# Patient Record
Sex: Female | Born: 1935
Health system: Southern US, Community
[De-identification: ages and names within clinical notes are randomized; demographics above are authoritative.]

## PROBLEM LIST (undated history)

## (undated) DIAGNOSIS — G709 Myoneural disorder, unspecified: Secondary | ICD-10-CM

## (undated) DIAGNOSIS — IMO0002 Reserved for concepts with insufficient information to code with codable children: Secondary | ICD-10-CM

## (undated) DIAGNOSIS — I10 Essential (primary) hypertension: Secondary | ICD-10-CM

## (undated) DIAGNOSIS — M109 Gout, unspecified: Secondary | ICD-10-CM

## (undated) DIAGNOSIS — Z0279 Encounter for issue of other medical certificate: Secondary | ICD-10-CM

## (undated) DIAGNOSIS — C4491 Basal cell carcinoma of skin, unspecified: Secondary | ICD-10-CM

## (undated) DIAGNOSIS — Z8742 Personal history of other diseases of the female genital tract: Secondary | ICD-10-CM

## (undated) DIAGNOSIS — K5792 Diverticulitis of intestine, part unspecified, without perforation or abscess without bleeding: Secondary | ICD-10-CM

## (undated) DIAGNOSIS — K8020 Calculus of gallbladder without cholecystitis without obstruction: Secondary | ICD-10-CM

## (undated) DIAGNOSIS — R319 Hematuria, unspecified: Secondary | ICD-10-CM

## (undated) DIAGNOSIS — H269 Unspecified cataract: Secondary | ICD-10-CM

## (undated) DIAGNOSIS — H9190 Unspecified hearing loss, unspecified ear: Secondary | ICD-10-CM

## (undated) DIAGNOSIS — C50412 Malignant neoplasm of upper-outer quadrant of left female breast: Secondary | ICD-10-CM

## (undated) DIAGNOSIS — E215 Disorder of parathyroid gland, unspecified: Secondary | ICD-10-CM

## (undated) DIAGNOSIS — M1990 Unspecified osteoarthritis, unspecified site: Secondary | ICD-10-CM

## (undated) DIAGNOSIS — M169 Osteoarthritis of hip, unspecified: Secondary | ICD-10-CM

## (undated) DIAGNOSIS — I340 Nonrheumatic mitral (valve) insufficiency: Secondary | ICD-10-CM

## (undated) DIAGNOSIS — I351 Nonrheumatic aortic (valve) insufficiency: Secondary | ICD-10-CM

## (undated) DIAGNOSIS — E559 Vitamin D deficiency, unspecified: Secondary | ICD-10-CM

## (undated) DIAGNOSIS — N814 Uterovaginal prolapse, unspecified: Secondary | ICD-10-CM

## (undated) DIAGNOSIS — Z9289 Personal history of other medical treatment: Secondary | ICD-10-CM

## (undated) DIAGNOSIS — K5730 Diverticulosis of large intestine without perforation or abscess without bleeding: Secondary | ICD-10-CM

## (undated) DIAGNOSIS — D126 Benign neoplasm of colon, unspecified: Secondary | ICD-10-CM

## (undated) DIAGNOSIS — R011 Cardiac murmur, unspecified: Secondary | ICD-10-CM

## (undated) DIAGNOSIS — T7840XA Allergy, unspecified, initial encounter: Secondary | ICD-10-CM

## (undated) DIAGNOSIS — I5022 Chronic systolic (congestive) heart failure: Secondary | ICD-10-CM

## (undated) DIAGNOSIS — N289 Disorder of kidney and ureter, unspecified: Secondary | ICD-10-CM

## (undated) DIAGNOSIS — IMO0001 Reserved for inherently not codable concepts without codable children: Secondary | ICD-10-CM

## (undated) HISTORY — DX: Reserved for concepts with insufficient information to code with codable children: IMO0002

## (undated) HISTORY — DX: Unspecified hearing loss, unspecified ear: H91.90

## (undated) HISTORY — DX: Uterovaginal prolapse, unspecified: N81.4

## (undated) HISTORY — DX: Encounter for issue of other medical certificate: Z02.79

## (undated) HISTORY — DX: Malignant neoplasm of upper-outer quadrant of left female breast: C50.412

## (undated) HISTORY — DX: Reserved for inherently not codable concepts without codable children: IMO0001

## (undated) HISTORY — DX: Diverticulitis of intestine, part unspecified, without perforation or abscess without bleeding: K57.92

## (undated) HISTORY — DX: Benign neoplasm of colon, unspecified: D12.6

## (undated) HISTORY — DX: Personal history of other medical treatment: Z92.89

## (undated) HISTORY — DX: Hematuria, unspecified: R31.9

## (undated) HISTORY — PX: DILATION AND CURETTAGE OF UTERUS: SHX78

## (undated) HISTORY — PX: TONSILLECTOMY AND ADENOIDECTOMY: SUR1326

## (undated) HISTORY — DX: Nonrheumatic aortic (valve) insufficiency: I35.1

## (undated) HISTORY — DX: Unspecified osteoarthritis, unspecified site: M19.90

## (undated) HISTORY — DX: Essential (primary) hypertension: I10

## (undated) HISTORY — DX: Calculus of gallbladder without cholecystitis without obstruction: K80.20

## (undated) HISTORY — PX: CHOLECYSTECTOMY: SHX55

## (undated) HISTORY — DX: Hypercalcemia: E83.52

## (undated) HISTORY — DX: Vitamin D deficiency, unspecified: E55.9

## (undated) HISTORY — DX: Disorder of kidney and ureter, unspecified: N28.9

## (undated) HISTORY — DX: Personal history of other diseases of the female genital tract: Z87.42

## (undated) HISTORY — DX: Allergy, unspecified, initial encounter: T78.40XA

## (undated) HISTORY — DX: Osteoarthritis of hip, unspecified: M16.9

## (undated) HISTORY — DX: Nonrheumatic mitral (valve) insufficiency: I34.0

## (undated) HISTORY — DX: Basal cell carcinoma of skin, unspecified: C44.91

## (undated) HISTORY — PX: TOTAL VAGINAL HYSTERECTOMY: SHX2548

## (undated) HISTORY — DX: Unspecified cataract: H26.9

## (undated) HISTORY — DX: Gout, unspecified: M10.9

## (undated) HISTORY — DX: Chronic systolic (congestive) heart failure: I50.22

## (undated) HISTORY — DX: Diverticulosis of large intestine without perforation or abscess without bleeding: K57.30

---

## 1969-10-20 DIAGNOSIS — Z9289 Personal history of other medical treatment: Secondary | ICD-10-CM

## 1969-10-20 HISTORY — DX: Personal history of other medical treatment: Z92.89

## 1999-03-22 ENCOUNTER — Other Ambulatory Visit: Admission: RE | Admit: 1999-03-22 | Discharge: 1999-03-22 | Payer: Self-pay | Admitting: *Deleted

## 1999-04-09 ENCOUNTER — Other Ambulatory Visit: Admission: RE | Admit: 1999-04-09 | Discharge: 1999-04-09 | Payer: Self-pay | Admitting: *Deleted

## 1999-04-09 ENCOUNTER — Encounter (INDEPENDENT_AMBULATORY_CARE_PROVIDER_SITE_OTHER): Payer: Self-pay | Admitting: Specialist

## 1999-04-30 ENCOUNTER — Encounter (INDEPENDENT_AMBULATORY_CARE_PROVIDER_SITE_OTHER): Payer: Self-pay

## 1999-04-30 ENCOUNTER — Other Ambulatory Visit: Admission: RE | Admit: 1999-04-30 | Discharge: 1999-04-30 | Payer: Self-pay | Admitting: *Deleted

## 1999-09-09 ENCOUNTER — Other Ambulatory Visit: Admission: RE | Admit: 1999-09-09 | Discharge: 1999-09-09 | Payer: Self-pay | Admitting: *Deleted

## 2000-03-04 ENCOUNTER — Other Ambulatory Visit: Admission: RE | Admit: 2000-03-04 | Discharge: 2000-03-04 | Payer: Self-pay | Admitting: *Deleted

## 2000-05-28 ENCOUNTER — Encounter: Payer: Self-pay | Admitting: General Surgery

## 2000-05-28 ENCOUNTER — Ambulatory Visit (HOSPITAL_COMMUNITY): Admission: RE | Admit: 2000-05-28 | Discharge: 2000-05-28 | Payer: Self-pay | Admitting: General Surgery

## 2000-06-08 ENCOUNTER — Encounter (INDEPENDENT_AMBULATORY_CARE_PROVIDER_SITE_OTHER): Payer: Self-pay | Admitting: Specialist

## 2000-06-08 ENCOUNTER — Ambulatory Visit (HOSPITAL_BASED_OUTPATIENT_CLINIC_OR_DEPARTMENT_OTHER): Admission: RE | Admit: 2000-06-08 | Discharge: 2000-06-08 | Payer: Self-pay | Admitting: General Surgery

## 2000-08-10 ENCOUNTER — Other Ambulatory Visit: Admission: RE | Admit: 2000-08-10 | Discharge: 2000-08-10 | Payer: Self-pay | Admitting: *Deleted

## 2001-08-12 ENCOUNTER — Other Ambulatory Visit: Admission: RE | Admit: 2001-08-12 | Discharge: 2001-08-12 | Payer: Self-pay | Admitting: *Deleted

## 2001-12-03 ENCOUNTER — Encounter: Payer: Self-pay | Admitting: General Surgery

## 2001-12-06 ENCOUNTER — Encounter (INDEPENDENT_AMBULATORY_CARE_PROVIDER_SITE_OTHER): Payer: Self-pay | Admitting: Specialist

## 2001-12-06 ENCOUNTER — Observation Stay (HOSPITAL_COMMUNITY): Admission: RE | Admit: 2001-12-06 | Discharge: 2001-12-06 | Payer: Self-pay | Admitting: General Surgery

## 2002-11-17 ENCOUNTER — Other Ambulatory Visit: Admission: RE | Admit: 2002-11-17 | Discharge: 2002-11-17 | Payer: Self-pay | Admitting: *Deleted

## 2003-10-18 ENCOUNTER — Encounter: Admission: RE | Admit: 2003-10-18 | Discharge: 2003-10-18 | Payer: Self-pay | Admitting: Family Medicine

## 2003-11-22 ENCOUNTER — Other Ambulatory Visit: Admission: RE | Admit: 2003-11-22 | Discharge: 2003-11-22 | Payer: Self-pay | Admitting: *Deleted

## 2004-12-05 ENCOUNTER — Other Ambulatory Visit: Admission: RE | Admit: 2004-12-05 | Discharge: 2004-12-05 | Payer: Self-pay | Admitting: *Deleted

## 2005-02-14 ENCOUNTER — Encounter: Admission: RE | Admit: 2005-02-14 | Discharge: 2005-02-14 | Payer: Self-pay | Admitting: Family Medicine

## 2005-12-11 ENCOUNTER — Other Ambulatory Visit: Admission: RE | Admit: 2005-12-11 | Discharge: 2005-12-11 | Payer: Self-pay | Admitting: Obstetrics & Gynecology

## 2007-02-11 ENCOUNTER — Other Ambulatory Visit: Admission: RE | Admit: 2007-02-11 | Discharge: 2007-02-11 | Payer: Self-pay | Admitting: Obstetrics & Gynecology

## 2007-03-29 ENCOUNTER — Ambulatory Visit (HOSPITAL_COMMUNITY): Admission: RE | Admit: 2007-03-29 | Discharge: 2007-03-30 | Payer: Self-pay | Admitting: Obstetrics & Gynecology

## 2007-03-29 ENCOUNTER — Encounter: Payer: Self-pay | Admitting: Obstetrics & Gynecology

## 2007-07-19 ENCOUNTER — Ambulatory Visit: Payer: Self-pay | Admitting: Internal Medicine

## 2007-07-30 ENCOUNTER — Ambulatory Visit: Payer: Self-pay | Admitting: Internal Medicine

## 2007-07-30 ENCOUNTER — Encounter: Payer: Self-pay | Admitting: Internal Medicine

## 2008-12-06 ENCOUNTER — Other Ambulatory Visit: Admission: RE | Admit: 2008-12-06 | Discharge: 2008-12-06 | Payer: Self-pay | Admitting: Radiology

## 2010-08-20 HISTORY — PX: CYSTOCELE REPAIR: SHX163

## 2010-09-17 ENCOUNTER — Observation Stay (HOSPITAL_COMMUNITY)
Admission: RE | Admit: 2010-09-17 | Discharge: 2010-09-18 | Payer: Self-pay | Source: Home / Self Care | Admitting: Urology

## 2010-12-31 LAB — BASIC METABOLIC PANEL
CO2: 26 mEq/L (ref 19–32)
Chloride: 107 mEq/L (ref 96–112)
Chloride: 109 mEq/L (ref 96–112)
GFR calc Af Amer: 55 mL/min — ABNORMAL LOW (ref >60)
GFR calc Af Amer: 56 mL/min — ABNORMAL LOW (ref >60)
Glucose, Bld: 103 mg/dL — ABNORMAL HIGH (ref 70–99)
Potassium: 4.3 mEq/L (ref 3.5–5.1)
Sodium: 141 mEq/L (ref 135–145)

## 2010-12-31 LAB — CBC
HCT: 46.1 % — ABNORMAL HIGH (ref 36.0–46.0)
Hemoglobin: 16.3 g/dL — ABNORMAL HIGH (ref 12.0–15.0)
MCHC: 35.3 g/dL (ref 30.0–36.0)
MCV: 88.8 fL (ref 78.0–100.0)
RDW: 13.3 % (ref 11.5–15.5)

## 2010-12-31 LAB — COMPREHENSIVE METABOLIC PANEL
ALT: 21 U/L (ref 0–35)
Albumin: 4 g/dL (ref 3.5–5.2)
Alkaline Phosphatase: 59 U/L (ref 39–117)
Chloride: 103 mEq/L (ref 96–112)
Glucose, Bld: 98 mg/dL (ref 70–99)
Potassium: 5.1 mEq/L (ref 3.5–5.1)
Sodium: 139 mEq/L (ref 135–145)
Total Bilirubin: 0.7 mg/dL (ref 0.3–1.2)
Total Protein: 7.6 g/dL (ref 6.0–8.3)

## 2010-12-31 LAB — SURGICAL PCR SCREEN: MRSA, PCR: NEGATIVE

## 2010-12-31 LAB — HEMOGLOBIN AND HEMATOCRIT, BLOOD
HCT: 37.8 % (ref 36.0–46.0)
HCT: 41.8 % (ref 36.0–46.0)
Hemoglobin: 13.1 g/dL (ref 12.0–15.0)

## 2010-12-31 LAB — PROTIME-INR: INR: 0.9 (ref 0.00–1.49)

## 2010-12-31 LAB — ABO/RH: ABO/RH(D): O POS

## 2011-03-04 LAB — BASIC METABOLIC PANEL
CO2: 32 mEq/L (ref 19–32)
Calcium: 11.4 mg/dL — ABNORMAL HIGH (ref 8.4–10.5)
Chloride: 101 mEq/L (ref 96–112)
GFR calc Af Amer: 51 mL/min — ABNORMAL LOW (ref >60)
Potassium: 5 mEq/L (ref 3.5–5.1)
Sodium: 140 mEq/L (ref 135–145)

## 2011-03-04 LAB — CBC
Hemoglobin: 15.2 g/dL — ABNORMAL HIGH (ref 12.0–15.0)
MCHC: 33.2 g/dL (ref 30.0–36.0)
RBC: 5.17 MIL/uL — ABNORMAL HIGH (ref 3.87–5.11)
WBC: 8.8 10*3/uL (ref 4.0–10.5)

## 2011-03-04 LAB — PROTIME-INR: INR: 0.9 (ref 0.00–1.49)

## 2011-03-04 LAB — APTT: aPTT: 34 seconds (ref 24–37)

## 2011-03-06 ENCOUNTER — Ambulatory Visit (HOSPITAL_BASED_OUTPATIENT_CLINIC_OR_DEPARTMENT_OTHER)
Admission: RE | Admit: 2011-03-06 | Discharge: 2011-03-06 | Disposition: A | Discharge status: Home / Self Care | Payer: Medicare Other | Source: Ambulatory Visit | Attending: Urology | Admitting: Urology

## 2011-03-06 DIAGNOSIS — Z01812 Encounter for preprocedural laboratory examination: Secondary | ICD-10-CM | POA: Insufficient documentation

## 2011-03-06 DIAGNOSIS — N393 Stress incontinence (female) (male): Secondary | ICD-10-CM | POA: Insufficient documentation

## 2011-03-06 DIAGNOSIS — Z79899 Other long term (current) drug therapy: Secondary | ICD-10-CM | POA: Insufficient documentation

## 2011-03-06 DIAGNOSIS — I10 Essential (primary) hypertension: Secondary | ICD-10-CM | POA: Insufficient documentation

## 2011-03-06 LAB — TYPE AND SCREEN: ABO/RH(D): O POS

## 2011-03-07 NOTE — Op Note (Signed)
NAME:  Carla Bennett, Carla Bennett            ACCOUNT NO.:  [id number]   MEDICAL RECORD NO.:  [id number]          PATIENT TYPE:  AMB   LOCATION:  SDC                           FACILITY:  [facility]   PHYSICIAN:  M. Edwinna Areola, MD  DATE OF BIRTH:  1934-12-30   DATE OF PROCEDURE:  03/29/2007  DATE OF DISCHARGE:                               OPERATIVE REPORT   PREOPERATIVE DIAGNOSES:  47. A 76 year old, gravida 25, para 46, married white female with      symptomatic uterine prolapse.  2. No significant cystocele or rectocele in clinic no stress urinary      incontinence history.  3. History of hypertension.  4. History of normal spontaneous vaginal delivery NSVD x4.  5. History of laparoscopic cholecystectomy in 2003.  6. Remote history of dilatation and curettage with transfusion x1 in      the 1970s.  7. Uterine fibroids.   POSTOPERATIVE DIAGNOSES:  51. A 76 year old, gravida 56, para 51, married white female with      symptomatic uterine prolapse.  2. No significant cystocele or rectocele in clinic no stress urinary      incontinence history.  3. History of hypertension.  4. History of normal spontaneous vaginal delivery NSVD x4.  5. History of laparoscopic cholecystectomy in 2003.  6. Remote history of dilatation and curettage with transfusion x1 in      the 1970s.  7. Uterine fibrosis.   DESCRIPTION OF PROCEDURE:  Transvaginal hysterectomy.   SURGEON:  M. Edwinna Areola, M.D.   ANESTHESIA:  General endotracheal.   SPECIMENS:  Uterus and cervix to pathology.   ESTIMATED BLOOD LOSS:  50 mL.   IV FLUIDS:  Fluids 49 mL of LR.   URINARY OUTPUT:  100 mL of clear urine drained with I&O catheterization  beginning at the end of the procedure.   COMPLICATIONS:  None.   INDICATIONS:  Mrs. Helvey was a very pleasant 76 year old G6, P4,  married white female who has had worsening pelvic prolapse over the last  2-3 years.  She is now feeling her uterus and vagina are regular basis.  On exam in  clinic, she did not have a significant cystocele or  rectocele, just basic uterine descensus.  The patient has no  incontinence symptoms at all.  Because of this I have decided to go  ahead and proceed with a transvaginal hysterectomy without any bladder  repair.  The patient requests if her ovaries with normal to leave the  place.  The patient has been consented in clinic.  Risks and benefits  were discussed with her and she has had all questions answered and we  are ready to proceed.   PROCEDURE:  Patient taken to the operating room.  She placed in supine  position.  Anesthesia is administered by the anesthesiologist without  difficulty.  Legs were positioned in the [place].  Care was taken  to ensure that there is no pressure on the outer portion of the legs.  Shoulder,  breast, and knees are in line. The SCDs are in placed  bilaterally on the lower extremities.  The abdomen, perineum,  inner  thighs, and vagina are prepped in a normal sterile fashion.  An in-and-  out Foley catheterization is performed to drain the bladder of all  urine.  The patient is then draped in a normal sterile fashion and the  legs are positioned in the high lithotomy position.   A heavy weighted speculum was placed in the vagina and cervix was  grasped with a Jacobson tenaculum.  The cervix with traction pulls out  to the level of the vagina.  Marcaine 0.5%, 10 mL, mixed with 1%  epinephrine (1:200,000 units) was instilled circumferentially around the  cervix.  The posterior cul-de-sac is entered sharply and the posterior  vaginal mucosa and posterior peritoneum are tacked with figure-of-eight  suture of #0 Vicryl.  The cervix is then circumferentially incised with  cautery.  The pubovesical cervical fascia anteriorly of the cervix is  then dissected sharply with Mayo scissors.  With an open Ray-Tec, the  bladder was advanced anteriorly up the cervix until the anterior  peritoneum is visualized.    At this point the uterosacral ligaments were clamped bilaterally with  Heaney clamps, transected and sutured with a Heaney stitch of #0 Vicryl.  The beginning of the cardinal ligaments are clamped serially with care  taken to ensure that the anterior peritoneum reflection was visualized.  These pedicles were transected, suture ligated with a 0 Vicryl.  Once  two clamps were placed on each side of the cervix, attention is turned  back the anterior peritoneum.  This was elevated with an Allis clamp and  incised sharply.  The Deaver retractor was placed through this incision.  The top the uterus is well visualized, as well as the bowel.  At this  point, the remainder of the cardinal ligaments were serially clamped,  transected, suture ligated with 0 Vicryl.  Once the uterine artery was  clamped on each side, the pedicles were transected and then doubly  suture ligated with 0 Vicryl.  At this point, the posterior aspect of  the uterine fundus could be grasped with single-tooth tenaculum and  delivered through the posterior aspect of the vaginal opening.  The  uterine ovarian pedicles were then clamped with curved Heaney clamp.  Care was taken to sure no bowel was in this clamp.  This was done  bilaterally without difficulty.  Pedicles were transected.  The uterus  and cervix were delivered through the vagina and handed off to be sent  to pathology.  The utero-ovarian pedicles were then suture ligated with  a stitch tie of #0 Vicryl.  Pedicles were hemostatic.  The ovaries were  visualized.  They were high and atrophic-appearing.  The patient wished  to keep them if possible and so we did not do an oophorectomy at this  point.  The pedicles had been previously transected and sutured and  visualized.  No bleeding was noted.  A moistened open lap tape was  placed in the vaginal opening, pushed the bowel anteriorly.  At this  point the cuff was run using a long number0Vicryl.  This stitch was   started at the 2 o'clock position on the vaginal mucosa.  The anterior  vaginal mucosa and anterior peritoneum were incorporated into the  stitch.  A running interlocking stitch was used.  The stitch was brought  around to the posterior aspect of the vaginal mucosa and the vaginal  mucosa and vaginal peritoneum were incorporated and this stitch was  carried along back to about the 10 o'clock  position on the cervix.  This  was tied tightly.   At this point, am enterocele stitch was placed.  This stich was placed  through the posterior vaginal mucosa.  The medial third of the  uterosacral ligament on the left side was incorporated and the posterior  peritoneal tissue was also incorporated by reefing across.  The medial  third of the right uterosacral ligament was incorporated into the  stitch.  The stitch was brought back to the vaginal mucosa and tied  tightly bring uterosacral ligaments and the vaginal tissue together.  Then the vaginal cuff was closed with three interrupted figure-of-eight  sutures of #0 Vicryl.  The tissue was irrigated.  No bleeding was noted.  The in-and-out Foley catheterization was drained used to drain the  bladder of all urine at this point.  This urine was clear.   Sponge, lap, needle and instrument counts were correct x2.  The patient  tolerated the procedure well.  Her legs were brought back to the low  lithotomy position and she was brought to the supine position before  being awakened from anesthesia.  She was extubated without difficulty  and taken to the recovery room in stable condition.      Felipa Emory, MD  Electronically Signed     MSM/MEDQ  D:  03/29/2007  T:  03/29/2007  Job:  [id number]

## 2011-03-07 NOTE — Op Note (Signed)
[facility]  Patient:    Carla Bennett, Carla Bennett Visit Number: [id number] MRN: [id number]          Service Type: DSU Location: DAY Attending Physician:  Barbera Setters Proc. Date: 12/06/01 Admit Date:  12/06/2001   CC:         Russ Halo. Sheryn Bison, M.D.   Operative Report  PREOPERATIVE DIAGNOSIS:  Chronic cholecystitis with cholelithiasis.  POSTOPERATIVE DIAGNOSIS:  Chronic cholecystitis with cholelithiasis.  PROCEDURE PERFORMED:  Laparoscopic cholecystectomy.  SURGEON:  Edsel Petrin. Dalbert Batman, M.D.  ASSISTANT:  Shellia Carwin, M.D.  INDICATION:  This is a 76 year old white female in good health.  She has had a two or three year history of intermittent episodes of epigastric pain, back pain, nausea and vomiting.  This is becoming more frequent now.  The episodes tend to occur after meals.  She is on Protonix now and the symptoms are a little bit better, but she continues to have daily epigastric discomfort, back discomfort, and bloating.  A gallbladder ultrasound shows multiple gallstones. The gallbladder wall was not thickened, and the common bile duct measures only 3.7 mm.  Liver function tests are normal.  She is brought to the operating room electively for cholecystectomy.  FINDINGS:  The gallbladder was chronically inflamed and somewhat discolored, but thin walled.  The anatomy of the cystic duct, cystic artery, and common bile duct appeared conventional.  The liver, stomach, duodenum, small intestine, large intestine, and peritoneal surfaces looked normal.  DESCRIPTION OF PROCEDURE:  Following the induction of general endotracheal anesthesia, the patients abdomen was prepped and draped in a sterile fashion. Marcaine 0.5% with epinephrine was used as a local infiltration anesthetic.  A vertically oriented incision was made inside the lower rim of the umbilicus. The fascia was incised in the midline and the abdominal cavity entered  under direct vision.  A 10 mm Hasson trocar was inserted and secured with a pursestring suture of 0 Vicryl.  Pneumoperitoneum was created.  A video camera was inserted with visualization and findings as described above.  The 10 mm trocar was placed in the subxiphoid region and two 5 mm trocars were placed in the right mid abdomen.  The gallbladder was elevated and placed on traction. Adhesions were taken down.  We carefully dissected out the cystic duct and the cystic artery.  The cystic artery was isolated as it went on to the gallbladder wall, secured with metal clips and divided.  A large window was created behind the cystic duct until we could clearly identify the junction of the cystic duct with the infundibulum of the gallbladder and clearly identified the region of the common bile duct.  The cystic duct was tiny in caliber, was secured with metal clips, and divided.  The gallbladder was then dissected from its bed with electrocautery.  That dissection was uneventful. Hemostasis was excellent and achieved with electrocautery.  The gallbladder was removed through the umbilical port.  The operative field and the subphrenic space were irrigated with saline.  The irrigation fluid was completely clear at the completion of the case, and there was no bleeding, and no bile leak whatsoever.  The trocars were removed under direct vision and there was no bleeding from the trocar sites.  The pneumoperitoneum was released.  The fascia at the umbilicus was closed with 0 Vicryl suture.  The skin incisions were closed with subcuticular sutures of 4-0 Vicryl and Steri-Strips.  Clean bandages were placed and the patient was taken to the recovery  room in stable condition.  Estimated blood loss was about 10 cc. Complications none.  Sponge, needle, and instrument counts were correct. Attending Physician:  Barbera Setters DD:  12/06/01 TD:  12/06/01 Job: [id number]

## 2011-03-07 NOTE — Op Note (Signed)
[facility]  Patient:    Carla Bennett, Carla Bennett                   MRN: [id number] Proc. Date: 06/08/00 Adm. Date:  [id number] Attending:  Barbera Setters CC:         Russ Halo. Sheryn Bison, M.D.                           Operative Report  PREOPERATIVE DIAGNOSIS:  Chronically infected apocrine gland cyst, right groin.  POSTOPERATIVE DIAGNOSIS:  Chronically infected apocrine gland cyst, right groin.  OPERATION PERFORMED:  Excision of chronically inflamed cyst, right groin/perineum.  SURGEON:  Edsel Petrin. Dalbert Batman, M.D.  ANESTHESIA:  INDICATIONS FOR PROCEDURE:  This is a 76 year old white female with a two-year history of intermittent swelling, pain and drainage in her right groin.  She has not had any prior problems.  She is not diabetic.  This is beginning to interfere with her work and her sporting activities with bowling.  Examination reveals in the inner crease of the thigh lateral to the labia majora there is a chronically infected cyst.  There is a chronic dimpled scar and there is also a chronically inflamed ridge just inferior to this.  It is thickened in this area but there is not active drainage and there is not active cellulitis.   DESCRIPTION OF PROCEDURE:  The patient was brought to the operating room.  She was monitored and heavily sedated by the anesthesia department.  She was placed in the modified dorsal lithotomy position.  The right groin was prepped and draped in sterile fashion.  I basically performed a narrow, elliptical incision in the right inner thigh crease between the thigh and the labia majora.  Dissection was carried down into the deep subcutaneous tissues where we excised all the chronically inflamed and thickened tissue.  There was no abscess and no purulence.  The tissue was sent for routine histology. Hemostasis was excellent and achieved with electrocautery.  The wound was irrigated with saline.  The skin was  closed with running subcuticular suture of 4-0 Vicryl and Steri-Strips.  Clean bandage and ice pack were placed.  The patient tolerated the procedure well and was taken to the recovery room in stable condition.  Estimated blood loss was about 10 cc.  Complications were none.  Sponge, needle and instrument counts were correct. DD:  06/08/00 TD:  06/08/00 Job: [id number]

## 2011-03-07 NOTE — Discharge Summary (Signed)
NAME:  Carla Bennett, Carla Bennett            ACCOUNT NO.:  [id number]   MEDICAL RECORD NO.:  [id number]          PATIENT TYPE:  OIB   LOCATION:  9317                          FACILITY:  [facility]   PHYSICIAN:  M. Edwinna Areola, MD  DATE OF BIRTH:  1934-10-25   DATE OF ADMISSION:  03/29/2007  DATE OF DISCHARGE:  03/30/2007                               DISCHARGE SUMMARY   PROCEDURE:  Transvaginal hysterectomy.   ADMISSION DIAGNOSES:  76. 76 year old G6, P 4 married white female with symptomatic uterine      prolapse.  2. History of hypertension.  3. History of normal spontaneous vaginal delivery x4.  4. History of laparoscopic cholecystectomy in 2003.  5. History D&C with transfusion in the 1970s due to bleeding.  6. Small uterine fibroids.   DISCHARGE DIAGNOSES:  54. 76 year old G6, P 4 married white female with symptomatic uterine      prolapse.  2. History of hypertension.  3. History of normal spontaneous vaginal delivery x4.  4. History of laparoscopic cholecystectomy in 2003.  5. History D&C with transfusion in the 1970s due to bleeding.  6. Small uterine fibroids.   HISTORY OF PRESENT ILLNESS:  Written H&P is in the chart but in brief  Carla Bennett is very pleasant 76 year old G6 P4 married white female who  has had worsening pelvic prolapse over the last several years.  She has  constant feeling of her uterus in the vagina and feeling of vaginal and  bladder pressure.  On exam while awake.  She does not have a significant  cystocele or rectocele and just uterine descensus.  She can cough with  the base of cervix just inside the vaginal opening on the exam.  She had  discussed conservative and surgical options and she has decided to go  ahead and proceed with transvaginal hysterectomy.  Because she does not  have any incontinence and she does not have any significant cystocele on  physical exam in clinic or with Valsalva maneuver, she and I have  decided not to do anything else in  regards to her bladder.  She is also  interested in keeping her ovaries.   HOSPITAL COURSE:  The patient was admitted through same-day surgery  where she was taken to the operating room.  TVH was performed without  difficulty.  The patient had 50 mL of blood loss during the procedure  and she made appropriate urine during the procedure.  She was taken from  the OR after the procedure ended to the recovery room and after the time  in recovery room to the third floor where she remained for the rest of  postoperative time period. In the evening of postoperative day 0 the  patient was doing very, very well.  She was ambulating.  She already  voided, she was eating regular food.  She had stable vital signs. We  discussed the possibility of going home but because she is 76 I felt it  prudent to keep her overnight and do blood work in the morning.  She had  an uneventful evening and in the morning of postoperative day #1  she had  passed some flatus.  She was tolerating pain medicines by mouth.  She  had no trouble with breakfast.  Her hemoglobin postoperative was 14.2.  White count was 13.4, platelet count 337. Electrolytes were normal.  Her  sodium was 139, potassium was 4.1, glucose 121, BUN and creatinine were  7 and 1.0. These actually very stable from before the surgery with a GFR  of 54.  Again this was stable as her preoperative GFR had been 48.   As the patient was doing very well I felt discharge was appropriate.   DISCHARGE INSTRUCTIONS:  The instructions were provided in the written  and verbal form.  The patient be seen in 1 week in my clinic. She has  been given postop prescription for Percocet 5/325 to be taken 1-2  tablets p.o. q.4-6 hours p.r.n. and Motrin 800 mg be taken every 8 hours  as needed.  She is not to do any heavy lifting over the next 6 weeks.  She has been given very specific instructions what she can and cannot  do.  The patient was discharged to home with her  husband in stable  condition.      Carla Emory, MD  Electronically Signed     MSM/MEDQ  D:  04/27/2007  T:  04/27/2007  Job:  [id number]

## 2011-03-17 NOTE — Op Note (Signed)
NAME:  Carla Bennett, Carla Bennett            ACCOUNT NO.:  [id number]  MEDICAL RECORD NO.:  [id number]          PATIENT TYPE:  OBV  LOCATION:  54                         FACILITY:  [facility]  PHYSICIAN:  Reece Packer, MD DATE OF BIRTH:  02-20-1935  DATE OF PROCEDURE:  03/06/2011 DATE OF DISCHARGE:                              OPERATIVE REPORT   PREOPERATIVE DIAGNOSIS:  Stress urinary incontinence.  POSTOPERATIVE DIAGNOSIS:  Stress urinary incontinence.  PROCEDURE:  Sling, cystourethropexy [facility]) plus cystoscopy.  INDICATIONS FOR PROCEDURE:  Ms. Ercilia Resch had a successful prolapse repair with cystocele repair and rectocele repair.  She had postoperative stress incontinence and consented with the above procedure.  PROCEDURE IN DETAIL:  The patient was prepped and draped in the usual fashion.  Extra care was taken with leg positioning to minimize the risk of compartment syndrome, neuropathy and DVT.  Preoperative antibiotics were given.  Preoperative laboratory tests were normal.  She had a grade 1 cystocele under anesthesia with good vaginal length and good support posteriorly.  I made two less than 1-cm incisions one fingerbreadth above the symphysis pubis 1.5 cm lateral to the midline.  I made a 2-cm suburethral incision of appropriate depth after instilling 7 cc of a lidocaine/epinephrine mixture.  I sharply dissected to the urethrovesical angle bilaterally.  She had two large folds of tissue almost like camel humps just to the left and right of the midline incision.  It made visibility a little bit more difficult.  They did not represent urethral diverticula, in my opinion.  After this dissection, I had an extra look at the urethra and the urethra was healthy and the incision was appropriate depth.  With the bladder emptied, I passed a SPARC needle along the top and along the back of the symphysis pubis, staying laterally with my box technique, turning the trocar under  the pulp of my index finger bilaterally.  I cystoscoped the patient at this point.  There was no bladder or urethral injury.  I had a good look in the urethra as well. There was excellent blue jets bilaterally.  There was no movement of the bladder with movement of the trocar.  When the bladder was empty, I attached a Henderson sling and brought it up through the retropubic space and tensioned it over the fat part of a moderate-sized Kelly clamp.  I cut below the blue dots, irrigated the sheath and removed the sheath.  There was appropriate hypermobility of the sling.  There was no springback effect.  I was very happy with the position and the tension of the sling.  After irrigating all incisions, I closed the anterior vaginal wall with 2-0 Vicryl on a CT-1 needle, being meticulous because of the redundant vaginal mucosa.  I did my two interrupted sutures as well.  I cut the sling below the skin and closed the two abdominal incisions with interrupted 4-0 Vicryl followed by Dermabond.  Leg positioning was good at the end of the case.  Blue urine was draining from her bladder.  Hopefully she will reach her treatment goal.          ______________________________ Elayne Snare  Yanelli Zapanta, MD     SAM/MEDQ  D:  03/06/2011  T:  03/06/2011  Job:  [id number]  Electronically Signed by Bjorn Loser MD on 03/17/2011 06:13:25 PM

## 2011-08-07 LAB — CBC
Hemoglobin: 16.8 — ABNORMAL HIGH
MCHC: 33.7
MCHC: 33.7
MCV: 88.1
Platelets: 337
RBC: 5.64 — ABNORMAL HIGH
RDW: 12.3

## 2011-08-07 LAB — BASIC METABOLIC PANEL
CO2: 27
CO2: 29
Calcium: 9.5
Chloride: 104
Creatinine, Ser: 1.01
Creatinine, Ser: 1.13
GFR calc Af Amer: 58 — ABNORMAL LOW
GFR calc Af Amer: >60
Glucose, Bld: 121 — ABNORMAL HIGH

## 2011-12-16 DIAGNOSIS — N6009 Solitary cyst of unspecified breast: Secondary | ICD-10-CM | POA: Diagnosis not present

## 2011-12-17 DIAGNOSIS — I10 Essential (primary) hypertension: Secondary | ICD-10-CM | POA: Diagnosis not present

## 2011-12-17 DIAGNOSIS — E210 Primary hyperparathyroidism: Secondary | ICD-10-CM | POA: Diagnosis not present

## 2011-12-17 DIAGNOSIS — E559 Vitamin D deficiency, unspecified: Secondary | ICD-10-CM | POA: Diagnosis not present

## 2011-12-17 DIAGNOSIS — J309 Allergic rhinitis, unspecified: Secondary | ICD-10-CM | POA: Diagnosis not present

## 2012-01-26 DIAGNOSIS — E559 Vitamin D deficiency, unspecified: Secondary | ICD-10-CM | POA: Diagnosis not present

## 2012-01-26 DIAGNOSIS — Z0000 Encounter for general adult medical examination without abnormal findings: Secondary | ICD-10-CM | POA: Diagnosis not present

## 2012-01-26 DIAGNOSIS — E669 Obesity, unspecified: Secondary | ICD-10-CM | POA: Diagnosis not present

## 2012-01-26 DIAGNOSIS — I10 Essential (primary) hypertension: Secondary | ICD-10-CM | POA: Diagnosis not present

## 2012-02-04 DIAGNOSIS — J309 Allergic rhinitis, unspecified: Secondary | ICD-10-CM | POA: Diagnosis not present

## 2012-02-04 DIAGNOSIS — I10 Essential (primary) hypertension: Secondary | ICD-10-CM | POA: Diagnosis not present

## 2012-02-04 DIAGNOSIS — E559 Vitamin D deficiency, unspecified: Secondary | ICD-10-CM | POA: Diagnosis not present

## 2012-03-18 DIAGNOSIS — E210 Primary hyperparathyroidism: Secondary | ICD-10-CM | POA: Diagnosis not present

## 2012-03-30 DIAGNOSIS — I10 Essential (primary) hypertension: Secondary | ICD-10-CM | POA: Diagnosis not present

## 2012-03-30 DIAGNOSIS — E210 Primary hyperparathyroidism: Secondary | ICD-10-CM | POA: Diagnosis not present

## 2012-06-02 DIAGNOSIS — D235 Other benign neoplasm of skin of trunk: Secondary | ICD-10-CM | POA: Diagnosis not present

## 2012-06-02 DIAGNOSIS — L719 Rosacea, unspecified: Secondary | ICD-10-CM | POA: Diagnosis not present

## 2012-06-08 ENCOUNTER — Encounter: Payer: Self-pay | Admitting: Internal Medicine

## 2012-06-27 DIAGNOSIS — M109 Gout, unspecified: Secondary | ICD-10-CM | POA: Diagnosis not present

## 2012-08-03 DIAGNOSIS — H1044 Vernal conjunctivitis: Secondary | ICD-10-CM | POA: Diagnosis not present

## 2012-08-05 DIAGNOSIS — I10 Essential (primary) hypertension: Secondary | ICD-10-CM | POA: Diagnosis not present

## 2012-08-05 DIAGNOSIS — E559 Vitamin D deficiency, unspecified: Secondary | ICD-10-CM | POA: Diagnosis not present

## 2012-08-12 DIAGNOSIS — J309 Allergic rhinitis, unspecified: Secondary | ICD-10-CM | POA: Diagnosis not present

## 2012-08-12 DIAGNOSIS — Z01419 Encounter for gynecological examination (general) (routine) without abnormal findings: Secondary | ICD-10-CM | POA: Diagnosis not present

## 2012-08-12 DIAGNOSIS — Z124 Encounter for screening for malignant neoplasm of cervix: Secondary | ICD-10-CM | POA: Diagnosis not present

## 2012-08-12 DIAGNOSIS — M19049 Primary osteoarthritis, unspecified hand: Secondary | ICD-10-CM | POA: Diagnosis not present

## 2012-08-12 DIAGNOSIS — I10 Essential (primary) hypertension: Secondary | ICD-10-CM | POA: Diagnosis not present

## 2012-10-10 DIAGNOSIS — IMO0002 Reserved for concepts with insufficient information to code with codable children: Secondary | ICD-10-CM | POA: Diagnosis not present

## 2012-12-27 DIAGNOSIS — K5732 Diverticulitis of large intestine without perforation or abscess without bleeding: Secondary | ICD-10-CM | POA: Diagnosis not present

## 2013-01-05 ENCOUNTER — Telehealth: Payer: Self-pay | Admitting: Internal Medicine

## 2013-01-05 NOTE — Telephone Encounter (Signed)
Left a message for patient to call me. 

## 2013-01-05 NOTE — Telephone Encounter (Signed)
Patient states she had diverticulitis on 12/28/12 and was put on antibiotics. She states her stools are still soft. She reports she is due for colonoscopy also. Wants to be seen. Scheduled with Tye Savoy, NP on 01/06/13 at 11:00 AM.

## 2013-01-05 NOTE — Telephone Encounter (Signed)
Left a message with husband for patient to call me.

## 2013-01-06 ENCOUNTER — Ambulatory Visit (INDEPENDENT_AMBULATORY_CARE_PROVIDER_SITE_OTHER): Payer: Medicare Other | Admitting: Nurse Practitioner

## 2013-01-06 ENCOUNTER — Encounter: Payer: Self-pay | Admitting: *Deleted

## 2013-01-06 VITALS — BP 130/74 | HR 80 | Ht 60.05 in | Wt 161.0 lb

## 2013-01-06 DIAGNOSIS — K921 Melena: Secondary | ICD-10-CM

## 2013-01-06 DIAGNOSIS — R109 Unspecified abdominal pain: Secondary | ICD-10-CM | POA: Diagnosis not present

## 2013-01-06 MED ORDER — MOVIPREP 100 G PO SOLR: 1.0000 | Freq: Once | ORAL | Status: DC

## 2013-01-06 NOTE — Progress Notes (Signed)
HPI :  Patient is a 78 year old female known remotely to Dr. Olevia Perches for screening colonoscopy done October 2008. Findings included diverticulosis and a colon polyp. Patient is here today for evaluation of recent abdominal pain and hematochezia.  Ms. Gaut went to urgent care 12/27/12 with a one day history of diffuse lower abdominal pain, predominantly LLQ, and hematochezia. The pain started first followed by onset of hematochezia several hours later. She had associated chills. Urgent Care gave her Cipro / Flagyl which she took for 7 days for presumed diverticulitis. Blood work was taken but I don't yet have results. Bleeding continued for a couple of days . She averaged about 6 bloody BMs a day. Of note, patient had been significantly constipated two days prior to onset of symptoms. She didn't take anything for constipation. Patient has been feeling fine for a few days now except that stools are mushy. She is having 3-4 mushy BMs / a day. Her normal BMs consist of solid stool once daily. Her appetite is fine. She hasn't started any new medications (none that predate recent symptoms). Of note, patient reports a similar episode approximately 4 years ago for which she was never treated.  Patient was due for surveillance colonoscopy last August.   Past Medical History  Diagnosis Date  . Diverticulosis of colon (without mention of hemorrhage)   . Benign neoplasm of colon   . Diverticulosis   . Arthritis   . Gout   . Hypertension    Family History  Problem Relation Age of Onset  . Breast cancer Sister   . Lung cancer Father   . Parkinson's disease Mother   . Multiple sclerosis Sister    History  Substance Use Topics  . Smoking status: Never Smoker   . Smokeless tobacco: Never Used  . Alcohol Use: Yes     Comment: occasionally   Current Outpatient Prescriptions  Medication Sig Dispense Refill  . estrogens, conjugated, (PREMARIN) 0.3 MG tablet Take 0.3 mg by mouth daily. Take daily for 21  days then do not take for 7 days.      Marland Kitchen ibuprofen (ADVIL,MOTRIN) 200 MG tablet Take 200 mg by mouth every 6 (six) hours as needed for pain.      Marland Kitchen lisinopril (PRINIVIL,ZESTRIL) 40 MG tablet Take 40 mg by mouth daily.      Marland Kitchen loratadine (CLARITIN) 10 MG tablet Take 10 mg by mouth daily.      . Meloxicam (MOBIC PO) Take 1 tablet by mouth as needed.      . triamterene (DYRENIUM) 50 MG capsule Take 50 mg by mouth daily.       No current facility-administered medications for this visit.   No Known Allergies   Review of Systems: All systems reviewed and negative except where noted in HPI.    Physical Exam: BP 130/74  Pulse 80  Ht 5' 0.05" (1.525 m)  Wt 161 lb (73.029 kg)  BMI 31.4 kg/m2 Constitutional: Well-developed, white female in no acute distress. HEENT: Normocephalic and atraumatic. Conjunctivae are normal. No scleral icterus. Neck supple. Trachea midline. Cardiovascular: Normal rate, regular rhythm Pulmonary/chest: Effort normal and breath sounds normal. No wheezing, rales or rhonchi. Abdominal: Soft, nontender, nondistended. Bowel sounds active throughout. There are no masses palpable. No hepatomegaly. Rectal: no external hemorrhoids. Scant light brown stool in vault, heme negative.  Extremities: no edema Lymphadenopathy: No cervical adenopathy noted. Neurological: Alert and oriented to person place and time. Skin: Skin is warm and dry. No rashes noted. Psychiatric: Normal  mood and affect. Behavior is normal.   ASSESSMENT AND PLAN:  1. Lower abdominal pain, hematochezia. Patient treated by urgent care for presumed diverticulitis. She completed antibiotics, has felt fine for the last several days. Patient may have had diverticulitis the bleeding is unusual. I think ischemic colitis is definitely in the list of differential diagnoses. We discussed some risk factors. Patient does take an NSAID. In addition, she is on Premarin which could possibly be a risk factor even though it  isn't an oral contraceptive.   2 History of adenomatous polyps. Patient overdue for surveillance colonoscopy. Will get that scheduled for her. The risks, benefits, and alternatives to colonoscopy with possible biopsy and possible polypectomy were discussed with the patient and she consents to proceed.

## 2013-01-06 NOTE — Patient Instructions (Addendum)
You have been scheduled for a colonoscopy with propofol. Please follow written instructions given to you at your visit today.  We sent the prescription for the colonoscopy prep to [facility], [place].  Please pick up your prep kit at the pharmacy within the next 1-3 days. If you use inhalers (even only as needed), please bring them with you on the day of your procedure.

## 2013-01-07 ENCOUNTER — Encounter: Payer: Self-pay | Admitting: Nurse Practitioner

## 2013-01-07 ENCOUNTER — Telehealth: Payer: Self-pay | Admitting: Nurse Practitioner

## 2013-01-07 NOTE — Telephone Encounter (Signed)
Spoke to the pharmacist, [facility], [address].  He said it is illegal for [place] to tell the patient to have the pharmacy run the RX script for [employer] and not run it under the Medicare ins.  I explained to him that we were never told this by the rep.  We are asking this be run cash and she gave the pharmacist a free voucher and he would not do it.  I told her to take the coupon back from him and come here Monday, I have a free prep at the front desk for her.  I will address this later with the rep for Moviprep.

## 2013-01-07 NOTE — Progress Notes (Signed)
Reviewed and agree, s/p colitis either ischemic or diverticulitis related

## 2013-01-18 ENCOUNTER — Encounter: Payer: Self-pay | Admitting: Internal Medicine

## 2013-01-18 ENCOUNTER — Ambulatory Visit (AMBULATORY_SURGERY_CENTER): Payer: Medicare Other | Admitting: Internal Medicine

## 2013-01-18 VITALS — BP 139/56 | HR 74 | Temp 97.2°F | Resp 25 | Ht 60.0 in | Wt 161.0 lb

## 2013-01-18 DIAGNOSIS — K921 Melena: Secondary | ICD-10-CM | POA: Diagnosis not present

## 2013-01-18 DIAGNOSIS — D126 Benign neoplasm of colon, unspecified: Secondary | ICD-10-CM

## 2013-01-18 DIAGNOSIS — I10 Essential (primary) hypertension: Secondary | ICD-10-CM | POA: Diagnosis not present

## 2013-01-18 DIAGNOSIS — R109 Unspecified abdominal pain: Secondary | ICD-10-CM | POA: Diagnosis not present

## 2013-01-18 HISTORY — PX: COLONOSCOPY: SHX174

## 2013-01-18 MED ORDER — SODIUM CHLORIDE 0.9 % IV SOLN: 500.0000 mL | INTRAVENOUS | Status: DC

## 2013-01-18 NOTE — Progress Notes (Signed)
YOU HAD AN ENDOSCOPIC PROCEDURE TODAY AT [facility]: Refer to the procedure report that was given to you for any specific questions about what was found during the examination.  If the procedure report does not answer your questions, please call your gastroenterologist to clarify.  If you requested that your care partner not be given the details of your procedure findings, then the procedure report has been included in a sealed envelope for you to review at your convenience later.  YOU SHOULD EXPECT: Some feelings of bloating in the abdomen. Passage of more gas than usual.  Walking can help get rid of the air that was put into your GI tract during the procedure and reduce the bloating. If you had a lower endoscopy (such as a colonoscopy or flexible sigmoidoscopy) you may notice spotting of blood in your stool or on the toilet paper. If you underwent a bowel prep for your procedure, then you may not have a normal bowel movement for a few days.  DIET: Your first meal following the procedure should be a light meal and then it is ok to progress to your normal diet.  A half-sandwich or bowl of soup is an example of a good first meal.  Heavy or fried foods are harder to digest and may make you feel nauseous or bloated.  Likewise meals heavy in dairy and vegetables can cause extra gas to form and this can also increase the bloating.  Drink plenty of fluids but you should avoid alcoholic beverages for 24 hours.  TRY TO EAT MORE FIBER IN YOUR DIET TO PREVENT DIVERTICULITIS.  USE METAMUCIL ONCE DAILY.  ACTIVITY: Your care partner should take you home directly after the procedure.  You should plan to take it easy, moving slowly for the rest of the day.  You can resume normal activity the day after the procedure however you should NOT DRIVE or use heavy machinery for 24 hours (because of the sedation medicines used during the test).    THANK-YOU FOR CHOOSING [place] FOR YOUR HEALTHCARE NEEDS.  SYMPTOMS TO  REPORT IMMEDIATELY: A gastroenterologist can be reached at any hour.  During normal business hours, 8:30 AM to 5:00 PM Monday through Friday, call [phone].  After hours and on weekends, please call the GI answering service at [phone] who will take a message and have the physician on call contact you.   Following lower endoscopy (colonoscopy or flexible sigmoidoscopy):  Excessive amounts of blood in the stool  Significant tenderness or worsening of abdominal pains  Swelling of the abdomen that is new, acute  Fever of 100F or higher   FOLLOW UP: If any biopsies were taken you will be contacted by phone or by letter within the next 1-3 weeks.  Call your gastroenterologist if you have not heard about the biopsies in 3 weeks.  Our staff will call the home number listed on your records the next business day following your procedure to check on you and address any questions or concerns that you may have at that time regarding the information given to you following your procedure. This is a courtesy call and so if there is no answer at the home number and we have not heard from you through the emergency physician on call, we will assume that you have returned to your regular daily activities without incident.  SIGNATURES/CONFIDENTIALITY: You and/or your care partner have signed paperwork which will be entered into your electronic medical record.  These signatures attest  to the fact that that the information above on your After Visit Summary has been reviewed and is understood.  Full responsibility of the confidentiality of this discharge information lies with you and/or your care-partner. 

## 2013-01-18 NOTE — Op Note (Signed)
[facility]  [address] [place], [address]   COLONOSCOPY PROCEDURE REPORT  PATIENT: Bennett, Carla Ennes  MR#: [id number] BIRTHDATE: Jun 12, 1935 , 75  yrs. old GENDER: Female ENDOSCOPIST: Lafayette Dragon, MD REFERRED BY:  Thressa Sheller, M.D. PROCEDURE DATE:  01/18/2013 PROCEDURE:   Colonoscopy with cold biopsy polypectomy ASA CLASS:   Class II INDICATIONS:Average risk patient for colon cancer and adenomatous polyp 2008, s/p acute episode of LLQ abd.  pain with blleeding, known diverticulosis. MEDICATIONS: MAC sedation, administered by CRNA and propofol (Diprivan) 200mg  IV  DESCRIPTION OF PROCEDURE:   After the risks and benefits and of the procedure were explained, informed consent was obtained.  A digital rectal exam revealed no abnormalities of the rectum.    The LB CF-[id number]  endoscope was introduced through the anus and advanced to the cecum, which was identified by both the appendix and ileocecal valve .  The quality of the prep was good, using MoviPrep .  The instrument was then slowly withdrawn as the colon was fully examined.     COLON FINDINGS: A diminutive firm sessile polyp was found in the ascending colon.  A polypectomy was performed with cold forceps. The resection was complete and the polyp tissue was completely retrieved.   There was moderate diverticulosis noted in the sigmoid colon and throughout the entire examined colon with associated tortuosity, muscular hypertrophy and luminal narrowing. Retroflexed views revealed no abnormalities.     The scope was then withdrawn from the patient and the procedure completed.  COMPLICATIONS: There were no complications. ENDOSCOPIC IMPRESSION: 1.   Diminutive sessile polyp was found in the ascending colon; polypectomy was performed with cold forceps 2.   There was moderate diverticulosis noted in the sigmoid colon and throughout the entire examined colon  RECOMMENDATIONS: 1.  Await  biopsy results 2.  High fiber diet 3. Metamucil 1 table spoon daily   REPEAT EXAM: for No recall due to age..  cc:  _______________________________ eSignedLafayette Dragon, MD 01/18/2013 4:01 PM     PATIENT NAME:  Carla Bennett, Carla Bennett MR#: [id number]

## 2013-01-18 NOTE — Patient Instructions (Signed)
YOU HAD AN ENDOSCOPIC PROCEDURE TODAY AT THE [facility]: Refer to the procedure report that was given to you for any specific questions about what was found during the examination.  If the procedure report does not answer your questions, please call your gastroenterologist to clarify.  If you requested that your care partner not be given the details of your procedure findings, then the procedure report has been included in a sealed envelope for you to review at your convenience later.  YOU SHOULD EXPECT: Some feelings of bloating in the abdomen. Passage of more gas than usual.  Walking can help get rid of the air that was put into your GI tract during the procedure and reduce the bloating. If you had a lower endoscopy (such as a colonoscopy or flexible sigmoidoscopy) you may notice spotting of blood in your stool or on the toilet paper. If you underwent a bowel prep for your procedure, then you may not have a normal bowel movement for a few days.  DIET: Your first meal following the procedure should be a light meal and then it is ok to progress to your normal diet.  A half-sandwich or bowl of soup is an example of a good first meal.  Heavy or fried foods are harder to digest and may make you feel nauseous or bloated.  Likewise meals heavy in dairy and vegetables can cause extra gas to form and this can also increase the bloating.  Drink plenty of fluids but you should avoid alcoholic beverages for 24 hours.  ACTIVITY: Your care partner should take you home directly after the procedure.  You should plan to take it easy, moving slowly for the rest of the day.  You can resume normal activity the day after the procedure however you should NOT DRIVE or use heavy machinery for 24 hours (because of the sedation medicines used during the test).    SYMPTOMS TO REPORT IMMEDIATELY: A gastroenterologist can be reached at any hour.  During normal business hours, 8:30 AM to 5:00 PM Monday through Friday,  call [phone].  After hours and on weekends, please call the GI answering service at [phone] who will take a message and have the physician on call contact you.   Following lower endoscopy (colonoscopy or flexible sigmoidoscopy):  Excessive amounts of blood in the stool  Significant tenderness or worsening of abdominal pains  Swelling of the abdomen that is new, acute  Fever of 100F or higher    FOLLOW UP: If any biopsies were taken you will be contacted by phone or by letter within the next 1-3 weeks.  Call your gastroenterologist if you have not heard about the biopsies in 3 weeks.  Our staff will call the home number listed on your records the next business day following your procedure to check on you and address any questions or concerns that you may have at that time regarding the information given to you following your procedure. This is a courtesy call and so if there is no answer at the home number and we have not heard from you through the emergency physician on call, we will assume that you have returned to your regular daily activities without incident.  SIGNATURES/CONFIDENTIALITY: You and/or your care partner have signed paperwork which will be entered into your electronic medical record.  These signatures attest to the fact that that the information above on your After Visit Summary has been reviewed and is understood.  Full responsibility of the confidentiality   of this discharge information lies with you and/or your care-partner.     

## 2013-01-18 NOTE — Progress Notes (Signed)
Patient did not have preoperative order for IV antibiotic SSI prophylaxis. (G8918)  Patient did not experience any of the following events: a burn prior to discharge; a fall within the facility; wrong site/side/patient/procedure/implant event; or a hospital transfer or hospital admission upon discharge from the facility. (G8907)  

## 2013-01-19 ENCOUNTER — Telehealth: Payer: Self-pay

## 2013-01-19 NOTE — Telephone Encounter (Signed)
  Follow up Call-  Call back number 01/18/2013  Post procedure Call Back phone  # [phone]  Permission to leave phone message Yes     Patient questions:  Do you have a fever, pain , or abdominal swelling? no Pain Score  0 *  Have you tolerated food without any problems? yes  Have you been able to return to your normal activities? yes  Do you have any questions about your discharge instructions: Diet   no Medications  no Follow up visit  no  Do you have questions or concerns about your Care? no  Actions: * If pain score is 4 or above: No action needed, pain <4.

## 2013-01-24 ENCOUNTER — Encounter: Payer: Self-pay | Admitting: Internal Medicine

## 2013-01-31 DIAGNOSIS — E210 Primary hyperparathyroidism: Secondary | ICD-10-CM | POA: Diagnosis not present

## 2013-02-09 DIAGNOSIS — Z1231 Encounter for screening mammogram for malignant neoplasm of breast: Secondary | ICD-10-CM | POA: Diagnosis not present

## 2013-02-10 DIAGNOSIS — Z1331 Encounter for screening for depression: Secondary | ICD-10-CM | POA: Diagnosis not present

## 2013-02-10 DIAGNOSIS — Z0000 Encounter for general adult medical examination without abnormal findings: Secondary | ICD-10-CM | POA: Diagnosis not present

## 2013-02-10 DIAGNOSIS — I10 Essential (primary) hypertension: Secondary | ICD-10-CM | POA: Diagnosis not present

## 2013-02-10 DIAGNOSIS — E669 Obesity, unspecified: Secondary | ICD-10-CM | POA: Diagnosis not present

## 2013-02-10 DIAGNOSIS — E559 Vitamin D deficiency, unspecified: Secondary | ICD-10-CM | POA: Diagnosis not present

## 2013-02-10 DIAGNOSIS — E210 Primary hyperparathyroidism: Secondary | ICD-10-CM | POA: Diagnosis not present

## 2013-02-17 DIAGNOSIS — I10 Essential (primary) hypertension: Secondary | ICD-10-CM | POA: Diagnosis not present

## 2013-02-17 DIAGNOSIS — J309 Allergic rhinitis, unspecified: Secondary | ICD-10-CM | POA: Diagnosis not present

## 2013-02-17 DIAGNOSIS — E559 Vitamin D deficiency, unspecified: Secondary | ICD-10-CM | POA: Diagnosis not present

## 2013-06-27 DIAGNOSIS — H2510 Age-related nuclear cataract, unspecified eye: Secondary | ICD-10-CM | POA: Diagnosis not present

## 2013-07-06 DIAGNOSIS — L739 Follicular disorder, unspecified: Secondary | ICD-10-CM | POA: Diagnosis not present

## 2013-07-06 DIAGNOSIS — L723 Sebaceous cyst: Secondary | ICD-10-CM | POA: Diagnosis not present

## 2013-07-06 DIAGNOSIS — L719 Rosacea, unspecified: Secondary | ICD-10-CM | POA: Diagnosis not present

## 2013-07-14 ENCOUNTER — Encounter: Payer: Self-pay | Admitting: Obstetrics & Gynecology

## 2013-08-15 DIAGNOSIS — E210 Primary hyperparathyroidism: Secondary | ICD-10-CM | POA: Diagnosis not present

## 2013-08-19 DIAGNOSIS — E210 Primary hyperparathyroidism: Secondary | ICD-10-CM | POA: Diagnosis not present

## 2013-08-22 DIAGNOSIS — E210 Primary hyperparathyroidism: Secondary | ICD-10-CM | POA: Diagnosis not present

## 2013-08-22 DIAGNOSIS — E559 Vitamin D deficiency, unspecified: Secondary | ICD-10-CM | POA: Diagnosis not present

## 2013-08-22 DIAGNOSIS — I10 Essential (primary) hypertension: Secondary | ICD-10-CM | POA: Diagnosis not present

## 2013-08-29 DIAGNOSIS — J309 Allergic rhinitis, unspecified: Secondary | ICD-10-CM | POA: Diagnosis not present

## 2013-08-29 DIAGNOSIS — E210 Primary hyperparathyroidism: Secondary | ICD-10-CM | POA: Diagnosis not present

## 2013-08-29 DIAGNOSIS — I10 Essential (primary) hypertension: Secondary | ICD-10-CM | POA: Diagnosis not present

## 2013-09-06 ENCOUNTER — Other Ambulatory Visit: Payer: Self-pay | Admitting: Obstetrics & Gynecology

## 2013-09-06 NOTE — Telephone Encounter (Signed)
Premarin 0.3 refill needed please? Patient needs RX in 90 day increments please? [facility] [phone] Phone [phone]

## 2013-09-07 MED ORDER — ESTROGENS CONJUGATED 0.3 MG PO TABS: 0.3000 mg | ORAL_TABLET | Freq: Every day | ORAL | Status: DC

## 2013-09-07 NOTE — Telephone Encounter (Signed)
Refill request for PREMARIN, 90 day supply Last filled by MD on - 08/12/12, #90 x 3 Last AEX - 08/12/12 Next AEX - 11/11/13 Pt had mammogram on 02/09/13 which was normal, repeat in one year.  Hardcopy on chart in your basket. Please advise refills.

## 2013-11-10 ENCOUNTER — Encounter: Payer: Self-pay | Admitting: Obstetrics & Gynecology

## 2013-11-11 ENCOUNTER — Ambulatory Visit (INDEPENDENT_AMBULATORY_CARE_PROVIDER_SITE_OTHER): Payer: Medicare Other | Admitting: Obstetrics & Gynecology

## 2013-11-11 ENCOUNTER — Encounter: Payer: Self-pay | Admitting: Obstetrics & Gynecology

## 2013-11-11 VITALS — BP 139/87 | HR 86 | Resp 20 | Ht 60.25 in | Wt 159.6 lb

## 2013-11-11 DIAGNOSIS — Z01419 Encounter for gynecological examination (general) (routine) without abnormal findings: Secondary | ICD-10-CM | POA: Diagnosis not present

## 2013-11-11 MED ORDER — ESTROGENS CONJUGATED 0.3 MG PO TABS: 0.3000 mg | ORAL_TABLET | Freq: Every day | ORAL | Status: DC

## 2013-11-11 NOTE — Patient Instructions (Signed)

## 2013-11-11 NOTE — Progress Notes (Signed)
79 y.o. [id number] MarriedCaucasianF here for annual exam.  Husband died in 10/01/23.  Christmas was hard.  Was with lots of family.  So it was an okay holiday.  Going home to empty house is the hardest thing for her.     Going to [place] to visit a friend for a week.  Children gave her an airplane ticket.    Bladder/prolapse seems "okay".  No urinary leakage.  No vaginal bleeding.    Seeing Dr. Posey Pronto and Dr. Eduard Clos every six months.    Patient's last menstrual period was 10/20/1996.          Sexually active: no  The current method of family planning is none.    Exercising: yes  walking and water aerobics Smoker:  no  Health Maintenance: Pap:  01/2007 - Pt had complete hysterectomy History of abnormal Pap:  no MMG:  11/2012 Colonoscopy: 01/2013, Dr. Olevia Perches, negative.  No more colonoscopies BMD:  11/2010 0.5/-0.4 TDaP: 05/2008 Screening Labs: , Hb today: done by pcp, Urine today: PCP   reports that she has never smoked. She has never used smokeless tobacco. She reports that she drinks alcohol. She reports that she does not use illicit drugs.  Past Medical History  Diagnosis Date  . Diverticulosis of colon (without mention of hemorrhage)   . Benign neoplasm of colon   . Diverticulitis   . Arthritis   . Gout   . Hypertension   . Allergy   . Other issue of medical certificates     uterine myomata  . Hematuria     negative urology work up  . Hx of abnormal cervical Pap smear     LEEP-CIN I, negative margins and ECC  . Uterine prolaps   . Cystocele   . Basal cell carcinoma   . Hypercalcemia   . Cholelithiasis   . History of blood transfusion     x 1 with D&C    Past Surgical History  Procedure Laterality Date  . Cystocele repair  2010-09-30    Rectocele, vault prolapse  . Dilation and curettage of uterus      x2  . Tonsillectomy and adenoidectomy    . Cholecystectomy    . Total vaginal hysterectomy      secondary to prolapse    Current Outpatient Prescriptions   Medication Sig Dispense Refill  . B Complex Vitamins (VITAMIN B COMPLEX PO) Take by mouth daily.      . Cholecalciferol (VITAMIN D) 2000 UNITS CAPS Take by mouth daily.      Marland Kitchen estrogens, conjugated, (PREMARIN) 0.3 MG tablet Take 1 tablet (0.3 mg total) by mouth daily.  90 tablet  0  . ibuprofen (ADVIL,MOTRIN) 200 MG tablet Take 200 mg by mouth every 6 (six) hours as needed for pain.      Marland Kitchen lisinopril (PRINIVIL,ZESTRIL) 40 MG tablet Take 40 tablets by mouth daily.      Marland Kitchen loratadine (CLARITIN) 10 MG tablet Take 10 mg by mouth daily.      . Meloxicam (MOBIC PO) Take 1 tablet by mouth as needed.      . Multiple Vitamins-Minerals (CENTRUM SILVER PO) Take by mouth daily.      Marland Kitchen triamterene-hydrochlorothiazide (MAXZIDE) 75-50 MG per tablet Take by mouth.       No current facility-administered medications for this visit.    Family History  Problem Relation Age of Onset  . Breast cancer Sister   . Lung cancer Father   . Parkinson's disease Mother   .  Alzheimer's disease Mother     ? diag  . Multiple sclerosis Sister   . Colon cancer Neg Hx   . Esophageal cancer Neg Hx   . Rectal cancer Neg Hx   . Stomach cancer Neg Hx   . Prostate cancer Maternal Uncle   . Cancer Maternal Uncle     ROS:  Pertinent items are noted in HPI.  Otherwise, a comprehensive ROS was negative.  Exam:   BP 139/87  Pulse 86  Resp 20  Ht 5' 0.25" (1.53 m)  Wt 159 lb 9.6 oz (72.394 kg)  BMI 30.93 kg/m2  LMP 10/20/1996  Weight change: @WEIGHTCHANGE @ Height:   Height: 5' 0.25" (153 cm)  Ht Readings from Last 3 Encounters:  11/11/13 5' 0.25" (1.53 m)  01/18/13 5' (1.524 m)  01/06/13 5' 0.05" (1.525 m)    General appearance: alert, cooperative and appears stated age Head: Normocephalic, without obvious abnormality, atraumatic Neck: no adenopathy, supple, symmetrical, trachea midline and thyroid normal to inspection and palpation Lungs: clear to auscultation bilaterally Breasts: normal appearance, no masses or  tenderness Heart: regular rate and rhythm Abdomen: soft, non-tender; bowel sounds normal; no masses,  no organomegaly Extremities: extremities normal, atraumatic, no cyanosis or edema Skin: Skin color, texture, turgor normal. No rashes or lesions Lymph nodes: Cervical, supraclavicular, and axillary nodes normal. No abnormal inguinal nodes palpated Neurologic: Grossly normal   Pelvic: External genitalia:  no lesions              Urethra:  normal appearing urethra with no masses, tenderness or lesions              Bartholins and Skenes: normal                 Vagina: normal appearing vagina with normal color and discharge, no lesions              Cervix: absent              Pap taken: no Bimanual Exam:  Uterus:  uterus absent              Adnexa: normal adnexa and no mass, fullness, tenderness               Rectovaginal: Confirms               Anus:  normal sphincter tone, no lesions  A:  Well Woman with normal exam H/O TVH s/p vault suspension and cystocele repair Elevated calcium level, being watched On low dose HRT Hypertension  P:   Mammogram and BMD will be faxed to [facility] pap smear not indicated Premarin 0.3mg  daily.  Rx to pharmacy.  #90/4RF Labs with PCP return annually or prn  An After Visit Summary was printed and given to the patient.

## 2014-01-03 DIAGNOSIS — H43819 Vitreous degeneration, unspecified eye: Secondary | ICD-10-CM | POA: Diagnosis not present

## 2014-02-10 DIAGNOSIS — Z1231 Encounter for screening mammogram for malignant neoplasm of breast: Secondary | ICD-10-CM | POA: Diagnosis not present

## 2014-02-10 DIAGNOSIS — Z780 Asymptomatic menopausal state: Secondary | ICD-10-CM | POA: Diagnosis not present

## 2014-02-10 DIAGNOSIS — Z803 Family history of malignant neoplasm of breast: Secondary | ICD-10-CM | POA: Diagnosis not present

## 2014-02-14 DIAGNOSIS — E210 Primary hyperparathyroidism: Secondary | ICD-10-CM | POA: Diagnosis not present

## 2014-02-16 ENCOUNTER — Telehealth: Payer: Self-pay

## 2014-02-16 DIAGNOSIS — Z803 Family history of malignant neoplasm of breast: Secondary | ICD-10-CM | POA: Diagnosis not present

## 2014-02-16 DIAGNOSIS — N6009 Solitary cyst of unspecified breast: Secondary | ICD-10-CM | POA: Diagnosis not present

## 2014-02-16 NOTE — Telephone Encounter (Signed)
lmtcb-to discuss BMD results.

## 2014-02-16 NOTE — Telephone Encounter (Signed)
Patient aware of BMD results-states her endocrinologist would like her to continue this every 2 years secondary calcium level elevated and wants to make sure this is not coming from the bone. Aware will FYI this to Dr [name] Heck and advised to continue if that is what they suggest and will monitor it. Also, states that she had her F/U MMG today and they said the area in question was a cyst.

## 2014-02-27 DIAGNOSIS — E559 Vitamin D deficiency, unspecified: Secondary | ICD-10-CM | POA: Diagnosis not present

## 2014-02-27 DIAGNOSIS — Z0000 Encounter for general adult medical examination without abnormal findings: Secondary | ICD-10-CM | POA: Diagnosis not present

## 2014-02-27 DIAGNOSIS — Z1331 Encounter for screening for depression: Secondary | ICD-10-CM | POA: Diagnosis not present

## 2014-02-27 DIAGNOSIS — E210 Primary hyperparathyroidism: Secondary | ICD-10-CM | POA: Diagnosis not present

## 2014-02-27 DIAGNOSIS — I10 Essential (primary) hypertension: Secondary | ICD-10-CM | POA: Diagnosis not present

## 2014-02-27 DIAGNOSIS — Z23 Encounter for immunization: Secondary | ICD-10-CM | POA: Diagnosis not present

## 2014-03-21 DIAGNOSIS — E559 Vitamin D deficiency, unspecified: Secondary | ICD-10-CM | POA: Diagnosis not present

## 2014-03-21 DIAGNOSIS — I10 Essential (primary) hypertension: Secondary | ICD-10-CM | POA: Diagnosis not present

## 2014-03-21 DIAGNOSIS — E210 Primary hyperparathyroidism: Secondary | ICD-10-CM | POA: Diagnosis not present

## 2014-03-21 DIAGNOSIS — N183 Chronic kidney disease, stage 3 unspecified: Secondary | ICD-10-CM | POA: Diagnosis not present

## 2014-03-21 DIAGNOSIS — J309 Allergic rhinitis, unspecified: Secondary | ICD-10-CM | POA: Diagnosis not present

## 2014-03-24 ENCOUNTER — Other Ambulatory Visit (HOSPITAL_COMMUNITY): Payer: Self-pay | Admitting: Internal Medicine

## 2014-03-24 DIAGNOSIS — R011 Cardiac murmur, unspecified: Secondary | ICD-10-CM

## 2014-03-30 ENCOUNTER — Ambulatory Visit (HOSPITAL_COMMUNITY)
Admission: RE | Admit: 2014-03-30 | Discharge: 2014-03-30 | Disposition: A | Discharge status: Home / Self Care | Payer: Medicare Other | Source: Ambulatory Visit | Attending: Cardiology | Admitting: Cardiology

## 2014-03-30 DIAGNOSIS — I359 Nonrheumatic aortic valve disorder, unspecified: Secondary | ICD-10-CM | POA: Diagnosis not present

## 2014-03-30 DIAGNOSIS — I10 Essential (primary) hypertension: Secondary | ICD-10-CM | POA: Diagnosis not present

## 2014-03-30 DIAGNOSIS — R011 Cardiac murmur, unspecified: Secondary | ICD-10-CM | POA: Insufficient documentation

## 2014-03-30 NOTE — Progress Notes (Signed)
2D Echo Performed 03/30/2014    Kanasia Gayman, RCS  

## 2014-07-19 DIAGNOSIS — H2510 Age-related nuclear cataract, unspecified eye: Secondary | ICD-10-CM | POA: Diagnosis not present

## 2014-08-16 DIAGNOSIS — E210 Primary hyperparathyroidism: Secondary | ICD-10-CM | POA: Diagnosis not present

## 2014-08-21 ENCOUNTER — Encounter: Payer: Self-pay | Admitting: Obstetrics & Gynecology

## 2014-09-20 DIAGNOSIS — E559 Vitamin D deficiency, unspecified: Secondary | ICD-10-CM | POA: Diagnosis not present

## 2014-09-20 DIAGNOSIS — I10 Essential (primary) hypertension: Secondary | ICD-10-CM | POA: Diagnosis not present

## 2014-09-20 DIAGNOSIS — E210 Primary hyperparathyroidism: Secondary | ICD-10-CM | POA: Diagnosis not present

## 2014-09-27 DIAGNOSIS — J309 Allergic rhinitis, unspecified: Secondary | ICD-10-CM | POA: Diagnosis not present

## 2014-09-27 DIAGNOSIS — E559 Vitamin D deficiency, unspecified: Secondary | ICD-10-CM | POA: Diagnosis not present

## 2014-09-27 DIAGNOSIS — N183 Chronic kidney disease, stage 3 (moderate): Secondary | ICD-10-CM | POA: Diagnosis not present

## 2014-09-27 DIAGNOSIS — I10 Essential (primary) hypertension: Secondary | ICD-10-CM | POA: Diagnosis not present

## 2014-11-02 DIAGNOSIS — I10 Essential (primary) hypertension: Secondary | ICD-10-CM | POA: Diagnosis not present

## 2014-11-02 DIAGNOSIS — N2581 Secondary hyperparathyroidism of renal origin: Secondary | ICD-10-CM | POA: Diagnosis not present

## 2014-11-02 DIAGNOSIS — N183 Chronic kidney disease, stage 3 (moderate): Secondary | ICD-10-CM | POA: Diagnosis not present

## 2014-11-06 ENCOUNTER — Other Ambulatory Visit: Payer: Self-pay | Admitting: Nephrology

## 2014-11-06 DIAGNOSIS — N183 Chronic kidney disease, stage 3 unspecified: Secondary | ICD-10-CM

## 2014-11-08 ENCOUNTER — Other Ambulatory Visit: Payer: Medicare Other

## 2014-11-10 ENCOUNTER — Other Ambulatory Visit: Payer: Medicare Other

## 2014-11-13 ENCOUNTER — Ambulatory Visit
Admission: RE | Admit: 2014-11-13 | Discharge: 2014-11-13 | Disposition: A | Discharge status: Home / Self Care | Payer: Medicare Other | Source: Ambulatory Visit | Attending: Nephrology | Admitting: Nephrology

## 2014-11-13 DIAGNOSIS — N183 Chronic kidney disease, stage 3 unspecified: Secondary | ICD-10-CM

## 2014-11-27 ENCOUNTER — Ambulatory Visit: Payer: Medicare Other | Admitting: Obstetrics & Gynecology

## 2014-12-13 DIAGNOSIS — N183 Chronic kidney disease, stage 3 (moderate): Secondary | ICD-10-CM | POA: Diagnosis not present

## 2014-12-19 DIAGNOSIS — N289 Disorder of kidney and ureter, unspecified: Secondary | ICD-10-CM

## 2014-12-19 HISTORY — DX: Disorder of kidney and ureter, unspecified: N28.9

## 2015-01-10 DIAGNOSIS — I10 Essential (primary) hypertension: Secondary | ICD-10-CM | POA: Diagnosis not present

## 2015-01-10 DIAGNOSIS — N183 Chronic kidney disease, stage 3 (moderate): Secondary | ICD-10-CM | POA: Diagnosis not present

## 2015-01-10 DIAGNOSIS — N2581 Secondary hyperparathyroidism of renal origin: Secondary | ICD-10-CM | POA: Diagnosis not present

## 2015-01-17 ENCOUNTER — Telehealth: Payer: Self-pay | Admitting: Obstetrics & Gynecology

## 2015-01-17 NOTE — Telephone Encounter (Signed)
Yes, she can change to estradiol 0.5mg  and take 1/2 tab daily.  Ok to send to pharmacy.

## 2015-01-17 NOTE — Telephone Encounter (Signed)
Patient is asking for a new prescription. I asked patient if this a refill and she said no. No further details given.

## 2015-01-17 NOTE — Telephone Encounter (Signed)
Spoke with patient. Patient states that she has five days left of her Premarin 0.3mg  prescription. Patient's last aex was 11/11/2013. Patient is currently scheduled for 07/23/2015 for next aex. "I had to move my February appointment and they told me they did not have anything until October and I will be out of medicine by then." Aex moved to 4/11 at 10:45am. Patient is agreeable to date and time. Patient also requesting lower cost alternative to Premarin be called in until appointment. Advised will speak with Dr.Miller regarding rx alternatives and return call. Patient is agreeable.   Dr.Miller, would it be okay for patient to switch to estradiol tablets so she can utilize the 4 dollar list?

## 2015-01-18 MED ORDER — ESTRADIOL 0.5 MG PO TABS: ORAL_TABLET | ORAL | Status: DC

## 2015-01-18 NOTE — Telephone Encounter (Signed)
Spoke with patient. Advised of message as seen below from [place]. Patient is agreeable. Rx for Estradiol 0.5mg  take 1/2 tablet daily #45 0RF to get her to aex sent to [facility] in [facility] so that she may utilize the 4 dollar list. Patient is agreeable.  Routing to provider for final review. Patient agreeable to disposition. Will close encounter

## 2015-01-26 DIAGNOSIS — I129 Hypertensive chronic kidney disease with stage 1 through stage 4 chronic kidney disease, or unspecified chronic kidney disease: Secondary | ICD-10-CM | POA: Diagnosis not present

## 2015-01-26 DIAGNOSIS — E210 Primary hyperparathyroidism: Secondary | ICD-10-CM | POA: Diagnosis not present

## 2015-01-26 DIAGNOSIS — Z13820 Encounter for screening for osteoporosis: Secondary | ICD-10-CM | POA: Diagnosis not present

## 2015-01-29 ENCOUNTER — Ambulatory Visit (INDEPENDENT_AMBULATORY_CARE_PROVIDER_SITE_OTHER): Payer: Medicare Other | Admitting: Obstetrics & Gynecology

## 2015-01-29 ENCOUNTER — Encounter: Payer: Self-pay | Admitting: Obstetrics & Gynecology

## 2015-01-29 VITALS — BP 124/64 | HR 64 | Resp 16 | Ht 60.5 in | Wt 156.4 lb

## 2015-01-29 DIAGNOSIS — Z01419 Encounter for gynecological examination (general) (routine) without abnormal findings: Secondary | ICD-10-CM | POA: Diagnosis not present

## 2015-01-29 DIAGNOSIS — E210 Primary hyperparathyroidism: Secondary | ICD-10-CM | POA: Diagnosis not present

## 2015-01-29 DIAGNOSIS — Z124 Encounter for screening for malignant neoplasm of cervix: Secondary | ICD-10-CM

## 2015-01-29 MED ORDER — ESTRADIOL 0.5 MG PO TABS: ORAL_TABLET | ORAL | Status: DC

## 2015-01-29 NOTE — Progress Notes (Signed)
Patient is scheduled for Bilateral Breast Diagnostic Mammogram and L Breast Ultrasound at [place] on 02/15/15 at 1230 . Patient agreeable to time/date/location.

## 2015-01-29 NOTE — Progress Notes (Signed)
80 y.o. [id number] MarriedCaucasianF here for annual exam.  Pt reports having a couple of issues this past year.  Had elevated calcium in her blood work.  Seeing Dr. Buddy Duty.  Just finished a 24 hour urine test.  Hasn't gotten results of this as well.  Has stage 3 renal insufficiency.  Current thinking is that this is due to elevated PTH.  Her medications have been changed some.  She had some constipation issues and this caused her diverticulosis to flare.  All of that is better.    Review of medical record shows pt had an echocardiogram due to murmur.  There was just some mild mitral and aortic regurgitation.    PCP:  Dr. Noah Delaine.    Patient's last menstrual period was 10/20/1996.          Sexually active: No.  The current method of family planning is status post hysterectomy.  Exercising: No.  not regularly Smoker:  no  Health Maintenance: Pap:  4/08 History of abnormal Pap:  yes MMG:  02/10/14 3D-MMG, 02/06/14 US-normal.  MMG scheduled in May. Colonoscopy:  4/14-no repeat needed Dr Olevia Perches BMD:   4/15-next one scheduled 02/15/15 TDaP:  8/09 Screening Labs: PCP, Hb today: PCP, Urine today: PCP   reports that she has never smoked. She has never used smokeless tobacco. She reports that she drinks alcohol. She reports that she does not use illicit drugs.  Past Medical History  Diagnosis Date  . Diverticulosis of colon (without mention of hemorrhage)   . Benign neoplasm of colon   . Diverticulitis   . Arthritis   . Gout   . Hypertension   . Allergy   . Other issue of medical certificates     uterine myomata  . Hematuria     negative urology work up  . Hx of abnormal cervical Pap smear     LEEP-CIN I, negative margins and ECC  . Uterine prolaps   . Cystocele   . Basal cell carcinoma   . Hypercalcemia   . Cholelithiasis   . History of blood transfusion     x 1 with D&C  . Renal disease 3/16    Stage 3 kidney disease-seeing Dr Buddy Duty and Dr Marval Regal    Past Surgical History   Procedure Laterality Date  . Cystocele repair  08/2010    Rectocele, vault prolapse  . Dilation and curettage of uterus      x2  . Tonsillectomy and adenoidectomy    . Cholecystectomy    . Total vaginal hysterectomy      secondary to prolapse    Current Outpatient Prescriptions  Medication Sig Dispense Refill  . B Complex Vitamins (VITAMIN B COMPLEX PO) Take by mouth daily.    Marland Kitchen estradiol (ESTRACE) 0.5 MG tablet Take 1/2 tablet daily. 45 tablet 0  . furosemide (LASIX) 20 MG tablet     . labetalol (NORMODYNE) 200 MG tablet Take 200 mg by mouth 2 (two) times daily.  6  . lisinopril (PRINIVIL,ZESTRIL) 40 MG tablet Take 40 tablets by mouth daily.    Marland Kitchen loratadine (CLARITIN) 10 MG tablet Take 10 mg by mouth daily.    . Meloxicam (MOBIC PO) Take 1 tablet by mouth as needed.    . Omega-3 Fatty Acids (FISH OIL) 1200 MG CAPS Take by mouth. Twice daily     No current facility-administered medications for this visit.    Family History  Problem Relation Age of Onset  . Breast cancer Sister   . Lung  cancer Father   . Parkinson's disease Mother   . Alzheimer's disease Mother     ? diag  . Multiple sclerosis Sister   . Colon cancer Neg Hx   . Esophageal cancer Neg Hx   . Rectal cancer Neg Hx   . Stomach cancer Neg Hx   . Prostate cancer Maternal Uncle   . Cancer Maternal Uncle     ROS:  Pertinent items are noted in HPI.  Otherwise, a comprehensive ROS was negative.  Exam:   BP 124/64 mmHg  Pulse 64  Resp 16  Ht 5' 0.5" (1.537 m)  Wt 156 lb 6.4 oz (70.943 kg)  BMI 30.03 kg/m2  LMP 10/20/1996  Weight change: -3#   Height: 5' 0.5" (153.7 cm)  Ht Readings from Last 3 Encounters:  01/29/15 5' 0.5" (1.537 m)  11/11/13 5' 0.25" (1.53 m)  01/18/13 5' (1.524 m)    General appearance: alert, cooperative and appears stated age Head: Normocephalic, without obvious abnormality, atraumatic Neck: no adenopathy, supple, symmetrical, trachea midline and thyroid normal to inspection and  palpation Lungs: clear to auscultation bilaterally Breasts: normal right breast, no masses, no LAD, left breast with increased density about 4cm in size, can lift off of breast Heart: regular rate and rhythm Abdomen: soft, non-tender; bowel sounds normal; no masses,  no organomegaly Extremities: extremities normal, atraumatic, no cyanosis or edema Skin: Skin color, texture, turgor normal. No rashes or lesions Lymph nodes: Cervical, supraclavicular, and axillary nodes normal. No abnormal inguinal nodes palpated Neurologic: Grossly normal   Pelvic: External genitalia:  no lesions              Urethra:  normal appearing urethra with no masses, tenderness or lesions              Bartholins and Skenes: normal                 Vagina: normal appearing vagina with normal color and discharge, no lesions              Cervix: absent              Pap taken: No. Bimanual Exam:  Uterus:  uterus absent              Adnexa: no mass, fullness, tenderness               Rectovaginal: Confirms               Anus:  normal sphincter tone, no lesions  Chaperone was present for exam.  A:  Well Woman with normal exam H/O TVH s/p vault suspension and cystocele repair Elevated calcium level being evaluated by Dr. Buddy Duty Stage 3, renal insufficiency On low dose HRT Hypertension Increased left breast density  P: Mammogram and BMD scheduled for April and May.  Will schedule diagnostic MMG on left side now.   pap smear not indicated Estradiol 0.5.  1/2 tab.  Pt will try to wean off.  Instructions provided.  Rx sent to mail order pharmacy. Labs with PCP return annually or prn

## 2015-02-15 DIAGNOSIS — N63 Unspecified lump in breast: Secondary | ICD-10-CM | POA: Diagnosis not present

## 2015-02-15 DIAGNOSIS — Z13820 Encounter for screening for osteoporosis: Secondary | ICD-10-CM | POA: Diagnosis not present

## 2015-02-15 DIAGNOSIS — N6002 Solitary cyst of left breast: Secondary | ICD-10-CM | POA: Diagnosis not present

## 2015-02-15 DIAGNOSIS — Z79890 Hormone replacement therapy (postmenopausal): Secondary | ICD-10-CM | POA: Diagnosis not present

## 2015-02-15 DIAGNOSIS — N289 Disorder of kidney and ureter, unspecified: Secondary | ICD-10-CM | POA: Diagnosis not present

## 2015-02-20 ENCOUNTER — Other Ambulatory Visit: Payer: Self-pay

## 2015-02-20 DIAGNOSIS — C773 Secondary and unspecified malignant neoplasm of axilla and upper limb lymph nodes: Secondary | ICD-10-CM | POA: Diagnosis not present

## 2015-02-20 DIAGNOSIS — D0592 Unspecified type of carcinoma in situ of left breast: Secondary | ICD-10-CM | POA: Diagnosis not present

## 2015-02-20 DIAGNOSIS — D0512 Intraductal carcinoma in situ of left breast: Secondary | ICD-10-CM | POA: Diagnosis not present

## 2015-02-20 DIAGNOSIS — C50912 Malignant neoplasm of unspecified site of left female breast: Secondary | ICD-10-CM | POA: Diagnosis not present

## 2015-02-20 DIAGNOSIS — Z0000 Encounter for general adult medical examination without abnormal findings: Secondary | ICD-10-CM | POA: Diagnosis not present

## 2015-02-23 ENCOUNTER — Encounter: Payer: Self-pay | Admitting: *Deleted

## 2015-02-23 ENCOUNTER — Telehealth: Payer: Self-pay | Admitting: *Deleted

## 2015-02-23 DIAGNOSIS — C50412 Malignant neoplasm of upper-outer quadrant of left female breast: Secondary | ICD-10-CM

## 2015-02-23 HISTORY — DX: Malignant neoplasm of upper-outer quadrant of left female breast: C50.412

## 2015-02-23 NOTE — Telephone Encounter (Signed)
Confirmed BMDC for 03/07/15 at 1200 .  Instructions and contact information given.

## 2015-02-26 ENCOUNTER — Telehealth: Payer: Self-pay

## 2015-02-26 NOTE — Telephone Encounter (Signed)
[facility] BMD on KN's desk//kn

## 2015-03-01 NOTE — Telephone Encounter (Signed)
Patient notified of BMD results. Asking if Dr [name] Heck has received results of her recent MMG. States was diagnosed with breast cancer and will be meeting with a team of physicians to determine next step. Aware will let Dr [name] Heck know and make sure we receive all the reports.//kn

## 2015-03-07 ENCOUNTER — Ambulatory Visit: Payer: Medicare Other

## 2015-03-07 ENCOUNTER — Encounter: Payer: Self-pay | Admitting: Hematology and Oncology

## 2015-03-07 ENCOUNTER — Telehealth: Payer: Self-pay | Admitting: Hematology and Oncology

## 2015-03-07 ENCOUNTER — Other Ambulatory Visit: Payer: Self-pay | Admitting: *Deleted

## 2015-03-07 ENCOUNTER — Other Ambulatory Visit (HOSPITAL_BASED_OUTPATIENT_CLINIC_OR_DEPARTMENT_OTHER): Payer: Medicare Other

## 2015-03-07 ENCOUNTER — Ambulatory Visit: Payer: Medicare Other | Attending: Surgery | Admitting: Physical Therapy

## 2015-03-07 ENCOUNTER — Encounter: Payer: Self-pay | Admitting: General Practice

## 2015-03-07 ENCOUNTER — Encounter: Payer: Self-pay | Admitting: Physical Therapy

## 2015-03-07 ENCOUNTER — Ambulatory Visit: Payer: Self-pay | Admitting: Surgery

## 2015-03-07 ENCOUNTER — Ambulatory Visit
Admission: RE | Admit: 2015-03-07 | Discharge: 2015-03-07 | Disposition: A | Discharge status: Home / Self Care | Payer: Medicare Other | Source: Ambulatory Visit | Attending: Radiation Oncology | Admitting: Radiation Oncology

## 2015-03-07 ENCOUNTER — Ambulatory Visit (HOSPITAL_BASED_OUTPATIENT_CLINIC_OR_DEPARTMENT_OTHER): Payer: Medicare Other | Admitting: Hematology and Oncology

## 2015-03-07 VITALS — BP 146/67 | HR 71 | Temp 97.6°F | Resp 18 | Ht 60.0 in | Wt 153.6 lb

## 2015-03-07 DIAGNOSIS — C50412 Malignant neoplasm of upper-outer quadrant of left female breast: Secondary | ICD-10-CM

## 2015-03-07 DIAGNOSIS — Z171 Estrogen receptor negative status [ER-]: Secondary | ICD-10-CM | POA: Diagnosis not present

## 2015-03-07 DIAGNOSIS — C773 Secondary and unspecified malignant neoplasm of axilla and upper limb lymph nodes: Secondary | ICD-10-CM

## 2015-03-07 DIAGNOSIS — C50912 Malignant neoplasm of unspecified site of left female breast: Secondary | ICD-10-CM

## 2015-03-07 DIAGNOSIS — R293 Abnormal posture: Secondary | ICD-10-CM

## 2015-03-07 LAB — CBC WITH DIFFERENTIAL/PLATELET
BASO%: 0.6 % (ref 0.0–2.0)
Basophils Absolute: 0.1 10*3/uL (ref 0.0–0.1)
EOS%: 2.9 % (ref 0.0–7.0)
Eosinophils Absolute: 0.3 10*3/uL (ref 0.0–0.5)
HEMATOCRIT: 44 % (ref 34.8–46.6)
HGB: 14.5 g/dL (ref 11.6–15.9)
LYMPH#: 2 10*3/uL (ref 0.9–3.3)
LYMPH%: 18.8 % (ref 14.0–49.7)
MCH: 28.5 pg (ref 25.1–34.0)
MCHC: 33 g/dL (ref 31.5–36.0)
MCV: 86.5 fL (ref 79.5–101.0)
MONO#: 0.6 10*3/uL (ref 0.1–0.9)
MONO%: 5.9 % (ref 0.0–14.0)
NEUT%: 71.8 % (ref 38.4–76.8)
NEUTROS ABS: 7.7 10*3/uL — AB (ref 1.5–6.5)
Platelets: 368 10*3/uL (ref 145–400)
RBC: 5.09 10*6/uL (ref 3.70–5.45)
RDW: 13.1 % (ref 11.2–14.5)
WBC: 10.7 10*3/uL — ABNORMAL HIGH (ref 3.9–10.3)

## 2015-03-07 LAB — COMPREHENSIVE METABOLIC PANEL (CC13)
ALT: 17 U/L (ref 0–55)
AST: 18 U/L (ref 5–34)
Albumin: 4.1 g/dL (ref 3.5–5.0)
Alkaline Phosphatase: 77 U/L (ref 40–150)
Anion Gap: 11 mEq/L (ref 3–11)
BUN: 12.8 mg/dL (ref 7.0–26.0)
CALCIUM: 11 mg/dL — AB (ref 8.4–10.4)
CHLORIDE: 105 meq/L (ref 98–109)
CO2: 26 meq/L (ref 22–29)
Creatinine: 1.2 mg/dL — ABNORMAL HIGH (ref 0.6–1.1)
EGFR: 42 mL/min/{1.73_m2} — ABNORMAL LOW (ref >90)
Glucose: 106 mg/dl (ref 70–140)
Potassium: 3.9 mEq/L (ref 3.5–5.1)
SODIUM: 143 meq/L (ref 136–145)
TOTAL PROTEIN: 7.1 g/dL (ref 6.4–8.3)
Total Bilirubin: 0.7 mg/dL (ref 0.20–1.20)

## 2015-03-07 MED ORDER — PROCHLORPERAZINE MALEATE 10 MG PO TABS: 10.0000 mg | ORAL_TABLET | Freq: Four times a day (QID) | ORAL | Status: DC | PRN

## 2015-03-07 MED ORDER — LIDOCAINE-PRILOCAINE 2.5-2.5 % EX CREA: TOPICAL_CREAM | CUTANEOUS | Status: DC

## 2015-03-07 MED ORDER — LORAZEPAM 0.5 MG PO TABS: 0.5000 mg | ORAL_TABLET | Freq: Every day | ORAL | Status: DC

## 2015-03-07 MED ORDER — ONDANSETRON HCL 8 MG PO TABS: 8.0000 mg | ORAL_TABLET | Freq: Two times a day (BID) | ORAL | Status: DC | PRN

## 2015-03-07 MED ORDER — DEXAMETHASONE 4 MG PO TABS: 4.0000 mg | ORAL_TABLET | Freq: Two times a day (BID) | ORAL | Status: DC

## 2015-03-07 NOTE — Progress Notes (Signed)
Radiation Oncology         [phone]) [phone] ________________________________  Initial outpatient Consultation  Name: Carla Bennett MRN: [id number]  Date: 03/07/2015  DOB: [birth year]  [id number], MD  Erroll Luna, MD   REFERRING PHYSICIAN: Erroll Luna, MD  DIAGNOSIS: The encounter diagnosis was Breast cancer of upper-outer quadrant of left female breast.  HISTORY OF PRESENT ILLNESS::Carla Bennett is a 80 y.o. female who is seen out of the courtesy of Dr Brantley Stage as part of the multidisciplinary breast clinic. Last year the patient underwent routine screening mammography at the [facility] breast center.  no suspicious areas were noted within either breast.  the patient was examined by her physician earlier this year and was noted to have increased density in the left breast prompting workup. Patient ultimately underwent biopsy of this area of concern which revealed invasive ductal breast cancer, triple negative with a high proliferation marker of 75%. On imaging the lesion of concern measured 2.9 cm. A 1.7 cm lymph node on ultrasound was also biopsied and showed metastatic carcinoma. With this information the patient is now seen for evaluation.Marland Kitchen  PREVIOUS RADIATION THERAPY: No  PAST MEDICAL HISTORY:  has a past medical history of Diverticulosis of colon (without mention of hemorrhage); Benign neoplasm of colon; Diverticulitis; Arthritis; Gout; Hypertension; Allergy; Other issue of medical certificates; Hematuria; abnormal cervical Pap smear; Uterine prolaps; Cystocele; Basal cell carcinoma; Hypercalcemia; Cholelithiasis; History of blood transfusion; Renal disease (3/16); and Breast cancer of upper-outer quadrant of left female breast (02/23/2015).    PAST SURGICAL HISTORY: Past Surgical History  Procedure Laterality Date  . Cystocele repair  08/2010    Rectocele, vault prolapse  . Dilation and curettage of uterus      x2  . Tonsillectomy and adenoidectomy    . Cholecystectomy     . Total vaginal hysterectomy      secondary to prolapse    FAMILY HISTORY: family history includes Alzheimer's disease in her mother; Breast cancer in her sister; Cancer in her maternal uncle; Lung cancer in her father; Multiple sclerosis in her sister; Parkinson's disease in her mother; Prostate cancer in her maternal uncle. There is no history of Colon cancer, Esophageal cancer, Rectal cancer, or Stomach cancer.  SOCIAL HISTORY:  reports that she has never smoked. She has never used smokeless tobacco. She reports that she drinks alcohol. She reports that she does not use illicit drugs.  ALLERGIES: Review of patient's allergies indicates no known allergies.  MEDICATIONS:  Current Outpatient Prescriptions  Medication Sig Dispense Refill  . B Complex Vitamins (VITAMIN B COMPLEX PO) Take by mouth daily.    Marland Kitchen estradiol (ESTRACE) 0.5 MG tablet Take 1/2 tablet daily. 45 tablet 4  . furosemide (LASIX) 20 MG tablet     . labetalol (NORMODYNE) 200 MG tablet Take 200 mg by mouth 2 (two) times daily.  6  . lisinopril (PRINIVIL,ZESTRIL) 40 MG tablet Take 40 tablets by mouth daily.    Marland Kitchen loratadine (CLARITIN) 10 MG tablet Take 10 mg by mouth daily.    . Meloxicam (MOBIC PO) Take 1 tablet by mouth as needed.    . Omega-3 Fatty Acids (FISH OIL) 1200 MG CAPS Take by mouth. Twice daily     No current facility-administered medications for this encounter.    REVIEW OF SYSTEMS:  A 15 point review of systems is documented in the electronic medical record. This was obtained by the nursing staff. However, I reviewed this with the patient to discuss relevant findings and make  appropriate changes.  Denies any pain within the breast area nipple discharge or bleeding. She denies any headaches dizziness or blurred vision. The patient denies any new bony pain.   PHYSICAL EXAM:  Vitals - 1 value per visit [id number]  SYSTOLIC [id number]  DIASTOLIC 67  Pulse 71  Temperature 97.6  Respirations 18  Weight (lb) 153.6    Height 5\' 0"   BMI 30  VISIT REPORT   This is a very pleasant 80 year old female in no acute distress.  She is accompanied by her daughter,  son and daughter-in-law. Examination of the neck and supraclavicular region reveals no evidence of adenopathy. The lungs are clear to auscultation. The heart has a regular rhythm and rate. The right breast examination reveals no palpable mass or nipple discharge. The left breast examination reveals significant thickening in the upper outer aspect of the left breast extending over a 4-5 cm Some bruising is noted in this area from her recent biopsies.    ECOG =0  0 - Asymptomatic (Fully active, able to carry on all predisease activities without restriction)  LABORATORY DATA:  Lab Results  Component Value Date   WBC 10.7* 03/07/2015   HGB 14.5 03/07/2015   HCT 44.0 03/07/2015   MCV 86.5 03/07/2015   PLT 368 03/07/2015   NEUTROABS 7.7* 03/07/2015   Lab Results  Component Value Date   NA 140 03/04/2011   K 5.0 03/04/2011   CL 101 03/04/2011   CO2 32 03/04/2011   GLUCOSE 108* 03/04/2011   CREATININE 1.24* 03/04/2011   CALCIUM 11.4* 03/04/2011      RADIOGRAPHY: Performed at the [facility] breast cancer. Images reviewed and discussed and the multidisciplinary conference     IMPRESSION: Clinical stage II-B high-grade triple negative invasive ductal carcinoma of the left breast (T2, N1, Mx).  The patient would be a good candidate for neoadjuvant, dose dense chemotherapy. She also may be a potential candidate for alliance clinical trial. Patient is interested in breast conservation therapy and will proceed with this treatment approach.  PLAN:1. Port placement followed by neoadjuvant chemotherapy and surgery and radiation therapy. Patient will have a breast MRI for further evaluation of her disease.      ------------------------------------------------  Blair Promise, PhD, MD

## 2015-03-07 NOTE — Patient Instructions (Signed)

## 2015-03-07 NOTE — Therapy (Signed)
[place], [place], [address] Phone: [phone]   Fax:  [phone]  Physical Therapy Evaluation  Patient Details  Name: Carla Bennett MRN: [id number] Date of Birth: Oct 05, 1935 Referring Provider:  Erroll Luna, MD  Encounter Date: 03/07/2015      PT End of Session - 03/07/15 1641    Visit Number 1   Number of Visits 1   PT Start Time 1505   PT Stop Time 1533   PT Time Calculation (min) 28 min   Activity Tolerance Patient tolerated treatment well   Behavior During Therapy [facility] for tasks assessed/performed      Past Medical History  Diagnosis Date  . Diverticulosis of colon (without mention of hemorrhage)   . Benign neoplasm of colon   . Diverticulitis   . Arthritis   . Gout   . Hypertension   . Allergy   . Other issue of medical certificates     uterine myomata  . Hematuria     negative urology work up  . Hx of abnormal cervical Pap smear     LEEP-CIN I, negative margins and ECC  . Uterine prolaps   . Cystocele   . Basal cell carcinoma   . Hypercalcemia   . Cholelithiasis   . History of blood transfusion     x 1 with D&C  . Renal disease 3/16    Stage 3 kidney disease-seeing Dr Buddy Duty and Dr Marval Regal  . Breast cancer of upper-outer quadrant of left female breast 02/23/2015    Past Surgical History  Procedure Laterality Date  . Cystocele repair  08/2010    Rectocele, vault prolapse  . Dilation and curettage of uterus      x2  . Tonsillectomy and adenoidectomy    . Cholecystectomy    . Total vaginal hysterectomy      secondary to prolapse  . Abdominal hysterectomy      There were no vitals filed for this visit.  Visit Diagnosis:  Breast cancer, stage 2, left - Plan: PT plan of care cert/re-cert  Abnormal posture - Plan: PT plan of care cert/re-cert      Subjective Assessment - 03/07/15 1630    Subjective Patient was seen today for a baseline assessment of her  newly diagnosed left breast cancer.   Patient is accompained by: Family member   Pertinent History Patient was diagnosed 02/15/15 with Triple negative grade 3 invasive ductal carcinoma and has a positive axillary lymph node.  Her Ki67 is 75% and the mass is 2.9 cm.   Patient Stated Goals Reduce lymphedema risk and learn post op shoulder ROM goals   Currently in Pain? No/denies            [facility] PT Assessment - 03/07/15 0001    Assessment   Medical Diagnosis Left breast cancer   Onset Date 02/15/15   Precautions   Precautions Other (comment)  Active breast cancer   Restrictions   Weight Bearing Restrictions No   Balance Screen   Has the patient fallen in the past 6 months No   Has the patient had a decrease in activity level because of a fear of falling?  No   Is the patient reluctant to leave their home because of a fear of falling?  No   Home Environment   Living Enviornment Private residence   Living Arrangements Alone   Available Help at Discharge Family   Prior Function   Level of Independence Independent  with basic ADLs   Vocation Retired   Leisure She has not exercised in 4 months but was previously doing water aerobics and walking   Cognition   Overall Cognitive Status Within Functional Limits for tasks assessed   Posture/Postural Control   Posture/Postural Control Postural limitations   Postural Limitations Rounded Shoulders;Forward head   ROM / Strength   AROM / PROM / Strength AROM;Strength   AROM   AROM Assessment Site Shoulder   Right/Left Shoulder Right;Left   Right Shoulder Extension 63 Degrees   Right Shoulder Flexion 150 Degrees   Right Shoulder ABduction 165 Degrees   Right Shoulder Internal Rotation 63 Degrees   Right Shoulder External Rotation 70 Degrees   Left Shoulder Extension 67 Degrees   Left Shoulder Flexion 154 Degrees   Left Shoulder ABduction 151 Degrees   Left Shoulder Internal Rotation 75 Degrees   Left Shoulder External Rotation 89  Degrees   Strength   Overall Strength Within functional limits for tasks performed           LYMPHEDEMA/ONCOLOGY QUESTIONNAIRE - 03/07/15 1636    Type   Cancer Type Left breast cancer   Lymphedema Assessments   Lymphedema Assessments Upper extremities   Right Upper Extremity Lymphedema   10 cm Proximal to Olecranon Process 29.3 cm   Olecranon Process 24.3 cm   10 cm Proximal to Ulnar Styloid Process 22.7 cm   Just Proximal to Ulnar Styloid Process 15.4 cm   Across Hand at [employer] 19.2 cm   At [place] of 2nd Digit 6.6 cm   Left Upper Extremity Lymphedema   10 cm Proximal to Olecranon Process 29.5 cm   Olecranon Process 24.4 cm   10 cm Proximal to Ulnar Styloid Process 22.2 cm   Just Proximal to Ulnar Styloid Process 15.4 cm   Across Hand at [employer] 19.8 cm   At [place] of 2nd Digit 6.6 cm      Patient was instructed today in a home exercise program today for post op shoulder range of motion. These included active assist shoulder flexion in sitting, scapular retraction, wall walking with shoulder abduction, and hands behind head external rotation.  She was encouraged to do these twice a day, holding 3 seconds and repeating 5 times when permitted by her physician.         PT Education - 03/07/15 1640    Education provided Yes   Education Details Lymphedema risk reduction and post op shoulder ROM HEP   Person(s) Educated Patient;Child(ren)   Methods Explanation;Demonstration;Handout   Comprehension Verbalized understanding              Breast Clinic Goals - 03/07/15 1644    Patient will be able to verbalize understanding of pertinent lymphedema risk reduction practices relevant to her diagnosis specifically related to skin care.   Time 1   Period Days   Status Achieved   Patient will be able to return demonstrate and/or verbalize understanding of the post-op home exercise program related to regaining shoulder range of motion.   Time 1   Period Days    Status Achieved   Patient will be able to verbalize understanding of the importance of attending the postoperative After Breast Cancer Class for further lymphedema risk reduction education and therapeutic exercise.   Time 1   Period Days   Status Achieved              Plan - 03/07/15 1641    Clinical Impression Statement  Patient was diagnosed 02/15/15 with Triple negative grade 3 invasive ductal carcinoma and has a positive axillary lymph node.  Her Ki67 is 75% and the mass is 2.9 cm.  She is planning to have neoadjuvant chemotherapy followed by a left lumpectomy and axillary lymph node dissection possibly followed by radiation.  SHe will benefit from post op PT to regain shoulder ROM and strength and to reduce lymphedema risk.   Pt will benefit from skilled therapeutic intervention in order to improve on the following deficits Decreased range of motion;Increased edema;Pain;Impaired UE functional use;Decreased knowledge of precautions;Decreased strength   Rehab Potential Excellent   Clinical Impairments Affecting Rehab Potential none   PT Frequency One time visit   PT Treatment/Interventions Patient/family education;Therapeutic exercise   Consulted and Agree with Plan of Care Patient;Family member/caregiver   Family Member Consulted son, daughter, and daughter-in-law     Patient will follow up at outpatient cancer rehab if needed following surgery.  If the patient requires physical therapy at that time, a specific plan will be dictated and sent to the referring physician for approval. The patient was educated today on appropriate basic range of motion exercises to begin post operatively and the importance of attending the After Breast Cancer class following surgery.  Patient was educated today on lymphedema risk reduction practices as it pertains to recommendations that will benefit the patient immediately following surgery.  She verbalized good understanding.  No additional physical therapy  is indicated at this time.         G-Codes - 03-09-2015 1644    Functional Assessment Tool Used Clinical Judgement   Functional Limitation Self care   Self Care Current Status [phone]) At least 1 percent but less than 20 percent impaired, limited or restricted   Self Care Goal Status (R0413) At least 1 percent but less than 20 percent impaired, limited or restricted   Self Care Discharge Status [phone]) At least 1 percent but less than 20 percent impaired, limited or restricted       Problem List Patient Active Problem List   Diagnosis Date Noted  . Breast cancer of upper-outer quadrant of left female breast 02/23/2015  . Hematochezia 01/06/2013  . Abdominal pain, other specified site 01/06/2013    Annia Friendly, PT 03/09/2015, 4:47 PM  [place], [place], [address] Phone: [phone]   Fax:  [phone]

## 2015-03-07 NOTE — Progress Notes (Signed)
[facility] NOTE  Patient Care Team: Thressa Sheller, MD as PCP - General (Internal Medicine) Erroll Luna, MD as Consulting Physician (General Surgery) Nicholas Lose, MD as Consulting Physician (Hematology and Oncology) Gery Pray, MD as Consulting Physician (Radiation Oncology) Mauro Kaufmann, RN as Registered Nurse Rockwell Germany, RN as Registered Nurse  CHIEF COMPLAINTS/PURPOSE OF CONSULTATION:  Newly diagnosed breast cancer  HISTORY OF PRESENTING ILLNESS:  Carla Bennett 80 y.o. female is here because of recent diagnosis of left breast cancer. She had a breast examination by her family physician who reported palpable abnormality that was then evaluated by mammogram that showed an irregular mass in the left breast measuring 2.9 cm along with abnormal enlarged axillary lymph node. She also had an additional cyst in the left breast. Biopsy of the mass came back as grade 3 invasive ductal carcinoma triple negative disease with a Ki-67 of 75%. Left axillary lymph node biopsy by ultrasound was also positive for breast cancer. She was presented at the multidisciplinary tumor board and she is here today to discuss the treatment plan.  I reviewed her records extensively and collaborated the history with the patient.  SUMMARY OF ONCOLOGIC HISTORY:   Breast cancer of upper-outer quadrant of left female breast   02/15/2015 Mammogram Left breast increased density, mass is irregular with microcalcifications measuring 2.9 cm, cyst in the breast as well as abnormal enlarged lymph nodes   02/20/2015 Initial Diagnosis Left breast biopsy: Invasive ductal carcinoma with DCIS, axillary lymph node biopsy positive, ER 0%, PR 0%, HER-2 negative ratio 1.13 with Ki-67 75%    In terms of breast cancer risk profile:  She menarched at early age of 89 and went to menopause at age 21  She had 4 pregnancy, her first child was born at age 70  She has not received birth control pills.   She was never exposed to fertility medications or hormone replacement therapy.  She has  family history of Breast/GYN/GI cancer Sister age 13 had breast cancer  MEDICAL HISTORY:  Past Medical History  Diagnosis Date  . Diverticulosis of colon (without mention of hemorrhage)   . Benign neoplasm of colon   . Diverticulitis   . Arthritis   . Gout   . Hypertension   . Allergy   . Other issue of medical certificates     uterine myomata  . Hematuria     negative urology work up  . Hx of abnormal cervical Pap smear     LEEP-CIN I, negative margins and ECC  . Uterine prolaps   . Cystocele   . Basal cell carcinoma   . Hypercalcemia   . Cholelithiasis   . History of blood transfusion     x 1 with D&C  . Renal disease 3/16    Stage 3 kidney disease-seeing Dr Buddy Duty and Dr Marval Regal  . Breast cancer of upper-outer quadrant of left female breast 02/23/2015    SURGICAL HISTORY: Past Surgical History  Procedure Laterality Date  . Cystocele repair  08/2010    Rectocele, vault prolapse  . Dilation and curettage of uterus      x2  . Tonsillectomy and adenoidectomy    . Cholecystectomy    . Total vaginal hysterectomy      secondary to prolapse  . Abdominal hysterectomy      SOCIAL HISTORY: History   Social History  . Marital Status: Married    Spouse Name: N/A  . Number of Children: 4  .  Years of Education: N/A   Occupational History  . retired    Social History Main Topics  . Smoking status: Never Smoker   . Smokeless tobacco: Never Used  . Alcohol Use: Yes     Comment: occasionally  . Drug Use: No  . Sexual Activity: No     Comment: TVH   Other Topics Concern  . Not on file   Social History Narrative    FAMILY HISTORY: Family History  Problem Relation Age of Onset  . Breast cancer Sister   . Lung cancer Father   . Parkinson's disease Mother   . Alzheimer's disease Mother     ? diag  . Lung cancer Mother   . Multiple sclerosis Sister   . Colon cancer  Neg Hx   . Esophageal cancer Neg Hx   . Rectal cancer Neg Hx   . Stomach cancer Neg Hx   . Prostate cancer Maternal Uncle   . Cancer Maternal Uncle     stomache    ALLERGIES:  has No Known Allergies.  MEDICATIONS:  Current Outpatient Prescriptions  Medication Sig Dispense Refill  . B Complex Vitamins (VITAMIN B COMPLEX PO) Take by mouth daily.    Marland Kitchen estradiol (ESTRACE) 0.5 MG tablet Take 1/2 tablet daily. 45 tablet 4  . furosemide (LASIX) 20 MG tablet     . labetalol (NORMODYNE) 200 MG tablet Take 200 mg by mouth 2 (two) times daily.  6  . lisinopril (PRINIVIL,ZESTRIL) 40 MG tablet Take 40 tablets by mouth daily.    Marland Kitchen loratadine (CLARITIN) 10 MG tablet Take 10 mg by mouth daily.    . Meloxicam (MOBIC PO) Take 1 tablet by mouth as needed.     No current facility-administered medications for this visit.    REVIEW OF SYSTEMS:   Constitutional: Denies fevers, chills or abnormal night sweats Eyes: Denies blurriness of vision, double vision or watery eyes Ears, nose, mouth, throat, and face: Denies mucositis or sore throat Respiratory: Denies cough, dyspnea or wheezes Cardiovascular: Denies palpitation, chest discomfort or lower extremity swelling Gastrointestinal:  Denies nausea, heartburn or change in bowel habits Skin: Denies abnormal skin rashes Lymphatics: Denies new lymphadenopathy or easy bruising Neurological:Denies numbness, tingling or new weaknesses Behavioral/Psych: Mood is stable, no new changes  Breast:  Denies any palpable lumps or discharge All other systems were reviewed with the patient and are negative.  PHYSICAL EXAMINATION: ECOG PERFORMANCE STATUS: 1 - Symptomatic but completely ambulatory  Filed Vitals:   03/07/15 1200  BP: 146/67  Pulse: 71  Temp: 97.6 F (36.4 C)  Resp: 18   Filed Weights   03/07/15 1200  Weight: 153 lb 9.6 oz (69.673 kg)    GENERAL:alert, no distress and comfortable SKIN: skin color, texture, turgor are normal, no rashes or  significant lesions EYES: normal, conjunctiva are pink and non-injected, sclera clear OROPHARYNX:no exudate, no erythema and lips, buccal mucosa, and tongue normal  NECK: supple, thyroid normal size, non-tender, without nodularity LYMPH:  no palpable lymphadenopathy in the cervical, axillary or inguinal LUNGS: clear to auscultation and percussion with normal breathing effort HEART: regular rate & rhythm and no murmurs and no lower extremity edema ABDOMEN:abdomen soft, non-tender and normal bowel sounds Musculoskeletal:no cyanosis of digits and no clubbing  PSYCH: alert & oriented x 3 with fluent speech NEURO: no focal motor/sensory deficits BREASTpalpable area of tenderness in thickness where she had breast biopsy       . No palpable axillary or supraclavicular lymphadenopathy (exam performed in  the presence of a chaperone)   LABORATORY DATA:  I have reviewed the data as listed Lab Results  Component Value Date   WBC 10.7* 03/07/2015   HGB 14.5 03/07/2015   HCT 44.0 03/07/2015   MCV 86.5 03/07/2015   PLT 368 03/07/2015   Lab Results  Component Value Date   NA 143 03/07/2015   K 3.9 03/07/2015   CL 101 03/04/2011   CO2 26 03/07/2015    RADIOGRAPHIC STUDIES: I have personally reviewed the radiological reports and agreed with the findings in the report. Results summarized as above  ASSESSMENT AND PLAN:  Breast cancer of upper-outer quadrant of left female breast Left breast invasive ductal carcinoma grade 3 ER 0% PR 0% HER-2 negative Ki-67 75%, 2.9 cm mass with microcalcifications plus abnormal lymph nodes biopsy-proven to be breast cancer T2 N1 M0 stage IIB clinical stage  Pathology and radiology counseling:Discussed with the patient, the details of pathology including the type of breast cancer,the clinical staging, the significance of ER, PR and HER-2/neu receptors and the implications for treatment. After reviewing the pathology in detail, we proceeded to discuss the different  treatment options between surgery, radiation, chemotherapy.  Recommendation:  1. Neoadjuvant chemotherapy:  Dose dense Adriamycin and Cytoxan 4 followed by Abraxane weekly 12 2. Consideration for [facility] clinical trial for surgery 3. Followed by adjuvant radiation   Plan: 1.  Port placement 2.  Echocardiogram 3.  Chemotherapy class 4.  Breast MRI 5.  Staging PET/CT  Start chemotherapy 03/21/2015  Chemotherapy counseling:I have discussed the risks and benefits of chemotherapy including the risks of nausea/ vomiting, risk of infection from low WBC count, fatigue due to chemo or anemia, bruising or bleeding due to low platelets, mouth sores, loss/ change in taste and decreased appetite. Liver and kidney function will be monitored through out chemotherapy as abnormalities in liver and kidney function may be a side effect of treatment. Cardiac dysfunction due to Adriamycin was discussed in detail. Risk of permanent bone marrow dysfunction and leukemia due to chemo were also discussed.  Return to clinic in June 1 to start chemotherapy  All questions were answered. The patient knows to call the clinic with any problems, questions or concerns.    Rulon Eisenmenger, MD 3:36 PM

## 2015-03-07 NOTE — H&P (Signed)
Carla Bennett [id number] AM Location: [facility] Patient #: [id number] DOB: 17-Feb-1935 Undefined / Language: Suszanne Bennett / Race: Undefined Female History of Present Illness Carla Bennett A. [name] MD; 03/07/2015 3:40 PM) Patient words: pt sent at the request of Dr [name] for left breast cancer diagnosed by mammogram, U/S AND core biopsy. Pt denies any abnormality with her breasts. No pain or nipple discharge noted bilaterally. Mammogram shows a 2.9 cm triple negative IDC left UOQ.  The patient is a 80 year old female   Other Problems Carla Bennett [place], [place]; 03/07/2015 7:57 AM) Arthritis Back Pain Bladder Problems Breast Cancer Cholelithiasis Diverticulosis Heart murmur High blood pressure Hypercholesterolemia Lump In Breast Transfusion history  Past Surgical History Carla Bennett, RMA; 03/07/2015 7:57 AM) Breast Biopsy Bilateral. Colon Polyp Removal - Colonoscopy Gallbladder Surgery - Laparoscopic Hysterectomy (not due to cancer) - Partial Oral Surgery Sentinel Lymph Node Biopsy Tonsillectomy  Diagnostic Studies History Carla Bennett [name], RMA; 03/07/2015 7:57 AM) Colonoscopy 1-5 years ago Mammogram within last year Pap Smear >5 years ago  Social History Carla Bennett [name], RMA; 03/07/2015 7:57 AM) Alcohol use Occasional alcohol use. Caffeine use Coffee, Tea. No drug use Tobacco use Never smoker.  Family History Carla Bennett [name], [place]; 03/07/2015 7:57 AM) Breast Cancer Sister. Cancer Family Members In General. Prostate Cancer Family Members In General. Respiratory Condition Father.  Pregnancy / Birth History Carla Bennett [name], [place]; 03/07/2015 7:57 AM) Age at menarche 5 years. Age of menopause 22-55 Gravida 6 Irregular periods Maternal age 77-25 Para 4     Review of Systems Carla Bennett [place] RMA; 03/07/2015 7:57 AM) General Present- Fatigue and Night Sweats. Not Present- Appetite Loss, Chills, Fever, Weight Gain and Weight  Loss. Skin Present- Dryness. Not Present- Change in Wart/Mole, Hives, Jaundice, New Lesions, Non-Healing Wounds, Rash and Ulcer. HEENT Present- Hoarseness, Nose Bleed, Ringing in the Ears, Seasonal Allergies, Sore Throat and Wears glasses/contact lenses. Not Present- Earache, Hearing Loss, Oral Ulcers, Sinus Pain, Visual Disturbances and Yellow Eyes. Respiratory Present- Snoring. Not Present- Bloody sputum, Chronic Cough, Difficulty Breathing and Wheezing. Breast Present- Breast Mass. Not Present- Breast Pain, Nipple Discharge and Skin Changes. Cardiovascular Present- Leg Cramps and Palpitations. Not Present- Chest Pain, Difficulty Breathing Lying Down, Rapid Heart Rate, Shortness of Breath and Swelling of Extremities. Gastrointestinal Present- Constipation and Gets full quickly at meals. Not Present- Abdominal Pain, Bloating, Bloody Stool, Change in Bowel Habits, Chronic diarrhea, Difficulty Swallowing, Excessive gas, Hemorrhoids, Indigestion, Nausea, Rectal Pain and Vomiting. Female Genitourinary Not Present- Frequency, Nocturia, Painful Urination, Pelvic Pain and Urgency. Musculoskeletal Present- Back Pain, Joint Pain, Joint Stiffness, Muscle Pain, Muscle Weakness and Swelling of Extremities. Neurological Not Present- Decreased Memory, Fainting, Headaches, Numbness, Seizures, Tingling, Tremor, Trouble walking and Weakness. Psychiatric Present- Anxiety. Not Present- Bipolar, Change in Sleep Pattern, Depression, Fearful and Frequent crying. Endocrine Present- Cold Intolerance and Hot flashes. Not Present- Excessive Hunger, Hair Changes, Heat Intolerance and New Diabetes. Hematology Not Present- Easy Bruising, Excessive bleeding, Gland problems, HIV and Persistent Infections.   Physical Exam (Garhett Bennett A. Carla Frankum MD; 03/07/2015 3:39 PM)  General Mental Status-Alert. General Appearance-Consistent with stated age. Hydration-Well hydrated. Voice-Normal.  Head and Neck Head-normocephalic,  atraumatic with no lesions or palpable masses. Trachea-midline. Thyroid Gland Characteristics - normal size and consistency.  Eye Eyeball - Bilateral-Extraocular movements intact. Sclera/Conjunctiva - Bilateral-No scleral icterus.  Chest and Lung Exam Chest and lung exam reveals -quiet, even and easy respiratory effort with no use of accessory muscles and on auscultation, normal breath sounds, no adventitious sounds and normal vocal resonance. Inspection  Chest Wall - Normal. Back - normal.  Breast Breast - Left-Symmetric, Non Tender, No Biopsy scars, no Dimpling, No Inflammation, No Lumpectomy scars, No Mastectomy scars, No Peau d' Orange. Breast - Right-Symmetric, Non Tender, No Biopsy scars, no Dimpling, No Inflammation, No Lumpectomy scars, No Mastectomy scars, No Peau d' Orange. Breast Lump-No Palpable Breast Mass. Note: left breast bruising noted   Cardiovascular Cardiovascular examination reveals -normal heart sounds, regular rate and rhythm with no murmurs and normal pedal pulses bilaterally.  Abdomen Inspection Inspection of the abdomen reveals - No Hernias. Skin - Scar - no surgical scars. Palpation/Percussion Palpation and Percussion of the abdomen reveal - Soft, Non Tender, No Rebound tenderness, No Rigidity (guarding) and No hepatosplenomegaly. Auscultation Auscultation of the abdomen reveals - Bowel sounds normal.  Neurologic Neurologic evaluation reveals -alert and oriented x 3 with no impairment of recent or remote memory. Mental Status-Normal.  Musculoskeletal Normal Exam - Left-Upper Extremity Strength Normal and Lower Extremity Strength Normal. Normal Exam - Right-Upper Extremity Strength Normal and Lower Extremity Strength Normal.  Lymphatic Head & Neck  General Head & Neck Lymphatics: Bilateral - Description - Normal. Axillary  General Axillary Region: Left - Size - 2.0 cm. Consistency - Firm. Mobility - Mobile. Bilateral -  Tenderness - Non Tender. Femoral & Inguinal  Generalized Femoral & Inguinal Lymphatics: Bilateral - Description - Normal. Tenderness - Non Tender.    Assessment & Plan (Shandreka Dante A. Devany Aja MD; 03/07/2015 3:41 PM)  BREAST CANCER, STAGE 2, LEFT (174.9  C50.912) Impression: this is a triple negative breast cancer with lymph node disease. Seen oncology and discussed role of neoadjuvant chemotherapy and she is agreeable with this. Discussed port placement for chemotherapy as well. Risk of bleeding, infection, collapse lung, organ injury, migration, malfunction and the need for other operative procedures. She would like to pursue breast conservation and may want to participate in [facility] for lymph node management.

## 2015-03-07 NOTE — Progress Notes (Signed)
Checked in new pt with no financial concerns.  Pt has 2 insurances so financial assistance may not be needed but she has my card for any billing questions or concerns.  ° °

## 2015-03-07 NOTE — Telephone Encounter (Signed)
Gave avs & calendar for May. °

## 2015-03-07 NOTE — Telephone Encounter (Signed)
Called and left a message with her chemo ed class and will call once recd precert from [facility] for echo,she was also left the number for [facility] for mri

## 2015-03-07 NOTE — Progress Notes (Signed)
[facility] Psychosocial Distress Screening Spiritual Care  Met with Carla Bennett, son Carla Bennett, and DIL Carla Bennett at [facility] to introduce Spiritual Care and [facility] team/resources, and to review distress screen per protocol.  The patient scored a 8 on the Psychosocial Distress Thermometer which indicates severe distress. Also assessed for distress and other psychosocial needs.   ONCBCN DISTRESS SCREENING 03/07/2015  Screening Type Initial Screening  Distress experienced in past week (1-10) 8  Practical problem type Housing;Food  Family Problem type Children  Emotional problem type Nervousness/Anxiety;Adjusting to illness  Spiritual/Religous concerns type Facing my mortality  Information Concerns Type Lack of info about maintaining fitness  Physical Problem type Sleep/insomnia;Loss of appetitie;Constipation/diarrhea  Referral to support programs Yes  Other Carla Bennett is navigating living alone since her husband's death; she indicated "housing" and "food" as stressors because she has her house on the market (downsizing from 5 bdrm/big yard to [employer] yard) and because she tires of cooking for one.  After [name], pt describes herself as "overwhelmed, but more confident in the procedure," explaining that she's "someone who likes to know everything [all the details]," but "not as hopeful because it's worse than I expected."  She also noted that the locus of her hope is inward, rather than because of what doctors are telling her; reflected this back to her as a strength that will help her cope.   Per pt, she uses music and humor to cope, and she has good support from family (excitement:  granddaughter's baby is due soon).  Marlowe Kays and family used visit well to share/process story and to identify support resources that they would like to use.  Provided pastoral presence, reflective listening, affirmation of coping tools, normalization of feelings, and encouragement.  Follow up needed: Yes.  Plan  to f/u by phone or notecard for further support, but please also page as needs arise.  Thank you.  [employer], [place]

## 2015-03-07 NOTE — Assessment & Plan Note (Addendum)
Left breast invasive ductal carcinoma grade 3 ER 0% PR 0% HER-2 negative Ki-67 75%, 2.9 cm mass with microcalcifications plus abnormal lymph nodes biopsy-proven to be breast cancer T2 N1 M0 stage IIB clinical stage  Pathology and radiology counseling:Discussed with the patient, the details of pathology including the type of breast cancer,the clinical staging, the significance of ER, PR and HER-2/neu receptors and the implications for treatment. After reviewing the pathology in detail, we proceeded to discuss the different treatment options between surgery, radiation, chemotherapy.  Recommendation:  1. Neoadjuvant chemotherapy:  Dose dense Adriamycin and Cytoxan 4 followed by Abraxane weekly 12 2. Consideration for [facility] clinical trial for surgery 3. Followed by adjuvant radiation   Plan: 1.  Port placement 2.  Echocardiogram 3.  Chemotherapy class 4.  Breast MRI   Start chemotherapy 03/21/2015  chemotherapy counseling:I have discussed the risks and benefits of chemotherapy including the risks of nausea/ vomiting, risk of infection from low WBC count, fatigue due to chemo or anemia, bruising or bleeding due to low platelets, mouth sores, loss/ change in taste and decreased appetite. Liver and kidney function will be monitored through out chemotherapy as abnormalities in liver and kidney function may be a side effect of treatment. Cardiac dysfunction due to Adriamycin was discussed in detail. Risk of permanent bone marrow dysfunction and leukemia due to chemo were also discussed.  Return to clinic in June 1 to start chemotherapy

## 2015-03-07 NOTE — Progress Notes (Signed)
Medical Records requested from [facility] at [place] Dr. Marval Regal.  Reviewed by Dr. Lindi Adie.  Sent to scan.

## 2015-03-08 ENCOUNTER — Ambulatory Visit (HOSPITAL_BASED_OUTPATIENT_CLINIC_OR_DEPARTMENT_OTHER): Payer: Medicare Other | Admitting: Genetic Counselor

## 2015-03-08 ENCOUNTER — Other Ambulatory Visit (HOSPITAL_BASED_OUTPATIENT_CLINIC_OR_DEPARTMENT_OTHER): Payer: Medicare Other

## 2015-03-08 DIAGNOSIS — Z803 Family history of malignant neoplasm of breast: Secondary | ICD-10-CM | POA: Insufficient documentation

## 2015-03-08 DIAGNOSIS — C50412 Malignant neoplasm of upper-outer quadrant of left female breast: Secondary | ICD-10-CM

## 2015-03-08 DIAGNOSIS — C50912 Malignant neoplasm of unspecified site of left female breast: Secondary | ICD-10-CM

## 2015-03-08 DIAGNOSIS — Z315 Encounter for genetic counseling: Secondary | ICD-10-CM | POA: Diagnosis present

## 2015-03-08 DIAGNOSIS — C50919 Malignant neoplasm of unspecified site of unspecified female breast: Secondary | ICD-10-CM | POA: Insufficient documentation

## 2015-03-08 DIAGNOSIS — Z800 Family history of malignant neoplasm of digestive organs: Secondary | ICD-10-CM | POA: Insufficient documentation

## 2015-03-08 LAB — CBC WITH DIFFERENTIAL/PLATELET
BASO%: 1.2 % (ref 0.0–2.0)
Basophils Absolute: 0.1 10*3/uL (ref 0.0–0.1)
EOS%: 3.2 % (ref 0.0–7.0)
Eosinophils Absolute: 0.3 10*3/uL (ref 0.0–0.5)
HEMATOCRIT: 44.3 % (ref 34.8–46.6)
HGB: 14.8 g/dL (ref 11.6–15.9)
LYMPH%: 24.3 % (ref 14.0–49.7)
MCH: 28.8 pg (ref 25.1–34.0)
MCHC: 33.5 g/dL (ref 31.5–36.0)
MCV: 86 fL (ref 79.5–101.0)
MONO#: 0.7 10*3/uL (ref 0.1–0.9)
MONO%: 6.4 % (ref 0.0–14.0)
NEUT#: 6.6 10*3/uL — ABNORMAL HIGH (ref 1.5–6.5)
NEUT%: 64.9 % (ref 38.4–76.8)
PLATELETS: 380 10*3/uL (ref 145–400)
RBC: 5.16 10*6/uL (ref 3.70–5.45)
RDW: 13.3 % (ref 11.2–14.5)
WBC: 10.2 10*3/uL (ref 3.9–10.3)
lymph#: 2.5 10*3/uL (ref 0.9–3.3)

## 2015-03-08 LAB — COMPREHENSIVE METABOLIC PANEL (CC13)
ALBUMIN: 4.2 g/dL (ref 3.5–5.0)
ALK PHOS: 79 U/L (ref 40–150)
ALT: 16 U/L (ref 0–55)
AST: 20 U/L (ref 5–34)
Anion Gap: 11 mEq/L (ref 3–11)
BILIRUBIN TOTAL: 0.8 mg/dL (ref 0.20–1.20)
BUN: 14 mg/dL (ref 7.0–26.0)
CO2: 25 mEq/L (ref 22–29)
Calcium: 10.9 mg/dL — ABNORMAL HIGH (ref 8.4–10.4)
Chloride: 104 mEq/L (ref 98–109)
Creatinine: 1.2 mg/dL — ABNORMAL HIGH (ref 0.6–1.1)
EGFR: 45 mL/min/{1.73_m2} — AB (ref >90)
Glucose: 93 mg/dl (ref 70–140)
POTASSIUM: 3.7 meq/L (ref 3.5–5.1)
Sodium: 141 mEq/L (ref 136–145)
Total Protein: 7.3 g/dL (ref 6.4–8.3)

## 2015-03-08 NOTE — Progress Notes (Signed)
[facility] Patient Visit  REFERRING PROVIDER: Thressa Sheller, MD [address], [place], [place] [address]  PRIMARY PROVIDER:  Thressa Sheller, MD  PRIMARY REASON FOR VISIT:  1. Breast cancer, left   2. Family history of malignant neoplasm of gastrointestinal tract   3. Family history of malignant neoplasm of breast     HISTORY OF PRESENT ILLNESS:   Carlis Stable Badgett, a 80 y.o. female, was seen for a [facility] cancer genetics consultation at the request of Dr. Noah Delaine due to a personal and family history of cancer.  Ms. Yim presents to clinic today to discuss the possibility of a hereditary predisposition to cancer, genetic testing, and to further clarify her future cancer risks, as well as potential cancer risks for family members.   CANCER HISTORY:    Breast cancer of upper-outer quadrant of left female breast   02/15/2015 Mammogram Left breast increased density, mass is irregular with microcalcifications measuring 2.9 cm, cyst in the breast as well as abnormal enlarged lymph nodes   02/20/2015 Initial Diagnosis Left breast biopsy: Invasive ductal carcinoma with DCIS, axillary lymph node biopsy positive, ER 0%, PR 0%, HER-2 negative ratio 1.13 with Ki-67 75%    Past Medical History  Diagnosis Date   Diverticulosis of colon (without mention of hemorrhage)    Benign neoplasm of colon    Diverticulitis    Arthritis    Gout    Hypertension    Allergy    Other issue of medical certificates     uterine myomata   Hematuria     negative urology work up   Hx of abnormal cervical Pap smear     LEEP-CIN I, negative margins and ECC   Uterine prolaps    Cystocele    Basal cell carcinoma    Hypercalcemia    Cholelithiasis    History of blood transfusion     x 1 with D&C   Renal disease 3/16    Stage 3 kidney disease-seeing Dr Buddy Duty and Dr Marval Regal   Breast cancer of upper-outer quadrant of left female  breast 02/23/2015    Past Surgical History  Procedure Laterality Date   Cystocele repair  08/2010    Rectocele, vault prolapse   Dilation and curettage of uterus      x2   Tonsillectomy and adenoidectomy     Cholecystectomy     Total vaginal hysterectomy      secondary to prolapse   Abdominal hysterectomy      History   Social History   Marital Status: Married    Spouse Name: N/A   Number of Children: 4   Years of Education: N/A   Occupational History   retired    Social History Main Topics   Smoking status: Never Smoker    Smokeless tobacco: Never Used   Alcohol Use: Yes     Comment: occasionally   Drug Use: No   Sexual Activity: No     Comment: TVH   Other Topics Concern   Not on file   Social History Narrative     FAMILY HISTORY:  During the visit, a 4-generation pedigree was obtained. A copy of the pedigree with be scanned into Epic under the Media tab. Significant family history diagnoses include the following: Family History  Problem Relation Age of Onset   Breast cancer Sister 64   Lung cancer Father 89   Parkinson's disease Mother    Alzheimer's disease Mother     ?  diag   Lung cancer Mother    Multiple sclerosis Sister    Colon cancer Neg Hx    Esophageal cancer Neg Hx    Rectal cancer Neg Hx    Stomach cancer Neg Hx    Prostate cancer Maternal Uncle 63   Cancer Maternal Uncle     stomache   Cancer Maternal Uncle 65    GI cancer - ? stomach   Cancer Cousin 46    mat female first cousin (son of mat uncle with GI cancer) with ureter cancer   Ms. Narvaiz's ancestry is of Caucasian descent. There is no known Jewish ancestry or consanguinity.  GENETIC COUNSELING ASSESSMENT:  Ms. Dreier is a 80 y.o. female with a personal and family history of cancer suggestive of a hereditary predisposition to cancer. We, therefore, discussed and recommended the following at today's visit.   DISCUSSION:  We reviewed the  characteristics, features and inheritance patterns of hereditary cancer syndromes. We also discussed genetic testing, including the appropriate family members to test, the process of testing, insurance coverage and turn-around-time for results. We discussed the implications of a negative, positive and/or variant of uncertain significant result. We recommended Ms. Frith pursue genetic testing for the Ovanext gene panel. The OvaNext gene panel offered by [employer] includes sequencing and rearrangement analysis for the following 24 genes:ATM, BARD1, BRCA1, BRCA2, BRIP1, CDH1, CHEK2, EPCAM, MLH1, MRE11A, MSH2, MSH6, MUTYH, NBN, NF1, PALB2, PMS2, PTEN, RAD50, RAD51C, RAD51D, SMARCA4, STK11, and TP53.   PLAN:  Based on our above recommendation, Ms. Wedekind wished to pursue genetic testing and the blood sample was drawn and will be sent to [facility] for analysis. Results should be available within approximately 4 weeks time, at which point they will be disclosed by telephone to Ms. Syfert, as will any additional recommendations warranted by these results. Lastly, we encouraged Ms. Cala to remain in contact with cancer genetics annually so that we can continuously update the family history and inform her of any changes in cancer genetics and testing that may be of benefit for this family.   Ms.  Dominique's questions were answered to her satisfaction today. Our contact information was provided should additional questions or concerns arise. Thank you for the referral and allowing [place] to share in the care of your patient.   Catherine A. Fine, MS, CGC Certified Psychologist, sport [email or web address] phone: [phone]  The patient was seen for a total of 55 minutes in face-to-face genetic counseling.  This patient was discussed with Dr. Lindi Adie who agrees with the above.    ______________________________________________________________________ For Office Staff:  Number of people  involved in session including genetic counselor: 3 Was an intern or student involved with case: not applicable

## 2015-03-09 ENCOUNTER — Telehealth: Payer: Self-pay | Admitting: Hematology and Oncology

## 2015-03-09 ENCOUNTER — Telehealth: Payer: Self-pay

## 2015-03-09 ENCOUNTER — Telehealth: Payer: Self-pay | Admitting: *Deleted

## 2015-03-09 ENCOUNTER — Other Ambulatory Visit: Payer: Self-pay | Admitting: *Deleted

## 2015-03-09 ENCOUNTER — Encounter: Payer: Self-pay | Admitting: *Deleted

## 2015-03-09 DIAGNOSIS — C50412 Malignant neoplasm of upper-outer quadrant of left female breast: Secondary | ICD-10-CM

## 2015-03-09 MED ORDER — LIDOCAINE-PRILOCAINE 2.5-2.5 % EX CREA
TOPICAL_CREAM | CUTANEOUS | Status: DC
Start: 2015-03-09 — End: 2015-08-16

## 2015-03-09 NOTE — Progress Notes (Signed)
MD note created during office visit sent to scan.  Copy to patient.   

## 2015-03-09 NOTE — Telephone Encounter (Signed)
Called patient and she is aware of her 5/24 appointment

## 2015-03-09 NOTE — Telephone Encounter (Signed)
Order faxed to [facility] for EMLA cream.  Sent to scan.

## 2015-03-09 NOTE — Telephone Encounter (Signed)
Per staff message and POF I have scheduled appts. Advised scheduler of appts. JMW  

## 2015-03-09 NOTE — Telephone Encounter (Signed)
TC regarding order clarification for pt's EMLA cream ref # [id number]. Spoke with pharmacy tech and pharmacist and prescription clarified.

## 2015-03-09 NOTE — Telephone Encounter (Signed)
No available on 6/1 moved to 6/2

## 2015-03-09 NOTE — Telephone Encounter (Signed)
Left message to confirm ECHO

## 2015-03-12 ENCOUNTER — Telehealth: Payer: Self-pay | Admitting: *Deleted

## 2015-03-12 NOTE — Telephone Encounter (Signed)
Left vm for pt to return call regarding [place] from 03/07/15.

## 2015-03-12 NOTE — Assessment & Plan Note (Signed)
Left breast invasive ductal carcinoma grade 3 ER 0% PR 0% HER-2 negative Ki-67 75%, 2.9 cm mass with microcalcifications plus abnormal lymph nodes biopsy-proven to be breast cancer T2 N1 M0 stage IIB clinical stage  Treatment Plan:  1. Neoadjuvant chemotherapy: Dose dense Adriamycin and Cytoxan 4 followed by Abraxane weekly 12 2. Consideration for Alliance clinical trial for surgery 3. Followed by adjuvant radiation  Current Treatment: Dose dense Adriamycin and Cytoxan Cycle 1 day 1 Port, ECHO, PET and Breast MRI pending.  RTC in 1 week for tox check

## 2015-03-13 ENCOUNTER — Encounter: Payer: Self-pay | Admitting: General Practice

## 2015-03-13 ENCOUNTER — Ambulatory Visit (HOSPITAL_COMMUNITY)
Admission: RE | Admit: 2015-03-13 | Discharge: 2015-03-13 | Disposition: A | Discharge status: Home / Self Care | Payer: Medicare Other | Source: Ambulatory Visit | Attending: Adult Health | Admitting: Adult Health

## 2015-03-13 ENCOUNTER — Ambulatory Visit (HOSPITAL_BASED_OUTPATIENT_CLINIC_OR_DEPARTMENT_OTHER): Payer: Medicare Other | Admitting: Hematology and Oncology

## 2015-03-13 ENCOUNTER — Other Ambulatory Visit (HOSPITAL_BASED_OUTPATIENT_CLINIC_OR_DEPARTMENT_OTHER): Payer: Medicare Other

## 2015-03-13 ENCOUNTER — Telehealth: Payer: Self-pay | Admitting: Hematology and Oncology

## 2015-03-13 ENCOUNTER — Other Ambulatory Visit: Payer: Medicare Other

## 2015-03-13 VITALS — BP 197/86 | HR 76 | Temp 97.7°F | Resp 18 | Ht 60.0 in | Wt 152.6 lb

## 2015-03-13 DIAGNOSIS — N182 Chronic kidney disease, stage 2 (mild): Secondary | ICD-10-CM | POA: Diagnosis not present

## 2015-03-13 DIAGNOSIS — C50412 Malignant neoplasm of upper-outer quadrant of left female breast: Secondary | ICD-10-CM

## 2015-03-13 DIAGNOSIS — Z01810 Encounter for preprocedural cardiovascular examination: Secondary | ICD-10-CM | POA: Diagnosis not present

## 2015-03-13 DIAGNOSIS — I083 Combined rheumatic disorders of mitral, aortic and tricuspid valves: Secondary | ICD-10-CM | POA: Insufficient documentation

## 2015-03-13 DIAGNOSIS — C773 Secondary and unspecified malignant neoplasm of axilla and upper limb lymph nodes: Secondary | ICD-10-CM | POA: Diagnosis not present

## 2015-03-13 LAB — COMPREHENSIVE METABOLIC PANEL (CC13)
ALT: 16 U/L (ref 0–55)
AST: 19 U/L (ref 5–34)
Albumin: 4 g/dL (ref 3.5–5.0)
Alkaline Phosphatase: 70 U/L (ref 40–150)
Anion Gap: 12 mEq/L — ABNORMAL HIGH (ref 3–11)
BUN: 15.6 mg/dL (ref 7.0–26.0)
CO2: 23 mEq/L (ref 22–29)
Calcium: 10.6 mg/dL — ABNORMAL HIGH (ref 8.4–10.4)
Chloride: 108 mEq/L (ref 98–109)
Creatinine: 1.3 mg/dL — ABNORMAL HIGH (ref 0.6–1.1)
EGFR: 40 mL/min/{1.73_m2} — AB (ref >90)
GLUCOSE: 101 mg/dL (ref 70–140)
Potassium: 3.8 mEq/L (ref 3.5–5.1)
Sodium: 143 mEq/L (ref 136–145)
Total Bilirubin: 0.54 mg/dL (ref 0.20–1.20)
Total Protein: 6.8 g/dL (ref 6.4–8.3)

## 2015-03-13 LAB — CBC WITH DIFFERENTIAL/PLATELET
BASO%: 0.6 % (ref 0.0–2.0)
Basophils Absolute: 0.1 10*3/uL (ref 0.0–0.1)
EOS ABS: 0.4 10*3/uL (ref 0.0–0.5)
EOS%: 4.6 % (ref 0.0–7.0)
HEMATOCRIT: 42.7 % (ref 34.8–46.6)
HGB: 14.2 g/dL (ref 11.6–15.9)
LYMPH%: 25.3 % (ref 14.0–49.7)
MCH: 28.9 pg (ref 25.1–34.0)
MCHC: 33.3 g/dL (ref 31.5–36.0)
MCV: 86.8 fL (ref 79.5–101.0)
MONO#: 0.6 10*3/uL (ref 0.1–0.9)
MONO%: 6.8 % (ref 0.0–14.0)
NEUT%: 62.7 % (ref 38.4–76.8)
NEUTROS ABS: 5.1 10*3/uL (ref 1.5–6.5)
Platelets: 340 10*3/uL (ref 145–400)
RBC: 4.92 10*6/uL (ref 3.70–5.45)
RDW: 13 % (ref 11.2–14.5)
WBC: 8.1 10*3/uL (ref 3.9–10.3)
lymph#: 2.1 10*3/uL (ref 0.9–3.3)

## 2015-03-13 NOTE — Telephone Encounter (Signed)
Appointments made and avs printed for patient °

## 2015-03-13 NOTE — Progress Notes (Signed)
Spiritual Care Note  Carla Bennett and her two children greeted me in lobby today, and then we connected again in chemo class.  Provided pastoral, reassuring presence and emotional support as they took this next step in preparing for treatment.  Son Marden Noble also visited [facility] seeking resources to guide family's support of a patient.  Met with him briefly, offering further appointment(s) as desired, also suggesting [facility] as resources. He verbalized appreciation, noting that print materials may fit his need best at this time.  Pt and family aware of ongoing chaplain availability.  Will follow, but please also page as needs arise.  Thank you.  [place], [place]

## 2015-03-13 NOTE — Progress Notes (Signed)
Patient Care Team: Thressa Sheller, MD as PCP - General (Internal Medicine) Erroll Luna, MD as Consulting Physician (General Surgery) Nicholas Lose, MD as Consulting Physician (Hematology and Oncology) Gery Pray, MD as Consulting Physician (Radiation Oncology) Mauro Kaufmann, RN as Registered Nurse Rockwell Germany, RN as Registered Nurse  DIAGNOSIS: Breast cancer of upper-outer quadrant of left female breast   Staging form: Breast, AJCC 7th Edition     Clinical stage from 03/07/2015: Stage IIB (T2, N1, M0) - Unsigned       Staging comments: Staged at breast conference on 5.18.16    SUMMARY OF ONCOLOGIC HISTORY:   Breast cancer of upper-outer quadrant of left female breast   02/15/2015 Mammogram Left breast increased density, mass is irregular with microcalcifications measuring 2.9 cm, cyst in the breast as well as abnormal enlarged lymph nodes   02/20/2015 Initial Diagnosis Left breast biopsy: Invasive ductal carcinoma with DCIS, axillary lymph node biopsy positive, ER 0%, PR 0%, HER-2 negative ratio 1.13 with Ki-67 75%    CHIEF COMPLIANT: Follow-up to discuss treatment plan  INTERVAL HISTORY: Carla Bennett is a 80 year old with above-mentioned history of left breast cancer currently being planned for neoadjuvant chemotherapy. She is getting chemotherapy education as well as echocardiogram today. Her breast MRI and PET CTs have been scheduled for later this week. She plans to start chemotherapy on June 2. Her port is going to be inserted on May 31. She is accompanied by her family to discuss the treatment plan more detail.  REVIEW OF SYSTEMS:   Constitutional: Denies fevers, chills or abnormal weight loss Eyes: Denies blurriness of vision Ears, nose, mouth, throat, and face: Denies mucositis or sore throat Respiratory: Denies cough, dyspnea or wheezes Cardiovascular: Denies palpitation, chest discomfort or lower extremity swelling Gastrointestinal:  Denies nausea, heartburn or  change in bowel habits Skin: Denies abnormal skin rashes Lymphatics: Denies new lymphadenopathy or easy bruising Neurological:Denies numbness, tingling or new weaknesses Behavioral/Psych: Mood is stable, no new changes   All other systems were reviewed with the patient and are negative.  I have reviewed the past medical history, past surgical history, social history and family history with the patient and they are unchanged from previous note.  ALLERGIES:  has No Known Allergies.  MEDICATIONS:  Current Outpatient Prescriptions  Medication Sig Dispense Refill  . B Complex Vitamins (VITAMIN B COMPLEX PO) Take by mouth daily.    Marland Kitchen dexamethasone (DECADRON) 4 MG tablet Take 1 tablet (4 mg total) by mouth 2 (two) times daily. Take 2 tablets by mouth once a day on the day after chemotherapy and then take 2 tablets two times a day for 2 days. Take with food. 30 tablet 1  . estradiol (ESTRACE) 0.5 MG tablet Take 1/2 tablet daily. 45 tablet 4  . furosemide (LASIX) 20 MG tablet     . labetalol (NORMODYNE) 200 MG tablet Take 200 mg by mouth 2 (two) times daily.  6  . lidocaine-prilocaine (EMLA) cream Apply to port-a-cath 1-2 hours prior to access. 30 g 3  . lisinopril (PRINIVIL,ZESTRIL) 40 MG tablet Take 40 tablets by mouth daily.    Marland Kitchen loratadine (CLARITIN) 10 MG tablet Take 10 mg by mouth daily.    Marland Kitchen LORazepam (ATIVAN) 0.5 MG tablet Take 1 tablet (0.5 mg total) by mouth at bedtime. 30 tablet 0  . Meloxicam (MOBIC PO) Take 1 tablet by mouth as needed.    . ondansetron (ZOFRAN) 8 MG tablet Take 1 tablet (8 mg total) by mouth 2 (  two) times daily as needed. Start on the third day after chemotherapy. 30 tablet 1  . prochlorperazine (COMPAZINE) 10 MG tablet Take 1 tablet (10 mg total) by mouth every 6 (six) hours as needed (Nausea or vomiting). 30 tablet 1   No current facility-administered medications for this visit.    PHYSICAL EXAMINATION: ECOG PERFORMANCE STATUS: 1 - Symptomatic but completely  ambulatory  Filed Vitals:   03/13/15 0826  BP: 197/86  Pulse: 76  Temp: 97.7 F (36.5 C)  Resp: 18   Filed Weights   03/13/15 0826  Weight: 152 lb 9.6 oz (69.219 kg)    GENERAL:alert, no distress and comfortable SKIN: skin color, texture, turgor are normal, no rashes or significant lesions EYES: normal, Conjunctiva are pink and non-injected, sclera clear OROPHARYNX:no exudate, no erythema and lips, buccal mucosa, and tongue normal  NECK: supple, thyroid normal size, non-tender, without nodularity LYMPH:  no palpable lymphadenopathy in the cervical, axillary or inguinal LUNGS: clear to auscultation and percussion with normal breathing effort HEART: regular rate & rhythm and no murmurs and no lower extremity edema ABDOMEN:abdomen soft, non-tender and normal bowel sounds Musculoskeletal:no cyanosis of digits and no clubbing  NEURO: alert & oriented x 3 with fluent speech, no focal motor/sensory deficits LABORATORY DATA:  I have reviewed the data as listed   Chemistry      Component Value Date/Time   NA 141 03/08/2015 1507   NA 140 03/04/2011 1223   K 3.7 03/08/2015 1507   K 5.0 03/04/2011 1223   CL 101 03/04/2011 1223   CO2 25 03/08/2015 1507   CO2 32 03/04/2011 1223   BUN 14.0 03/08/2015 1507   BUN 23 03/04/2011 1223   CREATININE 1.2* 03/08/2015 1507   CREATININE 1.24* 03/04/2011 1223      Component Value Date/Time   CALCIUM 10.9* 03/08/2015 1507   CALCIUM 11.4* 03/04/2011 1223   ALKPHOS 79 03/08/2015 1507   ALKPHOS 59 09/09/2010 1105   AST 20 03/08/2015 1507   AST 23 09/09/2010 1105   ALT 16 03/08/2015 1507   ALT 21 09/09/2010 1105   BILITOT 0.80 03/08/2015 1507   BILITOT 0.7 09/09/2010 1105       Lab Results  Component Value Date   WBC 8.1 03/13/2015   HGB 14.2 03/13/2015   HCT 42.7 03/13/2015   MCV 86.8 03/13/2015   PLT 340 03/13/2015   NEUTROABS 5.1 03/13/2015   ASSESSMENT & PLAN:  Breast cancer of upper-outer quadrant of left female breast Left  breast invasive ductal carcinoma grade 3 ER 0% PR 0% HER-2 negative Ki-67 75%, 2.9 cm mass with microcalcifications plus abnormal lymph nodes biopsy-proven to be breast cancer T2 N1 M0 stage IIB clinical stage  Treatment Plan:  1. Neoadjuvant chemotherapy: Dose dense Adriamycin and Cytoxan 4 followed by Abraxane weekly 12 2. Consideration for Alliance clinical trial for surgery 3. Followed by adjuvant radiation  Chronic kidney disease stage II: We elected to start at a lower dosage of chemotherapy. Current Treatment: Dose dense Adriamycin and Cytoxan Cycle 1 day 1 Port, ECHO, PET and Breast MRI pending.  Patient to start chemotherapy June 2. I would like to meet her on June 2 to discuss the PET CT scan and breast MRI findings.    No orders of the defined types were placed in this encounter.   The patient has a good understanding of the overall plan. she agrees with it. she will call with any problems that may develop before the next visit here.  Rulon Eisenmenger, MD

## 2015-03-13 NOTE — Progress Notes (Signed)
Echocardiogram 2D Echocardiogram has been performed.  Carla Bennett 03/13/2015, 12:05 PM

## 2015-03-14 DIAGNOSIS — E210 Primary hyperparathyroidism: Secondary | ICD-10-CM | POA: Diagnosis not present

## 2015-03-15 ENCOUNTER — Encounter (HOSPITAL_COMMUNITY)
Admission: RE | Admit: 2015-03-15 | Discharge: 2015-03-15 | Disposition: A | Discharge status: Home / Self Care | Payer: Medicare Other | Source: Ambulatory Visit | Attending: Hematology and Oncology | Admitting: Hematology and Oncology

## 2015-03-15 DIAGNOSIS — C50919 Malignant neoplasm of unspecified site of unspecified female breast: Secondary | ICD-10-CM | POA: Diagnosis not present

## 2015-03-15 DIAGNOSIS — C50412 Malignant neoplasm of upper-outer quadrant of left female breast: Secondary | ICD-10-CM

## 2015-03-15 LAB — GLUCOSE, CAPILLARY: GLUCOSE-CAPILLARY: 98 mg/dL (ref 65–99)

## 2015-03-15 MED ORDER — FLUDEOXYGLUCOSE F - 18 (FDG) INJECTION
7.5800 | Freq: Once | INTRAVENOUS | Status: AC | PRN
  Administered 2015-03-15: 7.58 via INTRAVENOUS

## 2015-03-16 ENCOUNTER — Ambulatory Visit (HOSPITAL_COMMUNITY)
Admission: RE | Admit: 2015-03-16 | Discharge: 2015-03-16 | Disposition: A | Discharge status: Home / Self Care | Payer: Medicare Other | Source: Ambulatory Visit | Attending: Hematology and Oncology | Admitting: Hematology and Oncology

## 2015-03-16 ENCOUNTER — Encounter (HOSPITAL_COMMUNITY)
Admission: RE | Admit: 2015-03-16 | Discharge: 2015-03-16 | Disposition: A | Discharge status: Home / Self Care | Payer: Medicare Other | Source: Ambulatory Visit | Attending: Surgery | Admitting: Surgery

## 2015-03-16 ENCOUNTER — Encounter (HOSPITAL_COMMUNITY): Payer: Self-pay

## 2015-03-16 DIAGNOSIS — C50412 Malignant neoplasm of upper-outer quadrant of left female breast: Secondary | ICD-10-CM | POA: Insufficient documentation

## 2015-03-16 DIAGNOSIS — N63 Unspecified lump in breast: Secondary | ICD-10-CM | POA: Diagnosis not present

## 2015-03-16 HISTORY — DX: Cardiac murmur, unspecified: R01.1

## 2015-03-16 HISTORY — DX: Disorder of parathyroid gland, unspecified: E21.5

## 2015-03-16 LAB — CBC WITH DIFFERENTIAL/PLATELET
Basophils Absolute: 0.1 10*3/uL (ref 0.0–0.1)
Basophils Relative: 1 % (ref 0–1)
EOS PCT: 3 % (ref 0–5)
Eosinophils Absolute: 0.3 10*3/uL (ref 0.0–0.7)
HCT: 44.7 % (ref 36.0–46.0)
HEMOGLOBIN: 14.8 g/dL (ref 12.0–15.0)
LYMPHS ABS: 2.2 10*3/uL (ref 0.7–4.0)
Lymphocytes Relative: 24 % (ref 12–46)
MCH: 28.6 pg (ref 26.0–34.0)
MCHC: 33.1 g/dL (ref 30.0–36.0)
MCV: 86.3 fL (ref 78.0–100.0)
MONO ABS: 0.6 10*3/uL (ref 0.1–1.0)
Monocytes Relative: 7 % (ref 3–12)
NEUTROS ABS: 6.1 10*3/uL (ref 1.7–7.7)
Neutrophils Relative %: 65 % (ref 43–77)
PLATELETS: 359 10*3/uL (ref 150–400)
RBC: 5.18 MIL/uL — AB (ref 3.87–5.11)
RDW: 13.1 % (ref 11.5–15.5)
WBC: 9.3 10*3/uL (ref 4.0–10.5)

## 2015-03-16 LAB — COMPREHENSIVE METABOLIC PANEL
ALT: 18 U/L (ref 14–54)
ANION GAP: 8 (ref 5–15)
AST: 22 U/L (ref 15–41)
Albumin: 4.2 g/dL (ref 3.5–5.0)
Alkaline Phosphatase: 69 U/L (ref 38–126)
BILIRUBIN TOTAL: 0.6 mg/dL (ref 0.3–1.2)
BUN: 13 mg/dL (ref 6–20)
CO2: 29 mmol/L (ref 22–32)
Calcium: 11.1 mg/dL — ABNORMAL HIGH (ref 8.9–10.3)
Chloride: 105 mmol/L (ref 101–111)
Creatinine, Ser: 1.25 mg/dL — ABNORMAL HIGH (ref 0.44–1.00)
GFR calc non Af Amer: 40 mL/min — ABNORMAL LOW (ref >60)
GFR, EST AFRICAN AMERICAN: 47 mL/min — AB (ref >60)
GLUCOSE: 128 mg/dL — AB (ref 65–99)
Potassium: 4 mmol/L (ref 3.5–5.1)
Sodium: 142 mmol/L (ref 135–145)
TOTAL PROTEIN: 7.3 g/dL (ref 6.5–8.1)

## 2015-03-16 MED ORDER — GADOBENATE DIMEGLUMINE 529 MG/ML IV SOLN
15.0000 mL | Freq: Once | INTRAVENOUS | Status: AC | PRN
  Administered 2015-03-16: 13 mL via INTRAVENOUS

## 2015-03-16 NOTE — Progress Notes (Signed)
Anesthesia Chart Review: Patient is a 80 year old female scheduled for Port-a-cath insertion on 03/20/15 by Dr. Brantley Stage.  DX: Poor venous access, left breast cancer. Patient is scheduled to start chemotherapy on 03/22/15.   History includes non-smoker, left breast cancer, diverticulosis, CKD stage 3 (Dr. 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 PCP is Dr. Trilby Drummer. HEM-ONC is Dr. Lindi Adie.   Meds include Lasix, labetalol, lisinopril, Claritin, Ativan. She has not yet started Decadron, Zofran, and Compazine.   03/13/15 Echo: Study Conclusions - Left ventricle: The cavity size was normal. Wall thickness was increased in a pattern of mild LVH. Systolic function was normal. The estimated ejection fraction was in the range of 55% to 60%. Wall motion was normal; there were no regional wall motion abnormalities. Doppler parameters are consistent with elevated ventricular end-diastolic filling pressure. - Aortic valve: There was mild regurgitation. - Mitral valve: There was mild regurgitation. - Left atrium: The atrium was mildly dilated. - Atrial septum: No defect or patent foramen ovale was identified. - Pulmonary arteries: PA peak pressure: 40 mm Hg (S). - Impressions: No strain analysis done  03/16/15 EKG: NSR, inferior infarct (age undetermined), possible anterior infarct (age undetermined).  Overall, I think her EKG is stable when compared to 09/09/10 tracing.  Preoperative labs noted. Cr 1.25.  I anticipate that she can proceed as planned.  George Hugh [facility] Short Stay Center/Anesthesiology Phone [phone] 03/16/2015 3:51 PM

## 2015-03-16 NOTE — Pre-Procedure Instructions (Addendum)
Carla Bennett  [id number]      [facility] - [place], [place] [address] [place] [address] Phone: [phone] Fax: [phone]  [facility] - [place], [employer] - [address] AT [place] [address] [place] [address] Phone: [phone] Fax: [phone]  [facility] [place] [place] [address] Phone: [phone] Fax: [phone]  [facility] [address] [place], [place] [address] [place] [address] Phone: [phone] Fax: [phone]  [id number] [place] [address] Phone: [phone] Fax: [phone]    Your procedure is scheduled on 03/20/15.  Report to [facility] short stay admitting at 700 A.M.  Call this number if you have problems the morning of surgery:  [phone]   Remember:  Do not eat food or drink liquids after midnight.  Take these medicines the morning of surgery with A SIP OF WATER , labetalol, zofran or compazine if needed    STOP all herbel meds, nsaids (aleve,naproxen,advil,ibuprofen) today including vitamins, aspirin, meloxicam   Do not wear jewelry, make-up or nail polish.  Do not wear lotions, powders, or perfumes.  You may wear deodorant.  Do not shave 48 hours prior to surgery.  Men may shave face and neck.  Do not bring valuables to the hospital.  [facility] is not responsible for any belongings or valuables.  Contacts, dentures or bridgework may not be worn into surgery.  Leave your suitcase in the car.  After surgery it may be brought to your room.  For patients admitted to the hospital, discharge time will be determined by your treatment team.  Patients discharged the day of surgery will not be allowed to drive home.    Name and phone number of your [facility]:    Special instructions:   Special Instructions: [facility] - Preparing for Surgery  Before surgery, you can play an important role.  Because skin is not sterile, your skin needs to be as free of germs as possible.  You can reduce the number of germs on you skin by washing with CHG (chlorahexidine gluconate) soap before surgery.  CHG is an antiseptic cleaner which kills germs and bonds with the skin to continue killing germs even after washing.  Please DO NOT use if you have an allergy to CHG or antibacterial soaps.  If your skin becomes reddened/irritated stop using the CHG and inform your nurse when you arrive at Short Stay.  Do not shave (including legs and underarms) for at least 48 hours prior to the first CHG shower.  You may shave your face.  Please follow these instructions carefully:   1.  Shower with CHG Soap the night before surgery and the morning of Surgery.  2.  If you choose to wash your hair, wash your hair first as usual with your normal shampoo.  3.  After you shampoo, rinse your hair and body thoroughly to remove the Shampoo.  4.  Use CHG as you would any other liquid soap.  You can apply chg directly  to the skin and wash gently with scrungie or a clean washcloth.  5.  Apply the CHG Soap to your body ONLY FROM THE NECK DOWN.  Do not use on  open wounds or open sores.  Avoid contact with your eyes ears, mouth and genitals (private parts).  Wash genitals (private parts)       with your normal soap.  6.  Wash thoroughly, paying special attention to the area where your surgery will be performed.  7.  Thoroughly rinse your body with warm water from the neck down.  8.  DO NOT shower/wash with your normal soap after using and rinsing off the CHG Soap.  9.  Pat yourself dry with a clean towel.            10.  Wear clean pajamas.            11.  Place clean sheets on your bed the night of your first shower and do not sleep with pets.  Day of  Surgery  Do not apply any lotions/deodorants the morning of surgery.  Please wear clean clothes to the hospital/surgery center.  Please read over the following fact sheets that you were given. Pain Booklet, Coughing and Deep Breathing and Surgical Site Infection Prevention

## 2015-03-16 NOTE — Progress Notes (Addendum)
req'd office notes from [facility]- dr Marval Regal last visit jan of 2016 next visit 6.29.16 req'd ekg, office notes, stress, cath if done from dr brian Noah Delaine pcp

## 2015-03-19 MED ORDER — CHLORHEXIDINE GLUCONATE 4 % EX LIQD: 1.0000 "application " | Freq: Once | CUTANEOUS | Status: DC

## 2015-03-19 MED ORDER — CEFAZOLIN SODIUM-DEXTROSE 2-3 GM-% IV SOLR
2.0000 g | INTRAVENOUS | Status: AC
  Administered 2015-03-20: 2 g via INTRAVENOUS
  Filled 2015-03-19: qty 50

## 2015-03-20 ENCOUNTER — Encounter (HOSPITAL_COMMUNITY): Payer: Self-pay | Admitting: *Deleted

## 2015-03-20 ENCOUNTER — Ambulatory Visit (HOSPITAL_COMMUNITY)
Admission: RE | Admit: 2015-03-20 | Discharge: 2015-03-20 | Disposition: A | Discharge status: Home / Self Care | Payer: Medicare Other | Source: Ambulatory Visit | Attending: Surgery | Admitting: Surgery

## 2015-03-20 ENCOUNTER — Ambulatory Visit (HOSPITAL_COMMUNITY): Payer: Medicare Other

## 2015-03-20 ENCOUNTER — Encounter (HOSPITAL_COMMUNITY)
Admission: RE | Disposition: A | Discharge status: Home / Self Care | Payer: Self-pay | Source: Ambulatory Visit | Attending: Surgery

## 2015-03-20 ENCOUNTER — Ambulatory Visit (HOSPITAL_COMMUNITY): Payer: Medicare Other | Admitting: Vascular Surgery

## 2015-03-20 ENCOUNTER — Ambulatory Visit (HOSPITAL_COMMUNITY): Payer: Medicare Other | Admitting: Anesthesiology

## 2015-03-20 DIAGNOSIS — M1990 Unspecified osteoarthritis, unspecified site: Secondary | ICD-10-CM | POA: Diagnosis not present

## 2015-03-20 DIAGNOSIS — Z86010 Personal history of colonic polyps: Secondary | ICD-10-CM | POA: Insufficient documentation

## 2015-03-20 DIAGNOSIS — Z9049 Acquired absence of other specified parts of digestive tract: Secondary | ICD-10-CM | POA: Diagnosis not present

## 2015-03-20 DIAGNOSIS — C50919 Malignant neoplasm of unspecified site of unspecified female breast: Secondary | ICD-10-CM

## 2015-03-20 DIAGNOSIS — Z79899 Other long term (current) drug therapy: Secondary | ICD-10-CM | POA: Diagnosis not present

## 2015-03-20 DIAGNOSIS — Z90710 Acquired absence of both cervix and uterus: Secondary | ICD-10-CM | POA: Insufficient documentation

## 2015-03-20 DIAGNOSIS — I10 Essential (primary) hypertension: Secondary | ICD-10-CM | POA: Diagnosis not present

## 2015-03-20 DIAGNOSIS — C50912 Malignant neoplasm of unspecified site of left female breast: Secondary | ICD-10-CM | POA: Diagnosis not present

## 2015-03-20 DIAGNOSIS — E780 Pure hypercholesterolemia: Secondary | ICD-10-CM | POA: Insufficient documentation

## 2015-03-20 DIAGNOSIS — C50412 Malignant neoplasm of upper-outer quadrant of left female breast: Secondary | ICD-10-CM | POA: Diagnosis not present

## 2015-03-20 DIAGNOSIS — K5730 Diverticulosis of large intestine without perforation or abscess without bleeding: Secondary | ICD-10-CM | POA: Diagnosis not present

## 2015-03-20 DIAGNOSIS — I878 Other specified disorders of veins: Secondary | ICD-10-CM

## 2015-03-20 DIAGNOSIS — M109 Gout, unspecified: Secondary | ICD-10-CM | POA: Insufficient documentation

## 2015-03-20 DIAGNOSIS — J9811 Atelectasis: Secondary | ICD-10-CM | POA: Diagnosis not present

## 2015-03-20 HISTORY — PX: PORTACATH PLACEMENT: SHX2246

## 2015-03-20 SURGERY — INSERTION, TUNNELED CENTRAL VENOUS DEVICE, WITH PORT
Anesthesia: General | Site: Chest | Laterality: Right

## 2015-03-20 MED ORDER — MIDAZOLAM HCL 2 MG/2ML IJ SOLN
INTRAMUSCULAR | Status: AC
  Filled 2015-03-20: qty 2

## 2015-03-20 MED ORDER — FENTANYL CITRATE (PF) 100 MCG/2ML IJ SOLN
INTRAMUSCULAR | Status: DC | PRN
  Administered 2015-03-20: 50 ug via INTRAVENOUS

## 2015-03-20 MED ORDER — 0.9 % SODIUM CHLORIDE (POUR BTL) OPTIME
TOPICAL | Status: DC | PRN
  Administered 2015-03-20: 1000 mL

## 2015-03-20 MED ORDER — ONDANSETRON HCL 4 MG/2ML IJ SOLN
INTRAMUSCULAR | Status: AC
  Filled 2015-03-20: qty 2

## 2015-03-20 MED ORDER — PROPOFOL 10 MG/ML IV BOLUS
INTRAVENOUS | Status: DC | PRN
  Administered 2015-03-20: 150 mg via INTRAVENOUS

## 2015-03-20 MED ORDER — BUPIVACAINE-EPINEPHRINE (PF) 0.25% -1:200000 IJ SOLN
INTRAMUSCULAR | Status: AC
  Filled 2015-03-20: qty 30

## 2015-03-20 MED ORDER — SODIUM CHLORIDE 0.9 % IV SOLN
INTRAVENOUS | Status: DC
  Administered 2015-03-20 (×2): via INTRAVENOUS

## 2015-03-20 MED ORDER — LACTATED RINGERS IV SOLN: INTRAVENOUS | Status: DC | PRN

## 2015-03-20 MED ORDER — OXYCODONE-ACETAMINOPHEN 5-325 MG PO TABS
ORAL_TABLET | ORAL | Status: AC
  Filled 2015-03-20: qty 1

## 2015-03-20 MED ORDER — OXYCODONE-ACETAMINOPHEN 5-325 MG PO TABS
1.0000 | ORAL_TABLET | ORAL | Status: DC | PRN
  Administered 2015-03-20: 1 via ORAL

## 2015-03-20 MED ORDER — HYDROMORPHONE HCL 1 MG/ML IJ SOLN: 0.2500 mg | INTRAMUSCULAR | Status: DC | PRN

## 2015-03-20 MED ORDER — OXYCODONE-ACETAMINOPHEN 5-325 MG PO TABS: 1.0000 | ORAL_TABLET | ORAL | Status: DC | PRN

## 2015-03-20 MED ORDER — PHENYLEPHRINE 40 MCG/ML (10ML) SYRINGE FOR IV PUSH (FOR BLOOD PRESSURE SUPPORT)
PREFILLED_SYRINGE | INTRAVENOUS | Status: AC
  Filled 2015-03-20: qty 10

## 2015-03-20 MED ORDER — HEPARIN SOD (PORK) LOCK FLUSH 100 UNIT/ML IV SOLN
INTRAVENOUS | Status: DC | PRN
  Administered 2015-03-20: 400 [IU] via INTRAVENOUS

## 2015-03-20 MED ORDER — MIDAZOLAM HCL 5 MG/5ML IJ SOLN
INTRAMUSCULAR | Status: DC | PRN
  Administered 2015-03-20: 2 mg via INTRAVENOUS

## 2015-03-20 MED ORDER — PHENYLEPHRINE HCL 10 MG/ML IJ SOLN
INTRAMUSCULAR | Status: DC | PRN
  Administered 2015-03-20: 120 ug via INTRAVENOUS
  Administered 2015-03-20: 80 ug via INTRAVENOUS

## 2015-03-20 MED ORDER — EPHEDRINE SULFATE 50 MG/ML IJ SOLN
INTRAMUSCULAR | Status: DC | PRN
  Administered 2015-03-20: 10 mg via INTRAVENOUS

## 2015-03-20 MED ORDER — FENTANYL CITRATE (PF) 250 MCG/5ML IJ SOLN
INTRAMUSCULAR | Status: AC
  Filled 2015-03-20: qty 5

## 2015-03-20 MED ORDER — LIDOCAINE HCL (CARDIAC) 20 MG/ML IV SOLN
INTRAVENOUS | Status: AC
  Filled 2015-03-20: qty 5

## 2015-03-20 MED ORDER — SODIUM CHLORIDE 0.9 % IJ SOLN
INTRAMUSCULAR | Status: AC
  Filled 2015-03-20: qty 10

## 2015-03-20 MED ORDER — ONDANSETRON HCL 4 MG/2ML IJ SOLN
4.0000 mg | Freq: Once | INTRAMUSCULAR | Status: AC
  Administered 2015-03-20: 4 mg via INTRAVENOUS

## 2015-03-20 MED ORDER — PHENYLEPHRINE HCL 10 MG/ML IJ SOLN
10.0000 mg | INTRAMUSCULAR | Status: DC | PRN
  Administered 2015-03-20: 25 ug/min via INTRAVENOUS

## 2015-03-20 MED ORDER — ONDANSETRON HCL 4 MG/2ML IJ SOLN
INTRAMUSCULAR | Status: DC | PRN
  Administered 2015-03-20: 4 mg via INTRAVENOUS

## 2015-03-20 MED ORDER — BUPIVACAINE-EPINEPHRINE 0.25% -1:200000 IJ SOLN
INTRAMUSCULAR | Status: DC | PRN
  Administered 2015-03-20: 9 mL

## 2015-03-20 MED ORDER — SODIUM CHLORIDE 0.9 % IR SOLN
Status: DC | PRN
  Administered 2015-03-20: 10:00:00

## 2015-03-20 MED ORDER — LIDOCAINE HCL (CARDIAC) 20 MG/ML IV SOLN
INTRAVENOUS | Status: DC | PRN
  Administered 2015-03-20: 100 mg via INTRAVENOUS

## 2015-03-20 MED ORDER — HEPARIN SOD (PORK) LOCK FLUSH 100 UNIT/ML IV SOLN
INTRAVENOUS | Status: AC
  Filled 2015-03-20: qty 5

## 2015-03-20 MED ORDER — EPHEDRINE SULFATE 50 MG/ML IJ SOLN
INTRAMUSCULAR | Status: AC
  Filled 2015-03-20: qty 1

## 2015-03-20 MED ORDER — PROPOFOL 10 MG/ML IV BOLUS
INTRAVENOUS | Status: AC
  Filled 2015-03-20: qty 20

## 2015-03-20 MED ORDER — PROMETHAZINE HCL 25 MG/ML IJ SOLN: 6.2500 mg | INTRAMUSCULAR | Status: DC | PRN

## 2015-03-20 SURGICAL SUPPLY — 46 items
BAG DECANTER FOR FLEXI CONT (MISCELLANEOUS) ×3 IMPLANT
BLADE SURG 11 STRL SS (BLADE) ×3 IMPLANT
CHLORAPREP W/TINT 26ML (MISCELLANEOUS) ×3 IMPLANT
COVER MAYO STAND STRL (DRAPES) ×3 IMPLANT
COVER SURGICAL LIGHT HANDLE (MISCELLANEOUS) ×3 IMPLANT
COVER TRANSDUCER ULTRASND GEL (DRAPE) ×2 IMPLANT
CRADLE DONUT ADULT HEAD (MISCELLANEOUS) ×3 IMPLANT
DRAPE C-ARM 42X72 X-RAY (DRAPES) ×3 IMPLANT
DRAPE LAPAROSCOPIC ABDOMINAL (DRAPES) ×3 IMPLANT
DRAPE UTILITY XL STRL (DRAPES) ×4 IMPLANT
ELECT CAUTERY BLADE 6.4 (BLADE) ×3 IMPLANT
ELECT REM PT RETURN 9FT ADLT (ELECTROSURGICAL) ×3
ELECTRODE REM PT RTRN 9FT ADLT (ELECTROSURGICAL) ×1 IMPLANT
GAUZE SPONGE 4X4 16PLY XRAY LF (GAUZE/BANDAGES/DRESSINGS) ×3 IMPLANT
GEL ULTRASOUND 20GR AQUASONIC (MISCELLANEOUS) IMPLANT
GLOVE BIO SURGEON STRL SZ8 (GLOVE) ×3 IMPLANT
GLOVE BIOGEL PI IND STRL 8 (GLOVE) ×1 IMPLANT
GLOVE BIOGEL PI INDICATOR 8 (GLOVE) ×2
GOWN STRL REUS W/ TWL LRG LVL3 (GOWN DISPOSABLE) ×2 IMPLANT
GOWN STRL REUS W/ TWL XL LVL3 (GOWN DISPOSABLE) ×1 IMPLANT
GOWN STRL REUS W/TWL LRG LVL3 (GOWN DISPOSABLE) ×6
GOWN STRL REUS W/TWL XL LVL3 (GOWN DISPOSABLE) ×3
INTRODUCER COOK 11FR (CATHETERS) IMPLANT
KIT BASIN OR (CUSTOM PROCEDURE TRAY) ×3 IMPLANT
KIT PORT POWER 8FR ISP CVUE (Catheter) ×2 IMPLANT
KIT PORT POWER 9.6FR MRI PREA (Catheter) IMPLANT
KIT ROOM TURNOVER OR (KITS) ×3 IMPLANT
LIQUID BAND (GAUZE/BANDAGES/DRESSINGS) ×3 IMPLANT
NDL HYPO 25GX1X1/2 BEV (NEEDLE) ×1 IMPLANT
NEEDLE HYPO 25GX1X1/2 BEV (NEEDLE) ×3 IMPLANT
NS IRRIG 1000ML POUR BTL (IV SOLUTION) ×3 IMPLANT
PACK SURGICAL SETUP 50X90 (CUSTOM PROCEDURE TRAY) ×3 IMPLANT
PAD ARMBOARD 7.5X6 YLW CONV (MISCELLANEOUS) ×6 IMPLANT
PENCIL BUTTON HOLSTER BLD 10FT (ELECTRODE) ×3 IMPLANT
SET INTRODUCER 12FR PACEMAKER (SHEATH) IMPLANT
SET SHEATH INTRODUCER 10FR (MISCELLANEOUS) IMPLANT
SHEATH COOK PEEL AWAY SET 9F (SHEATH) IMPLANT
SUT MNCRL AB 4-0 PS2 18 (SUTURE) ×3 IMPLANT
SUT PROLENE 2 0 SH 30 (SUTURE) ×6 IMPLANT
SUT VIC AB 3-0 SH 27 (SUTURE) ×3
SUT VIC AB 3-0 SH 27X BRD (SUTURE) ×1 IMPLANT
SYR 20ML ECCENTRIC (SYRINGE) ×6 IMPLANT
SYR 5ML LUER SLIP (SYRINGE) ×3 IMPLANT
SYR CONTROL 10ML LL (SYRINGE) ×3 IMPLANT
TOWEL OR 17X24 6PK STRL BLUE (TOWEL DISPOSABLE) ×1 IMPLANT
TOWEL OR 17X26 10 PK STRL BLUE (TOWEL DISPOSABLE) ×3 IMPLANT

## 2015-03-20 NOTE — Anesthesia Preprocedure Evaluation (Addendum)
Anesthesia Evaluation  Patient identified by MRN, date of birth, ID band Patient awake    Reviewed: Allergy & Precautions, NPO status , Patient's Chart, lab work & pertinent test results  History of Anesthesia Complications Negative for: history of anesthetic complications  Airway Mallampati: II  TM Distance: >3 FB     Dental  (+) Teeth Intact, Dental Advisory Given   Pulmonary neg pulmonary ROS,    Pulmonary exam normal       Cardiovascular hypertension, Pt. on medications and Pt. on home beta blockers Normal cardiovascular exam    Neuro/Psych negative neurological ROS  negative psych ROS   GI/Hepatic negative GI ROS, Neg liver ROS,   Endo/Other  negative endocrine ROS  Renal/GU Renal InsufficiencyRenal disease     Musculoskeletal   Abdominal   Peds  Hematology   Anesthesia Other Findings   Reproductive/Obstetrics                           Anesthesia Physical Anesthesia Plan  ASA: III  Anesthesia Plan: General   Post-op Pain Management:    Induction: Intravenous  Airway Management Planned: LMA  Additional Equipment:   Intra-op Plan:   Post-operative Plan: Extubation in OR  Informed Consent: I have reviewed the patients History and Physical, chart, labs and discussed the procedure including the risks, benefits and alternatives for the proposed anesthesia with the patient or authorized representative who has indicated his/her understanding and acceptance.   Dental advisory given and Dental Advisory Given  Plan Discussed with: CRNA, Anesthesiologist and Surgeon  Anesthesia Plan Comments:        Anesthesia Quick Evaluation

## 2015-03-20 NOTE — Anesthesia Postprocedure Evaluation (Signed)
Anesthesia Post Note  Patient: Carla Bennett  Procedure(s) Performed: Procedure(s) (LRB): INSERTION PORT-A-CATH (Right)  Anesthesia type: general  Patient location: PACU  Post pain: Pain level controlled  Post assessment: Patient's Cardiovascular Status Stable  Last Vitals:  Filed Vitals:   03/20/15 1100  BP: 144/65  Pulse: 61  Temp:   Resp:     Post vital signs: Reviewed and stable  Level of consciousness: sedated  Complications: No apparent anesthesia complications

## 2015-03-20 NOTE — Interval H&P Note (Signed)
History and Physical Interval Note:  03/20/2015 8:23 AM  [facility]  has presented today for surgery, with the diagnosis of Left Breast Cancer  The various methods of treatment have been discussed with the patient and family. After consideration of risks, benefits and other options for treatment, the patient has consented to  Procedure(s): INSERTION PORT-A-CATH (N/A) as a surgical intervention .  The patient's history has been reviewed, patient examined, no change in status, stable for surgery.  I have reviewed the patient's chart and labs.  Questions were answered to the patient's satisfaction.     Dameer Speiser A.

## 2015-03-20 NOTE — H&P (View-Only) (Signed)
Carla Bennett AM Location: [facility] Patient #: [id number] DOB: 01-24-1935 Undefined / Language: [name] / Race: Undefined Female History of Present Illness [name] A. Maille Halliwell MD; 03/07/2015 3:40 PM) Patient words: pt sent at the request of Dr [name] for left breast cancer diagnosed by mammogram, U/S AND core biopsy. Pt denies any abnormality with her breasts. No pain or nipple discharge noted bilaterally. Mammogram shows a 2.9 cm triple negative IDC left UOQ.  The patient is a 80 year old female   Other Problems [name] [place], [place]; 03/07/2015 7:57 AM) Arthritis Back Pain Bladder Problems Breast Cancer Cholelithiasis Diverticulosis Heart murmur High blood pressure Hypercholesterolemia Lump In Breast Transfusion history  Past Surgical History [name], RMA; 03/07/2015 7:57 AM) Breast Biopsy Bilateral. Colon Polyp Removal - Colonoscopy Gallbladder Surgery - Laparoscopic Hysterectomy (not due to cancer) - Partial Oral Surgery Sentinel Lymph Node Biopsy Tonsillectomy  Diagnostic Studies History [name] [place], RMA; 03/07/2015 7:57 AM) Colonoscopy 1-5 years ago Mammogram within last year Pap Smear >5 years ago  Social History [name] [place], RMA; 03/07/2015 7:57 AM) Alcohol use Occasional alcohol use. Caffeine use Coffee, Tea. No drug use Tobacco use Never smoker.  Family History [name] [place], [place]; 03/07/2015 7:57 AM) Breast Cancer Sister. Cancer Family Members In General. Prostate Cancer Family Members In General. Respiratory Condition Father.  Pregnancy / Birth History [name] [place], [place]; 03/07/2015 7:57 AM) Age at menarche 54 years. Age of menopause 57-55 Gravida 6 Irregular periods Maternal age 30-25 Para 4     Review of Systems [name] [place] RMA; 03/07/2015 7:57 AM) General Present- Fatigue and Night Sweats. Not Present- Appetite Loss, Chills, Fever, Weight Gain and Weight  Loss. Skin Present- Dryness. Not Present- Change in Wart/Mole, Hives, Jaundice, New Lesions, Non-Healing Wounds, Rash and Ulcer. HEENT Present- Hoarseness, Nose Bleed, Ringing in the Ears, Seasonal Allergies, Sore Throat and Wears glasses/contact lenses. Not Present- Earache, Hearing Loss, Oral Ulcers, Sinus Pain, Visual Disturbances and Yellow Eyes. Respiratory Present- Snoring. Not Present- Bloody sputum, Chronic Cough, Difficulty Breathing and Wheezing. Breast Present- Breast Mass. Not Present- Breast Pain, Nipple Discharge and Skin Changes. Cardiovascular Present- Leg Cramps and Palpitations. Not Present- Chest Pain, Difficulty Breathing Lying Down, Rapid Heart Rate, Shortness of Breath and Swelling of Extremities. Gastrointestinal Present- Constipation and Gets full quickly at meals. Not Present- Abdominal Pain, Bloating, Bloody Stool, Change in Bowel Habits, Chronic diarrhea, Difficulty Swallowing, Excessive gas, Hemorrhoids, Indigestion, Nausea, Rectal Pain and Vomiting. Female Genitourinary Not Present- Frequency, Nocturia, Painful Urination, Pelvic Pain and Urgency. Musculoskeletal Present- Back Pain, Joint Pain, Joint Stiffness, Muscle Pain, Muscle Weakness and Swelling of Extremities. Neurological Not Present- Decreased Memory, Fainting, Headaches, Numbness, Seizures, Tingling, Tremor, Trouble walking and Weakness. Psychiatric Present- Anxiety. Not Present- Bipolar, Change in Sleep Pattern, Depression, Fearful and Frequent crying. Endocrine Present- Cold Intolerance and Hot flashes. Not Present- Excessive Hunger, Hair Changes, Heat Intolerance and New Diabetes. Hematology Not Present- Easy Bruising, Excessive bleeding, Gland problems, HIV and Persistent Infections.   Physical Exam (Larna Capelle A. Niala Stcharles MD; 03/07/2015 3:39 PM)  General Mental Status-Alert. General Appearance-Consistent with stated age. Hydration-Well hydrated. Voice-Normal.  Head and Neck Head-normocephalic,  atraumatic with no lesions or palpable masses. Trachea-midline. Thyroid Gland Characteristics - normal size and consistency.  Eye Eyeball - Bilateral-Extraocular movements intact. Sclera/Conjunctiva - Bilateral-No scleral icterus.  Chest and Lung Exam Chest and lung exam reveals -quiet, even and easy respiratory effort with no use of accessory muscles and on auscultation, normal breath sounds, no adventitious sounds and normal vocal resonance. Inspection  Chest Wall - Normal. Back - normal.  Breast Breast - Left-Symmetric, Non Tender, No Biopsy scars, no Dimpling, No Inflammation, No Lumpectomy scars, No Mastectomy scars, No Peau d' Orange. Breast - Right-Symmetric, Non Tender, No Biopsy scars, no Dimpling, No Inflammation, No Lumpectomy scars, No Mastectomy scars, No Peau d' Orange. Breast Lump-No Palpable Breast Mass. Note: left breast bruising noted   Cardiovascular Cardiovascular examination reveals -normal heart sounds, regular rate and rhythm with no murmurs and normal pedal pulses bilaterally.  Abdomen Inspection Inspection of the abdomen reveals - No Hernias. Skin - Scar - no surgical scars. Palpation/Percussion Palpation and Percussion of the abdomen reveal - Soft, Non Tender, No Rebound tenderness, No Rigidity (guarding) and No hepatosplenomegaly. Auscultation Auscultation of the abdomen reveals - Bowel sounds normal.  Neurologic Neurologic evaluation reveals -alert and oriented x 3 with no impairment of recent or remote memory. Mental Status-Normal.  Musculoskeletal Normal Exam - Left-Upper Extremity Strength Normal and Lower Extremity Strength Normal. Normal Exam - Right-Upper Extremity Strength Normal and Lower Extremity Strength Normal.  Lymphatic Head & Neck  General Head & Neck Lymphatics: Bilateral - Description - Normal. Axillary  General Axillary Region: Left - Size - 2.0 cm. Consistency - Firm. Mobility - Mobile. Bilateral -  Tenderness - Non Tender. Femoral & Inguinal  Generalized Femoral & Inguinal Lymphatics: Bilateral - Description - Normal. Tenderness - Non Tender.    Assessment & Plan (Leitha Hyppolite A. Aubrii Sharpless MD; 03/07/2015 3:41 PM)  BREAST CANCER, STAGE 2, LEFT (174.9  C50.912) Impression: this is a triple negative breast cancer with lymph node disease. Seen oncology and discussed role of neoadjuvant chemotherapy and she is agreeable with this. Discussed port placement for chemotherapy as well. Risk of bleeding, infection, collapse lung, organ injury, migration, malfunction and the need for other operative procedures. She would like to pursue breast conservation and may want to participate in [place] for lymph node management.

## 2015-03-20 NOTE — Transfer of Care (Signed)
Immediate Anesthesia Transfer of Care Note  Patient: Carla Bennett  Procedure(s) Performed: Procedure(s): INSERTION PORT-A-CATH (Right)  Patient Location: PACU  Anesthesia Type:General  Level of Consciousness: awake, alert  and oriented  Airway & Oxygen Therapy: Patient Spontanous Breathing  Post-op Assessment: Report given to RN, Post -op Vital signs reviewed and stable and Patient moving all extremities X 4  Post vital signs: Reviewed and stable  Last Vitals:  Filed Vitals:   03/20/15 0737  BP: 170/71  Pulse: 65  Temp: 36.1 C  Resp: 20    Complications: No apparent anesthesia complications

## 2015-03-20 NOTE — Op Note (Signed)
Preoperative diagnosis: PAC needed  Postoperative diagnosis: Same  Procedure: Portacath Placement  Surgeon: Turner Daniels, MD, FACS  Anesthesia: General and 0.25 % marcaine with epinephrine  Clinical History and Indications: The patient is getting ready to begin chemotherapy for her  Breast cancer. She  needs a Port-A-Cath for venous access.The procedure has been discussed with the patient.  Alternative therapies have been discussed with the patient.  Operative risks include bleeding, collapse lung,    Infection,  Organ injury,  Nerve injury,  Blood vessel injury,  DVT,  Pulmonary embolism,  Death,  And possible reoperation.  Medical management risks include worsening of present situation.  The success of the procedure is 50 -90 % at treating patients symptoms.  The patient understands and agrees to proceed.  Description of Procedure: I have seen the patient in the holding area and confirmed the plans for the procedure as noted above. I reviewed the risks and complications again and the patient has no further questions. She wishes to proceed.   The patient was then taken to the operating room. After satisfactory general  anesthesia had been obtained the upper chest and lower neck were prepped and draped as a sterile field. The timeout was done. Pt in head down position.   The right jugular  vein was entered with ultrasound guidance  and the guidewire threaded into the superior vena cava right atrial area under fluoroscopic guidance. An incision was then made on the anterior chest wall and a subcutaneous pocket fashioned for the port reservoir.  The port tubing was then brought through a subcutaneous tunnel from the port site to the guidewire site. The dilator and peel-away sheath were then advanced over the guidewire while monitoring this with fluoroscopy. The guidewire and dilator were removed and the tubing threaded to approximately 21 cm. The peel-away sheath was then removed. The catheter  aspirated and flushed easily. Using fluoroscopy the tip was backed out into the superior vena cava right atrial junction area. It aspirated and flushed easily. The reservoir was attached and the locking mechanism engaged. That aspirated and flushed easily.  The reservoir was secured to the fascia with 1 sutures of 2-0 Prolene. A final check with fluoroscopy was done to make sure we had no kinks and good positioning of the tip of the catheter. Everything appeared to be okay. The catheter was aspirated, flushed with dilute heparin and then concentrated aqueous heparin.  The incision was then closed with interrupted 3-0 Vicryl, and 4-0 Monocryl subcuticular with Dermabond on the skin.  There were no operative complications. Estimated blood loss was minimal. All counts were correct. The patient tolerated the procedure well.  Turner Daniels, MD, FACS

## 2015-03-20 NOTE — Discharge Instructions (Signed)

## 2015-03-20 NOTE — Anesthesia Procedure Notes (Signed)
Procedure Name: LMA Insertion Date/Time: 03/20/2015 9:32 AM Performed by: Neldon Newport Pre-anesthesia Checklist: Patient being monitored, Suction available, Emergency Drugs available, Patient identified and Timeout performed Patient Re-evaluated:Patient Re-evaluated prior to inductionOxygen Delivery Method: Circle system utilized Preoxygenation: Pre-oxygenation with 100% oxygen Intubation Type: IV induction Ventilation: Mask ventilation without difficulty LMA: LMA inserted LMA Size: 4.0 Placement Confirmation: positive ETCO2 and breath sounds checked- equal and bilateral Tube secured with: Tape Dental Injury: Teeth and Oropharynx as per pre-operative assessment

## 2015-03-21 ENCOUNTER — Encounter (HOSPITAL_COMMUNITY): Payer: Self-pay | Admitting: Surgery

## 2015-03-21 NOTE — Assessment & Plan Note (Signed)
Left breast invasive ductal carcinoma grade 3 ER 0% PR 0% HER-2 negative Ki-67 75%, 2.9 cm mass with microcalcifications plus abnormal lymph nodes biopsy-proven to be breast cancer T2 N1 M0 stage IIB clinical stage PET-CT: Neg Breast MRI: 3.1 cm left breast mass, 1.6 cm Left Axillary LN  Treatment Plan:  1. Neoadjuvant chemotherapy: Dose dense Adriamycin and Cytoxan 4 followed by Abraxane weekly 12 2. Consideration for Alliance clinical trial for surgery 3. Followed by adjuvant radiation  Current Treatment: Dose Dense AC X 4 foll by Abraxane X 12; Today is cycle 1 day 1  RTC 1 week for tox check

## 2015-03-22 ENCOUNTER — Other Ambulatory Visit (HOSPITAL_BASED_OUTPATIENT_CLINIC_OR_DEPARTMENT_OTHER): Payer: Medicare Other

## 2015-03-22 ENCOUNTER — Encounter: Payer: Self-pay | Admitting: *Deleted

## 2015-03-22 ENCOUNTER — Encounter: Payer: Self-pay | Admitting: General Practice

## 2015-03-22 ENCOUNTER — Ambulatory Visit (HOSPITAL_BASED_OUTPATIENT_CLINIC_OR_DEPARTMENT_OTHER): Payer: Medicare Other

## 2015-03-22 ENCOUNTER — Ambulatory Visit (HOSPITAL_BASED_OUTPATIENT_CLINIC_OR_DEPARTMENT_OTHER): Payer: Medicare Other | Admitting: Hematology and Oncology

## 2015-03-22 VITALS — BP 150/71 | HR 75 | Temp 97.4°F | Resp 17 | Ht 61.0 in | Wt 152.8 lb

## 2015-03-22 DIAGNOSIS — Z5189 Encounter for other specified aftercare: Secondary | ICD-10-CM

## 2015-03-22 DIAGNOSIS — C773 Secondary and unspecified malignant neoplasm of axilla and upper limb lymph nodes: Secondary | ICD-10-CM | POA: Diagnosis not present

## 2015-03-22 DIAGNOSIS — C50412 Malignant neoplasm of upper-outer quadrant of left female breast: Secondary | ICD-10-CM | POA: Diagnosis not present

## 2015-03-22 DIAGNOSIS — N182 Chronic kidney disease, stage 2 (mild): Secondary | ICD-10-CM

## 2015-03-22 DIAGNOSIS — Z5111 Encounter for antineoplastic chemotherapy: Secondary | ICD-10-CM

## 2015-03-22 DIAGNOSIS — Z171 Estrogen receptor negative status [ER-]: Secondary | ICD-10-CM

## 2015-03-22 LAB — COMPREHENSIVE METABOLIC PANEL (CC13)
ALBUMIN: 4 g/dL (ref 3.5–5.0)
ALT: 12 U/L (ref 0–55)
ANION GAP: 9 meq/L (ref 3–11)
AST: 21 U/L (ref 5–34)
Alkaline Phosphatase: 75 U/L (ref 40–150)
BUN: 15.1 mg/dL (ref 7.0–26.0)
CO2: 28 mEq/L (ref 22–29)
Calcium: 10.9 mg/dL — ABNORMAL HIGH (ref 8.4–10.4)
Chloride: 105 mEq/L (ref 98–109)
Creatinine: 1.2 mg/dL — ABNORMAL HIGH (ref 0.6–1.1)
EGFR: 43 mL/min/{1.73_m2} — ABNORMAL LOW (ref >90)
Glucose: 135 mg/dl (ref 70–140)
Potassium: 4.4 mEq/L (ref 3.5–5.1)
Sodium: 141 mEq/L (ref 136–145)
TOTAL PROTEIN: 7 g/dL (ref 6.4–8.3)
Total Bilirubin: 0.85 mg/dL (ref 0.20–1.20)

## 2015-03-22 LAB — CBC WITH DIFFERENTIAL/PLATELET
BASO%: 0.4 % (ref 0.0–2.0)
Basophils Absolute: 0 10*3/uL (ref 0.0–0.1)
EOS%: 1.9 % (ref 0.0–7.0)
Eosinophils Absolute: 0.2 10*3/uL (ref 0.0–0.5)
HEMATOCRIT: 42.6 % (ref 34.8–46.6)
HEMOGLOBIN: 14.3 g/dL (ref 11.6–15.9)
LYMPH#: 1.8 10*3/uL (ref 0.9–3.3)
LYMPH%: 17.6 % (ref 14.0–49.7)
MCH: 29.1 pg (ref 25.1–34.0)
MCHC: 33.6 g/dL (ref 31.5–36.0)
MCV: 86.6 fL (ref 79.5–101.0)
MONO#: 0.8 10*3/uL (ref 0.1–0.9)
MONO%: 7.5 % (ref 0.0–14.0)
NEUT%: 72.6 % (ref 38.4–76.8)
NEUTROS ABS: 7.5 10*3/uL — AB (ref 1.5–6.5)
Platelets: 303 10*3/uL (ref 145–400)
RBC: 4.92 10*6/uL (ref 3.70–5.45)
RDW: 13.1 % (ref 11.2–14.5)
WBC: 10.3 10*3/uL (ref 3.9–10.3)

## 2015-03-22 MED ORDER — PALONOSETRON HCL INJECTION 0.25 MG/5ML
INTRAVENOUS | Status: AC
  Filled 2015-03-22: qty 5

## 2015-03-22 MED ORDER — SODIUM CHLORIDE 0.9 % IV SOLN
Freq: Once | INTRAVENOUS | Status: AC
  Administered 2015-03-22: 14:00:00 via INTRAVENOUS

## 2015-03-22 MED ORDER — HEPARIN SOD (PORK) LOCK FLUSH 100 UNIT/ML IV SOLN
500.0000 [IU] | Freq: Once | INTRAVENOUS | Status: AC | PRN
  Administered 2015-03-22: 500 [IU]
  Filled 2015-03-22: qty 5

## 2015-03-22 MED ORDER — PALONOSETRON HCL INJECTION 0.25 MG/5ML
0.2500 mg | Freq: Once | INTRAVENOUS | Status: AC
  Administered 2015-03-22: 0.25 mg via INTRAVENOUS

## 2015-03-22 MED ORDER — PEGFILGRASTIM 6 MG/0.6ML ~~LOC~~ PSKT
6.0000 mg | PREFILLED_SYRINGE | Freq: Once | SUBCUTANEOUS | Status: AC
  Administered 2015-03-22: 6 mg via SUBCUTANEOUS
  Filled 2015-03-22: qty 0.6

## 2015-03-22 MED ORDER — SODIUM CHLORIDE 0.9 % IV SOLN
500.0000 mg/m2 | Freq: Once | INTRAVENOUS | Status: AC
  Administered 2015-03-22: 860 mg via INTRAVENOUS
  Filled 2015-03-22: qty 43

## 2015-03-22 MED ORDER — SODIUM CHLORIDE 0.9 % IJ SOLN
10.0000 mL | INTRAMUSCULAR | Status: DC | PRN
Start: 2015-03-22 — End: 2015-03-22
  Administered 2015-03-22: 10 mL
  Filled 2015-03-22: qty 10

## 2015-03-22 MED ORDER — SODIUM CHLORIDE 0.9 % IV SOLN
Freq: Once | INTRAVENOUS | Status: AC
  Administered 2015-03-22: 15:00:00 via INTRAVENOUS
  Filled 2015-03-22: qty 5

## 2015-03-22 MED ORDER — DOXORUBICIN HCL CHEMO IV INJECTION 2 MG/ML
50.0000 mg/m2 | Freq: Once | INTRAVENOUS | Status: AC
  Administered 2015-03-22: 86 mg via INTRAVENOUS
  Filled 2015-03-22: qty 43

## 2015-03-22 NOTE — Progress Notes (Signed)
Patient Care Team: Thressa Sheller, MD as PCP - General (Internal Medicine) Erroll Luna, MD as Consulting Physician (General Surgery) Nicholas Lose, MD as Consulting Physician (Hematology and Oncology) Gery Pray, MD as Consulting Physician (Radiation Oncology) Mauro Kaufmann, RN as Registered Nurse Rockwell Germany, RN as Registered Nurse  DIAGNOSIS: Breast cancer of upper-outer quadrant of left female breast   Staging form: Breast, AJCC 7th Edition     Clinical stage from 03/07/2015: Stage IIB (T2, N1, M0) - Unsigned       Staging comments: Staged at breast conference on 5.18.16    SUMMARY OF ONCOLOGIC HISTORY:   Breast cancer of upper-outer quadrant of left female breast   02/15/2015 Mammogram Left breast increased density, mass is irregular with microcalcifications measuring 2.9 cm, cyst in the breast as well as abnormal enlarged lymph nodes   02/20/2015 Initial Diagnosis Left breast biopsy: Invasive ductal carcinoma with DCIS, axillary lymph node biopsy positive, ER 0%, PR 0%, HER-2 negative ratio 1.13 with Ki-67 75%   03/15/2015 PET scan 2.8 cm solid irregular left breast mass which is hypermetabolic and consistent with known left breast cancer. 2. Weakly positive axillary lymph nodes or equivocal   03/20/2015 Breast MRI 3.1 cm irregular enhancing mass located within the left breast at the 1 o'clock position; 1.6 cm Left axillary LN   03/22/2015 -  Neo-Adjuvant Chemotherapy Neo-adjuvant chemotherapy with dose dense Adriamycin and Cytoxan 4 followed by Abraxane weekly 12    CHIEF COMPLIANT: Cycle 1 day 1 dose dense Adriamycin and Cytoxan  INTERVAL HISTORY: Carla Bennett is a above-mentioned history of left breast cancer starting new adjuvant chemotherapy with dose dense Adriamycin and Cytoxan today. She has triple negative disease with positive axillary lymph node. She underwent port placement and echocardiogram showed an ejection fraction of 55-60%. She underwent chemotherapy  education and is here today to start first cycle of chemotherapy.  REVIEW OF SYSTEMS:   Constitutional: Denies fevers, chills or abnormal weight loss Eyes: Denies blurriness of vision Ears, nose, mouth, throat, and face: Denies mucositis or sore throat Respiratory: Denies cough, dyspnea or wheezes Cardiovascular: Denies palpitation, chest discomfort or lower extremity swelling Gastrointestinal:  Denies nausea, heartburn or change in bowel habits Skin: Denies abnormal skin rashes Lymphatics: Denies new lymphadenopathy or easy bruising Neurological:Denies numbness, tingling or new weaknesses Behavioral/Psych: Mood is stable, no new changes  All other systems were reviewed with the patient and are negative.  I have reviewed the past medical history, past surgical history, social history and family history with the patient and they are unchanged from previous note.  ALLERGIES:  has No Known Allergies.  MEDICATIONS:  Current Outpatient Prescriptions  Medication Sig Dispense Refill  . B Complex Vitamins (VITAMIN B COMPLEX PO) Take by mouth daily.    Marland Kitchen dexamethasone (DECADRON) 4 MG tablet Take 1 tablet (4 mg total) by mouth 2 (two) times daily. Take 2 tablets by mouth once a day on the day after chemotherapy and then take 2 tablets two times a day for 2 days. Take with food. 30 tablet 1  . estradiol (ESTRACE) 0.5 MG tablet Take 1/2 tablet daily. (Patient not taking: Reported on 03/14/2015) 45 tablet 4  . furosemide (LASIX) 20 MG tablet Take 20 mg by mouth daily.     Marland Kitchen labetalol (NORMODYNE) 200 MG tablet Take 200 mg by mouth 2 (two) times daily.  6  . lidocaine-prilocaine (EMLA) cream Apply to port-a-cath 1-2 hours prior to access. 30 g 3  . lisinopril (PRINIVIL,ZESTRIL)  40 MG tablet Take 40 tablets by mouth daily.    Marland Kitchen loratadine (CLARITIN) 10 MG tablet Take 10 mg by mouth daily.    Marland Kitchen LORazepam (ATIVAN) 0.5 MG tablet Take 1 tablet (0.5 mg total) by mouth at bedtime. 30 tablet 0  . Meloxicam  (MOBIC PO) Take 1 tablet by mouth as needed.    . ondansetron (ZOFRAN) 8 MG tablet Take 1 tablet (8 mg total) by mouth 2 (two) times daily as needed. Start on the third day after chemotherapy. 30 tablet 1  . oxyCODONE-acetaminophen (ROXICET) 5-325 MG per tablet Take 1 tablet by mouth every 4 (four) hours as needed. 30 tablet 0  . prochlorperazine (COMPAZINE) 10 MG tablet Take 1 tablet (10 mg total) by mouth every 6 (six) hours as needed (Nausea or vomiting). 30 tablet 1   No current facility-administered medications for this visit.    PHYSICAL EXAMINATION: ECOG PERFORMANCE STATUS: 1 - Symptomatic but completely ambulatory  There were no vitals filed for this visit. There were no vitals filed for this visit.  GENERAL:alert, no distress and comfortable SKIN: skin color, texture, turgor are normal, no rashes or significant lesions EYES: normal, Conjunctiva are pink and non-injected, sclera clear OROPHARYNX:no exudate, no erythema and lips, buccal mucosa, and tongue normal  NECK: supple, thyroid normal size, non-tender, without nodularity LYMPH:  no palpable lymphadenopathy in the cervical, axillary or inguinal LUNGS: clear to auscultation and percussion with normal breathing effort HEART: regular rate & rhythm and no murmurs and no lower extremity edema ABDOMEN:abdomen soft, non-tender and normal bowel sounds Musculoskeletal:no cyanosis of digits and no clubbing  NEURO: alert & oriented x 3 with fluent speech, no focal motor/sensory deficits  LABORATORY DATA:  I have reviewed the data as listed   Chemistry      Component Value Date/Time   NA 142 03/16/2015 1133   NA 143 03/13/2015 0807   K 4.0 03/16/2015 1133   K 3.8 03/13/2015 0807   CL 105 03/16/2015 1133   CO2 29 03/16/2015 1133   CO2 23 03/13/2015 0807   BUN 13 03/16/2015 1133   BUN 15.6 03/13/2015 0807   CREATININE 1.25* 03/16/2015 1133   CREATININE 1.3* 03/13/2015 0807      Component Value Date/Time   CALCIUM 11.1*  03/16/2015 1133   CALCIUM 10.6* 03/13/2015 0807   ALKPHOS 69 03/16/2015 1133   ALKPHOS 70 03/13/2015 0807   AST 22 03/16/2015 1133   AST 19 03/13/2015 0807   ALT 18 03/16/2015 1133   ALT 16 03/13/2015 0807   BILITOT 0.6 03/16/2015 1133   BILITOT 0.54 03/13/2015 0807       Lab Results  Component Value Date   WBC 10.3 03/22/2015   HGB 14.3 03/22/2015   HCT 42.6 03/22/2015   MCV 86.6 03/22/2015   PLT 303 03/22/2015   NEUTROABS 7.5* 03/22/2015   ASSESSMENT & PLAN:  Breast cancer of upper-outer quadrant of left female breast Left breast invasive ductal carcinoma grade 3 ER 0% PR 0% HER-2 negative Ki-67 75%, 2.9 cm mass with microcalcifications plus abnormal lymph nodes biopsy-proven to be breast cancer T2 N1 M0 stage IIB clinical stage PET-CT: Neg Breast MRI: 3.1 cm left breast mass, 1.6 cm Left Axillary LN  Treatment Plan:  1. Neoadjuvant chemotherapy: Dose dense Adriamycin and Cytoxan 4 followed by Abraxane weekly 12 2. Consideration for [facility] clinical trial for surgery 3. Followed by adjuvant radiation  Current Treatment: Dose Dense AC X 4 foll by Abraxane X 12; Today is  cycle 1 day 1 (given her advanced age, we are starting her chemotherapy and 50 mg/m2 doxorubicin and 500 mg/m2 Cytoxan)  Mild chronic kidney disease: Creatinine 1.2, for this no dose adjustment is necessary and I encouraged her to drink more water.  Goal of treatment: Cure RTC 1 week for tox check  No orders of the defined types were placed in this encounter.   The patient has a good understanding of the overall plan. she agrees with it. she will call with any problems that may develop before the next visit here.   Rulon Eisenmenger, MD

## 2015-03-22 NOTE — Patient Instructions (Signed)
[facility] Discharge Instructions for Patients Receiving Chemotherapy  Today you received the following chemotherapy agents Adriamycyin and Cytoxan.   To help prevent nausea and vomiting after your treatment, we encourage you to take your nausea medication as prescribed.   If you develop nausea and vomiting that is not controlled by your nausea medication, call the clinic.   BELOW ARE SYMPTOMS THAT SHOULD BE REPORTED IMMEDIATELY:  *FEVER GREATER THAN 100.5 F  *CHILLS WITH OR WITHOUT FEVER  NAUSEA AND VOMITING THAT IS NOT CONTROLLED WITH YOUR NAUSEA MEDICATION  *UNUSUAL SHORTNESS OF BREATH  *UNUSUAL BRUISING OR BLEEDING  TENDERNESS IN MOUTH AND THROAT WITH OR WITHOUT PRESENCE OF ULCERS  *URINARY PROBLEMS  *BOWEL PROBLEMS  UNUSUAL RASH Items with * indicate a potential emergency and should be followed up as soon as possible.  Feel free to call the clinic you have any questions or concerns. The clinic phone number is [phone].  Please show the [place] at check-in to the Emergency Department and triage nurse.

## 2015-03-22 NOTE — Progress Notes (Signed)
Spiritual Care Note  Met with Carla Bennett and daughter Verner Mould?) in infusion room today.  They brightened at an opportunity for follow-up conversation, using our time to continue to build rapport and to process their updates.  Carla Bennett verbalized how much receiving my card meant to her.  Per pt, she is also working at maintaining a positive attitude, using perspective and gratitude, for coping and healing.  We talked about how starting the chemo process is a big milestone--and that beginning the actual process is helping to reduce anxiety and to increase focus.  They report good support; son Marden Noble is planning to coordinate communication among supporters.  Suggested online tools such as Product manager for update and Animal nutritionist.Provided pastoral presence, reflective listening, normalization of feelings, affirmation of coping strategies, and emotional support.  Following, but please also page as needs arise.  Thank you.  [place], [place]

## 2015-03-23 ENCOUNTER — Telehealth: Payer: Self-pay | Admitting: *Deleted

## 2015-03-23 ENCOUNTER — Telehealth: Payer: Self-pay | Admitting: Hematology and Oncology

## 2015-03-23 NOTE — Progress Notes (Signed)
Late entry-03/22/15 Met with pt during 1st chemotherapy treatment. Relate she is doing well. Denies questions or needs at this time. Request nutrition consult. Referral placed. Encourage pt to call with concerns. Received verbal understanding.

## 2015-03-23 NOTE — Telephone Encounter (Signed)
Patient received 1st AC yesterday. Left VMM for patient to return call and speak with any nurse.

## 2015-03-23 NOTE — Telephone Encounter (Signed)
Called and spoke with patient about her new schedule,aware

## 2015-03-23 NOTE — Telephone Encounter (Signed)
PT. IS EATING SMALL FREQUENT MEALS AND FORCING FLUIDS. NO NAUSEA OR VOMITING. NO DIARRHEA. SHE HAD A GOOD BOWEL MOVEMENT YESTERDAY. NO MOUTH ISSUES. PT. IS SLIGHTLY FATIGUED. INSTRUCTED PT. TO HAVE REST PERIODS DURING THE DAY. PT. WILL CALL THIS OFFICE IF THE NEED ARISES.

## 2015-03-30 ENCOUNTER — Other Ambulatory Visit (HOSPITAL_BASED_OUTPATIENT_CLINIC_OR_DEPARTMENT_OTHER): Payer: Medicare Other

## 2015-03-30 ENCOUNTER — Ambulatory Visit (HOSPITAL_BASED_OUTPATIENT_CLINIC_OR_DEPARTMENT_OTHER): Payer: Medicare Other | Admitting: Physician Assistant

## 2015-03-30 ENCOUNTER — Encounter: Payer: Medicare Other | Admitting: Nutrition

## 2015-03-30 ENCOUNTER — Encounter: Payer: Self-pay | Admitting: General Practice

## 2015-03-30 ENCOUNTER — Ambulatory Visit: Payer: Medicare Other | Admitting: Physician Assistant

## 2015-03-30 ENCOUNTER — Encounter: Payer: Self-pay | Admitting: Nutrition

## 2015-03-30 ENCOUNTER — Encounter: Payer: Self-pay | Admitting: Physician Assistant

## 2015-03-30 ENCOUNTER — Telehealth: Payer: Self-pay | Admitting: Hematology and Oncology

## 2015-03-30 ENCOUNTER — Other Ambulatory Visit: Payer: Medicare Other

## 2015-03-30 VITALS — BP 145/57 | HR 77 | Temp 97.4°F | Resp 18 | Ht 61.0 in | Wt 147.3 lb

## 2015-03-30 DIAGNOSIS — C773 Secondary and unspecified malignant neoplasm of axilla and upper limb lymph nodes: Secondary | ICD-10-CM | POA: Diagnosis not present

## 2015-03-30 DIAGNOSIS — C50412 Malignant neoplasm of upper-outer quadrant of left female breast: Secondary | ICD-10-CM | POA: Diagnosis not present

## 2015-03-30 DIAGNOSIS — Z171 Estrogen receptor negative status [ER-]: Secondary | ICD-10-CM | POA: Diagnosis not present

## 2015-03-30 DIAGNOSIS — N182 Chronic kidney disease, stage 2 (mild): Secondary | ICD-10-CM | POA: Diagnosis not present

## 2015-03-30 LAB — CBC WITH DIFFERENTIAL/PLATELET
BASO%: 0.9 % (ref 0.0–2.0)
Basophils Absolute: 0 10*3/uL (ref 0.0–0.1)
EOS%: 3.8 % (ref 0.0–7.0)
Eosinophils Absolute: 0.2 10*3/uL (ref 0.0–0.5)
HCT: 37.6 % (ref 34.8–46.6)
HGB: 12.4 g/dL (ref 11.6–15.9)
LYMPH#: 1.5 10*3/uL (ref 0.9–3.3)
LYMPH%: 29.9 % (ref 14.0–49.7)
MCH: 28.3 pg (ref 25.1–34.0)
MCHC: 33.1 g/dL (ref 31.5–36.0)
MCV: 85.4 fL (ref 79.5–101.0)
MONO#: 0.5 10*3/uL (ref 0.1–0.9)
MONO%: 10.4 % (ref 0.0–14.0)
NEUT#: 2.8 10*3/uL (ref 1.5–6.5)
NEUT%: 55 % (ref 38.4–76.8)
PLATELETS: 235 10*3/uL (ref 145–400)
RBC: 4.4 10*6/uL (ref 3.70–5.45)
RDW: 13.2 % (ref 11.2–14.5)
WBC: 5.1 10*3/uL (ref 3.9–10.3)

## 2015-03-30 LAB — COMPREHENSIVE METABOLIC PANEL (CC13)
ALK PHOS: 78 U/L (ref 40–150)
ALT: 13 U/L (ref 0–55)
AST: 13 U/L (ref 5–34)
Albumin: 3.2 g/dL — ABNORMAL LOW (ref 3.5–5.0)
Anion Gap: 10 mEq/L (ref 3–11)
BUN: 14.7 mg/dL (ref 7.0–26.0)
CALCIUM: 10.7 mg/dL — AB (ref 8.4–10.4)
CHLORIDE: 101 meq/L (ref 98–109)
CO2: 28 mEq/L (ref 22–29)
Creatinine: 1 mg/dL (ref 0.6–1.1)
EGFR: 51 mL/min/{1.73_m2} — AB (ref >90)
GLUCOSE: 99 mg/dL (ref 70–140)
Potassium: 4.4 mEq/L (ref 3.5–5.1)
Sodium: 139 mEq/L (ref 136–145)
Total Bilirubin: 0.56 mg/dL (ref 0.20–1.20)
Total Protein: 6.3 g/dL — ABNORMAL LOW (ref 6.4–8.3)

## 2015-03-30 NOTE — Telephone Encounter (Signed)
Pt states AJ/PA wanted MD/ML put on schedule with next week's chemo visit on 06/16, scheduled and pt confirmed.  Pt is aware of the time frame between f/u & chemo, pt wants it to stay this way so that she can go get some lunch before going to chemo... KJ

## 2015-03-30 NOTE — Progress Notes (Signed)
Spiritual Care Note  Had lengthy pastoral encounter with Connie's daughter Mickel Baas and then with Marlowe Kays, too.  Mickel Baas shared about transitions in her life, using opportunity to process meaning, hopes, and new opportunities.  Marlowe Kays was distressed and nearly tearful about having gotten confused about time, missing one of her appointments this afternoon.  She stated that her brain seemed to be fuzzy after a bout of diarrhea, and she lost track of the actual first appointment time; per pt, then she missed her turn to the hospital, though a familiar route, which increased her stress.  She verbalized some guilt, but shame in particular:  "I'm usually a very responsible person.  This is NOT how I am.  I need to take responsibility for being so late."  Provided pastoral presence and reflective listening, normalizing effects of stress and encouraging her to share these concerns with her providers so that they can provide guidance and/or reassurance.  She thanked me for calming presence and support.   Following for support, but please also page as needs arise.  Thank you.  [place], [place]

## 2015-03-30 NOTE — Progress Notes (Signed)
Received call that patient didn't know nutrition appointment time and would be at least 30 minutes late. Patient agreed to reschedule appointment.

## 2015-03-30 NOTE — Telephone Encounter (Signed)
Pt came in and needed to r/s missed appt...done...the patient ok and aware

## 2015-03-30 NOTE — Progress Notes (Signed)
Patient Care Team: Thressa Sheller, MD as PCP - General (Internal Medicine) Erroll Luna, MD as Consulting Physician (General Surgery) Nicholas Lose, MD as Consulting Physician (Hematology and Oncology) Gery Pray, MD as Consulting Physician (Radiation Oncology) Mauro Kaufmann, RN as Registered Nurse Rockwell Germany, RN as Registered Nurse  DIAGNOSIS: Breast cancer of upper-outer quadrant of left female breast   Staging form: Breast, AJCC 7th Edition     Clinical stage from 03/07/2015: Stage IIB (T2, N1, M0) - Unsigned       Staging comments: Staged at breast conference on 5.18.16    SUMMARY OF ONCOLOGIC HISTORY:   Breast cancer of upper-outer quadrant of left female breast   02/15/2015 Mammogram Left breast increased density, mass is irregular with microcalcifications measuring 2.9 cm, cyst in the breast as well as abnormal enlarged lymph nodes   02/20/2015 Initial Diagnosis Left breast biopsy: Invasive ductal carcinoma with DCIS, axillary lymph node biopsy positive, ER 0%, PR 0%, HER-2 negative ratio 1.13 with Ki-67 75%   03/15/2015 PET scan 2.8 cm solid irregular left breast mass which is hypermetabolic and consistent with known left breast cancer. 2. Weakly positive axillary lymph nodes or equivocal   03/20/2015 Breast MRI 3.1 cm irregular enhancing mass located within the left breast at the 1 o'clock position; 1.6 cm Left axillary LN   03/22/2015 -  Neo-Adjuvant Chemotherapy Neo-adjuvant chemotherapy with dose dense Adriamycin and Cytoxan 4 followed by Abraxane weekly 12    CHIEF COMPLIANT: Cycle 1 day 1 dose dense Adriamycin and Cytoxan. Status post 1 cycle.  INTERVAL HISTORY: Carla Bennett is a above-mentioned history of left breast cancer. She presents today for symptom management visit after completing her first cycle of adjuvant chemotherapy with dose dense Adriamycin and Cytoxan. She is accompanied today by her daughter. She has triple negative disease with positive axillary  lymph node. She underwent port placement and echocardiogram showed an ejection fraction of 55-60%. Overall she tolerated her first cycle relatively well with the exception of some fatigue, nausea, vomiting and diarrhea. She had a period of approximate 4 hours where she just felt very weak when the nausea vomiting diarrhea or at its worst. The symptoms have subsided and she feels well today without specific complaints.   REVIEW OF SYSTEMS:   Constitutional: Denies fevers, chills or abnormal weight loss Eyes: Denies blurriness of vision Ears, nose, mouth, throat, and face: Denies mucositis or sore throat Respiratory: Denies cough, dyspnea or wheezes Cardiovascular: Denies palpitation, chest discomfort or lower extremity swelling Gastrointestinal:  Denies nausea, heartburn or change in bowel habits Skin: Denies abnormal skin rashes Lymphatics: Denies new lymphadenopathy or easy bruising Neurological:Denies numbness, tingling or new weaknesses Behavioral/Psych: Mood is stable, no new changes  All other systems were reviewed with the patient and are negative.  I have reviewed the past medical history, past surgical history, social history and family history with the patient and they are unchanged from previous note.  ALLERGIES:  has No Known Allergies.  MEDICATIONS:  Current Outpatient Prescriptions  Medication Sig Dispense Refill  . B Complex Vitamins (VITAMIN B COMPLEX PO) Take by mouth daily.    Marland Kitchen dexamethasone (DECADRON) 4 MG tablet Take 1 tablet (4 mg total) by mouth 2 (two) times daily. Take 2 tablets by mouth once a day on the day after chemotherapy and then take 2 tablets two times a day for 2 days. Take with food. 30 tablet 1  . furosemide (LASIX) 20 MG tablet Take 20 mg by mouth daily.     Marland Kitchen  labetalol (NORMODYNE) 200 MG tablet Take 200 mg by mouth 2 (two) times daily.  6  . lidocaine-prilocaine (EMLA) cream Apply to port-a-cath 1-2 hours prior to access. 30 g 3  . lisinopril  (PRINIVIL,ZESTRIL) 40 MG tablet Take 40 mg by mouth daily.     Marland Kitchen loratadine (CLARITIN) 10 MG tablet Take 10 mg by mouth daily.    Marland Kitchen LORazepam (ATIVAN) 0.5 MG tablet Take 1 tablet (0.5 mg total) by mouth at bedtime. 30 tablet 0  . Meloxicam (MOBIC PO) Take 1 tablet by mouth as needed.    . ondansetron (ZOFRAN) 8 MG tablet Take 1 tablet (8 mg total) by mouth 2 (two) times daily as needed. Start on the third day after chemotherapy. 30 tablet 1  . oxyCODONE-acetaminophen (ROXICET) 5-325 MG per tablet Take 1 tablet by mouth every 4 (four) hours as needed. 30 tablet 0  . prochlorperazine (COMPAZINE) 10 MG tablet Take 1 tablet (10 mg total) by mouth every 6 (six) hours as needed (Nausea or vomiting). 30 tablet 1  . estradiol (ESTRACE) 0.5 MG tablet Take 1/2 tablet daily. (Patient not taking: Reported on 03/30/2015) 45 tablet 4   No current facility-administered medications for this visit.    PHYSICAL EXAMINATION: ECOG PERFORMANCE STATUS: 1 - Symptomatic but completely ambulatory  Filed Vitals:   03/30/15 1343  BP: 145/57  Pulse: 77  Temp: 97.4 F (36.3 C)  Resp: 18   Filed Weights   03/30/15 1343  Weight: 147 lb 4.8 oz (66.815 kg)    GENERAL:alert, no distress and comfortable SKIN: skin color, texture, turgor are normal, no rashes or significant lesions EYES: normal, Conjunctiva are pink and non-injected, sclera clear OROPHARYNX:no exudate, no erythema and lips, buccal mucosa, and tongue normal  NECK: supple, thyroid normal size, non-tender, without nodularity LYMPH:  no palpable lymphadenopathy in the cervical, axillary or inguinal LUNGS: clear to auscultation and percussion with normal breathing effort HEART: regular rate & rhythm and no murmurs and no lower extremity edema ABDOMEN:abdomen soft, non-tender and normal bowel sounds Musculoskeletal:no cyanosis of digits and no clubbing  NEURO: alert & oriented x 3 with fluent speech, no focal motor/sensory deficits Right anterior chest  Port-A-Cath is in place with well-healing incisions and resolving ecchymosis  LABORATORY DATA:  I have reviewed the data as listed   Chemistry      Component Value Date/Time   NA 139 03/30/2015 1327   NA 142 03/16/2015 1133   K 4.4 03/30/2015 1327   K 4.0 03/16/2015 1133   CL 105 03/16/2015 1133   CO2 28 03/30/2015 1327   CO2 29 03/16/2015 1133   BUN 14.7 03/30/2015 1327   BUN 13 03/16/2015 1133   CREATININE 1.0 03/30/2015 1327   CREATININE 1.25* 03/16/2015 1133      Component Value Date/Time   CALCIUM 10.7* 03/30/2015 1327   CALCIUM 11.1* 03/16/2015 1133   ALKPHOS 78 03/30/2015 1327   ALKPHOS 69 03/16/2015 1133   AST 13 03/30/2015 1327   AST 22 03/16/2015 1133   ALT 13 03/30/2015 1327   ALT 18 03/16/2015 1133   BILITOT 0.56 03/30/2015 1327   BILITOT 0.6 03/16/2015 1133       Lab Results  Component Value Date   WBC 5.1 03/30/2015   HGB 12.4 03/30/2015   HCT 37.6 03/30/2015   MCV 85.4 03/30/2015   PLT 235 03/30/2015   NEUTROABS 2.8 03/30/2015   ASSESSMENT & PLAN:  Breast cancer of upper-outer quadrant of left female breast Left breast invasive  ductal carcinoma grade 3 ER 0% PR 0% HER-2 negative Ki-67 75%, 2.9 cm mass with microcalcifications plus abnormal lymph nodes biopsy-proven to be breast cancer T2 N1 M0 stage IIB clinical stage PET-CT: Neg Breast MRI: 3.1 cm left breast mass, 1.6 cm Left Axillary LN  Treatment Plan:  1. Neoadjuvant chemotherapy: Dose dense Adriamycin and Cytoxan 4 followed by Abraxane weekly 12 2. Consideration for [facility] clinical trial for surgery 3. Followed by adjuvant radiation  Current Treatment: Dose Dense AC X 4 foll by Abraxane X 12; Today is cycle 1 day 1 (given her advanced age, we are starting her chemotherapy and 50 mg/m2 doxorubicin and 500 mg/m2 Cytoxan)  Mild chronic kidney disease: Creatinine has improved to 1.0. She is again encouraged to stay well hydrated.   Goal of treatment: Cure RTC 1 week for reevaluation  prior to the start of cycle #2  No orders of the defined types were placed in this encounter.   The patient is a pleasant 80 year old Caucasian female with stage IIB (T2, N1, M0) triple negative breast carcinoma. She is currently being treated with adjuvant chemotherapy in the form of dose dense Adriamycin and Cytoxan, status post 1 cycle. Overall she tolerated the first cycle relatively well with the exception of fatigue, nausea and vomiting and diarrhea. These symptoms have resolved. She will follow-up in one week for reevaluation prior to start of cycle #2 of her adjuvant chemotherapy with dose dense Adriamycin and Cytoxan.  The patient has a good understanding of the overall plan. she agrees with it. she will call with any problems that may develop before the next visit here.   Carlton Adam, PA-C  03/30/2015

## 2015-04-01 NOTE — Patient Instructions (Signed)
Continue labs and chemotherapy as scheduled Follow-up in one week

## 2015-04-04 ENCOUNTER — Ambulatory Visit: Payer: Medicare Other

## 2015-04-05 ENCOUNTER — Telehealth: Payer: Self-pay | Admitting: Hematology and Oncology

## 2015-04-05 ENCOUNTER — Ambulatory Visit (HOSPITAL_BASED_OUTPATIENT_CLINIC_OR_DEPARTMENT_OTHER): Payer: Medicare Other | Admitting: Oncology

## 2015-04-05 ENCOUNTER — Other Ambulatory Visit (HOSPITAL_BASED_OUTPATIENT_CLINIC_OR_DEPARTMENT_OTHER): Payer: Medicare Other

## 2015-04-05 ENCOUNTER — Encounter: Payer: Self-pay | Admitting: Oncology

## 2015-04-05 ENCOUNTER — Telehealth: Payer: Self-pay | Admitting: Oncology

## 2015-04-05 ENCOUNTER — Ambulatory Visit: Payer: Medicare Other | Admitting: Nutrition

## 2015-04-05 ENCOUNTER — Ambulatory Visit: Payer: Medicare Other

## 2015-04-05 ENCOUNTER — Ambulatory Visit (HOSPITAL_BASED_OUTPATIENT_CLINIC_OR_DEPARTMENT_OTHER): Payer: Medicare Other

## 2015-04-05 ENCOUNTER — Encounter: Payer: Self-pay | Admitting: *Deleted

## 2015-04-05 VITALS — BP 143/65 | HR 69 | Temp 97.6°F | Resp 18 | Ht 61.0 in | Wt 148.8 lb

## 2015-04-05 DIAGNOSIS — Z5111 Encounter for antineoplastic chemotherapy: Secondary | ICD-10-CM | POA: Diagnosis not present

## 2015-04-05 DIAGNOSIS — Z171 Estrogen receptor negative status [ER-]: Secondary | ICD-10-CM | POA: Diagnosis not present

## 2015-04-05 DIAGNOSIS — Z5189 Encounter for other specified aftercare: Secondary | ICD-10-CM | POA: Diagnosis not present

## 2015-04-05 DIAGNOSIS — N182 Chronic kidney disease, stage 2 (mild): Secondary | ICD-10-CM | POA: Diagnosis not present

## 2015-04-05 DIAGNOSIS — C773 Secondary and unspecified malignant neoplasm of axilla and upper limb lymph nodes: Secondary | ICD-10-CM | POA: Diagnosis not present

## 2015-04-05 DIAGNOSIS — C50412 Malignant neoplasm of upper-outer quadrant of left female breast: Secondary | ICD-10-CM

## 2015-04-05 LAB — CBC WITH DIFFERENTIAL/PLATELET
BASO%: 0.5 % (ref 0.0–2.0)
BASOS ABS: 0.1 10*3/uL (ref 0.0–0.1)
EOS%: 0.3 % (ref 0.0–7.0)
Eosinophils Absolute: 0.1 10*3/uL (ref 0.0–0.5)
HEMATOCRIT: 39.1 % (ref 34.8–46.6)
HEMOGLOBIN: 13 g/dL (ref 11.6–15.9)
LYMPH#: 1.9 10*3/uL (ref 0.9–3.3)
LYMPH%: 10.1 % — ABNORMAL LOW (ref 14.0–49.7)
MCH: 28.9 pg (ref 25.1–34.0)
MCHC: 33.2 g/dL (ref 31.5–36.0)
MCV: 86.9 fL (ref 79.5–101.0)
MONO#: 1.2 10*3/uL — AB (ref 0.1–0.9)
MONO%: 6.2 % (ref 0.0–14.0)
NEUT%: 82.9 % — AB (ref 38.4–76.8)
NEUTROS ABS: 15.5 10*3/uL — AB (ref 1.5–6.5)
Platelets: 330 10*3/uL (ref 145–400)
RBC: 4.5 10*6/uL (ref 3.70–5.45)
RDW: 13.4 % (ref 11.2–14.5)
WBC: 18.7 10*3/uL — AB (ref 3.9–10.3)

## 2015-04-05 LAB — COMPREHENSIVE METABOLIC PANEL (CC13)
ALT: 15 U/L (ref 0–55)
AST: 15 U/L (ref 5–34)
Albumin: 3.6 g/dL (ref 3.5–5.0)
Alkaline Phosphatase: 92 U/L (ref 40–150)
Anion Gap: 9 mEq/L (ref 3–11)
BILIRUBIN TOTAL: 0.34 mg/dL (ref 0.20–1.20)
BUN: 10.2 mg/dL (ref 7.0–26.0)
CALCIUM: 10.1 mg/dL (ref 8.4–10.4)
CHLORIDE: 107 meq/L (ref 98–109)
CO2: 25 meq/L (ref 22–29)
CREATININE: 1.1 mg/dL (ref 0.6–1.1)
EGFR: 48 mL/min/{1.73_m2} — AB (ref >90)
GLUCOSE: 121 mg/dL (ref 70–140)
Potassium: 3.9 mEq/L (ref 3.5–5.1)
Sodium: 140 mEq/L (ref 136–145)
TOTAL PROTEIN: 6.6 g/dL (ref 6.4–8.3)

## 2015-04-05 MED ORDER — SODIUM CHLORIDE 0.9 % IV SOLN
Freq: Once | INTRAVENOUS | Status: AC
  Administered 2015-04-05: 13:00:00 via INTRAVENOUS

## 2015-04-05 MED ORDER — DOXORUBICIN HCL CHEMO IV INJECTION 2 MG/ML
50.0000 mg/m2 | Freq: Once | INTRAVENOUS | Status: AC
Start: 2015-04-05 — End: 2015-04-05
  Administered 2015-04-05: 86 mg via INTRAVENOUS
  Filled 2015-04-05: qty 43

## 2015-04-05 MED ORDER — PEGFILGRASTIM 6 MG/0.6ML ~~LOC~~ PSKT
6.0000 mg | PREFILLED_SYRINGE | Freq: Once | SUBCUTANEOUS | Status: AC
  Administered 2015-04-05: 6 mg via SUBCUTANEOUS
  Filled 2015-04-05: qty 0.6

## 2015-04-05 MED ORDER — PALONOSETRON HCL INJECTION 0.25 MG/5ML
INTRAVENOUS | Status: AC
  Filled 2015-04-05: qty 5

## 2015-04-05 MED ORDER — SODIUM CHLORIDE 0.9 % IV SOLN
Freq: Once | INTRAVENOUS | Status: AC
  Administered 2015-04-05: 13:00:00 via INTRAVENOUS
  Filled 2015-04-05: qty 5

## 2015-04-05 MED ORDER — HEPARIN SOD (PORK) LOCK FLUSH 100 UNIT/ML IV SOLN
500.0000 [IU] | Freq: Once | INTRAVENOUS | Status: AC | PRN
  Administered 2015-04-05: 500 [IU]
  Filled 2015-04-05: qty 5

## 2015-04-05 MED ORDER — PALONOSETRON HCL INJECTION 0.25 MG/5ML
0.2500 mg | Freq: Once | INTRAVENOUS | Status: AC
  Administered 2015-04-05: 0.25 mg via INTRAVENOUS

## 2015-04-05 MED ORDER — SODIUM CHLORIDE 0.9 % IJ SOLN
10.0000 mL | INTRAMUSCULAR | Status: DC | PRN
  Administered 2015-04-05: 10 mL
  Filled 2015-04-05: qty 10

## 2015-04-05 MED ORDER — CYCLOPHOSPHAMIDE CHEMO INJECTION 1 GM
500.0000 mg/m2 | Freq: Once | INTRAMUSCULAR | Status: AC
  Administered 2015-04-05: 860 mg via INTRAVENOUS
  Filled 2015-04-05: qty 43

## 2015-04-05 NOTE — Telephone Encounter (Signed)
pt req to have flush appt added to sch-gave updated copy of avs

## 2015-04-05 NOTE — Progress Notes (Signed)
Patient Care Team: Thressa Sheller, MD as PCP - General (Internal Medicine) Erroll Luna, MD as Consulting Physician (General Surgery) Nicholas Lose, MD as Consulting Physician (Hematology and Oncology) Gery Pray, MD as Consulting Physician (Radiation Oncology) Mauro Kaufmann, RN as Registered Nurse Rockwell Germany, RN as Registered Nurse  DIAGNOSIS: Breast cancer of upper-outer quadrant of left female breast   Staging form: Breast, AJCC 7th Edition     Clinical stage from 03/07/2015: Stage IIB (T2, N1, M0) - Unsigned       Staging comments: Staged at breast conference on 5.18.16    SUMMARY OF ONCOLOGIC HISTORY:   Breast cancer of upper-outer quadrant of left female breast   02/15/2015 Mammogram Left breast increased density, mass is irregular with microcalcifications measuring 2.9 cm, cyst in the breast as well as abnormal enlarged lymph nodes   02/20/2015 Initial Diagnosis Left breast biopsy: Invasive ductal carcinoma with DCIS, axillary lymph node biopsy positive, ER 0%, PR 0%, HER-2 negative ratio 1.13 with Ki-67 75%   03/15/2015 PET scan 2.8 cm solid irregular left breast mass which is hypermetabolic and consistent with known left breast cancer. 2. Weakly positive axillary lymph nodes or equivocal   03/20/2015 Breast MRI 3.1 cm irregular enhancing mass located within the left breast at the 1 o'clock position; 1.6 cm Left axillary LN   03/22/2015 -  Neo-Adjuvant Chemotherapy Neo-adjuvant chemotherapy with dose dense Adriamycin and Cytoxan 4 followed by Abraxane weekly 12    CHIEF COMPLIANT: Cycle 2 day 1 dose dense Adriamycin and Cytoxan.   INTERVAL HISTORY: Carla Bennett is a above-mentioned history of left breast cancer. She presents today prior to cycle 2 of neo-adjuvant chemotherapy with dose dense Adriamycin and Cytoxan. She is accompanied today by her daughter. She has triple negative disease with positive axillary lymph node. She underwent port placement and echocardiogram  showed an ejection fraction of 55-60%. Overall she she is feeling well today. She has mild fatigue but denies fevers, chest pain, shortness of breath, abdominal pain, nausea, vomiting, and diarrhea. The patient is ready to proceed with cycle 2 for chemotherapy today as scheduled.   REVIEW OF SYSTEMS:   Constitutional: Denies fevers, chills or abnormal weight loss Eyes: Denies blurriness of vision Ears, nose, mouth, throat, and face: Denies mucositis or sore throat Respiratory: Denies cough, dyspnea or wheezes Cardiovascular: Denies palpitation, chest discomfort or lower extremity swelling Gastrointestinal:  Denies nausea, heartburn or change in bowel habits Skin: Denies abnormal skin rashes Lymphatics: Denies new lymphadenopathy or easy bruising Neurological:Denies numbness, tingling or new weaknesses Behavioral/Psych: Mood is stable, no new changes  All other systems were reviewed with the patient and are negative.  I have reviewed the past medical history, past surgical history, social history and family history with the patient and they are unchanged from previous note.  ALLERGIES:  has No Known Allergies.  MEDICATIONS:  Current Outpatient Prescriptions  Medication Sig Dispense Refill  . B Complex Vitamins (VITAMIN B COMPLEX PO) Take by mouth daily.    Marland Kitchen dexamethasone (DECADRON) 4 MG tablet Take 1 tablet (4 mg total) by mouth 2 (two) times daily. Take 2 tablets by mouth once a day on the day after chemotherapy and then take 2 tablets two times a day for 2 days. Take with food. 30 tablet 1  . furosemide (LASIX) 20 MG tablet Take 20 mg by mouth daily.     Marland Kitchen labetalol (NORMODYNE) 200 MG tablet Take 200 mg by mouth 2 (two) times daily.  6  .  lidocaine-prilocaine (EMLA) cream Apply to port-a-cath 1-2 hours prior to access. 30 g 3  . lisinopril (PRINIVIL,ZESTRIL) 40 MG tablet Take 40 mg by mouth daily.     Marland Kitchen loratadine (CLARITIN) 10 MG tablet Take 10 mg by mouth daily.    Marland Kitchen LORazepam  (ATIVAN) 0.5 MG tablet Take 1 tablet (0.5 mg total) by mouth at bedtime. 30 tablet 0  . Meloxicam (MOBIC PO) Take 1 tablet by mouth as needed.    . ondansetron (ZOFRAN) 8 MG tablet Take 1 tablet (8 mg total) by mouth 2 (two) times daily as needed. Start on the third day after chemotherapy. 30 tablet 1  . oxyCODONE-acetaminophen (ROXICET) 5-325 MG per tablet Take 1 tablet by mouth every 4 (four) hours as needed. 30 tablet 0  . prochlorperazine (COMPAZINE) 10 MG tablet Take 1 tablet (10 mg total) by mouth every 6 (six) hours as needed (Nausea or vomiting). 30 tablet 1  . estradiol (ESTRACE) 0.5 MG tablet Take 1/2 tablet daily. (Patient not taking: Reported on 03/30/2015) 45 tablet 4   No current facility-administered medications for this visit.   Facility-Administered Medications Ordered in Other Visits  Medication Dose Route Frequency Provider Last Rate Last Dose  . sodium chloride 0.9 % injection 10 mL  10 mL Intracatheter PRN Nicholas Lose, MD   10 mL at 04/05/15 1406    PHYSICAL EXAMINATION: ECOG PERFORMANCE STATUS: 1 - Symptomatic but completely ambulatory  Filed Vitals:   04/05/15 1134  BP: 143/65  Pulse: 69  Temp: 97.6 F (36.4 C)  Resp: 18   Filed Weights   04/05/15 1134  Weight: 148 lb 12.8 oz (67.495 kg)    GENERAL:alert, no distress and comfortable SKIN: skin color, texture, turgor are normal, no rashes or significant lesions EYES: normal, Conjunctiva are pink and non-injected, sclera clear OROPHARYNX:no exudate, no erythema and lips, buccal mucosa, and tongue normal  NECK: supple, thyroid normal size, non-tender, without nodularity LYMPH:  no palpable lymphadenopathy in the cervical, axillary or inguinal LUNGS: clear to auscultation and percussion with normal breathing effort HEART: regular rate & rhythm and no murmurs and no lower extremity edema ABDOMEN:abdomen soft, non-tender and normal bowel sounds Musculoskeletal:no cyanosis of digits and no clubbing  NEURO:  alert & oriented x 3 with fluent speech, no focal motor/sensory deficits Right anterior chest Port-A-Cath is in place with well-healing incisions and resolving ecchymosis  LABORATORY DATA:  I have reviewed the data as listed   Chemistry      Component Value Date/Time   NA 140 04/05/2015 1119   NA 142 03/16/2015 1133   K 3.9 04/05/2015 1119   K 4.0 03/16/2015 1133   CL 105 03/16/2015 1133   CO2 25 04/05/2015 1119   CO2 29 03/16/2015 1133   BUN 10.2 04/05/2015 1119   BUN 13 03/16/2015 1133   CREATININE 1.1 04/05/2015 1119   CREATININE 1.25* 03/16/2015 1133      Component Value Date/Time   CALCIUM 10.1 04/05/2015 1119   CALCIUM 11.1* 03/16/2015 1133   ALKPHOS 92 04/05/2015 1119   ALKPHOS 69 03/16/2015 1133   AST 15 04/05/2015 1119   AST 22 03/16/2015 1133   ALT 15 04/05/2015 1119   ALT 18 03/16/2015 1133   BILITOT 0.34 04/05/2015 1119   BILITOT 0.6 03/16/2015 1133       Lab Results  Component Value Date   WBC 18.7* 04/05/2015   HGB 13.0 04/05/2015   HCT 39.1 04/05/2015   MCV 86.9 04/05/2015   PLT 330  04/05/2015   NEUTROABS 15.5* 04/05/2015   ASSESSMENT & PLAN:  Breast cancer of upper-outer quadrant of left female breast Left breast invasive ductal carcinoma grade 3 ER 0% PR 0% HER-2 negative Ki-67 75%, 2.9 cm mass with microcalcifications plus abnormal lymph nodes biopsy-proven to be breast cancer T2 N1 M0 stage IIB clinical stage PET-CT: Neg Breast MRI: 3.1 cm left breast mass, 1.6 cm Left Axillary LN  Treatment Plan:  1. Neoadjuvant chemotherapy: Dose dense Adriamycin and Cytoxan 4 followed by Abraxane weekly 12 2. Consideration for [facility] clinical trial for surgery 3. Followed by adjuvant radiation  Current Treatment: Dose Dense AC X 4 foll by Abraxane X 12; Today is cycle 2 day 1 (given her advanced age, we are starting her chemotherapy and 50 mg/m2 doxorubicin and 500 mg/m2 Cytoxan)  Mild chronic kidney disease: Creatinine has improved to 1.1. She is  again encouraged to stay well hydrated.   Goal of treatment: Cure RTC 2 weeks for reevaluation prior to the start of cycle #2  No orders of the defined types were placed in this encounter.  The patient has a good understanding of the overall plan. she agrees with it. she will call with any problems that may develop before the next visit here.   Mikey Bussing, NP  04/05/2015

## 2015-04-05 NOTE — Patient Instructions (Signed)
[facility] Discharge Instructions for Patients Receiving Chemotherapy  Today you received the following chemotherapy agents adriamycin/cytoxan  To help prevent nausea and vomiting after your treatment, we encourage you to take your nausea medication as directed  If you develop nausea and vomiting that is not controlled by your nausea medication, call the clinic.   BELOW ARE SYMPTOMS THAT SHOULD BE REPORTED IMMEDIATELY:  *FEVER GREATER THAN 100.5 F  *CHILLS WITH OR WITHOUT FEVER  NAUSEA AND VOMITING THAT IS NOT CONTROLLED WITH YOUR NAUSEA MEDICATION  *UNUSUAL SHORTNESS OF BREATH  *UNUSUAL BRUISING OR BLEEDING  TENDERNESS IN MOUTH AND THROAT WITH OR WITHOUT PRESENCE OF ULCERS  *URINARY PROBLEMS  *BOWEL PROBLEMS  UNUSUAL RASH Items with * indicate a potential emergency and should be followed up as soon as possible.  Feel free to call the clinic you have any questions or concerns. The clinic phone number is [phone].  

## 2015-04-05 NOTE — Progress Notes (Signed)
80 year old female diagnosed with triple A negative breast cancer.  She is a patient of Dr. Lindi Adie.  Past medical history includes diverticulosis, gout, arthritis, hypertension, stage III chronic kidney disease.  Medications include B complex vitamin, Decadron, Lasix, Ativan, Zofran, and Compazine.  Labs include BUN 14.7, creatinine 1.0, albumin 3.2 on June 10.  Height: 61 inches. Weight: 148 pounds. Usual body weight: 156 pounds. BMI: 27.85.  Patient requesting nutrition consult. Patient denies nausea, vomiting or diarrhea. She reports increased fatigue after her last chemotherapy. Patient reports her physician has asked her to avoid foods with both vitamin D and calcium.  Patient cannot explain reason Patient reports desired to drink milk on her cereal in have occasional pieces of cheese.  Nutrition diagnosis: Food and nutrition related knowledge deficit related to diagnosis of breast cancer and associated treatments as evidenced by no prior need for nutrition related information.  Intervention:  Patient was educated to consume small frequent meals with adequate calories and protein to promote weight maintenance. Brief education provided on strategies for eating if she develops nausea, vomiting, diarrhea or constipation. Encouraged patient to get clarification regarding avoiding vitamin D and calcium in foods. Recommended patient continue increased fluid intake. Questions were answered.  Teach back method used.  Fact sheets were provided and contact information was given.  Monitoring, evaluation, goals: Patient will tolerate adequate calories and protein to minimize weight loss throughout treatment.  Next visit: Patient will contact me for questions or concerns.    **Disclaimer: This note was dictated with voice recognition software. Similar sounding words can inadvertently be transcribed and this note may contain transcription errors which may not have been corrected upon  publication of note.**

## 2015-04-05 NOTE — Telephone Encounter (Signed)
Appointments made per pof and patient will get a new schedule in chemo today

## 2015-04-06 NOTE — Progress Notes (Signed)
Late entry 04/05/15 Oncology Nurse Navigator Documentation  Oncology Nurse Navigator Flowsheets 04/06/2015  Navigator Encounter Type Treatment  Patient Visit Type Medonc  Treatment Phase Treatment  Barriers/Navigation Needs No barriers at this time  Time Spent with Patient 15   Met with pt prior to chemo. Relate doing well and without complaints. Discussed nutrition consults and pt thoughts. Informed she liked the consult but would be interested in a class. Discussed "cooking classes" we hold and would reach out to support services to get her registered. Denies further needs. Encourage pt to call with questions or concerns. Received verbal understanding.

## 2015-04-09 ENCOUNTER — Encounter: Payer: Self-pay | Admitting: Genetic Counselor

## 2015-04-09 DIAGNOSIS — Z800 Family history of malignant neoplasm of digestive organs: Secondary | ICD-10-CM

## 2015-04-09 DIAGNOSIS — Z803 Family history of malignant neoplasm of breast: Secondary | ICD-10-CM

## 2015-04-09 DIAGNOSIS — C50912 Malignant neoplasm of unspecified site of left female breast: Secondary | ICD-10-CM

## 2015-04-09 NOTE — Progress Notes (Signed)
°Carla Bennett °Genetic Test Results ° ° °REFERRING PROVIDER: °Dr. [name] ° °PRIMARY PROVIDER:  °MACKENZIE,BRIAN, MD ° °GENETIC TEST RESULT:  °Testing Laboratory: [facility]  °Test Ordered: OvaNext gene panel °Date of Report: 03/30/2015 °Result: Normal, no pathogenic mutations identified °Other: Variant of uncertain significance identified called PMS2, p.V717M °General Interpretation: Reassuring ° °HPI: Carla Bennett was previously seen in the [facility] due to concerns regarding a hereditary predisposition to cancer. Please refer to our prior cancer genetics Bennett note for more information regarding Carla Bennett's medical, social and family histories, and our assessment and recommendations, at the time. Carla Bennett's genetic test results and recommendations warranted by these results were recently disclosed to her and are discussed in more detail below. ° °GENETIC TEST RESULTS: At the time of Carla Bennett's visit, we recommended she pursue genetic testing, which includes sequencing and deletion/duplication analysis of several genes associated with an increased risk for cancer via a gene panel. The OvaNext gene panel offered by [facility] includes sequencing and rearrangement analysis for the following 24 genes:ATM, BARD1, BRCA1, BRCA2, BRIP1, CDH1, CHEK2, EPCAM, MLH1, MRE11A, MSH2, MSH6, MUTYH, NBN, NF1, PALB2, PMS2, PTEN, RAD50, RAD51C, RAD51D, SMARCA4, STK11, and TP53. Genetic testing for this gene panel was normal and did not reveal a pathogenic mutation in any of these genes. A copy of the genetic test report will be scanned into Epic under the media tab. Genetic testing did identify a variant of uncertain significance called PMS2, p.V717M. At this time, it is unknown if this variant is associated with an increased risk for cancer or if this is a normal finding. With time, we suspect the lab will reclassify this variant and when they do, we will try  to re-contact Carla Bennett to discuss the reclassification further.  ° °We discussed with Carla Bennett that current genetic testing is not perfect, and it is, therefore, possible there may be a pathogenic gene mutation in one of these genes that current testing cannot detect, but that chance is small.  We also discussed, that it is possible that another gene that has not yet been discovered, or that we have not yet tested, is responsible for the cancer diagnoses in her family. It is, therefore, important for Carla Bennett to continue to remain in touch with cancer genetics so that we can continue to offer Carla Bennett the most up to date genetic testing.  ° °CANCER SCREENING RECOMMENDATIONS: This result is reassuring and indicates that Carla Bennett likely does not have an increased risk for a future cancer due to a mutation in one of these genes. This normal test also suggests that Carla Bennett's cancer was most likely not due to an inherited predisposition associated with one of these genes.  Most cancers happen by chance and this negative test suggests that her cancer falls into this category.  We, therefore, recommended she continue to follow the cancer management and screening guidelines provided by her oncology and primary healthcare providers.  ° °RECOMMENDATIONS FOR FAMILY MEMBERS:   Individuals in this family might be at some increased risk of developing cancer, over the general population risk, simply due to the family history of cancer.  We recommended women in this family have a yearly mammogram beginning at age 40, an annual clinical breast exam, and perform monthly breast self-exams. Women in this family should also have a gynecological exam as recommended by their primary provider. All family members should have a colonoscopy by age   50, an annual dermatological exam and continue to follow cancer screening guidelines recommended by their healthcare provider. ° °FOLLOW-UP: Lastly, we discussed with Carla Bennett that  cancer genetics is a rapidly advancing field and it is likely that new genetic tests will be appropriate for her and/or family members in the future. We encouraged her to remain in contact with cancer genetics on an annual basis so we can update her personal and family histories and let her know of advances in cancer genetics that may benefit this family.  ° °Our contact number was provided. Ms. Pindell's questions were answered to her satisfaction, and she knows she is welcome to call us at anytime with additional questions or concerns.  ° ° °Carla A. Fine, MS, CGC °Certified Genetic Counselor [email or web address] °Phone: [phone] ° ° ° °

## 2015-04-15 ENCOUNTER — Emergency Department (HOSPITAL_COMMUNITY)
Admission: EM | Admit: 2015-04-15 | Discharge: 2015-04-15 | Disposition: A | Discharge status: Home / Self Care | Payer: Medicare Other | Attending: Emergency Medicine | Admitting: Emergency Medicine

## 2015-04-15 ENCOUNTER — Encounter (HOSPITAL_COMMUNITY): Payer: Self-pay | Admitting: Emergency Medicine

## 2015-04-15 DIAGNOSIS — Z86018 Personal history of other benign neoplasm: Secondary | ICD-10-CM | POA: Diagnosis not present

## 2015-04-15 DIAGNOSIS — Z853 Personal history of malignant neoplasm of breast: Secondary | ICD-10-CM | POA: Diagnosis not present

## 2015-04-15 DIAGNOSIS — R0981 Nasal congestion: Secondary | ICD-10-CM | POA: Insufficient documentation

## 2015-04-15 DIAGNOSIS — R05 Cough: Secondary | ICD-10-CM | POA: Insufficient documentation

## 2015-04-15 DIAGNOSIS — M1990 Unspecified osteoarthritis, unspecified site: Secondary | ICD-10-CM | POA: Diagnosis not present

## 2015-04-15 DIAGNOSIS — R0982 Postnasal drip: Secondary | ICD-10-CM | POA: Diagnosis not present

## 2015-04-15 DIAGNOSIS — Z8349 Family history of other endocrine, nutritional and metabolic diseases: Secondary | ICD-10-CM | POA: Diagnosis not present

## 2015-04-15 DIAGNOSIS — Z8719 Personal history of other diseases of the digestive system: Secondary | ICD-10-CM | POA: Insufficient documentation

## 2015-04-15 DIAGNOSIS — J029 Acute pharyngitis, unspecified: Secondary | ICD-10-CM | POA: Diagnosis not present

## 2015-04-15 DIAGNOSIS — R590 Localized enlarged lymph nodes: Secondary | ICD-10-CM | POA: Diagnosis not present

## 2015-04-15 DIAGNOSIS — Z79899 Other long term (current) drug therapy: Secondary | ICD-10-CM | POA: Insufficient documentation

## 2015-04-15 DIAGNOSIS — Z87448 Personal history of other diseases of urinary system: Secondary | ICD-10-CM | POA: Insufficient documentation

## 2015-04-15 DIAGNOSIS — Z8742 Personal history of other diseases of the female genital tract: Secondary | ICD-10-CM | POA: Diagnosis not present

## 2015-04-15 DIAGNOSIS — R011 Cardiac murmur, unspecified: Secondary | ICD-10-CM | POA: Insufficient documentation

## 2015-04-15 DIAGNOSIS — Z85828 Personal history of other malignant neoplasm of skin: Secondary | ICD-10-CM | POA: Insufficient documentation

## 2015-04-15 DIAGNOSIS — J028 Acute pharyngitis due to other specified organisms: Secondary | ICD-10-CM | POA: Diagnosis not present

## 2015-04-15 DIAGNOSIS — I10 Essential (primary) hypertension: Secondary | ICD-10-CM | POA: Diagnosis not present

## 2015-04-15 LAB — CBC WITH DIFFERENTIAL/PLATELET
BASOS ABS: 0.1 10*3/uL (ref 0.0–0.1)
Basophils Relative: 1 % (ref 0–1)
EOS PCT: 1 % (ref 0–5)
Eosinophils Absolute: 0.1 10*3/uL (ref 0.0–0.7)
HCT: 37.2 % (ref 36.0–46.0)
Hemoglobin: 12.2 g/dL (ref 12.0–15.0)
Lymphocytes Relative: 14 % (ref 12–46)
Lymphs Abs: 1.2 10*3/uL (ref 0.7–4.0)
MCH: 28.4 pg (ref 26.0–34.0)
MCHC: 32.8 g/dL (ref 30.0–36.0)
MCV: 86.5 fL (ref 78.0–100.0)
MONO ABS: 1.1 10*3/uL — AB (ref 0.1–1.0)
MONOS PCT: 13 % — AB (ref 3–12)
NEUTROS ABS: 5.8 10*3/uL (ref 1.7–7.7)
Neutrophils Relative %: 71 % (ref 43–77)
Platelets: 244 10*3/uL (ref 150–400)
RBC: 4.3 MIL/uL (ref 3.87–5.11)
RDW: 13.6 % (ref 11.5–15.5)
WBC: 8.3 10*3/uL (ref 4.0–10.5)

## 2015-04-15 LAB — BASIC METABOLIC PANEL
Anion gap: 8 (ref 5–15)
BUN: 10 mg/dL (ref 6–20)
CO2: 26 mmol/L (ref 22–32)
CREATININE: 1.18 mg/dL — AB (ref 0.44–1.00)
Calcium: 10.1 mg/dL (ref 8.9–10.3)
Chloride: 100 mmol/L — ABNORMAL LOW (ref 101–111)
GFR calc Af Amer: 50 mL/min — ABNORMAL LOW (ref >60)
GFR calc non Af Amer: 43 mL/min — ABNORMAL LOW (ref >60)
Glucose, Bld: 105 mg/dL — ABNORMAL HIGH (ref 65–99)
POTASSIUM: 3.7 mmol/L (ref 3.5–5.1)
SODIUM: 134 mmol/L — AB (ref 135–145)

## 2015-04-15 LAB — RAPID STREP SCREEN (MED CTR MEBANE ONLY): STREPTOCOCCUS, GROUP A SCREEN (DIRECT): NEGATIVE

## 2015-04-15 MED ORDER — GI COCKTAIL ~~LOC~~
30.0000 mL | Freq: Once | ORAL | Status: AC
  Administered 2015-04-15: 30 mL via ORAL
  Filled 2015-04-15: qty 30

## 2015-04-15 NOTE — ED Notes (Signed)
Pt states that she has had a sore throat since last night and feels weak today. Breast CA. Last tx 6/26. Alert and oriented. Afebrile now.

## 2015-04-15 NOTE — ED Provider Notes (Signed)
CSN: [id number]     Arrival date & time 04/15/15  1516 History   First MD Initiated Contact with Patient 04/15/15 1543     Chief Complaint  Patient presents with  . Chemo Card   . Sore Throat     (Consider location/radiation/quality/duration/timing/severity/associated sxs/prior Treatment) Patient is a 80 y.o. female presenting with pharyngitis. The history is provided by the patient.  Sore Throat This is a new problem. The current episode started 12 to 24 hours ago. The problem occurs constantly. The problem has not changed since onset.Pertinent negatives include no chest pain, no abdominal pain, no headaches and no shortness of breath. The symptoms are aggravated by swallowing. Nothing relieves the symptoms. She has tried nothing for the symptoms. The treatment provided no relief.    Past Medical History  Diagnosis Date  . Diverticulosis of colon (without mention of hemorrhage)   . Benign neoplasm of colon   . Diverticulitis   . Arthritis   . Gout   . Hypertension   . Allergy   . Other issue of medical certificates     uterine myomata  . Hematuria     negative urology work up  . Hx of abnormal cervical Pap smear     LEEP-CIN I, negative margins and ECC  . Uterine prolaps   . Cystocele   . Basal cell carcinoma   . Hypercalcemia   . Cholelithiasis   . History of blood transfusion     x 1 with D&C  . Renal disease 3/16    Stage 3 kidney disease-seeing Dr Buddy Duty and Dr Marval Regal  . Breast cancer of upper-outer quadrant of left female breast 02/23/2015  . Heart murmur   . Parathyroid abnormality    Past Surgical History  Procedure Laterality Date  . Cystocele repair  08/2010    Rectocele, vault prolapse  . Dilation and curettage of uterus      x2  . Tonsillectomy and adenoidectomy    . Cholecystectomy    . Total vaginal hysterectomy      secondary to prolapse, ovaries not removed  . Portacath placement Right 03/20/2015    Procedure: INSERTION PORT-A-CATH;  Surgeon:  Erroll Luna, MD;  Location: [facility] OR;  Service: General;  Laterality: Right;   Family History  Problem Relation Age of Onset  . Breast cancer Sister 47  . Lung cancer Father 34  . Parkinson's disease Mother   . Alzheimer's disease Mother     ? diag  . Lung cancer Mother   . Multiple sclerosis Sister   . Colon cancer Neg Hx   . Esophageal cancer Neg Hx   . Rectal cancer Neg Hx   . Stomach cancer Neg Hx   . Prostate cancer Maternal Uncle 63  . Cancer Maternal Uncle     stomache  . Cancer Maternal Uncle 49    GI cancer - ? stomach  . Cancer Cousin 5    mat female first cousin (son of mat uncle with GI cancer) with ureter cancer   History  Substance Use Topics  . Smoking status: Never Smoker   . Smokeless tobacco: Never Used  . Alcohol Use: Yes     Comment: occasionally   OB History    Gravida Para Term Preterm AB TAB SAB Ectopic Multiple Living   6 4   2  2   4      Review of Systems  Constitutional: Negative for fever and fatigue.  HENT: Positive for congestion, postnasal drip, rhinorrhea  and sore throat. Negative for drooling.   Eyes: Negative for pain.  Respiratory: Positive for cough (very mild). Negative for shortness of breath.   Cardiovascular: Negative for chest pain.  Gastrointestinal: Negative for nausea, vomiting, abdominal pain and diarrhea.  Genitourinary: Negative for dysuria and hematuria.  Musculoskeletal: Negative for back pain, gait problem and neck pain.  Skin: Negative for color change.  Neurological: Negative for dizziness and headaches.  Hematological: Negative for adenopathy.  Psychiatric/Behavioral: Negative for behavioral problems.  All other systems reviewed and are negative.     Allergies  Review of patient's allergies indicates no known allergies.  Home Medications   Prior to Admission medications   Medication Sig Start Date End Date Taking? Authorizing Provider  B Complex Vitamins (VITAMIN B COMPLEX PO) Take 1 tablet by mouth daily.     Yes Historical Provider, MD  dexamethasone (DECADRON) 4 MG tablet Take 1 tablet (4 mg total) by mouth 2 (two) times daily. Take 2 tablets by mouth once a day on the day after chemotherapy and then take 2 tablets two times a day for 2 days. Take with food. 03/07/15  Yes Nicholas Lose, MD  furosemide (LASIX) 20 MG tablet Take 20 mg by mouth daily.  01/24/15  Yes Historical Provider, MD  labetalol (NORMODYNE) 200 MG tablet Take 200 mg by mouth 2 (two) times daily. 01/11/15  Yes Historical Provider, MD  lidocaine-prilocaine (EMLA) cream Apply to port-a-cath 1-2 hours prior to access. 03/09/15  Yes Nicholas Lose, MD  lisinopril (PRINIVIL,ZESTRIL) 40 MG tablet Take 40 mg by mouth daily.  09/01/13  Yes Historical Provider, MD  loperamide (IMODIUM A-D) 2 MG tablet Take 2 mg by mouth 4 (four) times daily as needed for diarrhea or loose stools (diarrhea).   Yes Historical Provider, MD  loratadine (CLARITIN) 10 MG tablet Take 10 mg by mouth daily.   Yes Historical Provider, MD  LORazepam (ATIVAN) 0.5 MG tablet Take 1 tablet (0.5 mg total) by mouth at bedtime. 03/07/15  Yes Nicholas Lose, MD  ondansetron (ZOFRAN) 8 MG tablet Take 1 tablet (8 mg total) by mouth 2 (two) times daily as needed. Start on the third day after chemotherapy. 03/07/15  Yes Nicholas Lose, MD  prochlorperazine (COMPAZINE) 10 MG tablet Take 1 tablet (10 mg total) by mouth every 6 (six) hours as needed (Nausea or vomiting). 03/07/15  Yes Nicholas Lose, MD  estradiol (ESTRACE) 0.5 MG tablet Take 1/2 tablet daily. Patient not taking: Reported on 03/30/2015 01/29/15   Megan Salon, MD  oxyCODONE-acetaminophen (ROXICET) 5-325 MG per tablet Take 1 tablet by mouth every 4 (four) hours as needed. Patient not taking: Reported on 04/15/2015 03/20/15   Erroll Luna, MD   BP 148/71 mmHg  Pulse 78  Temp(Src) 98.2 F (36.8 C) (Oral)  Resp 16  SpO2 99%  LMP 10/20/1996 Physical Exam  Constitutional: She is oriented to person, place, and time. She appears  well-developed and well-nourished.  HENT:  Head: Normocephalic.  Mouth/Throat: No oropharyngeal exudate.  Normal-appearing tympanic membranes bilaterally.  Mild cobblestoning of the posterior oropharynx. Normal appearing uvula without deviation. Otherwise normal appearing posterior oropharynx.  No trismus.  Eyes: Conjunctivae and EOM are normal. Pupils are equal, round, and reactive to light.  Neck: Normal range of motion. Neck supple.  Mild tenderness of the cervical lymph nodes in the right lateral neck.  Cardiovascular: Normal rate, regular rhythm, normal heart sounds and intact distal pulses.  Exam reveals no gallop and no friction rub.   No murmur  heard. Pulmonary/Chest: Effort normal and breath sounds normal. No respiratory distress. She has no wheezes.  Normal-appearing port in the right upper chest.  Abdominal: Soft. Bowel sounds are normal. There is no tenderness. There is no rebound and no guarding.  Musculoskeletal: Normal range of motion. She exhibits no edema or tenderness.  Neurological: She is alert and oriented to person, place, and time.  Skin: Skin is warm and dry.  Psychiatric: She has a normal mood and affect. Her behavior is normal.  Nursing note and vitals reviewed.   ED Course  Procedures (including critical care time) Labs Review Labs Reviewed  CBC WITH DIFFERENTIAL/PLATELET - Abnormal; Notable for the following:    Monocytes Relative 13 (*)    Monocytes Absolute 1.1 (*)    All other components within normal limits  BASIC METABOLIC PANEL - Abnormal; Notable for the following:    Sodium 134 (*)    Chloride 100 (*)    Glucose, Bld 105 (*)    Creatinine, Ser 1.18 (*)    GFR calc non Af Amer 43 (*)    GFR calc Af Amer 50 (*)    All other components within normal limits  RAPID STREP SCREEN (NOT AT [facility])  CULTURE, GROUP A STREP    Imaging Review No results found.   EKG Interpretation None      MDM   Final diagnoses:  Viral pharyngitis     4:32 PM 80 y.o. female w hx of breast ca on chemotherapy (started this month) who presents with nasal congestion and a sore throat which started last night. The phlegm from her nose is clear. She does note some postnasal drip. She has no difficulty swallowing but does note pain with swallowing. She has some mildly tender right lateral cervical lymphadenopathy. She denies any fevers. Vital signs unremarkable here. Screening lab work sent prior to my evaluation. Will swab for strep given immunosuppression but low suspicion for this. No trismus. Normal range of motion of the neck. Normal appearing posterior oropharynx with exception of some mild cobblestoning consistent with postnasal drip. Likely viral pharyngitis.  5:43 PM: Strep neg. I interpreted/reviewed the labs and/or imaging which were non-contributory.  I have discussed the diagnosis/risks/treatment options with the patient and believe the pt to be eligible for discharge home to follow-up with her pcp as needed. We also discussed returning to the ED immediately if new or worsening sx occur. We discussed the sx which are most concerning (e.g., trismus, worsening pain, difficulty swallowing, neck swelling, fever) that necessitate immediate return. Medications administered to the patient during their visit and any new prescriptions provided to the patient are listed below.  Medications given during this visit Medications  gi cocktail (Maalox,Lidocaine,Donnatal) (30 mLs Oral Given 04/15/15 1627)    New Prescriptions   No medications on file     Pamella Pert, MD 04/15/15 1745

## 2015-04-16 ENCOUNTER — Telehealth: Payer: Self-pay

## 2015-04-16 ENCOUNTER — Other Ambulatory Visit: Payer: Self-pay

## 2015-04-16 NOTE — Telephone Encounter (Signed)
Call report rcvd from [place] 6/26 2:31 pm.  Sent to scan.

## 2015-04-18 DIAGNOSIS — I10 Essential (primary) hypertension: Secondary | ICD-10-CM | POA: Diagnosis not present

## 2015-04-18 DIAGNOSIS — N183 Chronic kidney disease, stage 3 (moderate): Secondary | ICD-10-CM | POA: Diagnosis not present

## 2015-04-18 DIAGNOSIS — N2581 Secondary hyperparathyroidism of renal origin: Secondary | ICD-10-CM | POA: Diagnosis not present

## 2015-04-18 LAB — CULTURE, GROUP A STREP: Strep A Culture: NEGATIVE

## 2015-04-19 ENCOUNTER — Other Ambulatory Visit: Payer: Medicare Other

## 2015-04-19 ENCOUNTER — Ambulatory Visit (HOSPITAL_BASED_OUTPATIENT_CLINIC_OR_DEPARTMENT_OTHER): Payer: Medicare Other | Admitting: Physician Assistant

## 2015-04-19 ENCOUNTER — Encounter: Payer: Self-pay | Admitting: Physician Assistant

## 2015-04-19 ENCOUNTER — Other Ambulatory Visit (HOSPITAL_BASED_OUTPATIENT_CLINIC_OR_DEPARTMENT_OTHER): Payer: Medicare Other

## 2015-04-19 ENCOUNTER — Ambulatory Visit: Payer: Medicare Other

## 2015-04-19 ENCOUNTER — Ambulatory Visit (HOSPITAL_BASED_OUTPATIENT_CLINIC_OR_DEPARTMENT_OTHER): Payer: Medicare Other

## 2015-04-19 VITALS — BP 139/79 | HR 84 | Temp 98.4°F | Resp 18 | Ht 61.0 in | Wt 144.0 lb

## 2015-04-19 DIAGNOSIS — Z5111 Encounter for antineoplastic chemotherapy: Secondary | ICD-10-CM

## 2015-04-19 DIAGNOSIS — Z95828 Presence of other vascular implants and grafts: Secondary | ICD-10-CM

## 2015-04-19 DIAGNOSIS — Z5189 Encounter for other specified aftercare: Secondary | ICD-10-CM | POA: Diagnosis not present

## 2015-04-19 DIAGNOSIS — C50412 Malignant neoplasm of upper-outer quadrant of left female breast: Secondary | ICD-10-CM

## 2015-04-19 DIAGNOSIS — C773 Secondary and unspecified malignant neoplasm of axilla and upper limb lymph nodes: Secondary | ICD-10-CM

## 2015-04-19 LAB — CBC WITH DIFFERENTIAL/PLATELET
BASO%: 0.7 % (ref 0.0–2.0)
Basophils Absolute: 0.1 10*3/uL (ref 0.0–0.1)
EOS%: 0.1 % (ref 0.0–7.0)
Eosinophils Absolute: 0 10*3/uL (ref 0.0–0.5)
HCT: 35.7 % (ref 34.8–46.6)
HEMOGLOBIN: 11.8 g/dL (ref 11.6–15.9)
LYMPH%: 4.9 % — AB (ref 14.0–49.7)
MCH: 27.9 pg (ref 25.1–34.0)
MCHC: 33 g/dL (ref 31.5–36.0)
MCV: 84.7 fL (ref 79.5–101.0)
MONO#: 1 10*3/uL — ABNORMAL HIGH (ref 0.1–0.9)
MONO%: 5 % (ref 0.0–14.0)
NEUT#: 17.3 10*3/uL — ABNORMAL HIGH (ref 1.5–6.5)
NEUT%: 89.3 % — AB (ref 38.4–76.8)
Platelets: 378 10*3/uL (ref 145–400)
RBC: 4.21 10*6/uL (ref 3.70–5.45)
RDW: 13.6 % (ref 11.2–14.5)
WBC: 19.3 10*3/uL — ABNORMAL HIGH (ref 3.9–10.3)
lymph#: 0.9 10*3/uL (ref 0.9–3.3)

## 2015-04-19 LAB — COMPREHENSIVE METABOLIC PANEL (CC13)
ALT: 15 U/L (ref 0–55)
AST: 15 U/L (ref 5–34)
Albumin: 3.4 g/dL — ABNORMAL LOW (ref 3.5–5.0)
Alkaline Phosphatase: 95 U/L (ref 40–150)
Anion Gap: 10 mEq/L (ref 3–11)
BILIRUBIN TOTAL: 0.62 mg/dL (ref 0.20–1.20)
BUN: 11.5 mg/dL (ref 7.0–26.0)
CO2: 25 mEq/L (ref 22–29)
Calcium: 10.8 mg/dL — ABNORMAL HIGH (ref 8.4–10.4)
Chloride: 103 mEq/L (ref 98–109)
Creatinine: 1 mg/dL (ref 0.6–1.1)
EGFR: 52 mL/min/{1.73_m2} — ABNORMAL LOW (ref >90)
GLUCOSE: 110 mg/dL (ref 70–140)
Potassium: 3.8 mEq/L (ref 3.5–5.1)
Sodium: 139 mEq/L (ref 136–145)
Total Protein: 6.8 g/dL (ref 6.4–8.3)

## 2015-04-19 MED ORDER — SODIUM CHLORIDE 0.9 % IJ SOLN
10.0000 mL | INTRAMUSCULAR | Status: DC | PRN
  Administered 2015-04-19: 10 mL via INTRAVENOUS
  Filled 2015-04-19: qty 10

## 2015-04-19 MED ORDER — SODIUM CHLORIDE 0.9 % IV SOLN
Freq: Once | INTRAVENOUS | Status: AC
  Administered 2015-04-19: 13:00:00 via INTRAVENOUS

## 2015-04-19 MED ORDER — PALONOSETRON HCL INJECTION 0.25 MG/5ML
INTRAVENOUS | Status: AC
  Filled 2015-04-19: qty 5

## 2015-04-19 MED ORDER — PALONOSETRON HCL INJECTION 0.25 MG/5ML
0.2500 mg | Freq: Once | INTRAVENOUS | Status: AC
  Administered 2015-04-19: 0.25 mg via INTRAVENOUS

## 2015-04-19 MED ORDER — SODIUM CHLORIDE 0.9 % IV SOLN
Freq: Once | INTRAVENOUS | Status: AC
  Administered 2015-04-19: 13:00:00 via INTRAVENOUS
  Filled 2015-04-19: qty 5

## 2015-04-19 MED ORDER — DOXORUBICIN HCL CHEMO IV INJECTION 2 MG/ML
50.0000 mg/m2 | Freq: Once | INTRAVENOUS | Status: AC
  Administered 2015-04-19: 86 mg via INTRAVENOUS
  Filled 2015-04-19: qty 43

## 2015-04-19 MED ORDER — PEGFILGRASTIM 6 MG/0.6ML ~~LOC~~ PSKT
6.0000 mg | PREFILLED_SYRINGE | Freq: Once | SUBCUTANEOUS | Status: AC
  Administered 2015-04-19: 6 mg via SUBCUTANEOUS
  Filled 2015-04-19: qty 0.6

## 2015-04-19 MED ORDER — HEPARIN SOD (PORK) LOCK FLUSH 100 UNIT/ML IV SOLN
500.0000 [IU] | Freq: Once | INTRAVENOUS | Status: AC | PRN
  Administered 2015-04-19: 500 [IU]
  Filled 2015-04-19: qty 5

## 2015-04-19 MED ORDER — SODIUM CHLORIDE 0.9 % IJ SOLN
10.0000 mL | INTRAMUSCULAR | Status: DC | PRN
  Administered 2015-04-19: 10 mL
  Filled 2015-04-19: qty 10

## 2015-04-19 MED ORDER — SODIUM CHLORIDE 0.9 % IV SOLN
500.0000 mg/m2 | Freq: Once | INTRAVENOUS | Status: AC
  Administered 2015-04-19: 860 mg via INTRAVENOUS
  Filled 2015-04-19: qty 43

## 2015-04-19 NOTE — Patient Instructions (Signed)

## 2015-04-19 NOTE — Progress Notes (Signed)
Discharged at 1425 alone, ambulatory in no distress.  Friend waiting in lobby.

## 2015-04-19 NOTE — Patient Instructions (Signed)
[name] Discharge Instructions for Patients Receiving Chemotherapy  Today you received the following chemotherapy agents Adriamycin, Cytoxan.  To help prevent nausea and vomiting after your treatment, we encourage you to take your nausea medication compazine, zofran as ordered by Dr. Lindi Adie.   If you develop nausea and vomiting that is not controlled by your nausea medication, call the clinic.   BELOW ARE SYMPTOMS THAT SHOULD BE REPORTED IMMEDIATELY:  *FEVER GREATER THAN 100.5 F  *CHILLS WITH OR WITHOUT FEVER  NAUSEA AND VOMITING THAT IS NOT CONTROLLED WITH YOUR NAUSEA MEDICATION  *UNUSUAL SHORTNESS OF BREATH  *UNUSUAL BRUISING OR BLEEDING  TENDERNESS IN MOUTH AND THROAT WITH OR WITHOUT PRESENCE OF ULCERS  *URINARY PROBLEMS  *BOWEL PROBLEMS  UNUSUAL RASH Items with * indicate a potential emergency and should be followed up as soon as possible.  Feel free to call the clinic you have any questions or concerns. The clinic phone number is [phone].  Please show the [place] at check-in to the Emergency Department and triage nurse.

## 2015-04-19 NOTE — Progress Notes (Signed)
Patient Care Team: Thressa Sheller, MD as PCP - General (Internal Medicine) Erroll Luna, MD as Consulting Physician (General Surgery) Nicholas Lose, MD as Consulting Physician (Hematology and Oncology) Gery Pray, MD as Consulting Physician (Radiation Oncology) Mauro Kaufmann, RN as Registered Nurse Rockwell Germany, RN as Registered Nurse  DIAGNOSIS: Breast cancer of upper-outer quadrant of left female breast   Staging form: Breast, AJCC 7th Edition     Clinical stage from 03/07/2015: Stage IIB (T2, N1, M0) - Unsigned       Staging comments: Staged at breast conference on 5.18.16    SUMMARY OF ONCOLOGIC HISTORY:   Breast cancer of upper-outer quadrant of left female breast   02/15/2015 Mammogram Left breast increased density, mass is irregular with microcalcifications measuring 2.9 cm, cyst in the breast as well as abnormal enlarged lymph nodes   02/20/2015 Initial Diagnosis Left breast biopsy: Invasive ductal carcinoma with DCIS, axillary lymph node biopsy positive, ER 0%, PR 0%, HER-2 negative ratio 1.13 with Ki-67 75%   03/15/2015 PET scan 2.8 cm solid irregular left breast mass which is hypermetabolic and consistent with known left breast cancer. 2. Weakly positive axillary lymph nodes or equivocal   03/20/2015 Breast MRI 3.1 cm irregular enhancing mass located within the left breast at the 1 o'clock position; 1.6 cm Left axillary LN   03/22/2015 -  Neo-Adjuvant Chemotherapy Neo-adjuvant chemotherapy with dose dense Adriamycin and Cytoxan 4 followed by Abraxane weekly 12    CHIEF COMPLIANT: Cycle 3 day 1 dose dense Adriamycin and Cytoxan.   INTERVAL HISTORY: Carla Bennett is a above-mentioned history of left breast cancer. She presents today prior to cycle 3 of neo-adjuvant chemotherapy with dose dense Adriamycin and Cytoxan.  She has triple negative disease with positive axillary lymph node. She underwent port placement and echocardiogram showed an ejection fraction of 55-60%.  Overall she she is feeling well today except for a sore throat. She states the Sunday her throat was very sore and she was evaluated in the emergency room. She was told that was likely viral and has been using Chloraseptic spray. She's not been able to eat terribly well due to the sore throat and her weight is down approximately 4.8 pounds since 04/05/2015. Her sleep is also disturbed because of the sore throat. It is gradually feeling better and the Chloraseptic spray seems to be helpful. She has mild fatigue but denies fevers, chest pain, shortness of breath, abdominal pain, nausea, vomiting, and diarrhea. The patient is ready to proceed with cycle 3 for chemotherapy today as scheduled.   REVIEW OF SYSTEMS:   Constitutional: Denies fevers, chills or abnormal weight loss Eyes: Denies blurriness of vision Ears, nose, mouth, throat, and face: Denies mucositis or sore throat Respiratory: Denies cough, dyspnea or wheezes Cardiovascular: Denies palpitation, chest discomfort or lower extremity swelling Gastrointestinal:  Denies nausea, heartburn or change in bowel habits Skin: Denies abnormal skin rashes Lymphatics: Denies new lymphadenopathy or easy bruising Neurological:Denies numbness, tingling or new weaknesses Behavioral/Psych: Mood is stable, no new changes  All other systems were reviewed with the patient and are negative.  I have reviewed the past medical history, past surgical history, social history and family history with the patient and they are unchanged from previous note.  ALLERGIES:  has No Known Allergies.  MEDICATIONS:  Current Outpatient Prescriptions  Medication Sig Dispense Refill  . B Complex Vitamins (VITAMIN B COMPLEX PO) Take 1 tablet by mouth daily.     Marland Kitchen dexamethasone (DECADRON) 4 MG tablet  Take 1 tablet (4 mg total) by mouth 2 (two) times daily. Take 2 tablets by mouth once a day on the day after chemotherapy and then take 2 tablets two times a day for 2 days. Take with  food. 30 tablet 1  . estradiol (ESTRACE) 0.5 MG tablet Take 1/2 tablet daily. (Patient not taking: Reported on 03/30/2015) 45 tablet 4  . furosemide (LASIX) 20 MG tablet Take 20 mg by mouth daily.     Marland Kitchen labetalol (NORMODYNE) 200 MG tablet Take 200 mg by mouth 2 (two) times daily.  6  . lidocaine-prilocaine (EMLA) cream Apply to port-a-cath 1-2 hours prior to access. 30 g 3  . lisinopril (PRINIVIL,ZESTRIL) 40 MG tablet Take 40 mg by mouth daily.     Marland Kitchen loperamide (IMODIUM A-D) 2 MG tablet Take 2 mg by mouth 4 (four) times daily as needed for diarrhea or loose stools (diarrhea).    . loratadine (CLARITIN) 10 MG tablet Take 10 mg by mouth daily.    Marland Kitchen LORazepam (ATIVAN) 0.5 MG tablet Take 1 tablet (0.5 mg total) by mouth at bedtime. 30 tablet 0  . ondansetron (ZOFRAN) 8 MG tablet Take 1 tablet (8 mg total) by mouth 2 (two) times daily as needed. Start on the third day after chemotherapy. 30 tablet 1  . oxyCODONE-acetaminophen (ROXICET) 5-325 MG per tablet Take 1 tablet by mouth every 4 (four) hours as needed. (Patient not taking: Reported on 04/15/2015) 30 tablet 0  . prochlorperazine (COMPAZINE) 10 MG tablet Take 1 tablet (10 mg total) by mouth every 6 (six) hours as needed (Nausea or vomiting). 30 tablet 1   No current facility-administered medications for this visit.   Facility-Administered Medications Ordered in Other Visits  Medication Dose Route Frequency Provider Last Rate Last Dose  . sodium chloride 0.9 % injection 10 mL  10 mL Intravenous PRN Nicholas Lose, MD   10 mL at 04/19/15 1025  . sodium chloride 0.9 % injection 10 mL  10 mL Intracatheter PRN Nicholas Lose, MD   10 mL at 04/19/15 1419    PHYSICAL EXAMINATION: ECOG PERFORMANCE STATUS: 1 - Symptomatic but completely ambulatory  Filed Vitals:   04/19/15 1048  BP: 139/79  Pulse: 84  Temp: 98.4 F (36.9 C)  Resp: 18   Filed Weights   04/19/15 1048  Weight: 144 lb (65.318 kg)    GENERAL:alert, no distress and comfortable SKIN:  skin color, texture, turgor are normal, no rashes or significant lesions EYES: normal, Conjunctiva are pink and non-injected, sclera clear OROPHARYNX:no exudate, no erythema and lips, buccal mucosa, and tongue normal  NECK: supple, thyroid normal size, non-tender, without nodularity LYMPH:  no palpable lymphadenopathy in the cervical, axillary or inguinal LUNGS: clear to auscultation and percussion with normal breathing effort HEART: regular rate & rhythm and no murmurs and no lower extremity edema ABDOMEN:abdomen soft, non-tender and normal bowel sounds Musculoskeletal:no cyanosis of digits and no clubbing  NEURO: alert & oriented x 3 with fluent speech, no focal motor/sensory deficits Right anterior chest Port-A-Cath is in place with well-healing incisions and resolving ecchymosis  LABORATORY DATA:  I have reviewed the data as listed   Chemistry      Component Value Date/Time   NA 139 04/19/2015 0947   NA 134* 04/15/2015 1556   K 3.8 04/19/2015 0947   K 3.7 04/15/2015 1556   CL 100* 04/15/2015 1556   CO2 25 04/19/2015 0947   CO2 26 04/15/2015 1556   BUN 11.5 04/19/2015 0947  BUN 10 04/15/2015 1556   CREATININE 1.0 04/19/2015 0947   CREATININE 1.18* 04/15/2015 1556      Component Value Date/Time   CALCIUM 10.8* 04/19/2015 0947   CALCIUM 10.1 04/15/2015 1556   ALKPHOS 95 04/19/2015 0947   ALKPHOS 69 03/16/2015 1133   AST 15 04/19/2015 0947   AST 22 03/16/2015 1133   ALT 15 04/19/2015 0947   ALT 18 03/16/2015 1133   BILITOT 0.62 04/19/2015 0947   BILITOT 0.6 03/16/2015 1133       Lab Results  Component Value Date   WBC 19.3* 04/19/2015   HGB 11.8 04/19/2015   HCT 35.7 04/19/2015   MCV 84.7 04/19/2015   PLT 378 04/19/2015   NEUTROABS 17.3* 04/19/2015   ASSESSMENT & PLAN:  Breast cancer of upper-outer quadrant of left female breast Left breast invasive ductal carcinoma grade 3 ER 0% PR 0% HER-2 negative Ki-67 75%, 2.9 cm mass with microcalcifications plus abnormal  lymph nodes biopsy-proven to be breast cancer T2 N1 M0 stage IIB clinical stage PET-CT: Neg Breast MRI: 3.1 cm left breast mass, 1.6 cm Left Axillary LN  Treatment Plan:  1. Neoadjuvant chemotherapy: Dose dense Adriamycin and Cytoxan 4 followed by Abraxane weekly 12 2. Consideration for [facility] clinical trial for surgery 3. Followed by adjuvant radiation  Current Treatment: Dose Dense AC X 4 followed by weekly Abraxane X 12; Today is cycle 3 day 1 (given her advanced age, we are starting her chemotherapy and 50 mg/m2 doxorubicin and 500 mg/m2 Cytoxan)  Mild chronic kidney disease: Creatinine has improved to 1.1. She is again encouraged to stay well hydrated.   Goal of treatment: Cure RTC 2 weeks for reevaluation prior to the start of cycle #4  No orders of the defined types were placed in this encounter.  The patient has a good understanding of the overall plan. she agrees with it. she will call with any problems that may develop before the next visit here.   Carlton Adam, PA-C  04/19/2015

## 2015-04-20 ENCOUNTER — Other Ambulatory Visit: Payer: Self-pay | Admitting: *Deleted

## 2015-04-20 ENCOUNTER — Telehealth: Payer: Self-pay

## 2015-04-20 ENCOUNTER — Encounter: Payer: Self-pay | Admitting: *Deleted

## 2015-04-20 DIAGNOSIS — C50412 Malignant neoplasm of upper-outer quadrant of left female breast: Secondary | ICD-10-CM

## 2015-04-20 MED ORDER — DEXAMETHASONE 4 MG PO TABS: 4.0000 mg | ORAL_TABLET | Freq: Two times a day (BID) | ORAL | Status: DC

## 2015-04-20 NOTE — Telephone Encounter (Signed)
Received office notes from [facility] 04/18/15.  Reviewed by Dr. Lindi Adie. Sent to scan.    Received 2 call reports from [facility].  Sent to Scan.

## 2015-04-23 NOTE — Patient Instructions (Signed)
Continue labs and chemotherapy as scheduled  Follow up in 2 weeks 

## 2015-05-03 ENCOUNTER — Ambulatory Visit: Payer: Medicare Other

## 2015-05-03 ENCOUNTER — Other Ambulatory Visit (HOSPITAL_BASED_OUTPATIENT_CLINIC_OR_DEPARTMENT_OTHER): Payer: Medicare Other

## 2015-05-03 ENCOUNTER — Other Ambulatory Visit: Payer: Medicare Other

## 2015-05-03 ENCOUNTER — Ambulatory Visit (HOSPITAL_BASED_OUTPATIENT_CLINIC_OR_DEPARTMENT_OTHER): Payer: Medicare Other | Admitting: Hematology and Oncology

## 2015-05-03 ENCOUNTER — Telehealth: Payer: Self-pay | Admitting: Hematology and Oncology

## 2015-05-03 ENCOUNTER — Encounter: Payer: Self-pay | Admitting: Hematology and Oncology

## 2015-05-03 ENCOUNTER — Ambulatory Visit (HOSPITAL_BASED_OUTPATIENT_CLINIC_OR_DEPARTMENT_OTHER): Payer: Medicare Other

## 2015-05-03 VITALS — BP 147/65 | HR 79 | Temp 98.4°F | Resp 18 | Ht 61.0 in | Wt 139.6 lb

## 2015-05-03 DIAGNOSIS — C50412 Malignant neoplasm of upper-outer quadrant of left female breast: Secondary | ICD-10-CM | POA: Diagnosis not present

## 2015-05-03 DIAGNOSIS — Z5111 Encounter for antineoplastic chemotherapy: Secondary | ICD-10-CM

## 2015-05-03 DIAGNOSIS — R5383 Other fatigue: Secondary | ICD-10-CM | POA: Diagnosis not present

## 2015-05-03 DIAGNOSIS — L658 Other specified nonscarring hair loss: Secondary | ICD-10-CM

## 2015-05-03 DIAGNOSIS — Z5189 Encounter for other specified aftercare: Secondary | ICD-10-CM | POA: Diagnosis not present

## 2015-05-03 DIAGNOSIS — Z95828 Presence of other vascular implants and grafts: Secondary | ICD-10-CM

## 2015-05-03 DIAGNOSIS — Z171 Estrogen receptor negative status [ER-]: Secondary | ICD-10-CM | POA: Diagnosis not present

## 2015-05-03 DIAGNOSIS — D72829 Elevated white blood cell count, unspecified: Secondary | ICD-10-CM | POA: Diagnosis not present

## 2015-05-03 DIAGNOSIS — C773 Secondary and unspecified malignant neoplasm of axilla and upper limb lymph nodes: Secondary | ICD-10-CM

## 2015-05-03 DIAGNOSIS — D6481 Anemia due to antineoplastic chemotherapy: Secondary | ICD-10-CM | POA: Diagnosis not present

## 2015-05-03 LAB — CBC WITH DIFFERENTIAL/PLATELET
BASO%: 0.8 % (ref 0.0–2.0)
Basophils Absolute: 0.2 10*3/uL — ABNORMAL HIGH (ref 0.0–0.1)
EOS%: 0.1 % (ref 0.0–7.0)
Eosinophils Absolute: 0 10*3/uL (ref 0.0–0.5)
HCT: 31.7 % — ABNORMAL LOW (ref 34.8–46.6)
HGB: 10.5 g/dL — ABNORMAL LOW (ref 11.6–15.9)
LYMPH%: 6.2 % — ABNORMAL LOW (ref 14.0–49.7)
MCH: 28 pg (ref 25.1–34.0)
MCHC: 32.9 g/dL (ref 31.5–36.0)
MCV: 85.1 fL (ref 79.5–101.0)
MONO#: 1.3 10*3/uL — ABNORMAL HIGH (ref 0.1–0.9)
MONO%: 6.6 % (ref 0.0–14.0)
NEUT%: 86.3 % — ABNORMAL HIGH (ref 38.4–76.8)
NEUTROS ABS: 16.6 10*3/uL — AB (ref 1.5–6.5)
PLATELETS: 381 10*3/uL (ref 145–400)
RBC: 3.73 10*6/uL (ref 3.70–5.45)
RDW: 14.1 % (ref 11.2–14.5)
WBC: 19.2 10*3/uL — ABNORMAL HIGH (ref 3.9–10.3)
lymph#: 1.2 10*3/uL (ref 0.9–3.3)

## 2015-05-03 LAB — COMPREHENSIVE METABOLIC PANEL (CC13)
ALT: 13 U/L (ref 0–55)
AST: 18 U/L (ref 5–34)
Albumin: 3.6 g/dL (ref 3.5–5.0)
Alkaline Phosphatase: 104 U/L (ref 40–150)
Anion Gap: 10 mEq/L (ref 3–11)
BILIRUBIN TOTAL: 0.31 mg/dL (ref 0.20–1.20)
BUN: 10.4 mg/dL (ref 7.0–26.0)
CALCIUM: 10.3 mg/dL (ref 8.4–10.4)
CO2: 25 meq/L (ref 22–29)
Chloride: 105 mEq/L (ref 98–109)
Creatinine: 1 mg/dL (ref 0.6–1.1)
EGFR: 51 mL/min/{1.73_m2} — AB (ref >90)
Glucose: 86 mg/dl (ref 70–140)
POTASSIUM: 3.6 meq/L (ref 3.5–5.1)
SODIUM: 140 meq/L (ref 136–145)
TOTAL PROTEIN: 6.4 g/dL (ref 6.4–8.3)

## 2015-05-03 MED ORDER — HEPARIN SOD (PORK) LOCK FLUSH 100 UNIT/ML IV SOLN
500.0000 [IU] | Freq: Once | INTRAVENOUS | Status: AC | PRN
  Administered 2015-05-03: 500 [IU]
  Filled 2015-05-03: qty 5

## 2015-05-03 MED ORDER — PEGFILGRASTIM 6 MG/0.6ML ~~LOC~~ PSKT
6.0000 mg | PREFILLED_SYRINGE | Freq: Once | SUBCUTANEOUS | Status: AC
  Administered 2015-05-03: 6 mg via SUBCUTANEOUS
  Filled 2015-05-03: qty 0.6

## 2015-05-03 MED ORDER — SODIUM CHLORIDE 0.9 % IV SOLN
450.0000 mg/m2 | Freq: Once | INTRAVENOUS | Status: AC
  Administered 2015-05-03: 780 mg via INTRAVENOUS
  Filled 2015-05-03: qty 39

## 2015-05-03 MED ORDER — SODIUM CHLORIDE 0.9 % IV SOLN
Freq: Once | INTRAVENOUS | Status: AC
  Administered 2015-05-03: 15:00:00 via INTRAVENOUS
  Filled 2015-05-03: qty 5

## 2015-05-03 MED ORDER — DOXORUBICIN HCL CHEMO IV INJECTION 2 MG/ML
45.0000 mg/m2 | Freq: Once | INTRAVENOUS | Status: AC
Start: 2015-05-03 — End: 2015-05-03
  Administered 2015-05-03: 78 mg via INTRAVENOUS
  Filled 2015-05-03: qty 39

## 2015-05-03 MED ORDER — SODIUM CHLORIDE 0.9 % IV SOLN
Freq: Once | INTRAVENOUS | Status: AC
  Administered 2015-05-03: 15:00:00 via INTRAVENOUS

## 2015-05-03 MED ORDER — PALONOSETRON HCL INJECTION 0.25 MG/5ML
INTRAVENOUS | Status: AC
  Filled 2015-05-03: qty 5

## 2015-05-03 MED ORDER — PALONOSETRON HCL INJECTION 0.25 MG/5ML
0.2500 mg | Freq: Once | INTRAVENOUS | Status: AC
  Administered 2015-05-03: 0.25 mg via INTRAVENOUS

## 2015-05-03 MED ORDER — SODIUM CHLORIDE 0.9 % IJ SOLN
10.0000 mL | INTRAMUSCULAR | Status: DC | PRN
  Administered 2015-05-03: 10 mL via INTRAVENOUS
  Filled 2015-05-03: qty 10

## 2015-05-03 MED ORDER — MAGIC MOUTHWASH W/LIDOCAINE: 5.0000 mL | Freq: Three times a day (TID) | ORAL | Status: DC

## 2015-05-03 MED ORDER — SODIUM CHLORIDE 0.9 % IJ SOLN
10.0000 mL | INTRAMUSCULAR | Status: DC | PRN
  Administered 2015-05-03: 10 mL
  Filled 2015-05-03: qty 10

## 2015-05-03 NOTE — Telephone Encounter (Signed)
Appointments complete per 7/14 pof. Added labs to [facility] tx per desk nurse labs should be wkly w/tx - no frequency listed in current standing order. Patient aware of next appointment 05/17/15 and will be given avs report with copy of appointments while in infusion.

## 2015-05-03 NOTE — Progress Notes (Signed)
Patient Care Team: Thressa Sheller, MD as PCP - General (Internal Medicine) Erroll Luna, MD as Consulting Physician (General Surgery) Nicholas Lose, MD as Consulting Physician (Hematology and Oncology) Gery Pray, MD as Consulting Physician (Radiation Oncology) Mauro Kaufmann, RN as Registered Nurse Rockwell Germany, RN as Registered Nurse  DIAGNOSIS: Breast cancer of upper-outer quadrant of left female breast   Staging form: Breast, AJCC 7th Edition     Clinical stage from 03/07/2015: Stage IIB (T2, N1, M0) - Unsigned       Staging comments: Staged at breast conference on 5.18.16    SUMMARY OF ONCOLOGIC HISTORY:   Breast cancer of upper-outer quadrant of left female breast   02/15/2015 Mammogram Left breast increased density, mass is irregular with microcalcifications measuring 2.9 cm, cyst in the breast as well as abnormal enlarged lymph nodes   02/20/2015 Initial Diagnosis Left breast biopsy: Invasive ductal carcinoma with DCIS, axillary lymph node biopsy positive, ER 0%, PR 0%, HER-2 negative ratio 1.13 with Ki-67 75%   03/15/2015 PET scan 2.8 cm solid irregular left breast mass which is hypermetabolic and consistent with known left breast cancer. 2. Weakly positive axillary lymph nodes or equivocal   03/20/2015 Breast MRI 3.1 cm irregular enhancing mass located within the left breast at the 1 o'clock position; 1.6 cm Left axillary LN   03/22/2015 -  Neo-Adjuvant Chemotherapy Neo-adjuvant chemotherapy with dose dense Adriamycin and Cytoxan 4 followed by Abraxane weekly 12    CHIEF COMPLIANT:  Cycle 4 of dose dense Adriamycin and Cytoxan  INTERVAL HISTORY: Carla Bennett is a  80 year old with above-mentioned history of left breast cancer on neo-adjuvant chemotherapy with dose dense Adriamycin and Cytoxan. Today is cycle 4 of treatment. She has mouth sores since last 1 week. Her gums and also very sensitive. She has sometimes diarrhea 2 milk containing foods. Denies any nausea  vomiting.  REVIEW OF SYSTEMS:   Constitutional: Denies fevers, chills or abnormal weight loss Eyes: Denies blurriness of vision Ears, nose, mouth, throat, and face: Denies mucositis or sore throat Respiratory: Denies cough, dyspnea or wheezes Cardiovascular: Denies palpitation, chest discomfort or lower extremity swelling Gastrointestinal:  Denies nausea, heartburn or change in bowel habits Skin: Denies abnormal skin rashes Lymphatics: Denies new lymphadenopathy or easy bruising Neurological:Denies numbness, tingling or new weaknesses Behavioral/Psych: Mood is stable, no new changes   All other systems were reviewed with the patient and are negative.  I have reviewed the past medical history, past surgical history, social history and family history with the patient and they are unchanged from previous note.  ALLERGIES:  has No Known Allergies.  MEDICATIONS:  Current Outpatient Prescriptions  Medication Sig Dispense Refill  . B Complex Vitamins (VITAMIN B COMPLEX PO) Take 1 tablet by mouth daily.     Marland Kitchen dexamethasone (DECADRON) 4 MG tablet Take 1 tablet (4 mg total) by mouth 2 (two) times daily. Take 2 tablets by mouth once a day on the day after chemotherapy and then take 2 tablets two times a day for 2 days. Take with food. 30 tablet 1  . estradiol (ESTRACE) 0.5 MG tablet Take 1/2 tablet daily. (Patient not taking: Reported on 03/30/2015) 45 tablet 4  . furosemide (LASIX) 20 MG tablet Take 20 mg by mouth daily.     Marland Kitchen labetalol (NORMODYNE) 200 MG tablet Take 200 mg by mouth 2 (two) times daily.  6  . lidocaine-prilocaine (EMLA) cream Apply to port-a-cath 1-2 hours prior to access. 30 g 3  . lisinopril (  PRINIVIL,ZESTRIL) 40 MG tablet Take 40 mg by mouth daily.     Marland Kitchen loperamide (IMODIUM A-D) 2 MG tablet Take 2 mg by mouth 4 (four) times daily as needed for diarrhea or loose stools (diarrhea).    . loratadine (CLARITIN) 10 MG tablet Take 10 mg by mouth daily.    Marland Kitchen LORazepam (ATIVAN) 0.5 MG  tablet Take 1 tablet (0.5 mg total) by mouth at bedtime. 30 tablet 0  . ondansetron (ZOFRAN) 8 MG tablet Take 1 tablet (8 mg total) by mouth 2 (two) times daily as needed. Start on the third day after chemotherapy. 30 tablet 1  . oxyCODONE-acetaminophen (ROXICET) 5-325 MG per tablet Take 1 tablet by mouth every 4 (four) hours as needed. (Patient not taking: Reported on 04/15/2015) 30 tablet 0  . prochlorperazine (COMPAZINE) 10 MG tablet Take 1 tablet (10 mg total) by mouth every 6 (six) hours as needed (Nausea or vomiting). 30 tablet 1   No current facility-administered medications for this visit.    PHYSICAL EXAMINATION: ECOG PERFORMANCE STATUS: 1 - Symptomatic but completely ambulatory  Filed Vitals:   05/03/15 1411  BP: 147/65  Pulse: 79  Temp: 98.4 F (36.9 C)  Resp: 18   Filed Weights   05/03/15 1411  Weight: 139 lb 9.6 oz (63.322 kg)    GENERAL:alert, no distress and comfortable SKIN: skin color, texture, turgor are normal, no rashes or significant lesions EYES: normal, Conjunctiva are pink and non-injected, sclera clear OROPHARYNX:no exudate, no erythema and lips, buccal mucosa, and tongue normal  NECK: supple, thyroid normal size, non-tender, without nodularity LYMPH:  no palpable lymphadenopathy in the cervical, axillary or inguinal LUNGS: clear to auscultation and percussion with normal breathing effort HEART: regular rate & rhythm and no murmurs and no lower extremity edema ABDOMEN:abdomen soft, non-tender and normal bowel sounds Musculoskeletal:no cyanosis of digits and no clubbing  NEURO: alert & oriented x 3 with fluent speech, no focal motor/sensory deficits  LABORATORY DATA:  I have reviewed the data as listed   Chemistry      Component Value Date/Time   NA 140 05/03/2015 1330   NA 134* 04/15/2015 1556   K 3.6 05/03/2015 1330   K 3.7 04/15/2015 1556   CL 100* 04/15/2015 1556   CO2 25 05/03/2015 1330   CO2 26 04/15/2015 1556   BUN 10.4 05/03/2015 1330    BUN 10 04/15/2015 1556   CREATININE 1.0 05/03/2015 1330   CREATININE 1.18* 04/15/2015 1556      Component Value Date/Time   CALCIUM 10.3 05/03/2015 1330   CALCIUM 10.1 04/15/2015 1556   ALKPHOS 104 05/03/2015 1330   ALKPHOS 69 03/16/2015 1133   AST 18 05/03/2015 1330   AST 22 03/16/2015 1133   ALT 13 05/03/2015 1330   ALT 18 03/16/2015 1133   BILITOT 0.31 05/03/2015 1330   BILITOT 0.6 03/16/2015 1133       Lab Results  Component Value Date   WBC 19.2* 05/03/2015   HGB 10.5* 05/03/2015   HCT 31.7* 05/03/2015   MCV 85.1 05/03/2015   PLT 381 05/03/2015   NEUTROABS 16.6* 05/03/2015    ASSESSMENT & PLAN:  Breast cancer of upper-outer quadrant of left female breast Left breast invasive ductal carcinoma grade 3 ER 0% PR 0% HER-2 negative Ki-67 75%, 2.9 cm mass with microcalcifications plus abnormal lymph nodes biopsy-proven to be breast cancer T2 N1 M0 stage IIB clinical stage PET-CT: Neg Breast MRI: 3.1 cm left breast mass, 1.6 cm Left Axillary LN  Treatment Plan:  1. Neoadjuvant chemotherapy: Dose dense Adriamycin and Cytoxan 4 followed by Abraxane weekly 12 2. Consideration for [facility] clinical trial for surgery 3. Followed by adjuvant radiation Goal of treatment: Cure  Current Treatment: Dose Dense AC X 4 followed by weekly Abraxane X 12; Today is cycle 3 day 1 (given her advanced age, we are starting her chemotherapy and 50 mg/m2 doxorubicin and 500 mg/m2 Cytoxan)  Mild chronic kidney disease: Creatinine has improved to 1.1. She is again encouraged to stay well hydrated.  Chemotherapy toxicities: 1. Alopecia 2.  Mouth sores : I prescribed Magic mouthwash and decrease the dosage of fourth Adriamycin and Cytoxan 3.  Fatigue related to chemotherapy 4.  Loss of taste 5.  Anemia due to chemotherapy hemoglobin 10.5 today 6.  Leucocytosis  In spite of all of this she is tolerating chemotherapy fairly well.  RTC 2 weeks for cycle 1/12 Abraxane   No orders of the  defined types were placed in this encounter.   The patient has a good understanding of the overall plan. she agrees with it. she will call with any problems that may develop before the next visit here.   Rulon Eisenmenger, MD      He all

## 2015-05-03 NOTE — Patient Instructions (Signed)
[facility] Discharge Instructions for Patients Receiving Chemotherapy  Today you received the following chemotherapy agents adriamycin/cytoxan  To help prevent nausea and vomiting after your treatment, we encourage you to take your nausea medication as directed  If you develop nausea and vomiting that is not controlled by your nausea medication, call the clinic.   BELOW ARE SYMPTOMS THAT SHOULD BE REPORTED IMMEDIATELY:  *FEVER GREATER THAN 100.5 F  *CHILLS WITH OR WITHOUT FEVER  NAUSEA AND VOMITING THAT IS NOT CONTROLLED WITH YOUR NAUSEA MEDICATION  *UNUSUAL SHORTNESS OF BREATH  *UNUSUAL BRUISING OR BLEEDING  TENDERNESS IN MOUTH AND THROAT WITH OR WITHOUT PRESENCE OF ULCERS  *URINARY PROBLEMS  *BOWEL PROBLEMS  UNUSUAL RASH Items with * indicate a potential emergency and should be followed up as soon as possible.  Feel free to call the clinic you have any questions or concerns. The clinic phone number is [phone].  

## 2015-05-03 NOTE — Assessment & Plan Note (Signed)
Left breast invasive ductal carcinoma grade 3 ER 0% PR 0% HER-2 negative Ki-67 75%, 2.9 cm mass with microcalcifications plus abnormal lymph nodes biopsy-proven to be breast cancer T2 N1 M0 stage IIB clinical stage PET-CT: Neg Breast MRI: 3.1 cm left breast mass, 1.6 cm Left Axillary LN  Treatment Plan:  1. Neoadjuvant chemotherapy: Dose dense Adriamycin and Cytoxan 4 followed by Abraxane weekly 12 2. Consideration for [facility] clinical trial for surgery 3. Followed by adjuvant radiation Goal of treatment: Cure  Current Treatment: Dose Dense AC X 4 followed by weekly Abraxane X 12; Today is cycle 3 day 1 (given her advanced age, we are starting her chemotherapy and 50 mg/m2 doxorubicin and 500 mg/m2 Cytoxan)  Mild chronic kidney disease: Creatinine has improved to 1.1. She is again encouraged to stay well hydrated.  Chemotherapy toxicities: 1. Alopecia   RTC 2 weeks for cycle 1/12 Abraxane

## 2015-05-03 NOTE — Patient Instructions (Signed)

## 2015-05-04 NOTE — Progress Notes (Signed)
Magic mouthwash order faxed to pharmacy.  Sent to scan.

## 2015-05-16 NOTE — Assessment & Plan Note (Signed)
Left breast invasive ductal carcinoma grade 3 ER 0% PR 0% HER-2 negative Ki-67 75%, 2.9 cm mass with microcalcifications plus abnormal lymph nodes biopsy-proven to be breast cancer T2 N1 M0 stage IIB clinical stage PET-CT: Neg Breast MRI: 3.1 cm left breast mass, 1.6 cm Left Axillary LN  Treatment Plan:  1. Neoadjuvant chemotherapy: Dose dense Adriamycin and Cytoxan 4 followed by Abraxane weekly 12 2. Consideration for [facility] clinical trial for surgery 3. Followed by adjuvant radiation Goal of treatment: Cure  Current treatment: Abraxane week 1/12 Chemotherapy toxicities: 1. Alopecia 2. Mouth sores : I prescribed Magic mouthwash and decrease the dosage of fourth Adriamycin and Cytoxan 3. Fatigue related to chemotherapy 4. Loss of taste 5. Anemia due to chemotherapy hemoglobin 10.5 today 6. Leucocytosis  Return to clinic in 2 weeks for cycle 3/12 Abraxane

## 2015-05-17 ENCOUNTER — Ambulatory Visit (HOSPITAL_BASED_OUTPATIENT_CLINIC_OR_DEPARTMENT_OTHER): Payer: Medicare Other | Admitting: Hematology and Oncology

## 2015-05-17 ENCOUNTER — Ambulatory Visit: Payer: Medicare Other

## 2015-05-17 ENCOUNTER — Ambulatory Visit (HOSPITAL_BASED_OUTPATIENT_CLINIC_OR_DEPARTMENT_OTHER): Payer: Medicare Other

## 2015-05-17 ENCOUNTER — Other Ambulatory Visit: Payer: Self-pay | Admitting: Hematology and Oncology

## 2015-05-17 ENCOUNTER — Encounter: Payer: Self-pay | Admitting: Hematology and Oncology

## 2015-05-17 ENCOUNTER — Other Ambulatory Visit (HOSPITAL_BASED_OUTPATIENT_CLINIC_OR_DEPARTMENT_OTHER): Payer: Medicare Other

## 2015-05-17 ENCOUNTER — Encounter: Payer: Self-pay | Admitting: *Deleted

## 2015-05-17 VITALS — BP 121/50 | HR 84 | Temp 99.1°F | Resp 18 | Ht 61.0 in | Wt 133.3 lb

## 2015-05-17 DIAGNOSIS — C50412 Malignant neoplasm of upper-outer quadrant of left female breast: Secondary | ICD-10-CM

## 2015-05-17 DIAGNOSIS — C773 Secondary and unspecified malignant neoplasm of axilla and upper limb lymph nodes: Secondary | ICD-10-CM

## 2015-05-17 DIAGNOSIS — R5383 Other fatigue: Secondary | ICD-10-CM | POA: Diagnosis not present

## 2015-05-17 DIAGNOSIS — Z171 Estrogen receptor negative status [ER-]: Secondary | ICD-10-CM

## 2015-05-17 DIAGNOSIS — Z95828 Presence of other vascular implants and grafts: Secondary | ICD-10-CM

## 2015-05-17 DIAGNOSIS — D72828 Other elevated white blood cell count: Secondary | ICD-10-CM | POA: Diagnosis not present

## 2015-05-17 DIAGNOSIS — D6481 Anemia due to antineoplastic chemotherapy: Secondary | ICD-10-CM

## 2015-05-17 DIAGNOSIS — Z5111 Encounter for antineoplastic chemotherapy: Secondary | ICD-10-CM | POA: Diagnosis present

## 2015-05-17 LAB — COMPREHENSIVE METABOLIC PANEL (CC13)
ALK PHOS: 106 U/L (ref 40–150)
ALT: 13 U/L (ref 0–55)
AST: 16 U/L (ref 5–34)
Albumin: 3.8 g/dL (ref 3.5–5.0)
Anion Gap: 10 mEq/L (ref 3–11)
BUN: 8.3 mg/dL (ref 7.0–26.0)
CHLORIDE: 101 meq/L (ref 98–109)
CO2: 28 mEq/L (ref 22–29)
Calcium: 10.6 mg/dL — ABNORMAL HIGH (ref 8.4–10.4)
Creatinine: 1.2 mg/dL — ABNORMAL HIGH (ref 0.6–1.1)
EGFR: 44 mL/min/{1.73_m2} — AB (ref >90)
Glucose: 138 mg/dl (ref 70–140)
Potassium: 3.2 mEq/L — ABNORMAL LOW (ref 3.5–5.1)
Sodium: 140 mEq/L (ref 136–145)
Total Bilirubin: 0.4 mg/dL (ref 0.20–1.20)
Total Protein: 6.4 g/dL (ref 6.4–8.3)

## 2015-05-17 LAB — CBC WITH DIFFERENTIAL/PLATELET
BASO%: 0.8 % (ref 0.0–2.0)
BASOS ABS: 0.1 10*3/uL (ref 0.0–0.1)
EOS ABS: 0 10*3/uL (ref 0.0–0.5)
EOS%: 0.1 % (ref 0.0–7.0)
HCT: 31.8 % — ABNORMAL LOW (ref 34.8–46.6)
HGB: 10.4 g/dL — ABNORMAL LOW (ref 11.6–15.9)
LYMPH%: 5.1 % — AB (ref 14.0–49.7)
MCH: 28 pg (ref 25.1–34.0)
MCHC: 32.9 g/dL (ref 31.5–36.0)
MCV: 85.3 fL (ref 79.5–101.0)
MONO#: 1.2 10*3/uL — ABNORMAL HIGH (ref 0.1–0.9)
MONO%: 6.7 % (ref 0.0–14.0)
NEUT%: 87.3 % — ABNORMAL HIGH (ref 38.4–76.8)
NEUTROS ABS: 15.9 10*3/uL — AB (ref 1.5–6.5)
PLATELETS: 348 10*3/uL (ref 145–400)
RBC: 3.72 10*6/uL (ref 3.70–5.45)
RDW: 16.1 % — AB (ref 11.2–14.5)
WBC: 18.2 10*3/uL — ABNORMAL HIGH (ref 3.9–10.3)
lymph#: 0.9 10*3/uL (ref 0.9–3.3)

## 2015-05-17 MED ORDER — PALONOSETRON HCL INJECTION 0.25 MG/5ML
INTRAVENOUS | Status: AC
  Filled 2015-05-17: qty 5

## 2015-05-17 MED ORDER — SODIUM CHLORIDE 0.9 % IJ SOLN
10.0000 mL | INTRAMUSCULAR | Status: DC | PRN
  Administered 2015-05-17: 10 mL
  Filled 2015-05-17: qty 10

## 2015-05-17 MED ORDER — PACLITAXEL PROTEIN-BOUND CHEMO INJECTION 100 MG
65.0000 mg/m2 | Freq: Once | INTRAVENOUS | Status: AC
  Administered 2015-05-17: 100 mg via INTRAVENOUS
  Filled 2015-05-17: qty 20

## 2015-05-17 MED ORDER — PALONOSETRON HCL INJECTION 0.25 MG/5ML
0.2500 mg | Freq: Once | INTRAVENOUS | Status: AC
  Administered 2015-05-17: 0.25 mg via INTRAVENOUS

## 2015-05-17 MED ORDER — HEPARIN SOD (PORK) LOCK FLUSH 100 UNIT/ML IV SOLN
500.0000 [IU] | Freq: Once | INTRAVENOUS | Status: DC | PRN
  Filled 2015-05-17: qty 5

## 2015-05-17 MED ORDER — SODIUM CHLORIDE 0.9 % IJ SOLN
10.0000 mL | INTRAMUSCULAR | Status: DC | PRN
  Administered 2015-05-17: 10 mL via INTRAVENOUS
  Filled 2015-05-17: qty 10

## 2015-05-17 MED ORDER — SODIUM CHLORIDE 0.9 % IV SOLN
Freq: Once | INTRAVENOUS | Status: AC
  Administered 2015-05-17: 13:00:00 via INTRAVENOUS

## 2015-05-17 NOTE — Addendum Note (Signed)
Addended by: Prentiss Bells on: 05/17/2015 12:26 PM   Modules accepted: Medications

## 2015-05-17 NOTE — Progress Notes (Signed)
Unable to obtain labs from [facility].  Pt sent to get labs from phlebotomist.

## 2015-05-17 NOTE — Telephone Encounter (Signed)
Active treatment  

## 2015-05-17 NOTE — Patient Instructions (Signed)
[facility] Discharge Instructions for Patients Receiving Chemotherapy  Today you received the following chemotherapy agents: Abraxane   To help prevent nausea and vomiting after your treatment, we encourage you to take your nausea medication as directed.    If you develop nausea and vomiting that is not controlled by your nausea medication, call the clinic.   BELOW ARE SYMPTOMS THAT SHOULD BE REPORTED IMMEDIATELY:  *FEVER GREATER THAN 100.5 F  *CHILLS WITH OR WITHOUT FEVER  NAUSEA AND VOMITING THAT IS NOT CONTROLLED WITH YOUR NAUSEA MEDICATION  *UNUSUAL SHORTNESS OF BREATH  *UNUSUAL BRUISING OR BLEEDING  TENDERNESS IN MOUTH AND THROAT WITH OR WITHOUT PRESENCE OF ULCERS  *URINARY PROBLEMS  *BOWEL PROBLEMS  UNUSUAL RASH Items with * indicate a potential emergency and should be followed up as soon as possible.  Feel free to call the clinic you have any questions or concerns. The clinic phone number is [phone].  Please show the CHEMO ALERT CARD at check-in to the Emergency Department and triage nurse.   

## 2015-05-17 NOTE — Progress Notes (Signed)
Patient Care Team: Thressa Sheller, MD as PCP - General (Internal Medicine) Erroll Luna, MD as Consulting Physician (General Surgery) Nicholas Lose, MD as Consulting Physician (Hematology and Oncology) Gery Pray, MD as Consulting Physician (Radiation Oncology) Mauro Kaufmann, RN as Registered Nurse Rockwell Germany, RN as Registered Nurse  DIAGNOSIS: Breast cancer of upper-outer quadrant of left female breast   Staging form: Breast, AJCC 7th Edition     Clinical stage from 03/07/2015: Stage IIB (T2, N1, M0) - Unsigned       Staging comments: Staged at breast conference on 5.18.16    SUMMARY OF ONCOLOGIC HISTORY:   Breast cancer of upper-outer quadrant of left female breast   02/15/2015 Mammogram Left breast increased density, mass is irregular with microcalcifications measuring 2.9 cm, cyst in the breast as well as abnormal enlarged lymph nodes   02/20/2015 Initial Diagnosis Left breast biopsy: Invasive ductal carcinoma with DCIS, axillary lymph node biopsy positive, ER 0%, PR 0%, HER-2 negative ratio 1.13 with Ki-67 75%   03/15/2015 PET scan 2.8 cm solid irregular left breast mass which is hypermetabolic and consistent with known left breast cancer. 2. Weakly positive axillary lymph nodes or equivocal   03/20/2015 Breast MRI 3.1 cm irregular enhancing mass located within the left breast at the 1 o'clock position; 1.6 cm Left axillary LN   03/22/2015 -  Neo-Adjuvant Chemotherapy Neo-adjuvant chemotherapy with dose dense Adriamycin and Cytoxan 4 followed by Abraxane weekly 12    CHIEF COMPLIANT: Cycle 1 of Abraxane  INTERVAL HISTORY: Carla Bennett is a 80 year old with above-mentioned history of left breast cancer currently on neo-adjuvant chemotherapy. She completed 4 cycles of dose dense Adriamycin and Cytoxan and today is cycle 1 of Abraxane. She had tolerated prior chemotherapy fairly well. She had leukocytosis of an effect of Neulasta. Her other symptoms include fatigue and anemia  related to chemotherapy.  REVIEW OF SYSTEMS:   Constitutional: Denies fevers, chills or abnormal weight loss Eyes: Denies blurriness of vision Ears, nose, mouth, throat, and face: Denies mucositis or sore throat Respiratory: Denies cough, dyspnea or wheezes Cardiovascular: Denies palpitation, chest discomfort or lower extremity swelling Gastrointestinal:  Denies nausea, heartburn or change in bowel habits Skin: Denies abnormal skin rashes Lymphatics: Denies new lymphadenopathy or easy bruising Neurological:Denies numbness, tingling or new weaknesses Behavioral/Psych: Mood is stable, no new changes  All other systems were reviewed with the patient and are negative.  I have reviewed the past medical history, past surgical history, social history and family history with the patient and they are unchanged from previous note.  ALLERGIES:  has No Known Allergies.  MEDICATIONS:  Current Outpatient Prescriptions  Medication Sig Dispense Refill  . Alum & Mag Hydroxide-Simeth (MAGIC MOUTHWASH W/LIDOCAINE) SOLN Take 5 mLs by mouth 3 (three) times daily. 100 mL 0  . B Complex Vitamins (VITAMIN B COMPLEX PO) Take 1 tablet by mouth daily.     Marland Kitchen dexamethasone (DECADRON) 4 MG tablet Take 1 tablet (4 mg total) by mouth 2 (two) times daily. Take 2 tablets by mouth once a day on the day after chemotherapy and then take 2 tablets two times a day for 2 days. Take with food. 30 tablet 1  . estradiol (ESTRACE) 0.5 MG tablet Take 1/2 tablet daily. 45 tablet 4  . furosemide (LASIX) 20 MG tablet Take 20 mg by mouth daily.     Marland Kitchen labetalol (NORMODYNE) 200 MG tablet Take 200 mg by mouth 2 (two) times daily.  6  . lidocaine-prilocaine (EMLA) cream  Apply to port-a-cath 1-2 hours prior to access. 30 g 3  . lisinopril (PRINIVIL,ZESTRIL) 40 MG tablet Take 40 mg by mouth daily.     Marland Kitchen loperamide (IMODIUM A-D) 2 MG tablet Take 2 mg by mouth 4 (four) times daily as needed for diarrhea or loose stools (diarrhea).    .  loratadine (CLARITIN) 10 MG tablet Take 10 mg by mouth daily.    Marland Kitchen LORazepam (ATIVAN) 0.5 MG tablet TAKE 1 TABLET BY MOUTH AT BEDTIME 30 tablet 0  . ondansetron (ZOFRAN) 8 MG tablet Take 1 tablet (8 mg total) by mouth 2 (two) times daily as needed. Start on the third day after chemotherapy. 30 tablet 1  . oxyCODONE-acetaminophen (ROXICET) 5-325 MG per tablet Take 1 tablet by mouth every 4 (four) hours as needed. 30 tablet 0  . prochlorperazine (COMPAZINE) 10 MG tablet Take 1 tablet (10 mg total) by mouth every 6 (six) hours as needed (Nausea or vomiting). 30 tablet 1   No current facility-administered medications for this visit.    PHYSICAL EXAMINATION: ECOG PERFORMANCE STATUS: 1 - Symptomatic but completely ambulatory  Filed Vitals:   05/17/15 1151  BP: 121/50  Pulse: 84  Temp: 99.1 F (37.3 C)  Resp: 18   Filed Weights   05/17/15 1151  Weight: 133 lb 4.8 oz (60.464 kg)    GENERAL:alert, no distress and comfortable SKIN: skin color, texture, turgor are normal, no rashes or significant lesions EYES: normal, Conjunctiva are pink and non-injected, sclera clear OROPHARYNX:no exudate, no erythema and lips, buccal mucosa, and tongue normal  NECK: supple, thyroid normal size, non-tender, without nodularity LYMPH:  no palpable lymphadenopathy in the cervical, axillary or inguinal LUNGS: clear to auscultation and percussion with normal breathing effort HEART: regular rate & rhythm and no murmurs and no lower extremity edema ABDOMEN:abdomen soft, non-tender and normal bowel sounds Musculoskeletal:no cyanosis of digits and no clubbing  NEURO: alert & oriented x 3 with fluent speech, no focal motor/sensory deficits  LABORATORY DATA:  I have reviewed the data as listed   Chemistry      Component Value Date/Time   NA 140 05/17/2015 1028   NA 134* 04/15/2015 1556   K 3.2* 05/17/2015 1028   K 3.7 04/15/2015 1556   CL 100* 04/15/2015 1556   CO2 28 05/17/2015 1028   CO2 26 04/15/2015  1556   BUN 8.3 05/17/2015 1028   BUN 10 04/15/2015 1556   CREATININE 1.2* 05/17/2015 1028   CREATININE 1.18* 04/15/2015 1556      Component Value Date/Time   CALCIUM 10.6* 05/17/2015 1028   CALCIUM 10.1 04/15/2015 1556   ALKPHOS 106 05/17/2015 1028   ALKPHOS 69 03/16/2015 1133   AST 16 05/17/2015 1028   AST 22 03/16/2015 1133   ALT 13 05/17/2015 1028   ALT 18 03/16/2015 1133   BILITOT 0.40 05/17/2015 1028   BILITOT 0.6 03/16/2015 1133       Lab Results  Component Value Date   WBC 18.2* 05/17/2015   HGB 10.4* 05/17/2015   HCT 31.8* 05/17/2015   MCV 85.3 05/17/2015   PLT 348 05/17/2015   NEUTROABS 15.9* 05/17/2015   ASSESSMENT & PLAN:  Breast cancer of upper-outer quadrant of left female breast Left breast invasive ductal carcinoma grade 3 ER 0% PR 0% HER-2 negative Ki-67 75%, 2.9 cm mass with microcalcifications plus abnormal lymph nodes biopsy-proven to be breast cancer T2 N1 M0 stage IIB clinical stage PET-CT: Neg Breast MRI: 3.1 cm left breast mass, 1.6 cm  Left Axillary LN  Treatment Plan:  1. Neoadjuvant chemotherapy: Dose dense Adriamycin and Cytoxan 4 followed by Abraxane weekly 12 2. Consideration for [facility] clinical trial for surgery 3. Followed by adjuvant radiation Goal of treatment: Cure  Current treatment: Abraxane week 1/12 Chemotherapy toxicities: 1. Alopecia 2. Mouth sores : I prescribed Magic mouthwash and decrease the dosage of fourth Adriamycin and Cytoxan 3. Fatigue related to chemotherapy 4. Loss of taste 5. Anemia due to chemotherapy hemoglobin 10.5 today 6. Leucocytosis 7. Lip sore  Return to clinic in 1 weeks for cycle 2/12 Abraxane   No orders of the defined types were placed in this encounter.   The patient has a good understanding of the overall plan. she agrees with it. she will call with any problems that may develop before the next visit here.   Rulon Eisenmenger, MD

## 2015-05-17 NOTE — Patient Instructions (Signed)

## 2015-05-18 ENCOUNTER — Telehealth: Payer: Self-pay | Admitting: Hematology and Oncology

## 2015-05-18 NOTE — Telephone Encounter (Signed)
Called and left a message with dr Lindi Adie double book appointment per staff message

## 2015-05-23 ENCOUNTER — Other Ambulatory Visit: Payer: Self-pay

## 2015-05-23 DIAGNOSIS — C50412 Malignant neoplasm of upper-outer quadrant of left female breast: Secondary | ICD-10-CM

## 2015-05-24 ENCOUNTER — Ambulatory Visit (HOSPITAL_BASED_OUTPATIENT_CLINIC_OR_DEPARTMENT_OTHER): Payer: Medicare Other

## 2015-05-24 ENCOUNTER — Other Ambulatory Visit (HOSPITAL_BASED_OUTPATIENT_CLINIC_OR_DEPARTMENT_OTHER): Payer: Medicare Other

## 2015-05-24 ENCOUNTER — Ambulatory Visit: Payer: Medicare Other

## 2015-05-24 ENCOUNTER — Ambulatory Visit (HOSPITAL_BASED_OUTPATIENT_CLINIC_OR_DEPARTMENT_OTHER): Payer: Medicare Other | Admitting: Hematology and Oncology

## 2015-05-24 ENCOUNTER — Other Ambulatory Visit: Payer: Medicare Other

## 2015-05-24 ENCOUNTER — Encounter: Payer: Self-pay | Admitting: Hematology and Oncology

## 2015-05-24 VITALS — BP 136/65 | HR 118 | Temp 98.7°F | Resp 18 | Ht 61.0 in | Wt 131.8 lb

## 2015-05-24 DIAGNOSIS — C773 Secondary and unspecified malignant neoplasm of axilla and upper limb lymph nodes: Secondary | ICD-10-CM

## 2015-05-24 DIAGNOSIS — C50419 Malignant neoplasm of upper-outer quadrant of unspecified female breast: Secondary | ICD-10-CM | POA: Diagnosis not present

## 2015-05-24 DIAGNOSIS — C50412 Malignant neoplasm of upper-outer quadrant of left female breast: Secondary | ICD-10-CM

## 2015-05-24 DIAGNOSIS — Z5111 Encounter for antineoplastic chemotherapy: Secondary | ICD-10-CM

## 2015-05-24 DIAGNOSIS — D6481 Anemia due to antineoplastic chemotherapy: Secondary | ICD-10-CM

## 2015-05-24 DIAGNOSIS — D72828 Other elevated white blood cell count: Secondary | ICD-10-CM | POA: Diagnosis not present

## 2015-05-24 DIAGNOSIS — R5383 Other fatigue: Secondary | ICD-10-CM | POA: Diagnosis present

## 2015-05-24 DIAGNOSIS — Z171 Estrogen receptor negative status [ER-]: Secondary | ICD-10-CM | POA: Diagnosis not present

## 2015-05-24 DIAGNOSIS — Z95828 Presence of other vascular implants and grafts: Secondary | ICD-10-CM

## 2015-05-24 LAB — CBC WITH DIFFERENTIAL/PLATELET
BASO%: 0.7 % (ref 0.0–2.0)
Basophils Absolute: 0.1 10*3/uL (ref 0.0–0.1)
EOS ABS: 0 10*3/uL (ref 0.0–0.5)
EOS%: 0.3 % (ref 0.0–7.0)
HEMATOCRIT: 31.6 % — AB (ref 34.8–46.6)
HGB: 10.5 g/dL — ABNORMAL LOW (ref 11.6–15.9)
LYMPH%: 6.9 % — ABNORMAL LOW (ref 14.0–49.7)
MCH: 28.8 pg (ref 25.1–34.0)
MCHC: 33.2 g/dL (ref 31.5–36.0)
MCV: 86.6 fL (ref 79.5–101.0)
MONO#: 1.3 10*3/uL — ABNORMAL HIGH (ref 0.1–0.9)
MONO%: 11.9 % (ref 0.0–14.0)
NEUT%: 80.2 % — AB (ref 38.4–76.8)
NEUTROS ABS: 8.8 10*3/uL — AB (ref 1.5–6.5)
Platelets: 391 10*3/uL (ref 145–400)
RBC: 3.65 10*6/uL — ABNORMAL LOW (ref 3.70–5.45)
RDW: 16.9 % — ABNORMAL HIGH (ref 11.2–14.5)
WBC: 10.9 10*3/uL — ABNORMAL HIGH (ref 3.9–10.3)
lymph#: 0.8 10*3/uL — ABNORMAL LOW (ref 0.9–3.3)

## 2015-05-24 LAB — COMPREHENSIVE METABOLIC PANEL (CC13)
ALT: 15 U/L (ref 0–55)
AST: 20 U/L (ref 5–34)
Albumin: 3.9 g/dL (ref 3.5–5.0)
Alkaline Phosphatase: 74 U/L (ref 40–150)
Anion Gap: 9 mEq/L (ref 3–11)
BUN: 10.3 mg/dL (ref 7.0–26.0)
CALCIUM: 10.9 mg/dL — AB (ref 8.4–10.4)
CHLORIDE: 98 meq/L (ref 98–109)
CO2: 28 meq/L (ref 22–29)
Creatinine: 1.1 mg/dL (ref 0.6–1.1)
EGFR: 46 mL/min/{1.73_m2} — ABNORMAL LOW (ref >90)
Glucose: 113 mg/dl (ref 70–140)
Potassium: 3.9 mEq/L (ref 3.5–5.1)
SODIUM: 135 meq/L — AB (ref 136–145)
TOTAL PROTEIN: 6.6 g/dL (ref 6.4–8.3)
Total Bilirubin: 0.76 mg/dL (ref 0.20–1.20)

## 2015-05-24 MED ORDER — PALONOSETRON HCL INJECTION 0.25 MG/5ML
0.2500 mg | Freq: Once | INTRAVENOUS | Status: AC
  Administered 2015-05-24: 0.25 mg via INTRAVENOUS

## 2015-05-24 MED ORDER — PACLITAXEL PROTEIN-BOUND CHEMO INJECTION 100 MG
65.0000 mg/m2 | Freq: Once | INTRAVENOUS | Status: AC
  Administered 2015-05-24: 100 mg via INTRAVENOUS
  Filled 2015-05-24: qty 20

## 2015-05-24 MED ORDER — SODIUM CHLORIDE 0.9 % IJ SOLN
10.0000 mL | INTRAMUSCULAR | Status: DC | PRN
  Administered 2015-05-24: 10 mL via INTRAVENOUS
  Filled 2015-05-24: qty 10

## 2015-05-24 MED ORDER — HEPARIN SOD (PORK) LOCK FLUSH 100 UNIT/ML IV SOLN
500.0000 [IU] | Freq: Once | INTRAVENOUS | Status: AC | PRN
  Administered 2015-05-24: 500 [IU]
  Filled 2015-05-24: qty 5

## 2015-05-24 MED ORDER — SODIUM CHLORIDE 0.9 % IJ SOLN
10.0000 mL | INTRAMUSCULAR | Status: DC | PRN
  Administered 2015-05-24: 10 mL
  Filled 2015-05-24: qty 10

## 2015-05-24 MED ORDER — SODIUM CHLORIDE 0.9 % IV SOLN
Freq: Once | INTRAVENOUS | Status: AC
  Administered 2015-05-24: 15:00:00 via INTRAVENOUS

## 2015-05-24 MED ORDER — PALONOSETRON HCL INJECTION 0.25 MG/5ML
INTRAVENOUS | Status: AC
  Filled 2015-05-24: qty 5

## 2015-05-24 NOTE — Progress Notes (Signed)
Patient Care Team: Thressa Sheller, MD as PCP - General (Internal Medicine) Erroll Luna, MD as Consulting Physician (General Surgery) Nicholas Lose, MD as Consulting Physician (Hematology and Oncology) Gery Pray, MD as Consulting Physician (Radiation Oncology) Mauro Kaufmann, RN as Registered Nurse Rockwell Germany, RN as Registered Nurse  DIAGNOSIS: Breast cancer of upper-outer quadrant of left female breast   Staging form: Breast, AJCC 7th Edition     Clinical stage from 03/07/2015: Stage IIB (T2, N1, M0) - Unsigned       Staging comments: Staged at breast conference on 5.18.16    SUMMARY OF ONCOLOGIC HISTORY:   Breast cancer of upper-outer quadrant of left female breast   02/15/2015 Mammogram Left breast increased density, mass is irregular with microcalcifications measuring 2.9 cm, cyst in the breast as well as abnormal enlarged lymph nodes   02/20/2015 Initial Diagnosis Left breast biopsy: Invasive ductal carcinoma with DCIS, axillary lymph node biopsy positive, ER 0%, PR 0%, HER-2 negative ratio 1.13 with Ki-67 75%   03/15/2015 PET scan 2.8 cm solid irregular left breast mass which is hypermetabolic and consistent with known left breast cancer. 2. Weakly positive axillary lymph nodes or equivocal   03/20/2015 Breast MRI 3.1 cm irregular enhancing mass located within the left breast at the 1 o'clock position; 1.6 cm Left axillary LN   03/22/2015 -  Neo-Adjuvant Chemotherapy Neo-adjuvant chemotherapy with dose dense Adriamycin and Cytoxan 4 followed by Abraxane weekly 12    CHIEF COMPLIANT: Week 2 of Abraxane  INTERVAL HISTORY: Carla Bennett is a 80 year old with above-mentioned history of left breast cancer currently Adjuvant chemotherapy with Abraxane. She tolerated week 1 of treatment fairly well. Her major issue is fatigue and decreased strength. She has not been eating much food because it doesn't taste well. Denies any nausea vomiting. Denies any fevers or chills.  REVIEW  OF SYSTEMS:   Constitutional: Denies fevers, chills or abnormal weight loss Eyes: Denies blurriness of vision Ears, nose, mouth, throat, and face: Denies mucositis or sore throat Respiratory: Denies cough, dyspnea or wheezes Cardiovascular: Denies palpitation, chest discomfort or lower extremity swelling Gastrointestinal:  Denies nausea, heartburn or change in bowel habits Skin: Denies abnormal skin rashes Lymphatics: Denies new lymphadenopathy or easy bruising Neurological:Denies numbness, tingling or new weaknesses Complains of fatigue All other systems were reviewed with the patient and are negative.  I have reviewed the past medical history, past surgical history, social history and family history with the patient and they are unchanged from previous note.  ALLERGIES:  has No Known Allergies.  MEDICATIONS:  Current Outpatient Prescriptions  Medication Sig Dispense Refill  . Alum & Mag Hydroxide-Simeth (MAGIC MOUTHWASH W/LIDOCAINE) SOLN Take 5 mLs by mouth 3 (three) times daily. 100 mL 0  . B Complex Vitamins (VITAMIN B COMPLEX PO) Take 1 tablet by mouth daily.     Marland Kitchen dexamethasone (DECADRON) 4 MG tablet Take 1 tablet (4 mg total) by mouth 2 (two) times daily. Take 2 tablets by mouth once a day on the day after chemotherapy and then take 2 tablets two times a day for 2 days. Take with food. 30 tablet 1  . estradiol (ESTRACE) 0.5 MG tablet Take 1/2 tablet daily. 45 tablet 4  . furosemide (LASIX) 20 MG tablet Take 20 mg by mouth daily.     Marland Kitchen labetalol (NORMODYNE) 200 MG tablet Take 200 mg by mouth 2 (two) times daily.  6  . lidocaine-prilocaine (EMLA) cream Apply to port-a-cath 1-2 hours prior to access. [place]  g 3  . lisinopril (PRINIVIL,ZESTRIL) 40 MG tablet Take 40 mg by mouth daily.     Marland Kitchen loperamide (IMODIUM A-D) 2 MG tablet Take 2 mg by mouth 4 (four) times daily as needed for diarrhea or loose stools (diarrhea).    . loratadine (CLARITIN) 10 MG tablet Take 10 mg by mouth daily.    Marland Kitchen  LORazepam (ATIVAN) 0.5 MG tablet TAKE 1 TABLET BY MOUTH AT BEDTIME 30 tablet 0  . ondansetron (ZOFRAN) 8 MG tablet     . oxyCODONE-acetaminophen (ROXICET) 5-325 MG per tablet Take 1 tablet by mouth every 4 (four) hours as needed. 30 tablet 0  . prochlorperazine (COMPAZINE) 10 MG tablet TAKE 1 TABLET BY MOUTH EVERY 6 HOURS AS NEEDED FOR NAUSEA AND VOMITING  1   No current facility-administered medications for this visit.    PHYSICAL EXAMINATION: ECOG PERFORMANCE STATUS: 1 - Symptomatic but completely ambulatory  Filed Vitals:   05/24/15 1343  BP: 136/65  Pulse: 118  Temp: 98.7 F (37.1 C)  Resp: 18   Filed Weights   05/24/15 1343  Weight: 131 lb 12.8 oz (59.784 kg)    GENERAL:alert, no distress and comfortable SKIN: skin color, texture, turgor are normal, no rashes or significant lesions EYES: normal, Conjunctiva are pink and non-injected, sclera clear OROPHARYNX:no exudate, no erythema and lips, buccal mucosa, and tongue normal  NECK: supple, thyroid normal size, non-tender, without nodularity LYMPH:  no palpable lymphadenopathy in the cervical, axillary or inguinal LUNGS: clear to auscultation and percussion with normal breathing effort HEART: regular rate & rhythm and no murmurs and no lower extremity edema ABDOMEN:abdomen soft, non-tender and normal bowel sounds Musculoskeletal:no cyanosis of digits and no clubbing  NEURO: alert & oriented x 3 with fluent speech, no focal motor/sensory deficits  LABORATORY DATA:  I have reviewed the data as listed   Chemistry      Component Value Date/Time   NA 135* 05/24/2015 1252   NA 134* 04/15/2015 1556   K 3.9 05/24/2015 1252   K 3.7 04/15/2015 1556   CL 100* 04/15/2015 1556   CO2 28 05/24/2015 1252   CO2 26 04/15/2015 1556   BUN 10.3 05/24/2015 1252   BUN 10 04/15/2015 1556   CREATININE 1.1 05/24/2015 1252   CREATININE 1.18* 04/15/2015 1556      Component Value Date/Time   CALCIUM 10.9* 05/24/2015 1252   CALCIUM 10.1  04/15/2015 1556   ALKPHOS 74 05/24/2015 1252   ALKPHOS 69 03/16/2015 1133   AST 20 05/24/2015 1252   AST 22 03/16/2015 1133   ALT 15 05/24/2015 1252   ALT 18 03/16/2015 1133   BILITOT 0.76 05/24/2015 1252   BILITOT 0.6 03/16/2015 1133       Lab Results  Component Value Date   WBC 10.9* 05/24/2015   HGB 10.5* 05/24/2015   HCT 31.6* 05/24/2015   MCV 86.6 05/24/2015   PLT 391 05/24/2015   NEUTROABS 8.8* 05/24/2015    ASSESSMENT & PLAN:  Breast cancer of upper-outer quadrant of left female breast Left breast invasive ductal carcinoma grade 3 ER 0% PR 0% HER-2 negative Ki-67 75%, 2.9 cm mass with microcalcifications plus abnormal lymph nodes biopsy-proven to be breast cancer T2 N1 M0 stage IIB clinical stage PET-CT: Neg Breast MRI: 3.1 cm left breast mass, 1.6 cm Left Axillary LN  Treatment Plan:  1. Neoadjuvant chemotherapy: Dose dense Adriamycin and Cytoxan 4 followed by Abraxane weekly 12 2. Consideration for [facility] clinical trial for surgery 3. Followed by adjuvant  radiation Goal of treatment: Cure  Current treatment: Abraxane week 2/12 Chemotherapy toxicities: 1. Alopecia 2. Mouth sores : I prescribed Magic mouthwash and decrease the dosage of fourth Adriamycin and Cytoxan 3. Fatigue related to chemotherapy 4. Loss of taste 5. Anemia due to chemotherapy hemoglobin 10.5 today 6. Leucocytosis 7. Lip sore: resolved 8. Weight loss: Due to decreased taste. I discussed at length that she needs to find ways to increase her protein intake in her diet. We discussed several options with her.  Return to clinic in 1 weeks for cycle 3/12 Abraxane   Orders Placed This Encounter  Procedures  . PTH, intact and calcium    Standing Status: Future     Number of Occurrences:      Standing Expiration Date: 05/23/2016  . TSH    Standing Status: Future     Number of Occurrences:      Standing Expiration Date: 06/27/2016  . T3, free    Standing Status: Future     Number of  Occurrences:      Standing Expiration Date: 05/23/2016  . T4    Standing Status: Future     Number of Occurrences:      Standing Expiration Date: 05/23/2016   The patient has a good understanding of the overall plan. she agrees with it. she will call with any problems that may develop before the next visit here.   Rulon Eisenmenger, MD

## 2015-05-24 NOTE — Patient Instructions (Signed)
[facility] Discharge Instructions for Patients Receiving Chemotherapy  Today you received the following chemotherapy agents Abraxane  To help prevent nausea and vomiting after your treatment, we encourage you to take your nausea medication    If you develop nausea and vomiting that is not controlled by your nausea medication, call the clinic.   BELOW ARE SYMPTOMS THAT SHOULD BE REPORTED IMMEDIATELY:  *FEVER GREATER THAN 100.5 F  *CHILLS WITH OR WITHOUT FEVER  NAUSEA AND VOMITING THAT IS NOT CONTROLLED WITH YOUR NAUSEA MEDICATION  *UNUSUAL SHORTNESS OF BREATH  *UNUSUAL BRUISING OR BLEEDING  TENDERNESS IN MOUTH AND THROAT WITH OR WITHOUT PRESENCE OF ULCERS  *URINARY PROBLEMS  *BOWEL PROBLEMS  UNUSUAL RASH Items with * indicate a potential emergency and should be followed up as soon as possible.  Feel free to call the clinic you have any questions or concerns. The clinic phone number is [phone].  Please show the CHEMO ALERT CARD at check-in to the Emergency Department and triage nurse.   

## 2015-05-24 NOTE — Patient Instructions (Signed)

## 2015-05-24 NOTE — Assessment & Plan Note (Signed)
Left breast invasive ductal carcinoma grade 3 ER 0% PR 0% HER-2 negative Ki-67 75%, 2.9 cm mass with microcalcifications plus abnormal lymph nodes biopsy-proven to be breast cancer T2 N1 M0 stage IIB clinical stage PET-CT: Neg Breast MRI: 3.1 cm left breast mass, 1.6 cm Left Axillary LN  Treatment Plan:  1. Neoadjuvant chemotherapy: Dose dense Adriamycin and Cytoxan 4 followed by Abraxane weekly 12 2. Consideration for [facility] clinical trial for surgery 3. Followed by adjuvant radiation Goal of treatment: Cure  Current treatment: Abraxane week 2/12 Chemotherapy toxicities: 1. Alopecia 2. Mouth sores : I prescribed Magic mouthwash and decrease the dosage of fourth Adriamycin and Cytoxan 3. Fatigue related to chemotherapy 4. Loss of taste 5. Anemia due to chemotherapy hemoglobin 10.5 today 6. Leucocytosis 7. Lip sore  Return to clinic in 1 weeks for cycle 3/12 Abraxane

## 2015-05-25 ENCOUNTER — Encounter: Payer: Self-pay | Admitting: *Deleted

## 2015-05-25 LAB — T3, FREE: T3 FREE: 2.4 pg/mL (ref 2.3–4.2)

## 2015-05-25 LAB — PTH, INTACT AND CALCIUM
Calcium: 10.5 mg/dL (ref 8.4–10.5)
PTH: 166 pg/mL — ABNORMAL HIGH (ref 14–64)

## 2015-05-25 LAB — TSH CHCC: TSH: 0.528 m[IU]/L (ref 0.308–3.960)

## 2015-05-25 LAB — T4: T4, Total: 6.3 ug/dL (ref 4.5–12.0)

## 2015-05-29 ENCOUNTER — Telehealth: Payer: Self-pay | Admitting: *Deleted

## 2015-05-29 NOTE — Telephone Encounter (Signed)
VM message received from patient @ 12:15 pm stating she is having trouble with constipation.  Returned call to patient. She states she has not had a good bowel movement since this past Friday. She states there are too many choices at drug store for laxatives and desires some guidance. Advised patient to get Senna S or comparable store brand. Then to take 2 tabs tonight, increase fluid intake. She can expect results by morning. If not, then advised pt to take 2 more in the morning. Also advised to try warmed prune juice today in addition to increased fluids. Pt voiced understanding. Advised pt to call back tomorrow if above suggestions are ineffective. Pt agreeable.

## 2015-05-31 ENCOUNTER — Encounter: Payer: Self-pay | Admitting: *Deleted

## 2015-05-31 ENCOUNTER — Other Ambulatory Visit (HOSPITAL_BASED_OUTPATIENT_CLINIC_OR_DEPARTMENT_OTHER): Payer: Medicare Other

## 2015-05-31 ENCOUNTER — Ambulatory Visit (HOSPITAL_BASED_OUTPATIENT_CLINIC_OR_DEPARTMENT_OTHER): Payer: Medicare Other | Admitting: Hematology and Oncology

## 2015-05-31 ENCOUNTER — Encounter: Payer: Self-pay | Admitting: Hematology and Oncology

## 2015-05-31 ENCOUNTER — Other Ambulatory Visit: Payer: Medicare Other

## 2015-05-31 ENCOUNTER — Ambulatory Visit (HOSPITAL_BASED_OUTPATIENT_CLINIC_OR_DEPARTMENT_OTHER): Payer: Medicare Other

## 2015-05-31 ENCOUNTER — Ambulatory Visit: Payer: Medicare Other

## 2015-05-31 ENCOUNTER — Other Ambulatory Visit: Payer: Self-pay

## 2015-05-31 VITALS — BP 150/77 | HR 102 | Temp 98.4°F | Resp 18 | Ht 61.0 in | Wt 129.4 lb

## 2015-05-31 VITALS — HR 99

## 2015-05-31 DIAGNOSIS — D6481 Anemia due to antineoplastic chemotherapy: Secondary | ICD-10-CM | POA: Diagnosis not present

## 2015-05-31 DIAGNOSIS — C50412 Malignant neoplasm of upper-outer quadrant of left female breast: Secondary | ICD-10-CM | POA: Diagnosis not present

## 2015-05-31 DIAGNOSIS — D72829 Elevated white blood cell count, unspecified: Secondary | ICD-10-CM

## 2015-05-31 DIAGNOSIS — Z5111 Encounter for antineoplastic chemotherapy: Secondary | ICD-10-CM

## 2015-05-31 DIAGNOSIS — Z95828 Presence of other vascular implants and grafts: Secondary | ICD-10-CM

## 2015-05-31 DIAGNOSIS — Z171 Estrogen receptor negative status [ER-]: Secondary | ICD-10-CM

## 2015-05-31 DIAGNOSIS — C773 Secondary and unspecified malignant neoplasm of axilla and upper limb lymph nodes: Secondary | ICD-10-CM | POA: Diagnosis not present

## 2015-05-31 LAB — COMPREHENSIVE METABOLIC PANEL (CC13)
ALBUMIN: 3.6 g/dL (ref 3.5–5.0)
ALT: 13 U/L (ref 0–55)
AST: 20 U/L (ref 5–34)
Alkaline Phosphatase: 70 U/L (ref 40–150)
Anion Gap: 10 mEq/L (ref 3–11)
BUN: 12.9 mg/dL (ref 7.0–26.0)
CALCIUM: 10.7 mg/dL — AB (ref 8.4–10.4)
CHLORIDE: 99 meq/L (ref 98–109)
CO2: 27 mEq/L (ref 22–29)
Creatinine: 1 mg/dL (ref 0.6–1.1)
EGFR: 56 mL/min/{1.73_m2} — ABNORMAL LOW (ref >90)
Glucose: 126 mg/dl (ref 70–140)
POTASSIUM: 3.6 meq/L (ref 3.5–5.1)
SODIUM: 136 meq/L (ref 136–145)
TOTAL PROTEIN: 6.5 g/dL (ref 6.4–8.3)
Total Bilirubin: 0.67 mg/dL (ref 0.20–1.20)

## 2015-05-31 LAB — CBC WITH DIFFERENTIAL/PLATELET
BASO%: 1 % (ref 0.0–2.0)
Basophils Absolute: 0.1 10*3/uL (ref 0.0–0.1)
EOS%: 1.8 % (ref 0.0–7.0)
Eosinophils Absolute: 0.1 10*3/uL (ref 0.0–0.5)
HCT: 29.4 % — ABNORMAL LOW (ref 34.8–46.6)
HGB: 9.9 g/dL — ABNORMAL LOW (ref 11.6–15.9)
LYMPH%: 11.1 % — ABNORMAL LOW (ref 14.0–49.7)
MCH: 29.3 pg (ref 25.1–34.0)
MCHC: 33.7 g/dL (ref 31.5–36.0)
MCV: 87 fL (ref 79.5–101.0)
MONO#: 0.7 10*3/uL (ref 0.1–0.9)
MONO%: 10.1 % (ref 0.0–14.0)
NEUT#: 5.2 10*3/uL (ref 1.5–6.5)
NEUT%: 76 % (ref 38.4–76.8)
Platelets: 383 10*3/uL (ref 145–400)
RBC: 3.38 10*6/uL — AB (ref 3.70–5.45)
RDW: 16.7 % — AB (ref 11.2–14.5)
WBC: 6.9 10*3/uL (ref 3.9–10.3)
lymph#: 0.8 10*3/uL — ABNORMAL LOW (ref 0.9–3.3)

## 2015-05-31 MED ORDER — PACLITAXEL PROTEIN-BOUND CHEMO INJECTION 100 MG
65.0000 mg/m2 | Freq: Once | INTRAVENOUS | Status: AC
  Administered 2015-05-31: 100 mg via INTRAVENOUS
  Filled 2015-05-31: qty 20

## 2015-05-31 MED ORDER — SODIUM CHLORIDE 0.9 % IJ SOLN
10.0000 mL | INTRAMUSCULAR | Status: DC | PRN
  Administered 2015-05-31: 10 mL via INTRAVENOUS
  Filled 2015-05-31: qty 10

## 2015-05-31 MED ORDER — PALONOSETRON HCL INJECTION 0.25 MG/5ML
0.2500 mg | Freq: Once | INTRAVENOUS | Status: AC
  Administered 2015-05-31: 0.25 mg via INTRAVENOUS

## 2015-05-31 MED ORDER — HEPARIN SOD (PORK) LOCK FLUSH 100 UNIT/ML IV SOLN
500.0000 [IU] | Freq: Once | INTRAVENOUS | Status: AC | PRN
  Administered 2015-05-31: 500 [IU]
  Filled 2015-05-31: qty 5

## 2015-05-31 MED ORDER — SODIUM CHLORIDE 0.9 % IV SOLN
Freq: Once | INTRAVENOUS | Status: AC
  Administered 2015-05-31: 14:00:00 via INTRAVENOUS

## 2015-05-31 MED ORDER — SODIUM CHLORIDE 0.9 % IJ SOLN
10.0000 mL | INTRAMUSCULAR | Status: DC | PRN
Start: 2015-05-31 — End: 2015-05-31
  Administered 2015-05-31: 10 mL
  Filled 2015-05-31: qty 10

## 2015-05-31 MED ORDER — PALONOSETRON HCL INJECTION 0.25 MG/5ML
INTRAVENOUS | Status: AC
  Filled 2015-05-31: qty 5

## 2015-05-31 NOTE — Patient Instructions (Signed)

## 2015-05-31 NOTE — Progress Notes (Signed)
Patient Care Team: Thressa Sheller, MD as PCP - General (Internal Medicine) Erroll Luna, MD as Consulting Physician (General Surgery) Nicholas Lose, MD as Consulting Physician (Hematology and Oncology) Gery Pray, MD as Consulting Physician (Radiation Oncology) Mauro Kaufmann, RN as Registered Nurse Rockwell Germany, RN as Registered Nurse  DIAGNOSIS: Breast cancer of upper-outer quadrant of left female breast   Staging form: Breast, AJCC 7th Edition     Clinical stage from 03/07/2015: Stage IIB (T2, N1, M0) - Unsigned       Staging comments: Staged at breast conference on 5.18.16    SUMMARY OF ONCOLOGIC HISTORY:   Breast cancer of upper-outer quadrant of left female breast   02/15/2015 Mammogram Left breast increased density, mass is irregular with microcalcifications measuring 2.9 cm, cyst in the breast as well as abnormal enlarged lymph nodes   02/20/2015 Initial Diagnosis Left breast biopsy: Invasive ductal carcinoma with DCIS, axillary lymph node biopsy positive, ER 0%, PR 0%, HER-2 negative ratio 1.13 with Ki-67 75%   03/15/2015 PET scan 2.8 cm solid irregular left breast mass which is hypermetabolic and consistent with known left breast cancer. 2. Weakly positive axillary lymph nodes or equivocal   03/20/2015 Breast MRI 3.1 cm irregular enhancing mass located within the left breast at the 1 o'clock position; 1.6 cm Left axillary LN   03/22/2015 -  Neo-Adjuvant Chemotherapy Neo-adjuvant chemotherapy with dose dense Adriamycin and Cytoxan 4 followed by Abraxane weekly 12    CHIEF COMPLIANT: Cycle 3 of Abraxane  INTERVAL HISTORY: Carla Bennett is a 80 year old with above-mentioned history of left breast cancer currently on neo-adjuvant chemotherapy with Abraxane. She complains of fatigue and lack of taste. Because of this she is not eating much. And because of that she has been becoming more fatigued. She reports that all foods taste like cardboard. Denies any fevers or chills denies  any nausea vomiting.  REVIEW OF SYSTEMS:   Constitutional: Denies fevers, chills or abnormal weight loss, fatigue Eyes: Denies blurriness of vision Ears, nose, mouth, throat, and face: Denies mucositis or sore throat Respiratory: Denies cough, dyspnea or wheezes Cardiovascular: Denies palpitation, chest discomfort or lower extremity swelling Gastrointestinal:  Denies nausea, heartburn or change in bowel habits Skin: Denies abnormal skin rashes Lymphatics: Denies new lymphadenopathy or easy bruising Neurological:Denies numbness, tingling or new weaknesses Behavioral/Psych: Mood is stable, no new changes  Breast:  denies any pain or lumps or nodules in either breasts All other systems were reviewed with the patient and are negative.  I have reviewed the past medical history, past surgical history, social history and family history with the patient and they are unchanged from previous note.  ALLERGIES:  has No Known Allergies.  MEDICATIONS:  Current Outpatient Prescriptions  Medication Sig Dispense Refill  . Alum & Mag Hydroxide-Simeth (MAGIC MOUTHWASH W/LIDOCAINE) SOLN Take 5 mLs by mouth 3 (three) times daily. 100 mL 0  . B Complex Vitamins (VITAMIN B COMPLEX PO) Take 1 tablet by mouth daily.     Marland Kitchen dexamethasone (DECADRON) 4 MG tablet Take 1 tablet (4 mg total) by mouth 2 (two) times daily. Take 2 tablets by mouth once a day on the day after chemotherapy and then take 2 tablets two times a day for 2 days. Take with food. 30 tablet 1  . estradiol (ESTRACE) 0.5 MG tablet Take 1/2 tablet daily. 45 tablet 4  . furosemide (LASIX) 20 MG tablet Take 20 mg by mouth daily.     Marland Kitchen labetalol (NORMODYNE) 200 MG tablet  Take 200 mg by mouth 2 (two) times daily.  6  . lidocaine-prilocaine (EMLA) cream Apply to port-a-cath 1-2 hours prior to access. 30 g 3  . lisinopril (PRINIVIL,ZESTRIL) 40 MG tablet Take 40 mg by mouth daily.     Marland Kitchen loperamide (IMODIUM A-D) 2 MG tablet Take 2 mg by mouth 4 (four) times  daily as needed for diarrhea or loose stools (diarrhea).    . loratadine (CLARITIN) 10 MG tablet Take 10 mg by mouth daily.    Marland Kitchen LORazepam (ATIVAN) 0.5 MG tablet TAKE 1 TABLET BY MOUTH AT BEDTIME 30 tablet 0  . ondansetron (ZOFRAN) 8 MG tablet     . oxyCODONE-acetaminophen (ROXICET) 5-325 MG per tablet Take 1 tablet by mouth every 4 (four) hours as needed. 30 tablet 0  . prochlorperazine (COMPAZINE) 10 MG tablet TAKE 1 TABLET BY MOUTH EVERY 6 HOURS AS NEEDED FOR NAUSEA AND VOMITING  1   No current facility-administered medications for this visit.    PHYSICAL EXAMINATION: ECOG PERFORMANCE STATUS: 1 - Symptomatic but completely ambulatory  Filed Vitals:   05/31/15 1346  BP: 150/77  Pulse: 102  Temp: 98.4 F (36.9 C)  Resp: 18   Filed Weights   05/31/15 1346  Weight: 129 lb 6.4 oz (58.695 kg)    GENERAL:alert, no distress and comfortable SKIN: skin color, texture, turgor are normal, no rashes or significant lesions EYES: normal, Conjunctiva are pink and non-injected, sclera clear OROPHARYNX:no exudate, no erythema and lips, buccal mucosa, and tongue normal  NECK: supple, thyroid normal size, non-tender, without nodularity LYMPH:  no palpable lymphadenopathy in the cervical, axillary or inguinal LUNGS: clear to auscultation and percussion with normal breathing effort HEART: Tachycardia when she decreased her blood pressure medication ABDOMEN:abdomen soft, non-tender and normal bowel sounds Musculoskeletal:no cyanosis of digits and no clubbing  NEURO: alert & oriented x 3 with fluent speech, no focal motor/sensory deficits, denies any neuropathy  LABORATORY DATA:  I have reviewed the data as listed   Chemistry      Component Value Date/Time   NA 135* 05/24/2015 1252   NA 134* 04/15/2015 1556   K 3.9 05/24/2015 1252   K 3.7 04/15/2015 1556   CL 100* 04/15/2015 1556   CO2 28 05/24/2015 1252   CO2 26 04/15/2015 1556   BUN 10.3 05/24/2015 1252   BUN 10 04/15/2015 1556    CREATININE 1.1 05/24/2015 1252   CREATININE 1.18* 04/15/2015 1556      Component Value Date/Time   CALCIUM 10.5 05/24/2015 1508   CALCIUM 10.9* 05/24/2015 1252   ALKPHOS 74 05/24/2015 1252   ALKPHOS 69 03/16/2015 1133   AST 20 05/24/2015 1252   AST 22 03/16/2015 1133   ALT 15 05/24/2015 1252   ALT 18 03/16/2015 1133   BILITOT 0.76 05/24/2015 1252   BILITOT 0.6 03/16/2015 1133       Lab Results  Component Value Date   WBC 6.9 05/31/2015   HGB 9.9* 05/31/2015   HCT 29.4* 05/31/2015   MCV 87.0 05/31/2015   PLT 383 05/31/2015   NEUTROABS 5.2 05/31/2015    ASSESSMENT & PLAN:  Breast cancer of upper-outer quadrant of left female breast  Left breast invasive ductal carcinoma grade 3 ER 0% PR 0% HER-2 negative Ki-67 75%, 2.9 cm mass with microcalcifications plus abnormal lymph nodes biopsy-proven to be breast cancer T2 N1 M0 stage IIB clinical stage PET-CT: Neg Breast MRI: 3.1 cm left breast mass, 1.6 cm Left Axillary LN  Treatment Plan:  1.  Neoadjuvant chemotherapy: Dose dense Adriamycin and Cytoxan 4 followed by Abraxane weekly 12 2. Consideration for Alliance clinical trial for surgery 3. Followed by adjuvant radiation Goal of treatment: Cure  Current treatment: Abraxane week 3/12 Chemotherapy toxicities: 1. Alopecia 2. Mouth sores : I prescribed Magic mouthwash and decrease the dosage of fourth Adriamycin and Cytoxan 3. Fatigue related to chemotherapy 4. Loss of taste 5. Anemia due to chemotherapy hemoglobin 10.5 today 6. Leucocytosis 7. Lip sore: resolved 8. Weight loss: Due to decreased taste. I discussed at length that she needs to find ways to increase her protein intake in her diet. We discussed several options with her.  Hypertension: When we stopped labetalol her heart rate went into 120s. I discussed with her about resuming half a tablet of labetalol daily.  Return to clinic in 2 weeks for cycle 5/12 Abraxane   No orders of the defined types were  placed in this encounter.   The patient has a good understanding of the overall plan. she agrees with it. she will call with any problems that may develop before the next visit here.   Rulon Eisenmenger, MD

## 2015-05-31 NOTE — Patient Instructions (Signed)
[facility] Discharge Instructions for Patients Receiving Chemotherapy  Today you received the following chemotherapy agents: Abraxane   To help prevent nausea and vomiting after your treatment, we encourage you to take your nausea medication as directed.    If you develop nausea and vomiting that is not controlled by your nausea medication, call the clinic.   BELOW ARE SYMPTOMS THAT SHOULD BE REPORTED IMMEDIATELY:  *FEVER GREATER THAN 100.5 F  *CHILLS WITH OR WITHOUT FEVER  NAUSEA AND VOMITING THAT IS NOT CONTROLLED WITH YOUR NAUSEA MEDICATION  *UNUSUAL SHORTNESS OF BREATH  *UNUSUAL BRUISING OR BLEEDING  TENDERNESS IN MOUTH AND THROAT WITH OR WITHOUT PRESENCE OF ULCERS  *URINARY PROBLEMS  *BOWEL PROBLEMS  UNUSUAL RASH Items with * indicate a potential emergency and should be followed up as soon as possible.  Feel free to call the clinic you have any questions or concerns. The clinic phone number is [phone].  Please show the CHEMO ALERT CARD at check-in to the Emergency Department and triage nurse.   

## 2015-05-31 NOTE — Assessment & Plan Note (Signed)
Breast cancer of upper-outer quadrant of left female breast Left breast invasive ductal carcinoma grade 3 ER 0% PR 0% HER-2 negative Ki-67 75%, 2.9 cm mass with microcalcifications plus abnormal lymph nodes biopsy-proven to be breast cancer T2 N1 M0 stage IIB clinical stage PET-CT: Neg Breast MRI: 3.1 cm left breast mass, 1.6 cm Left Axillary LN  Treatment Plan:  1. Neoadjuvant chemotherapy: Dose dense Adriamycin and Cytoxan 4 followed by Abraxane weekly 12 2. Consideration for Alliance clinical trial for surgery 3. Followed by adjuvant radiation Goal of treatment: Cure  Current treatment: Abraxane week 3/12 Chemotherapy toxicities: 1. Alopecia 2. Mouth sores : I prescribed Magic mouthwash and decrease the dosage of fourth Adriamycin and Cytoxan 3. Fatigue related to chemotherapy 4. Loss of taste 5. Anemia due to chemotherapy hemoglobin 10.5 today 6. Leucocytosis 7. Lip sore: resolved 8. Weight loss: Due to decreased taste. I discussed at length that she needs to find ways to increase her protein intake in her diet. We discussed several options with her.  Return to clinic in 2 weeks for cycle 5/12 Abraxane

## 2015-06-05 ENCOUNTER — Ambulatory Visit: Payer: Medicare Other | Admitting: Nutrition

## 2015-06-05 ENCOUNTER — Telehealth: Payer: Self-pay | Admitting: Hematology and Oncology

## 2015-06-05 ENCOUNTER — Telehealth: Payer: Self-pay | Admitting: *Deleted

## 2015-06-05 ENCOUNTER — Other Ambulatory Visit: Payer: Self-pay | Admitting: *Deleted

## 2015-06-05 DIAGNOSIS — C50412 Malignant neoplasm of upper-outer quadrant of left female breast: Secondary | ICD-10-CM

## 2015-06-05 NOTE — Telephone Encounter (Signed)
ON 06/04/15 PT. ALSO HAD A NOSE BLEED WHICH LASTED TWO TO THREE MINUTES. TODAY PT. IS STILL DIZZINESS. HER TEMPERATURE IS 100. SHE IS "CHILLED" AND WEAK. PT. HAS NASAL CONGESTION WITH OCCASIONAL DARK BROWN MUCUS. SHE IS FORCING FLUIDS AND HER APPETITE HAS IMPROVED. SOME NAUSEA BUT HER ANTIEMETIC HELPS. NO VOMITING OR DIARRHEA. VERBAL ORDER AND READ BACK TO CYNDEE BACON,NP- PT. TO BE SEEN TOMORROW IN THE SYMPTOM MANAGEMENT CLINIC. PT. CAN USE TYLENOL TONIGHT. IF PT.'S CONDITION WORSENS SHE WILL NEED TO GO TO THE EMERGENCY DEPARTMENT FOR EVALUATION. NOTIFIED PT.'S DAUGHTER IN LAW. SHE VOICES UNDERSTANDING. LABS REQUESTED AND A URGENT POF SENT TO SCHEDULING. ALSO CALL AND LEFT MESSAGES ON MELISSA DALE AND AMBER BLEDSOLE'S VOICE MAILS.

## 2015-06-05 NOTE — Progress Notes (Signed)
Family member made appointment for today however needed to cancel secondary to patient not feeling well. Patient is a 80 year old female diagnosed with breast cancer. Family member reports patient has decreased oral intake and fatigue. She complains of taste alterations and poor appetite. Has lost 8 pounds in 3 weeks. Is concerned about high calcium foods secondary to parathyroid abnormality.  I will pull fact sheets for patient and family to pick up when she is here on Thursday.   They will be left at the front desk. Patient can reschedule appointment when she is feeling better.

## 2015-06-05 NOTE — Telephone Encounter (Signed)
Left message for patient re appointment for 8/17.

## 2015-06-06 ENCOUNTER — Other Ambulatory Visit (HOSPITAL_BASED_OUTPATIENT_CLINIC_OR_DEPARTMENT_OTHER): Payer: Medicare Other

## 2015-06-06 ENCOUNTER — Ambulatory Visit (HOSPITAL_BASED_OUTPATIENT_CLINIC_OR_DEPARTMENT_OTHER): Payer: Medicare Other | Admitting: Nurse Practitioner

## 2015-06-06 VITALS — BP 161/81 | HR 101 | Temp 98.2°F | Resp 18 | Wt 129.1 lb

## 2015-06-06 DIAGNOSIS — D6481 Anemia due to antineoplastic chemotherapy: Secondary | ICD-10-CM | POA: Diagnosis not present

## 2015-06-06 DIAGNOSIS — J329 Chronic sinusitis, unspecified: Secondary | ICD-10-CM | POA: Diagnosis not present

## 2015-06-06 DIAGNOSIS — D72828 Other elevated white blood cell count: Secondary | ICD-10-CM

## 2015-06-06 DIAGNOSIS — C50412 Malignant neoplasm of upper-outer quadrant of left female breast: Secondary | ICD-10-CM | POA: Diagnosis present

## 2015-06-06 LAB — COMPREHENSIVE METABOLIC PANEL (CC13)
ALBUMIN: 3.5 g/dL (ref 3.5–5.0)
ALK PHOS: 71 U/L (ref 40–150)
ALT: 13 U/L (ref 0–55)
AST: 21 U/L (ref 5–34)
Anion Gap: 11 mEq/L (ref 3–11)
BILIRUBIN TOTAL: 0.58 mg/dL (ref 0.20–1.20)
BUN: 8.7 mg/dL (ref 7.0–26.0)
CALCIUM: 10.8 mg/dL — AB (ref 8.4–10.4)
CO2: 27 mEq/L (ref 22–29)
CREATININE: 1.1 mg/dL (ref 0.6–1.1)
Chloride: 100 mEq/L (ref 98–109)
EGFR: 50 mL/min/{1.73_m2} — ABNORMAL LOW (ref >90)
Glucose: 131 mg/dl (ref 70–140)
Potassium: 3.7 mEq/L (ref 3.5–5.1)
Sodium: 138 mEq/L (ref 136–145)
TOTAL PROTEIN: 6.5 g/dL (ref 6.4–8.3)

## 2015-06-06 LAB — CBC WITH DIFFERENTIAL/PLATELET
BASO%: 1.3 % (ref 0.0–2.0)
BASOS ABS: 0.1 10*3/uL (ref 0.0–0.1)
EOS%: 2.3 % (ref 0.0–7.0)
Eosinophils Absolute: 0.1 10*3/uL (ref 0.0–0.5)
HEMATOCRIT: 30.2 % — AB (ref 34.8–46.6)
HEMOGLOBIN: 9.8 g/dL — AB (ref 11.6–15.9)
LYMPH#: 0.6 10*3/uL — AB (ref 0.9–3.3)
LYMPH%: 10.2 % — ABNORMAL LOW (ref 14.0–49.7)
MCH: 28.7 pg (ref 25.1–34.0)
MCHC: 32.5 g/dL (ref 31.5–36.0)
MCV: 88.6 fL (ref 79.5–101.0)
MONO#: 0.5 10*3/uL (ref 0.1–0.9)
MONO%: 8.6 % (ref 0.0–14.0)
NEUT%: 77.6 % — ABNORMAL HIGH (ref 38.4–76.8)
NEUTROS ABS: 4.7 10*3/uL (ref 1.5–6.5)
Platelets: 434 10*3/uL — ABNORMAL HIGH (ref 145–400)
RBC: 3.41 10*6/uL — ABNORMAL LOW (ref 3.70–5.45)
RDW: 16.6 % — AB (ref 11.2–14.5)
WBC: 6.1 10*3/uL (ref 3.9–10.3)

## 2015-06-06 LAB — HOLD TUBE, BLOOD BANK

## 2015-06-06 MED ORDER — AMOXICILLIN-POT CLAVULANATE 875-125 MG PO TABS: 1.0000 | ORAL_TABLET | Freq: Two times a day (BID) | ORAL | Status: DC

## 2015-06-07 ENCOUNTER — Other Ambulatory Visit: Payer: Medicare Other

## 2015-06-07 ENCOUNTER — Ambulatory Visit (HOSPITAL_BASED_OUTPATIENT_CLINIC_OR_DEPARTMENT_OTHER): Payer: Medicare Other

## 2015-06-07 ENCOUNTER — Encounter: Payer: Self-pay | Admitting: Nurse Practitioner

## 2015-06-07 ENCOUNTER — Ambulatory Visit: Payer: Medicare Other

## 2015-06-07 VITALS — BP 134/59 | HR 93 | Temp 98.3°F | Resp 20

## 2015-06-07 DIAGNOSIS — C50412 Malignant neoplasm of upper-outer quadrant of left female breast: Secondary | ICD-10-CM | POA: Diagnosis not present

## 2015-06-07 DIAGNOSIS — Z5111 Encounter for antineoplastic chemotherapy: Secondary | ICD-10-CM | POA: Diagnosis not present

## 2015-06-07 DIAGNOSIS — Z95828 Presence of other vascular implants and grafts: Secondary | ICD-10-CM

## 2015-06-07 DIAGNOSIS — J329 Chronic sinusitis, unspecified: Secondary | ICD-10-CM | POA: Insufficient documentation

## 2015-06-07 MED ORDER — PALONOSETRON HCL INJECTION 0.25 MG/5ML
0.2500 mg | Freq: Once | INTRAVENOUS | Status: AC
  Administered 2015-06-07: 0.25 mg via INTRAVENOUS

## 2015-06-07 MED ORDER — PALONOSETRON HCL INJECTION 0.25 MG/5ML
INTRAVENOUS | Status: AC
  Filled 2015-06-07: qty 5

## 2015-06-07 MED ORDER — SODIUM CHLORIDE 0.9 % IV SOLN
Freq: Once | INTRAVENOUS | Status: AC
  Administered 2015-06-07: 15:00:00 via INTRAVENOUS

## 2015-06-07 MED ORDER — HEPARIN SOD (PORK) LOCK FLUSH 100 UNIT/ML IV SOLN
500.0000 [IU] | Freq: Once | INTRAVENOUS | Status: AC | PRN
  Administered 2015-06-07: 500 [IU]
  Filled 2015-06-07: qty 5

## 2015-06-07 MED ORDER — PACLITAXEL PROTEIN-BOUND CHEMO INJECTION 100 MG
65.0000 mg/m2 | Freq: Once | INTRAVENOUS | Status: AC
  Administered 2015-06-07: 100 mg via INTRAVENOUS
  Filled 2015-06-07: qty 20

## 2015-06-07 MED ORDER — SODIUM CHLORIDE 0.9 % IJ SOLN
10.0000 mL | INTRAMUSCULAR | Status: DC | PRN
  Filled 2015-06-07: qty 10

## 2015-06-07 MED ORDER — SODIUM CHLORIDE 0.9 % IJ SOLN
10.0000 mL | INTRAMUSCULAR | Status: DC | PRN
  Administered 2015-06-07: 10 mL
  Filled 2015-06-07: qty 10

## 2015-06-07 MED ORDER — LABETALOL HCL 100 MG PO TABS: 100.0000 mg | ORAL_TABLET | Freq: Every day | ORAL | Status: DC

## 2015-06-07 NOTE — Progress Notes (Signed)
SYMPTOM MANAGEMENT CLINIC   HPI: Carla Bennett 80 y.o. female diagnosed with breast cancer.  Currently undergoing weekly Abraxane chemotherapy.  Patient is complaining of some chronic nasal congestion, a dry/nonproductive cough, some occasional dizziness, and a fever to a maximum of 100.0 within the past few days.  She denies any headache or facial tenderness with palpation.  HPI  ROS  Past Medical History  Diagnosis Date  . Diverticulosis of colon (without mention of hemorrhage)   . Benign neoplasm of colon   . Diverticulitis   . Arthritis   . Gout   . Hypertension   . Allergy   . Other issue of medical certificates     uterine myomata  . Hematuria     negative urology work up  . Hx of abnormal cervical Pap smear     LEEP-CIN I, negative margins and ECC  . Uterine prolaps   . Cystocele   . Basal cell carcinoma   . Hypercalcemia   . Cholelithiasis   . History of blood transfusion     x 1 with D&C  . Renal disease 3/16    Stage 3 kidney disease-seeing Dr Buddy Duty and Dr Marval Regal  . Breast cancer of upper-outer quadrant of left female breast 02/23/2015  . Heart murmur   . Parathyroid abnormality     Past Surgical History  Procedure Laterality Date  . Cystocele repair  08/2010    Rectocele, vault prolapse  . Dilation and curettage of uterus      x2  . Tonsillectomy and adenoidectomy    . Cholecystectomy    . Total vaginal hysterectomy      secondary to prolapse, ovaries not removed  . Portacath placement Right 03/20/2015    Procedure: INSERTION PORT-A-CATH;  Surgeon: Erroll Luna, MD;  Location: [facility];  Service: General;  Laterality: Right;    has Hematochezia; Abdominal pain, other specified site; Breast cancer of upper-outer quadrant of left female breast; Family history of malignant neoplasm of gastrointestinal tract; Family history of malignant neoplasm of breast; Hypercalcemia; and Sinusitis on her problem list.    has No Known Allergies.      Medication List       This list is accurate as of: 06/06/15 11:59 PM.  Always use your most recent med list.               amoxicillin-clavulanate 875-125 MG per tablet  Commonly known as:  AUGMENTIN  Take 1 tablet by mouth 2 (two) times daily.     estradiol 0.5 MG tablet  Commonly known as:  ESTRACE  Take 1/2 tablet daily.     furosemide 20 MG tablet  Commonly known as:  LASIX  Take 20 mg by mouth daily.     labetalol 100 MG tablet  Commonly known as:  NORMODYNE  Take 1 tablet (100 mg total) by mouth daily.     lidocaine-prilocaine cream  Commonly known as:  EMLA  Apply to port-a-cath 1-2 hours prior to access.     lisinopril 40 MG tablet  Commonly known as:  PRINIVIL,ZESTRIL  Take 40 mg by mouth daily.     loperamide 2 MG tablet  Commonly known as:  IMODIUM A-D  Take 2 mg by mouth 4 (four) times daily as needed for diarrhea or loose stools (diarrhea).     loratadine 10 MG tablet  Commonly known as:  CLARITIN  Take 10 mg by mouth daily.     LORazepam 0.5 MG tablet  Commonly  known as:  ATIVAN  TAKE 1 TABLET BY MOUTH AT BEDTIME     magic mouthwash w/lidocaine Soln  Take 5 mLs by mouth 3 (three) times daily.     ondansetron 8 MG tablet  Commonly known as:  ZOFRAN     oxyCODONE-acetaminophen 5-325 MG per tablet  Commonly known as:  ROXICET  Take 1 tablet by mouth every 4 (four) hours as needed.     prochlorperazine 10 MG tablet  Commonly known as:  COMPAZINE  TAKE 1 TABLET BY MOUTH EVERY 6 HOURS AS NEEDED FOR NAUSEA AND VOMITING     VITAMIN B COMPLEX PO  Take 1 tablet by mouth daily.         PHYSICAL EXAMINATION  Oncology Vitals 06/07/2015 06/06/2015 05/31/2015 05/31/2015 05/24/2015 05/17/2015 05/03/2015  Height - - - 155 cm 155 cm 155 cm 155 cm  Weight - 58.559 kg - 58.695 kg 59.784 kg 60.464 kg 63.322 kg  Weight (lbs) - 129 lbs 2 oz - 129 lbs 6 oz 131 lbs 13 oz 133 lbs 5 oz 139 lbs 10 oz  BMI (kg/m2) - - - 24.45 kg/m2 24.9 kg/m2 25.19 kg/m2 26.38 kg/m2   Temp 98.3 98.2 - 98.4 98.7 99.1 98.4  Pulse 93 101 99 102 118 84 79  Resp 20 18 - _0 SpO2 98 97 100 99 - - 99  BSA (m2) - - - 1.59 m2 1.6 m2 1.61 m2 1.65 m2   BP Readings from Last 3 Encounters:  06/07/15 134/59  06/06/15 161/81  05/31/15 150/77    Physical Exam  Constitutional: She is oriented to person, place, and time and well-developed, well-nourished, and in no distress.  HENT:  Head: Normocephalic and atraumatic.  Right Ear: External ear normal.  Left Ear: External ear normal.  Mouth/Throat: Oropharynx is clear and moist.  Patient with moderate nasal congestion on exam.  No facial tenderness with palpation.  Eyes: Conjunctivae and EOM are normal. Pupils are equal, round, and reactive to light.  Neck: Normal range of motion. Neck supple. No JVD present. No tracheal deviation present. No thyromegaly present.  Cardiovascular: Normal rate, regular rhythm, normal heart sounds and intact distal pulses.   Pulmonary/Chest: Effort normal and breath sounds normal. No respiratory distress. She has no wheezes. She has no rales. She exhibits no tenderness.  Abdominal: Soft. Bowel sounds are normal. She exhibits no distension and no mass. There is no tenderness. There is no rebound and no guarding.  Musculoskeletal: Normal range of motion. She exhibits no edema or tenderness.  Lymphadenopathy:    She has no cervical adenopathy.  Neurological: She is alert and oriented to person, place, and time. Gait normal.  Skin: Skin is warm and dry. No rash noted. No erythema. No pallor.  Psychiatric: Affect normal.  Nursing note and vitals reviewed.   LABORATORY DATA:. Appointment on 06/06/2015  Component Date Value Ref Range Status  . WBC 06/06/2015 6.1  3.9 - 10.3 10e3/uL Final  . NEUT# 06/06/2015 4.7  1.5 - 6.5 10e3/uL Final  . HGB 06/06/2015 9.8* 11.6 - 15.9 g/dL Final  . HCT 06/06/2015 30.2* 34.8 - 46.6 % Final  . Platelets 06/06/2015 434* 145 - 400 10e3/uL Final  . MCV  06/06/2015 88.6  79.5 - 101.0 fL Final  . MCH 06/06/2015 28.7  25.1 - 34.0 pg Final  . MCHC 06/06/2015 32.5  31.5 - 36.0 g/dL Final  . RBC 06/06/2015 3.41* 3.70 - 5.45 10e6/uL Final  . RDW 06/06/2015  16.6* 11.2 - 14.5 % Final  . lymph# 06/06/2015 0.6* 0.9 - 3.3 10e3/uL Final  . MONO# 06/06/2015 0.5  0.1 - 0.9 10e3/uL Final  . Eosinophils Absolute 06/06/2015 0.1  0.0 - 0.5 10e3/uL Final  . Basophils Absolute 06/06/2015 0.1  0.0 - 0.1 10e3/uL Final  . NEUT% 06/06/2015 77.6* 38.4 - 76.8 % Final  . LYMPH% 06/06/2015 10.2* 14.0 - 49.7 % Final  . MONO% 06/06/2015 8.6  0.0 - 14.0 % Final  . EOS% 06/06/2015 2.3  0.0 - 7.0 % Final  . BASO% 06/06/2015 1.3  0.0 - 2.0 % Final  . Sodium 06/06/2015 138  136 - 145 mEq/L Final  . Potassium 06/06/2015 3.7  3.5 - 5.1 mEq/L Final  . Chloride 06/06/2015 100  98 - 109 mEq/L Final  . CO2 06/06/2015 27  22 - 29 mEq/L Final  . Glucose 06/06/2015 131  70 - 140 mg/dl Final  . BUN 06/06/2015 8.7  7.0 - 26.0 mg/dL Final  . Creatinine 06/06/2015 1.1  0.6 - 1.1 mg/dL Final  . Total Bilirubin 06/06/2015 0.58  0.20 - 1.20 mg/dL Final  . Alkaline Phosphatase 06/06/2015 71  40 - 150 U/L Final  . AST 06/06/2015 21  5 - 34 U/L Final  . ALT 06/06/2015 13  0 - 55 U/L Final  . Total Protein 06/06/2015 6.5  6.4 - 8.3 g/dL Final  . Albumin 06/06/2015 3.5  3.5 - 5.0 g/dL Final  . Calcium 06/06/2015 10.8* 8.4 - 10.4 mg/dL Final  . Anion Gap 06/06/2015 11  3 - 11 mEq/L Final  . EGFR 06/06/2015 50* >90 ml/min/1.73 m2 Final   eGFR is calculated using the CKD-EPI Creatinine Equation (2009)  . Hold Tube, Blood Bank 06/06/2015 Blood Bank Order Cancelled   Final     RADIOGRAPHIC STUDIES: No results found.  ASSESSMENT/PLAN:    Breast cancer of upper-outer quadrant of left female breast Patient last received weekly Abraxane chemotherapy on 05/31/2015.  Blood counts obtained today were essentially stable.  Patient is complaining of some mild sinusitis-type symptoms; and will  treat with Augmentin anti-biotics.  However, patient appears nontoxic-and wants to proceed with chemotherapy planned for tomorrow 06/07/2015.    Sinusitis Patient is complaining of some chronic nasal congestion, a dry/nonproductive cough, some occasional dizziness, and a fever to a maximum of 100.0 within the past few days.  She denies any headache or facial tenderness with palpation.  On exam.  It does appear the patient is fairly congested.  Bilateral TMs intact with no fullness.  No facial tenderness on exam.  Oropharynx clear with no exudate.  Vital signs stable.  On exam; with temperature 98.2.  Neuro exam intact.  Will treat patient for mild sinusitis symptoms with Augmentin anti-biotic.  Patient was advised to finish all of antibiotic; even if she starts to feel better.  She may also try some over-the-counter Mucinex as well.    Patient stated understanding of all instructions; and was in agreement with this plan of care. The patient knows to call the clinic with any problems, questions or concerns.   Review/collaboration with Dr. Lindi Adie regarding all aspects of patient's visit today.   Total time spent with patient was 25 minutes;  with greater than 75 percent of that time spent in face to face counseling regarding patient's symptoms,  and coordination of care and follow up.  Disclaimer: This note was dictated with voice recognition software. Similar sounding words can inadvertently be transcribed and may not be corrected  upon review.   Drue Second, NP 06/07/2015

## 2015-06-07 NOTE — Patient Instructions (Signed)
[facility] Discharge Instructions for Patients Receiving Chemotherapy  Today you received the following chemotherapy agents: Abraxane   To help prevent nausea and vomiting after your treatment, we encourage you to take your nausea medication as directed.    If you develop nausea and vomiting that is not controlled by your nausea medication, call the clinic.   BELOW ARE SYMPTOMS THAT SHOULD BE REPORTED IMMEDIATELY:  *FEVER GREATER THAN 100.5 F  *CHILLS WITH OR WITHOUT FEVER  NAUSEA AND VOMITING THAT IS NOT CONTROLLED WITH YOUR NAUSEA MEDICATION  *UNUSUAL SHORTNESS OF BREATH  *UNUSUAL BRUISING OR BLEEDING  TENDERNESS IN MOUTH AND THROAT WITH OR WITHOUT PRESENCE OF ULCERS  *URINARY PROBLEMS  *BOWEL PROBLEMS  UNUSUAL RASH Items with * indicate a potential emergency and should be followed up as soon as possible.  Feel free to call the clinic you have any questions or concerns. The clinic phone number is [phone].  Please show the CHEMO ALERT CARD at check-in to the Emergency Department and triage nurse.   

## 2015-06-07 NOTE — Assessment & Plan Note (Signed)
Patient last received weekly Abraxane chemotherapy on 05/31/2015.  Blood counts obtained today were essentially stable.  Patient is complaining of some mild sinusitis-type symptoms; and will treat with Augmentin anti-biotics.  However, patient appears nontoxic-and wants to proceed with chemotherapy planned for tomorrow 06/07/2015.

## 2015-06-07 NOTE — Assessment & Plan Note (Addendum)
Patient is complaining of some chronic nasal congestion, a dry/nonproductive cough, some occasional dizziness, and a fever to a maximum of 100.0 within the past few days.  She denies any headache or facial tenderness with palpation.  On exam.  It does appear the patient is fairly congested.  Bilateral TMs intact with no fullness.  No facial tenderness on exam.  Oropharynx clear with no exudate.  Vital signs stable.  On exam; with temperature 98.2.  Neuro exam intact.  Will treat patient for mild sinusitis symptoms with Augmentin anti-biotic.  Patient was advised to finish all of antibiotic; even if she starts to feel better.  She may also try some over-the-counter Mucinex as well.

## 2015-06-07 NOTE — Progress Notes (Signed)
Labs done yesterday when seeing symptom management. Per Selena Lesser, NP no labs needed before tx today.

## 2015-06-07 NOTE — Patient Instructions (Signed)

## 2015-06-11 ENCOUNTER — Encounter: Payer: Self-pay | Admitting: Nurse Practitioner

## 2015-06-14 ENCOUNTER — Other Ambulatory Visit: Payer: Medicare Other

## 2015-06-14 ENCOUNTER — Ambulatory Visit (HOSPITAL_BASED_OUTPATIENT_CLINIC_OR_DEPARTMENT_OTHER): Payer: Medicare Other

## 2015-06-14 ENCOUNTER — Encounter: Payer: Self-pay | Admitting: Hematology and Oncology

## 2015-06-14 ENCOUNTER — Ambulatory Visit: Payer: Medicare Other

## 2015-06-14 ENCOUNTER — Ambulatory Visit (HOSPITAL_BASED_OUTPATIENT_CLINIC_OR_DEPARTMENT_OTHER): Payer: Medicare Other | Admitting: Hematology and Oncology

## 2015-06-14 ENCOUNTER — Other Ambulatory Visit (HOSPITAL_BASED_OUTPATIENT_CLINIC_OR_DEPARTMENT_OTHER): Payer: Medicare Other

## 2015-06-14 VITALS — BP 156/61 | HR 94 | Temp 98.1°F | Resp 18 | Ht 61.0 in | Wt 129.0 lb

## 2015-06-14 DIAGNOSIS — D6481 Anemia due to antineoplastic chemotherapy: Secondary | ICD-10-CM

## 2015-06-14 DIAGNOSIS — Z95828 Presence of other vascular implants and grafts: Secondary | ICD-10-CM

## 2015-06-14 DIAGNOSIS — C773 Secondary and unspecified malignant neoplasm of axilla and upper limb lymph nodes: Secondary | ICD-10-CM | POA: Diagnosis not present

## 2015-06-14 DIAGNOSIS — Z171 Estrogen receptor negative status [ER-]: Secondary | ICD-10-CM

## 2015-06-14 DIAGNOSIS — C50412 Malignant neoplasm of upper-outer quadrant of left female breast: Secondary | ICD-10-CM | POA: Diagnosis not present

## 2015-06-14 DIAGNOSIS — D72829 Elevated white blood cell count, unspecified: Secondary | ICD-10-CM | POA: Diagnosis not present

## 2015-06-14 DIAGNOSIS — Z5111 Encounter for antineoplastic chemotherapy: Secondary | ICD-10-CM | POA: Diagnosis present

## 2015-06-14 LAB — COMPREHENSIVE METABOLIC PANEL (CC13)
ALBUMIN: 3.3 g/dL — AB (ref 3.5–5.0)
ALK PHOS: 71 U/L (ref 40–150)
ALT: 15 U/L (ref 0–55)
ANION GAP: 10 meq/L (ref 3–11)
AST: 23 U/L (ref 5–34)
BUN: 10.6 mg/dL (ref 7.0–26.0)
CALCIUM: 10.9 mg/dL — AB (ref 8.4–10.4)
CO2: 25 mEq/L (ref 22–29)
Chloride: 104 mEq/L (ref 98–109)
Creatinine: 1 mg/dL (ref 0.6–1.1)
EGFR: 57 mL/min/{1.73_m2} — AB (ref >90)
Glucose: 114 mg/dl (ref 70–140)
POTASSIUM: 3.7 meq/L (ref 3.5–5.1)
Sodium: 138 mEq/L (ref 136–145)
Total Bilirubin: 0.52 mg/dL (ref 0.20–1.20)
Total Protein: 6.3 g/dL — ABNORMAL LOW (ref 6.4–8.3)

## 2015-06-14 LAB — CBC WITH DIFFERENTIAL/PLATELET
BASO%: 0.9 % (ref 0.0–2.0)
BASOS ABS: 0.1 10*3/uL (ref 0.0–0.1)
EOS ABS: 0.1 10*3/uL (ref 0.0–0.5)
EOS%: 1.6 % (ref 0.0–7.0)
HEMATOCRIT: 28.7 % — AB (ref 34.8–46.6)
HEMOGLOBIN: 9.3 g/dL — AB (ref 11.6–15.9)
LYMPH#: 0.8 10*3/uL — AB (ref 0.9–3.3)
LYMPH%: 11.4 % — ABNORMAL LOW (ref 14.0–49.7)
MCH: 28.8 pg (ref 25.1–34.0)
MCHC: 32.4 g/dL (ref 31.5–36.0)
MCV: 88.9 fL (ref 79.5–101.0)
MONO#: 0.5 10*3/uL (ref 0.1–0.9)
MONO%: 7.9 % (ref 0.0–14.0)
NEUT#: 5.2 10*3/uL (ref 1.5–6.5)
NEUT%: 78.2 % — AB (ref 38.4–76.8)
PLATELETS: 334 10*3/uL (ref 145–400)
RBC: 3.23 10*6/uL — ABNORMAL LOW (ref 3.70–5.45)
RDW: 16.6 % — AB (ref 11.2–14.5)
WBC: 6.7 10*3/uL (ref 3.9–10.3)

## 2015-06-14 MED ORDER — SODIUM CHLORIDE 0.9 % IJ SOLN
10.0000 mL | INTRAMUSCULAR | Status: DC | PRN
  Administered 2015-06-14: 10 mL
  Filled 2015-06-14: qty 10

## 2015-06-14 MED ORDER — PACLITAXEL PROTEIN-BOUND CHEMO INJECTION 100 MG
65.0000 mg/m2 | Freq: Once | INTRAVENOUS | Status: AC
  Administered 2015-06-14: 100 mg via INTRAVENOUS
  Filled 2015-06-14: qty 20

## 2015-06-14 MED ORDER — HEPARIN SOD (PORK) LOCK FLUSH 100 UNIT/ML IV SOLN
500.0000 [IU] | Freq: Once | INTRAVENOUS | Status: AC | PRN
  Administered 2015-06-14: 500 [IU]
  Filled 2015-06-14: qty 5

## 2015-06-14 MED ORDER — PALONOSETRON HCL INJECTION 0.25 MG/5ML
INTRAVENOUS | Status: AC
  Filled 2015-06-14: qty 5

## 2015-06-14 MED ORDER — PALONOSETRON HCL INJECTION 0.25 MG/5ML
0.2500 mg | Freq: Once | INTRAVENOUS | Status: AC
  Administered 2015-06-14: 0.25 mg via INTRAVENOUS

## 2015-06-14 MED ORDER — SODIUM CHLORIDE 0.9 % IV SOLN
Freq: Once | INTRAVENOUS | Status: AC
  Administered 2015-06-14: 14:00:00 via INTRAVENOUS

## 2015-06-14 MED ORDER — SODIUM CHLORIDE 0.9 % IJ SOLN
10.0000 mL | INTRAMUSCULAR | Status: DC | PRN
  Administered 2015-06-14: 10 mL via INTRAVENOUS
  Filled 2015-06-14: qty 10

## 2015-06-14 NOTE — Patient Instructions (Signed)
[facility] Discharge Instructions for Patients Receiving Chemotherapy  Today you received the following chemotherapy agents: Abraxane   To help prevent nausea and vomiting after your treatment, we encourage you to take your nausea medication as directed.    If you develop nausea and vomiting that is not controlled by your nausea medication, call the clinic.   BELOW ARE SYMPTOMS THAT SHOULD BE REPORTED IMMEDIATELY:  *FEVER GREATER THAN 100.5 F  *CHILLS WITH OR WITHOUT FEVER  NAUSEA AND VOMITING THAT IS NOT CONTROLLED WITH YOUR NAUSEA MEDICATION  *UNUSUAL SHORTNESS OF BREATH  *UNUSUAL BRUISING OR BLEEDING  TENDERNESS IN MOUTH AND THROAT WITH OR WITHOUT PRESENCE OF ULCERS  *URINARY PROBLEMS  *BOWEL PROBLEMS  UNUSUAL RASH Items with * indicate a potential emergency and should be followed up as soon as possible.  Feel free to call the clinic you have any questions or concerns. The clinic phone number is [phone].  Please show the CHEMO ALERT CARD at check-in to the Emergency Department and triage nurse.   

## 2015-06-14 NOTE — Assessment & Plan Note (Signed)
Left breast invasive ductal carcinoma grade 3 ER 0% PR 0% HER-2 negative Ki-67 75%, 2.9 cm mass with microcalcifications plus abnormal lymph nodes biopsy-proven to be breast cancer T2 N1 M0 stage IIB clinical stage PET-CT: Neg Breast MRI: 3.1 cm left breast mass, 1.6 cm Left Axillary LN  Treatment Plan:  1. Neoadjuvant chemotherapy: Dose dense Adriamycin and Cytoxan 4 followed by Abraxane weekly 12 2. Consideration for Alliance clinical trial for surgery 3. Followed by adjuvant radiation Goal of treatment: Cure  Current treatment: Abraxane week 5/12 Chemotherapy toxicities: 1. Alopecia 2. Mouth sores : I prescribed Magic mouthwash and decrease the dosage of fourth Adriamycin and Cytoxan 3. Fatigue related to chemotherapy 4. Loss of taste 5. Anemia due to chemotherapy hemoglobin 10.5 today 6. Leucocytosis 7. Lip sore: resolved 8. Weight loss: Due to decreased taste. I discussed at length that she needs to find ways to increase her protein intake in her diet. We discussed several options with her.  Hypertension: When we stopped labetalol her heart rate went into 120s. I discussed with her about resuming half a tablet of labetalol daily.  Return to clinic in 2 weeks for cycle 7/12 Abraxane

## 2015-06-14 NOTE — Progress Notes (Signed)
Patient Care Team: Thressa Sheller, MD as PCP - General (Internal Medicine) Erroll Luna, MD as Consulting Physician (General Surgery) Nicholas Lose, MD as Consulting Physician (Hematology and Oncology) Gery Pray, MD as Consulting Physician (Radiation Oncology) Mauro Kaufmann, RN as Registered Nurse Rockwell Germany, RN as Registered Nurse  DIAGNOSIS: Breast cancer of upper-outer quadrant of left female breast   Staging form: Breast, AJCC 7th Edition     Clinical stage from 03/07/2015: Stage IIB (T2, N1, M0) - Unsigned       Staging comments: Staged at breast conference on 5.18.16    SUMMARY OF ONCOLOGIC HISTORY:   Breast cancer of upper-outer quadrant of left female breast   02/15/2015 Mammogram Left breast increased density, mass is irregular with microcalcifications measuring 2.9 cm, cyst in the breast as well as abnormal enlarged lymph nodes   02/20/2015 Initial Diagnosis Left breast biopsy: Invasive ductal carcinoma with DCIS, axillary lymph node biopsy positive, ER 0%, PR 0%, HER-2 negative ratio 1.13 with Ki-67 75%   03/15/2015 PET scan 2.8 cm solid irregular left breast mass which is hypermetabolic and consistent with known left breast cancer. 2. Weakly positive axillary lymph nodes or equivocal   03/20/2015 Breast MRI 3.1 cm irregular enhancing mass located within the left breast at the 1 o'clock position; 1.6 cm Left axillary LN   03/22/2015 -  Neo-Adjuvant Chemotherapy Neo-adjuvant chemotherapy with dose dense Adriamycin and Cytoxan 4 followed by Abraxane weekly 12    CHIEF COMPLIANT: Cycle 5/12 Abraxane  INTERVAL HISTORY: Carla Bennett is a 80 year old with above-mentioned history of left breast cancer currently on neo-adjuvant chemotherapy and today's cycle 5 of Abraxane. Her major complaint is chronic fatigue related to chemotherapy. She has poor taste and appetite but is still trying to eat 3 meals a day. She denies any fevers or chills. She complains of runny nose and  occasionally blood in the nose. Denies any neuropathy.  REVIEW OF SYSTEMS:   Constitutional: Denies fevers, chills or abnormal weight loss, complains of severe fatigue Eyes: Denies blurriness of vision Ears, nose, mouth, throat, and face: Denies mucositis or sore throat Respiratory: Denies cough, dyspnea or wheezes Cardiovascular: Denies palpitation, chest discomfort or lower extremity swelling Gastrointestinal:  Denies nausea, heartburn or change in bowel habits Skin: Denies abnormal skin rashes Lymphatics: Denies new lymphadenopathy or easy bruising Neurological:Denies numbness, tingling or new weaknesses Behavioral/Psych: Mood is stable, no new changes  Breast:  denies any pain or lumps or nodules in either breasts All other systems were reviewed with the patient and are negative.  I have reviewed the past medical history, past surgical history, social history and family history with the patient and they are unchanged from previous note.  ALLERGIES:  has No Known Allergies.  MEDICATIONS:  Current Outpatient Prescriptions  Medication Sig Dispense Refill  . Alum & Mag Hydroxide-Simeth (MAGIC MOUTHWASH W/LIDOCAINE) SOLN Take 5 mLs by mouth 3 (three) times daily. 100 mL 0  . B Complex Vitamins (VITAMIN B COMPLEX PO) Take 1 tablet by mouth daily.     Marland Kitchen dexamethasone (DECADRON) 4 MG tablet     . estradiol (ESTRACE) 0.5 MG tablet Take 1/2 tablet daily. 45 tablet 4  . furosemide (LASIX) 20 MG tablet Take 20 mg by mouth daily.     Marland Kitchen labetalol (NORMODYNE) 100 MG tablet Take 1 tablet (100 mg total) by mouth daily. 30 tablet 0  . lidocaine-prilocaine (EMLA) cream Apply to port-a-cath 1-2 hours prior to access. 30 g 3  . lisinopril (PRINIVIL,ZESTRIL)  40 MG tablet Take 40 mg by mouth daily.     Marland Kitchen loperamide (IMODIUM A-D) 2 MG tablet Take 2 mg by mouth 4 (four) times daily as needed for diarrhea or loose stools (diarrhea).    . loratadine (CLARITIN) 10 MG tablet Take 10 mg by mouth daily.    Marland Kitchen  LORazepam (ATIVAN) 0.5 MG tablet TAKE 1 TABLET BY MOUTH AT BEDTIME 30 tablet 0  . ondansetron (ZOFRAN) 8 MG tablet     . oxyCODONE-acetaminophen (ROXICET) 5-325 MG per tablet Take 1 tablet by mouth every 4 (four) hours as needed. 30 tablet 0  . prochlorperazine (COMPAZINE) 10 MG tablet TAKE 1 TABLET BY MOUTH EVERY 6 HOURS AS NEEDED FOR NAUSEA AND VOMITING  1   No current facility-administered medications for this visit.   Facility-Administered Medications Ordered in Other Visits  Medication Dose Route Frequency Provider Last Rate Last Dose  . sodium chloride 0.9 % injection 10 mL  10 mL Intracatheter PRN Nicholas Lose, MD   10 mL at 06/07/15 1559    PHYSICAL EXAMINATION: ECOG PERFORMANCE STATUS: 1 - Symptomatic but completely ambulatory  Filed Vitals:   06/14/15 1307  BP: 156/61  Pulse: 94  Temp: 98.1 F (36.7 C)  Resp: 18   Filed Weights   06/14/15 1307  Weight: 129 lb (58.514 kg)    GENERAL:alert, no distress and comfortable SKIN: skin color, texture, turgor are normal, no rashes or significant lesions EYES: normal, Conjunctiva are pink and non-injected, sclera clear OROPHARYNX:no exudate, no erythema and lips, buccal mucosa, and tongue normal  NECK: supple, thyroid normal size, non-tender, without nodularity LYMPH:  no palpable lymphadenopathy in the cervical, axillary or inguinal LUNGS: clear to auscultation and percussion with normal breathing effort HEART: regular rate & rhythm and no murmurs and no lower extremity edema ABDOMEN:abdomen soft, non-tender and normal bowel sounds Musculoskeletal:no cyanosis of digits and no clubbing  NEURO: alert & oriented x 3 with fluent speech, no focal motor/sensory deficits   LABORATORY DATA:  I have reviewed the data as listed   Chemistry      Component Value Date/Time   NA 138 06/06/2015 0923   NA 134* 04/15/2015 1556   K 3.7 06/06/2015 0923   K 3.7 04/15/2015 1556   CL 100* 04/15/2015 1556   CO2 27 06/06/2015 0923   CO2  26 04/15/2015 1556   BUN 8.7 06/06/2015 0923   BUN 10 04/15/2015 1556   CREATININE 1.1 06/06/2015 0923   CREATININE 1.18* 04/15/2015 1556      Component Value Date/Time   CALCIUM 10.8* 06/06/2015 0923   CALCIUM 10.5 05/24/2015 1508   ALKPHOS 71 06/06/2015 0923   ALKPHOS 69 03/16/2015 1133   AST 21 06/06/2015 0923   AST 22 03/16/2015 1133   ALT 13 06/06/2015 0923   ALT 18 03/16/2015 1133   BILITOT 0.58 06/06/2015 0923   BILITOT 0.6 03/16/2015 1133       Lab Results  Component Value Date   WBC 6.7 06/14/2015   HGB 9.3* 06/14/2015   HCT 28.7* 06/14/2015   MCV 88.9 06/14/2015   PLT 334 06/14/2015   NEUTROABS 5.2 06/14/2015   ASSESSMENT & PLAN:  Breast cancer of upper-outer quadrant of left female breast Left breast invasive ductal carcinoma grade 3 ER 0% PR 0% HER-2 negative Ki-67 75%, 2.9 cm mass with microcalcifications plus abnormal lymph nodes biopsy-proven to be breast cancer T2 N1 M0 stage IIB clinical stage PET-CT: Neg Breast MRI: 3.1 cm left breast mass, 1.6  cm Left Axillary LN  Treatment Plan:  1. Neoadjuvant chemotherapy: Dose dense Adriamycin and Cytoxan 4 followed by Abraxane weekly 12 2. Consideration for Alliance clinical trial for surgery 3. Followed by adjuvant radiation Goal of treatment: Cure  Current treatment: Abraxane week 5/12 Chemotherapy toxicities: 1. Alopecia 2. Mouth sores : I prescribed Magic mouthwash and decrease the dosage of fourth Adriamycin and Cytoxan 3. Fatigue related to chemotherapy 4. Loss of taste 5. Anemia due to chemotherapy hemoglobin 10.5 today 6. Leucocytosis 7. Lip sore: resolved 8. Weight loss: Due to decreased taste. I discussed at length that she needs to find ways to increase her protein intake in her diet. We discussed several options with her.  Hypertension: When we stopped labetalol her heart rate went into 120s. I discussed with her about resuming half a tablet of labetalol daily.  Return to clinic in 2  weeks for cycle 7/12 Abraxane   No orders of the defined types were placed in this encounter.   The patient has a good understanding of the overall plan. she agrees with it. she will call with any problems that may develop before the next visit here.   Rulon Eisenmenger, MD

## 2015-06-21 ENCOUNTER — Ambulatory Visit: Payer: Medicare Other

## 2015-06-21 ENCOUNTER — Ambulatory Visit (HOSPITAL_BASED_OUTPATIENT_CLINIC_OR_DEPARTMENT_OTHER): Payer: Medicare Other

## 2015-06-21 ENCOUNTER — Other Ambulatory Visit: Payer: Medicare Other

## 2015-06-21 ENCOUNTER — Other Ambulatory Visit (HOSPITAL_BASED_OUTPATIENT_CLINIC_OR_DEPARTMENT_OTHER): Payer: Medicare Other

## 2015-06-21 VITALS — BP 168/69 | HR 96 | Temp 97.6°F | Resp 18 | Wt 128.5 lb

## 2015-06-21 DIAGNOSIS — Z95828 Presence of other vascular implants and grafts: Secondary | ICD-10-CM

## 2015-06-21 DIAGNOSIS — Z5111 Encounter for antineoplastic chemotherapy: Secondary | ICD-10-CM | POA: Diagnosis not present

## 2015-06-21 DIAGNOSIS — D72828 Other elevated white blood cell count: Secondary | ICD-10-CM | POA: Diagnosis not present

## 2015-06-21 DIAGNOSIS — D6481 Anemia due to antineoplastic chemotherapy: Secondary | ICD-10-CM

## 2015-06-21 DIAGNOSIS — C50412 Malignant neoplasm of upper-outer quadrant of left female breast: Secondary | ICD-10-CM | POA: Diagnosis not present

## 2015-06-21 LAB — CBC WITH DIFFERENTIAL/PLATELET
BASO%: 1 % (ref 0.0–2.0)
BASOS ABS: 0.1 10*3/uL (ref 0.0–0.1)
EOS%: 2.2 % (ref 0.0–7.0)
Eosinophils Absolute: 0.2 10*3/uL (ref 0.0–0.5)
HEMATOCRIT: 30.5 % — AB (ref 34.8–46.6)
HEMOGLOBIN: 10 g/dL — AB (ref 11.6–15.9)
LYMPH#: 1 10*3/uL (ref 0.9–3.3)
LYMPH%: 12.7 % — ABNORMAL LOW (ref 14.0–49.7)
MCH: 28.9 pg (ref 25.1–34.0)
MCHC: 32.9 g/dL (ref 31.5–36.0)
MCV: 87.9 fL (ref 79.5–101.0)
MONO#: 0.7 10*3/uL (ref 0.1–0.9)
MONO%: 8.9 % (ref 0.0–14.0)
NEUT#: 5.7 10*3/uL (ref 1.5–6.5)
NEUT%: 75.2 % (ref 38.4–76.8)
Platelets: 411 10*3/uL — ABNORMAL HIGH (ref 145–400)
RBC: 3.46 10*6/uL — ABNORMAL LOW (ref 3.70–5.45)
RDW: 18 % — AB (ref 11.2–14.5)
WBC: 7.6 10*3/uL (ref 3.9–10.3)

## 2015-06-21 LAB — COMPREHENSIVE METABOLIC PANEL (CC13)
ALBUMIN: 3.5 g/dL (ref 3.5–5.0)
ALT: 13 U/L (ref 0–55)
AST: 23 U/L (ref 5–34)
Alkaline Phosphatase: 73 U/L (ref 40–150)
Anion Gap: 9 mEq/L (ref 3–11)
BUN: 12.5 mg/dL (ref 7.0–26.0)
CALCIUM: 10.8 mg/dL — AB (ref 8.4–10.4)
CHLORIDE: 104 meq/L (ref 98–109)
CO2: 28 mEq/L (ref 22–29)
CREATININE: 0.9 mg/dL (ref 0.6–1.1)
EGFR: 60 mL/min/{1.73_m2} — ABNORMAL LOW (ref >90)
GLUCOSE: 108 mg/dL (ref 70–140)
POTASSIUM: 3.4 meq/L — AB (ref 3.5–5.1)
SODIUM: 141 meq/L (ref 136–145)
Total Bilirubin: 0.57 mg/dL (ref 0.20–1.20)
Total Protein: 6.3 g/dL — ABNORMAL LOW (ref 6.4–8.3)

## 2015-06-21 MED ORDER — HEPARIN SOD (PORK) LOCK FLUSH 100 UNIT/ML IV SOLN
500.0000 [IU] | Freq: Once | INTRAVENOUS | Status: AC | PRN
  Administered 2015-06-21: 500 [IU]
  Filled 2015-06-21: qty 5

## 2015-06-21 MED ORDER — SODIUM CHLORIDE 0.9 % IJ SOLN
10.0000 mL | INTRAMUSCULAR | Status: DC | PRN
  Administered 2015-06-21: 10 mL via INTRAVENOUS
  Filled 2015-06-21: qty 10

## 2015-06-21 MED ORDER — PACLITAXEL PROTEIN-BOUND CHEMO INJECTION 100 MG
65.0000 mg/m2 | Freq: Once | INTRAVENOUS | Status: AC
  Administered 2015-06-21: 100 mg via INTRAVENOUS
  Filled 2015-06-21: qty 20

## 2015-06-21 MED ORDER — SODIUM CHLORIDE 0.9 % IJ SOLN
10.0000 mL | INTRAMUSCULAR | Status: DC | PRN
  Administered 2015-06-21: 10 mL
  Filled 2015-06-21: qty 10

## 2015-06-21 MED ORDER — SODIUM CHLORIDE 0.9 % IV SOLN
Freq: Once | INTRAVENOUS | Status: AC
  Administered 2015-06-21: 14:00:00 via INTRAVENOUS

## 2015-06-21 MED ORDER — PALONOSETRON HCL INJECTION 0.25 MG/5ML
0.2500 mg | Freq: Once | INTRAVENOUS | Status: AC
  Administered 2015-06-21: 0.25 mg via INTRAVENOUS

## 2015-06-21 MED ORDER — PALONOSETRON HCL INJECTION 0.25 MG/5ML
INTRAVENOUS | Status: AC
  Filled 2015-06-21: qty 5

## 2015-06-21 NOTE — Patient Instructions (Signed)
[facility] Discharge Instructions for Patients Receiving Chemotherapy  Today you received the following chemotherapy agents: Abraxane   To help prevent nausea and vomiting after your treatment, we encourage you to take your nausea medication as directed.    If you develop nausea and vomiting that is not controlled by your nausea medication, call the clinic.   BELOW ARE SYMPTOMS THAT SHOULD BE REPORTED IMMEDIATELY:  *FEVER GREATER THAN 100.5 F  *CHILLS WITH OR WITHOUT FEVER  NAUSEA AND VOMITING THAT IS NOT CONTROLLED WITH YOUR NAUSEA MEDICATION  *UNUSUAL SHORTNESS OF BREATH  *UNUSUAL BRUISING OR BLEEDING  TENDERNESS IN MOUTH AND THROAT WITH OR WITHOUT PRESENCE OF ULCERS  *URINARY PROBLEMS  *BOWEL PROBLEMS  UNUSUAL RASH Items with * indicate a potential emergency and should be followed up as soon as possible.  Feel free to call the clinic you have any questions or concerns. The clinic phone number is [phone].  Please show the CHEMO ALERT CARD at check-in to the Emergency Department and triage nurse.   

## 2015-06-21 NOTE — Patient Instructions (Signed)

## 2015-06-21 NOTE — Progress Notes (Signed)
Potassium 3.4. Carla Bennett, Dr. Geralyn Flash nurse aware, okay to proceed with treatment.

## 2015-06-28 ENCOUNTER — Encounter: Payer: Self-pay | Admitting: Nurse Practitioner

## 2015-06-28 ENCOUNTER — Ambulatory Visit: Payer: Medicare Other

## 2015-06-28 ENCOUNTER — Other Ambulatory Visit (HOSPITAL_BASED_OUTPATIENT_CLINIC_OR_DEPARTMENT_OTHER): Payer: Medicare Other

## 2015-06-28 ENCOUNTER — Telehealth: Payer: Self-pay | Admitting: Nurse Practitioner

## 2015-06-28 ENCOUNTER — Other Ambulatory Visit: Payer: Medicare Other

## 2015-06-28 ENCOUNTER — Ambulatory Visit (HOSPITAL_BASED_OUTPATIENT_CLINIC_OR_DEPARTMENT_OTHER): Payer: Medicare Other | Admitting: Nurse Practitioner

## 2015-06-28 ENCOUNTER — Ambulatory Visit (HOSPITAL_BASED_OUTPATIENT_CLINIC_OR_DEPARTMENT_OTHER): Payer: Medicare Other

## 2015-06-28 VITALS — BP 155/70 | HR 88 | Temp 98.0°F | Resp 18 | Ht 61.0 in | Wt 129.5 lb

## 2015-06-28 DIAGNOSIS — D72829 Elevated white blood cell count, unspecified: Secondary | ICD-10-CM

## 2015-06-28 DIAGNOSIS — Z5111 Encounter for antineoplastic chemotherapy: Secondary | ICD-10-CM | POA: Diagnosis not present

## 2015-06-28 DIAGNOSIS — C50412 Malignant neoplasm of upper-outer quadrant of left female breast: Secondary | ICD-10-CM

## 2015-06-28 DIAGNOSIS — D6481 Anemia due to antineoplastic chemotherapy: Secondary | ICD-10-CM

## 2015-06-28 DIAGNOSIS — C773 Secondary and unspecified malignant neoplasm of axilla and upper limb lymph nodes: Secondary | ICD-10-CM

## 2015-06-28 DIAGNOSIS — Z171 Estrogen receptor negative status [ER-]: Secondary | ICD-10-CM | POA: Diagnosis not present

## 2015-06-28 DIAGNOSIS — Z95828 Presence of other vascular implants and grafts: Secondary | ICD-10-CM

## 2015-06-28 LAB — CBC WITH DIFFERENTIAL/PLATELET
BASO%: 1.1 % (ref 0.0–2.0)
Basophils Absolute: 0.1 10*3/uL (ref 0.0–0.1)
EOS%: 2.5 % (ref 0.0–7.0)
Eosinophils Absolute: 0.2 10*3/uL (ref 0.0–0.5)
HCT: 30.7 % — ABNORMAL LOW (ref 34.8–46.6)
HGB: 10.1 g/dL — ABNORMAL LOW (ref 11.6–15.9)
LYMPH%: 14.9 % (ref 14.0–49.7)
MCH: 29.3 pg (ref 25.1–34.0)
MCHC: 32.8 g/dL (ref 31.5–36.0)
MCV: 89.3 fL (ref 79.5–101.0)
MONO#: 0.4 10*3/uL (ref 0.1–0.9)
MONO%: 6.6 % (ref 0.0–14.0)
NEUT%: 74.9 % (ref 38.4–76.8)
NEUTROS ABS: 5 10*3/uL (ref 1.5–6.5)
PLATELETS: 370 10*3/uL (ref 145–400)
RBC: 3.44 10*6/uL — AB (ref 3.70–5.45)
RDW: 18.1 % — ABNORMAL HIGH (ref 11.2–14.5)
WBC: 6.7 10*3/uL (ref 3.9–10.3)
lymph#: 1 10*3/uL (ref 0.9–3.3)

## 2015-06-28 LAB — COMPREHENSIVE METABOLIC PANEL (CC13)
ALT: 16 U/L (ref 0–55)
ANION GAP: 8 meq/L (ref 3–11)
AST: 22 U/L (ref 5–34)
Albumin: 3.6 g/dL (ref 3.5–5.0)
Alkaline Phosphatase: 74 U/L (ref 40–150)
BILIRUBIN TOTAL: 0.61 mg/dL (ref 0.20–1.20)
BUN: 11.1 mg/dL (ref 7.0–26.0)
CO2: 29 meq/L (ref 22–29)
CREATININE: 1 mg/dL (ref 0.6–1.1)
Calcium: 10.8 mg/dL — ABNORMAL HIGH (ref 8.4–10.4)
Chloride: 104 mEq/L (ref 98–109)
EGFR: 57 mL/min/{1.73_m2} — ABNORMAL LOW (ref >90)
GLUCOSE: 101 mg/dL (ref 70–140)
Potassium: 3.7 mEq/L (ref 3.5–5.1)
SODIUM: 141 meq/L (ref 136–145)
TOTAL PROTEIN: 6.2 g/dL — AB (ref 6.4–8.3)

## 2015-06-28 MED ORDER — PALONOSETRON HCL INJECTION 0.25 MG/5ML
0.2500 mg | Freq: Once | INTRAVENOUS | Status: AC
  Administered 2015-06-28: 0.25 mg via INTRAVENOUS

## 2015-06-28 MED ORDER — SODIUM CHLORIDE 0.9 % IV SOLN
Freq: Once | INTRAVENOUS | Status: AC
  Administered 2015-06-28: 15:00:00 via INTRAVENOUS

## 2015-06-28 MED ORDER — PALONOSETRON HCL INJECTION 0.25 MG/5ML
INTRAVENOUS | Status: AC
  Filled 2015-06-28: qty 5

## 2015-06-28 MED ORDER — HEPARIN SOD (PORK) LOCK FLUSH 100 UNIT/ML IV SOLN
500.0000 [IU] | Freq: Once | INTRAVENOUS | Status: AC | PRN
  Administered 2015-06-28: 500 [IU]
  Filled 2015-06-28: qty 5

## 2015-06-28 MED ORDER — SODIUM CHLORIDE 0.9 % IJ SOLN
10.0000 mL | INTRAMUSCULAR | Status: DC | PRN
  Administered 2015-06-28: 10 mL via INTRAVENOUS
  Filled 2015-06-28: qty 10

## 2015-06-28 MED ORDER — SODIUM CHLORIDE 0.9 % IJ SOLN
10.0000 mL | INTRAMUSCULAR | Status: DC | PRN
  Administered 2015-06-28: 10 mL
  Filled 2015-06-28: qty 10

## 2015-06-28 MED ORDER — PACLITAXEL PROTEIN-BOUND CHEMO INJECTION 100 MG
65.0000 mg/m2 | Freq: Once | INTRAVENOUS | Status: AC
  Administered 2015-06-28: 100 mg via INTRAVENOUS
  Filled 2015-06-28: qty 20

## 2015-06-28 NOTE — Progress Notes (Signed)
Patient Care Team: Thressa Sheller, MD as PCP - General (Internal Medicine) Erroll Luna, MD as Consulting Physician (General Surgery) Nicholas Lose, MD as Consulting Physician (Hematology and Oncology) Gery Pray, MD as Consulting Physician (Radiation Oncology) Mauro Kaufmann, RN as Registered Nurse Rockwell Germany, RN as Registered Nurse  DIAGNOSIS: Breast cancer of upper-outer quadrant of left female breast   Staging form: Breast, AJCC 7th Edition     Clinical stage from 03/07/2015: Stage IIB (T2, N1, M0) - Unsigned       Staging comments: Staged at breast conference on 5.18.16    SUMMARY OF ONCOLOGIC HISTORY:   Breast cancer of upper-outer quadrant of left female breast   02/15/2015 Mammogram Left breast increased density, mass is irregular with microcalcifications measuring 2.9 cm, cyst in the breast as well as abnormal enlarged lymph nodes   02/20/2015 Initial Diagnosis Left breast biopsy: Invasive ductal carcinoma with DCIS, axillary lymph node biopsy positive, ER 0%, PR 0%, HER-2 negative ratio 1.13 with Ki-67 75%   03/15/2015 PET scan 2.8 cm solid irregular left breast mass which is hypermetabolic and consistent with known left breast cancer. 2. Weakly positive axillary lymph nodes or equivocal   03/20/2015 Breast MRI 3.1 cm irregular enhancing mass located within the left breast at the 1 o'clock position; 1.6 cm Left axillary LN   03/22/2015 -  Neo-Adjuvant Chemotherapy Neo-adjuvant chemotherapy with dose dense Adriamycin and Cytoxan 4 followed by Abraxane weekly 12    CHIEF COMPLIANT: Cycle 7/12 Abraxane  INTERVAL HISTORY: Carla Bennett is a 80 year old with above-mentioned history of left breast cancer currently on neo-adjuvant chemotherapy and today's cycle 7 of Abraxane. She generally manages this regimen well. She continues to have fatigue which bothers her most of all. Her appetite has been down but she gained 1.5lb since last week. She keeps her constipation at [place] with  colace and sennakot. She has some mouth sores, but these resolved with baking soda and salt water. She also has magic mouthwash on hand. She denies neuropathy.   REVIEW OF SYSTEMS:   Constitutional: Denies fevers, chills or abnormal weight loss, complains of severe fatigue Eyes: Denies blurriness of vision Ears, nose, mouth, throat, and face: Denies mucositis or sore throat Respiratory: Denies cough, dyspnea or wheezes Cardiovascular: Denies palpitation, chest discomfort or lower extremity swelling Gastrointestinal:  Denies nausea, heartburn or change in bowel habits Skin: Denies abnormal skin rashes Lymphatics: Denies new lymphadenopathy or easy bruising Neurological:Denies numbness, tingling or new weaknesses Behavioral/Psych: Mood is stable, no new changes  Breast:  denies any pain or lumps or nodules in either breasts All other systems were reviewed with the patient and are negative.  I have reviewed the past medical history, past surgical history, social history and family history with the patient and they are unchanged from previous note.  ALLERGIES:  has No Known Allergies.  MEDICATIONS:  Current Outpatient Prescriptions  Medication Sig Dispense Refill  . Alum & Mag Hydroxide-Simeth (MAGIC MOUTHWASH W/LIDOCAINE) SOLN Take 5 mLs by mouth 3 (three) times daily. 100 mL 0  . B Complex Vitamins (VITAMIN B COMPLEX PO) Take 1 tablet by mouth daily.     Marland Kitchen dexamethasone (DECADRON) 4 MG tablet     . furosemide (LASIX) 20 MG tablet Take 20 mg by mouth daily.     Marland Kitchen labetalol (NORMODYNE) 100 MG tablet Take 1 tablet (100 mg total) by mouth daily. (Patient taking differently: Take 100 mg by mouth daily. Pt takes 1/2 tablet daily= 50 mg) 30 tablet  0  . lidocaine-prilocaine (EMLA) cream Apply to port-a-cath 1-2 hours prior to access. 30 g 3  . lisinopril (PRINIVIL,ZESTRIL) 40 MG tablet Take 40 mg by mouth daily.     Marland Kitchen loratadine (CLARITIN) 10 MG tablet Take 10 mg by mouth daily.    . ondansetron  (ZOFRAN) 8 MG tablet     . prochlorperazine (COMPAZINE) 10 MG tablet TAKE 1 TABLET BY MOUTH EVERY 6 HOURS AS NEEDED FOR NAUSEA AND VOMITING  1  . loperamide (IMODIUM A-D) 2 MG tablet Take 2 mg by mouth 4 (four) times daily as needed for diarrhea or loose stools (diarrhea).    . LORazepam (ATIVAN) 0.5 MG tablet TAKE 1 TABLET BY MOUTH AT BEDTIME (Patient not taking: Reported on 06/28/2015) 30 tablet 0  . oxyCODONE-acetaminophen (ROXICET) 5-325 MG per tablet Take 1 tablet by mouth every 4 (four) hours as needed. (Patient not taking: Reported on 06/28/2015) 30 tablet 0   No current facility-administered medications for this visit.   Facility-Administered Medications Ordered in Other Visits  Medication Dose Route Frequency Provider Last Rate Last Dose  . sodium chloride 0.9 % injection 10 mL  10 mL Intracatheter PRN Nicholas Lose, MD   10 mL at 06/07/15 1559    PHYSICAL EXAMINATION: ECOG PERFORMANCE STATUS: 1 - Symptomatic but completely ambulatory  Filed Vitals:   06/28/15 1346  BP: 155/70  Pulse: 88  Temp: 98 F (36.7 C)  Resp: 18   Filed Weights   06/28/15 1346  Weight: 129 lb 8 oz (58.741 kg)    GENERAL:alert, no distress and comfortable SKIN: skin color, texture, turgor are normal, no rashes or significant lesions EYES: normal, Conjunctiva are pink and non-injected, sclera clear OROPHARYNX:no exudate, no erythema and lips, buccal mucosa, and tongue normal  NECK: supple, thyroid normal size, non-tender, without nodularity LYMPH:  no palpable lymphadenopathy in the cervical, axillary or inguinal LUNGS: clear to auscultation and percussion with normal breathing effort HEART: regular rate & rhythm and no murmurs and no lower extremity edema ABDOMEN:abdomen soft, non-tender and normal bowel sounds Musculoskeletal:no cyanosis of digits and no clubbing  NEURO: alert & oriented x 3 with fluent speech, no focal motor/sensory deficits   LABORATORY DATA:  I have reviewed the data as  listed   Chemistry      Component Value Date/Time   NA 141 06/28/2015 1320   NA 134* 04/15/2015 1556   K 3.7 06/28/2015 1320   K 3.7 04/15/2015 1556   CL 100* 04/15/2015 1556   CO2 29 06/28/2015 1320   CO2 26 04/15/2015 1556   BUN 11.1 06/28/2015 1320   BUN 10 04/15/2015 1556   CREATININE 1.0 06/28/2015 1320   CREATININE 1.18* 04/15/2015 1556      Component Value Date/Time   CALCIUM 10.8* 06/28/2015 1320   CALCIUM 10.5 05/24/2015 1508   ALKPHOS 74 06/28/2015 1320   ALKPHOS 69 03/16/2015 1133   AST 22 06/28/2015 1320   AST 22 03/16/2015 1133   ALT 16 06/28/2015 1320   ALT 18 03/16/2015 1133   BILITOT 0.61 06/28/2015 1320   BILITOT 0.6 03/16/2015 1133       Lab Results  Component Value Date   WBC 6.7 06/28/2015   HGB 10.1* 06/28/2015   HCT 30.7* 06/28/2015   MCV 89.3 06/28/2015   PLT 370 06/28/2015   NEUTROABS 5.0 06/28/2015   ASSESSMENT & PLAN:  Breast cancer of upper-outer quadrant of left female breast Left breast invasive ductal carcinoma grade 3 ER 0% PR 0%  HER-2 negative Ki-67 75%, 2.9 cm mass with microcalcifications plus abnormal lymph nodes biopsy-proven to be breast cancer T2 N1 M0 stage IIB clinical stage PET-CT: Neg Breast MRI: 3.1 cm left breast mass, 1.6 cm Left Axillary LN  Treatment Plan:  1. Neoadjuvant chemotherapy: Dose dense Adriamycin and Cytoxan 4 followed by Abraxane weekly 12 2. Consideration for [facility] clinical trial for surgery 3. Followed by adjuvant radiation Goal of treatment: Cure  Current treatment: Abraxane week 7/12 Chemotherapy toxicities: 1. Alopecia 2. Mouth sores : I prescribed Magic mouthwash and decrease the dosage of fourth Adriamycin and Cytoxan 3. Fatigue related to chemotherapy 4. Loss of taste 5. Anemia due to chemotherapy hemoglobin 10.1 today 6. Leucocytosis 7. Lip sore: resolved 8. Weight loss: Due to decreased taste. I discussed at length that she needs to find ways to increase her protein intake in  her diet. We discussed several options with her. 9. Constipation: using colace and sennakot daily.   Hypertension: on 1/2 tablet of labetalol daily  Return to clinic in 2 weeks for cycle 9/12 Abraxane    No orders of the defined types were placed in this encounter.   The patient has a good understanding of the overall plan. she agrees with it. she will call with any problems that may develop before the next visit here.   Laurie Panda, NP

## 2015-06-28 NOTE — Assessment & Plan Note (Signed)
Left breast invasive ductal carcinoma grade 3 ER 0% PR 0% HER-2 negative Ki-67 75%, 2.9 cm mass with microcalcifications plus abnormal lymph nodes biopsy-proven to be breast cancer T2 N1 M0 stage IIB clinical stage PET-CT: Neg Breast MRI: 3.1 cm left breast mass, 1.6 cm Left Axillary LN  Treatment Plan:  1. Neoadjuvant chemotherapy: Dose dense Adriamycin and Cytoxan 4 followed by Abraxane weekly 12 2. Consideration for Alliance clinical trial for surgery 3. Followed by adjuvant radiation Goal of treatment: Cure  Current treatment: Abraxane week 7/12 Chemotherapy toxicities: 1. Alopecia 2. Mouth sores : I prescribed Magic mouthwash and decrease the dosage of fourth Adriamycin and Cytoxan 3. Fatigue related to chemotherapy 4. Loss of taste 5. Anemia due to chemotherapy hemoglobin 10.1 today 6. Leucocytosis 7. Lip sore: resolved 8. Weight loss: Due to decreased taste. I discussed at length that she needs to find ways to increase her protein intake in her diet. We discussed several options with her. 9. Constipation: using colace and sennakot daily.   Hypertension: on 1/2 tablet of labetalol daily  Return to clinic in 2 weeks for cycle 9/12 Abraxane 

## 2015-06-28 NOTE — Telephone Encounter (Signed)
Gave avs & calendar for September/October. °

## 2015-06-28 NOTE — Patient Instructions (Signed)
[facility] Discharge Instructions for Patients Receiving Chemotherapy  Today you received the following chemotherapy agents: Abraxane.  To help prevent nausea and vomiting after your treatment, we encourage you to take your nausea medication:  Compazine 10 mg every 6 hours as needed.   If you develop nausea and vomiting that is not controlled by your nausea medication, call the clinic.   BELOW ARE SYMPTOMS THAT SHOULD BE REPORTED IMMEDIATELY:  *FEVER GREATER THAN 100.5 F  *CHILLS WITH OR WITHOUT FEVER  NAUSEA AND VOMITING THAT IS NOT CONTROLLED WITH YOUR NAUSEA MEDICATION  *UNUSUAL SHORTNESS OF BREATH  *UNUSUAL BRUISING OR BLEEDING  TENDERNESS IN MOUTH AND THROAT WITH OR WITHOUT PRESENCE OF ULCERS  *URINARY PROBLEMS  *BOWEL PROBLEMS  UNUSUAL RASH Items with * indicate a potential emergency and should be followed up as soon as possible.  Feel free to call the clinic you have any questions or concerns. The clinic phone number is [phone].  Please show the CHEMO ALERT CARD at check-in to the Emergency Department and triage nurse.   

## 2015-07-05 ENCOUNTER — Ambulatory Visit: Payer: Medicare Other

## 2015-07-05 ENCOUNTER — Encounter: Payer: Self-pay | Admitting: *Deleted

## 2015-07-05 ENCOUNTER — Ambulatory Visit (HOSPITAL_BASED_OUTPATIENT_CLINIC_OR_DEPARTMENT_OTHER): Payer: Medicare Other

## 2015-07-05 ENCOUNTER — Ambulatory Visit (HOSPITAL_BASED_OUTPATIENT_CLINIC_OR_DEPARTMENT_OTHER): Payer: Medicare Other | Admitting: Nurse Practitioner

## 2015-07-05 ENCOUNTER — Other Ambulatory Visit: Payer: Medicare Other

## 2015-07-05 ENCOUNTER — Other Ambulatory Visit: Payer: Self-pay | Admitting: Nurse Practitioner

## 2015-07-05 ENCOUNTER — Other Ambulatory Visit (HOSPITAL_BASED_OUTPATIENT_CLINIC_OR_DEPARTMENT_OTHER): Payer: Medicare Other

## 2015-07-05 ENCOUNTER — Encounter: Payer: Self-pay | Admitting: Nurse Practitioner

## 2015-07-05 VITALS — BP 168/80 | HR 92 | Temp 97.6°F | Resp 18

## 2015-07-05 DIAGNOSIS — D6481 Anemia due to antineoplastic chemotherapy: Secondary | ICD-10-CM

## 2015-07-05 DIAGNOSIS — C50412 Malignant neoplasm of upper-outer quadrant of left female breast: Secondary | ICD-10-CM

## 2015-07-05 DIAGNOSIS — Z5111 Encounter for antineoplastic chemotherapy: Secondary | ICD-10-CM | POA: Diagnosis not present

## 2015-07-05 DIAGNOSIS — C773 Secondary and unspecified malignant neoplasm of axilla and upper limb lymph nodes: Secondary | ICD-10-CM | POA: Diagnosis not present

## 2015-07-05 DIAGNOSIS — I10 Essential (primary) hypertension: Secondary | ICD-10-CM | POA: Diagnosis not present

## 2015-07-05 DIAGNOSIS — D72828 Other elevated white blood cell count: Secondary | ICD-10-CM

## 2015-07-05 DIAGNOSIS — T829XXA Unspecified complication of cardiac and vascular prosthetic device, implant and graft, initial encounter: Secondary | ICD-10-CM | POA: Diagnosis not present

## 2015-07-05 DIAGNOSIS — Z95828 Presence of other vascular implants and grafts: Secondary | ICD-10-CM

## 2015-07-05 DIAGNOSIS — Z171 Estrogen receptor negative status [ER-]: Secondary | ICD-10-CM

## 2015-07-05 LAB — CBC WITH DIFFERENTIAL/PLATELET
BASO%: 1.1 % (ref 0.0–2.0)
Basophils Absolute: 0.1 10*3/uL (ref 0.0–0.1)
EOS%: 2.7 % (ref 0.0–7.0)
Eosinophils Absolute: 0.2 10*3/uL (ref 0.0–0.5)
HEMATOCRIT: 31.6 % — AB (ref 34.8–46.6)
HEMOGLOBIN: 10.2 g/dL — AB (ref 11.6–15.9)
LYMPH#: 1.2 10*3/uL (ref 0.9–3.3)
LYMPH%: 21.8 % (ref 14.0–49.7)
MCH: 30 pg (ref 25.1–34.0)
MCHC: 32.3 g/dL (ref 31.5–36.0)
MCV: 92.9 fL (ref 79.5–101.0)
MONO#: 0.5 10*3/uL (ref 0.1–0.9)
MONO%: 8.1 % (ref 0.0–14.0)
NEUT#: 3.7 10*3/uL (ref 1.5–6.5)
NEUT%: 66.3 % (ref 38.4–76.8)
Platelets: 304 10*3/uL (ref 145–400)
RBC: 3.4 10*6/uL — ABNORMAL LOW (ref 3.70–5.45)
RDW: 16.2 % — AB (ref 11.2–14.5)
WBC: 5.6 10*3/uL (ref 3.9–10.3)

## 2015-07-05 LAB — COMPREHENSIVE METABOLIC PANEL (CC13)
ALBUMIN: 3.9 g/dL (ref 3.5–5.0)
ALK PHOS: 64 U/L (ref 40–150)
ALT: 15 U/L (ref 0–55)
AST: 23 U/L (ref 5–34)
Anion Gap: 7 mEq/L (ref 3–11)
BUN: 10.1 mg/dL (ref 7.0–26.0)
CALCIUM: 10.8 mg/dL — AB (ref 8.4–10.4)
CO2: 29 mEq/L (ref 22–29)
CREATININE: 0.9 mg/dL (ref 0.6–1.1)
Chloride: 105 mEq/L (ref 98–109)
EGFR: 59 mL/min/{1.73_m2} — ABNORMAL LOW (ref >90)
Glucose: 100 mg/dl (ref 70–140)
Potassium: 3.6 mEq/L (ref 3.5–5.1)
Sodium: 141 mEq/L (ref 136–145)
Total Bilirubin: 0.49 mg/dL (ref 0.20–1.20)
Total Protein: 6.4 g/dL (ref 6.4–8.3)

## 2015-07-05 MED ORDER — PALONOSETRON HCL INJECTION 0.25 MG/5ML
INTRAVENOUS | Status: AC
Start: 2015-07-05 — End: 2015-07-05
  Filled 2015-07-05: qty 5

## 2015-07-05 MED ORDER — HEPARIN SOD (PORK) LOCK FLUSH 100 UNIT/ML IV SOLN
500.0000 [IU] | Freq: Once | INTRAVENOUS | Status: AC | PRN
  Administered 2015-07-05: 500 [IU]
  Filled 2015-07-05: qty 5

## 2015-07-05 MED ORDER — SODIUM CHLORIDE 0.9 % IJ SOLN
10.0000 mL | INTRAMUSCULAR | Status: DC | PRN
  Administered 2015-07-05: 10 mL via INTRAVENOUS
  Filled 2015-07-05: qty 10

## 2015-07-05 MED ORDER — SODIUM CHLORIDE 0.9 % IJ SOLN
10.0000 mL | INTRAMUSCULAR | Status: DC | PRN
  Administered 2015-07-05: 10 mL
  Filled 2015-07-05: qty 10

## 2015-07-05 MED ORDER — PACLITAXEL PROTEIN-BOUND CHEMO INJECTION 100 MG
65.0000 mg/m2 | Freq: Once | INTRAVENOUS | Status: AC
  Administered 2015-07-05: 100 mg via INTRAVENOUS
  Filled 2015-07-05: qty 20

## 2015-07-05 MED ORDER — SODIUM CHLORIDE 0.9 % IV SOLN
Freq: Once | INTRAVENOUS | Status: AC
  Administered 2015-07-05: 15:00:00 via INTRAVENOUS

## 2015-07-05 MED ORDER — PALONOSETRON HCL INJECTION 0.25 MG/5ML
0.2500 mg | Freq: Once | INTRAVENOUS | Status: AC
  Administered 2015-07-05: 0.25 mg via INTRAVENOUS

## 2015-07-05 NOTE — Progress Notes (Signed)
SYMPTOM MANAGEMENT CLINIC   HPI: Carla Bennett 80 y.o. female diagnosed with breast cancer.  Currently undergoing weekly Abraxane chemotherapy.   Patient presented to the [facility] today to receive cycle 2, day 22 of her Abraxane chemotherapy regimen.  It was noted per the [facility] that her right upper chest Port-A-Cath site had what appeared to be a tiny hole at the end of the previous insertion site. Patient denies any fevers or chills whatsoever.  HPI  ROS  Past Medical History  Diagnosis Date  . Diverticulosis of colon (without mention of hemorrhage)   . Benign neoplasm of colon   . Diverticulitis   . Arthritis   . Gout   . Hypertension   . Allergy   . Other issue of medical certificates     uterine myomata  . Hematuria     negative urology work up  . Hx of abnormal cervical Pap smear     LEEP-CIN I, negative margins and ECC  . Uterine prolaps   . Cystocele   . Basal cell carcinoma   . Hypercalcemia   . Cholelithiasis   . History of blood transfusion     x 1 with D&C  . Renal disease 3/16    Stage 3 kidney disease-seeing Dr Buddy Duty and Dr Marval Regal  . Breast cancer of upper-outer quadrant of left female breast 02/23/2015  . Heart murmur   . Parathyroid abnormality     Past Surgical History  Procedure Laterality Date  . Cystocele repair  08/2010    Rectocele, vault prolapse  . Dilation and curettage of uterus      x2  . Tonsillectomy and adenoidectomy    . Cholecystectomy    . Total vaginal hysterectomy      secondary to prolapse, ovaries not removed  . Portacath placement Right 03/20/2015    Procedure: INSERTION PORT-A-CATH;  Surgeon: Erroll Luna, MD;  Location: [facility];  Service: General;  Laterality: Right;    has Hematochezia; Abdominal pain, other specified site; Breast cancer of upper-outer quadrant of left female breast; Family history of malignant neoplasm of gastrointestinal tract; Family history of malignant neoplasm of breast;  Hypercalcemia; Sinusitis; and Central line complication on her problem list.    has No Known Allergies.    Medication List       This list is accurate as of: 07/05/15  3:40 PM.  Always use your most recent med list.               dexamethasone 4 MG tablet  Commonly known as:  DECADRON     furosemide 20 MG tablet  Commonly known as:  LASIX  Take 20 mg by mouth daily.     labetalol 100 MG tablet  Commonly known as:  NORMODYNE  Take 1 tablet (100 mg total) by mouth daily.     lidocaine-prilocaine cream  Commonly known as:  EMLA  Apply to port-a-cath 1-2 hours prior to access.     lisinopril 40 MG tablet  Commonly known as:  PRINIVIL,ZESTRIL  Take 40 mg by mouth daily.     loperamide 2 MG tablet  Commonly known as:  IMODIUM A-D  Take 2 mg by mouth 4 (four) times daily as needed for diarrhea or loose stools (diarrhea).     loratadine 10 MG tablet  Commonly known as:  CLARITIN  Take 10 mg by mouth daily.     LORazepam 0.5 MG tablet  Commonly known as:  ATIVAN  TAKE  1 TABLET BY MOUTH AT BEDTIME     magic mouthwash w/lidocaine Soln  Take 5 mLs by mouth 3 (three) times daily.     ondansetron 8 MG tablet  Commonly known as:  ZOFRAN     oxyCODONE-acetaminophen 5-325 MG per tablet  Commonly known as:  ROXICET  Take 1 tablet by mouth every 4 (four) hours as needed.     prochlorperazine 10 MG tablet  Commonly known as:  COMPAZINE  TAKE 1 TABLET BY MOUTH EVERY 6 HOURS AS NEEDED FOR NAUSEA AND VOMITING     VITAMIN B COMPLEX PO  Take 1 tablet by mouth daily.         PHYSICAL EXAMINATION  Oncology Vitals 07/05/2015 07/05/2015 06/28/2015 06/21/2015 06/14/2015 06/07/2015 06/06/2015  Height - - 155 cm - 155 cm - -  Weight - - 58.741 kg 58.287 kg 58.514 kg - 58.559 kg  Weight (lbs) - - 129 lbs 8 oz 128 lbs 8 oz 129 lbs - 129 lbs 2 oz  BMI (kg/m2) - - 24.47 kg/m2 - 24.37 kg/m2 - -  Temp - 97.6 98 97.6 98.1 98.3 98.2  Pulse 81 82 88 96 94 93 101  Resp - '18 18 18 18 20 18    ' SpO2 - 100 99 100 100 98 97  BSA (m2) - - 1.59 m2 - 1.59 m2 - -   BP Readings from Last 3 Encounters:  07/05/15 182/75  06/28/15 155/70  06/21/15 168/69    Physical Exam  Constitutional: She is oriented to person, place, and time and well-developed, well-nourished, and in no distress.  HENT:  Head: Normocephalic and atraumatic.  Eyes: Conjunctivae and EOM are normal. Pupils are equal, round, and reactive to light. Right eye exhibits no discharge. Left eye exhibits no discharge. No scleral icterus.  Neck: Normal range of motion.  Pulmonary/Chest: Effort normal. No respiratory distress.  Musculoskeletal: Normal range of motion. She exhibits no edema.  Neurological: She is alert and oriented to person, place, and time. Gait normal.  Skin: Skin is warm and dry. No rash noted. No erythema. No pallor.  On exam.-There was indeed a tiny hole at the corner of the previous surgical insertion site.  It appears that this was the area of a previous suture.  There is no surrounding erythema, edema, warmth, tenderness, or red streaks.  There is also no drainage from the site.      Psychiatric: Affect normal.  Nursing note and vitals reviewed.   LABORATORY DATA:. Appointment on 07/05/2015  Component Date Value Ref Range Status  . WBC 07/05/2015 5.6  3.9 - 10.3 10e3/uL Final  . NEUT# 07/05/2015 3.7  1.5 - 6.5 10e3/uL Final  . HGB 07/05/2015 10.2* 11.6 - 15.9 g/dL Final  . HCT 07/05/2015 31.6* 34.8 - 46.6 % Final  . Platelets 07/05/2015 304  145 - 400 10e3/uL Final  . MCV 07/05/2015 92.9  79.5 - 101.0 fL Final  . MCH 07/05/2015 30.0  25.1 - 34.0 pg Final  . MCHC 07/05/2015 32.3  31.5 - 36.0 g/dL Final  . RBC 07/05/2015 3.40* 3.70 - 5.45 10e6/uL Final  . RDW 07/05/2015 16.2* 11.2 - 14.5 % Final  . lymph# 07/05/2015 1.2  0.9 - 3.3 10e3/uL Final  . MONO# 07/05/2015 0.5  0.1 - 0.9 10e3/uL Final  . Eosinophils Absolute 07/05/2015 0.2  0.0 - 0.5 10e3/uL Final  . Basophils Absolute 07/05/2015  0.1  0.0 - 0.1 10e3/uL Final  . NEUT% 07/05/2015 66.3  38.4 - 76.8 %  Final  . LYMPH% 07/05/2015 21.8  14.0 - 49.7 % Final  . MONO% 07/05/2015 8.1  0.0 - 14.0 % Final  . EOS% 07/05/2015 2.7  0.0 - 7.0 % Final  . BASO% 07/05/2015 1.1  0.0 - 2.0 % Final  . Sodium 07/05/2015 141  136 - 145 mEq/L Final  . Potassium 07/05/2015 3.6  3.5 - 5.1 mEq/L Final  . Chloride 07/05/2015 105  98 - 109 mEq/L Final  . CO2 07/05/2015 29  22 - 29 mEq/L Final  . Glucose 07/05/2015 100  70 - 140 mg/dl Final   Glucose reference range is for nonfasting patients. Fasting glucose reference range is 70- 100.  Marland Kitchen BUN 07/05/2015 10.1  7.0 - 26.0 mg/dL Final  . Creatinine 07/05/2015 0.9  0.6 - 1.1 mg/dL Final  . Total Bilirubin 07/05/2015 0.49  0.20 - 1.20 mg/dL Final  . Alkaline Phosphatase 07/05/2015 64  40 - 150 U/L Final  . AST 07/05/2015 23  5 - 34 U/L Final  . ALT 07/05/2015 15  0 - 55 U/L Final  . Total Protein 07/05/2015 6.4  6.4 - 8.3 g/dL Final  . Albumin 07/05/2015 3.9  3.5 - 5.0 g/dL Final  . Calcium 07/05/2015 10.8* 8.4 - 10.4 mg/dL Final  . Anion Gap 07/05/2015 7  3 - 11 mEq/L Final  . EGFR 07/05/2015 59* >90 ml/min/1.73 m2 Final   eGFR is calculated using the CKD-EPI Creatinine Equation (2009)   Right chest port site:      RADIOGRAPHIC STUDIES: No results found.  ASSESSMENT/PLAN:    Central line complication Patient presented to the [facility] today to receive cycle 2, day 22 of her Abraxane chemotherapy regimen.  It was noted per the [facility] that her right upper chest Port-A-Cath site had what appeared to be a tiny hole at the end of the previous insertion site.  On exam.-There was indeed a tiny hole at the corner of the previous surgical insertion site.  It appears that this was the area of a previous suture.  There is no surrounding erythema, edema, warmth, tenderness, or red streaks.  There is also no drainage from the site.  Sincerely is no evidence of infection; and  patient's white count is stable.-Will proceed today with chemotherapy as planned.  Confirmed that patient's Port-A-Cath was placed by Dr. Erroll Luna, general surgeon.  Have arranged for patient to have further evaluation of Port-A-Cath site per Dr. Brantley Stage on Monday, 07/09/2015 at 9:45 AM.  Patient is aware of this appointment.  Patient was advised to call/return or go directly to the emergency department for any worsening symptoms whatsoever.  Breast cancer of upper-outer quadrant of left female breast Left breast invasive ductal carcinoma grade 3 ER 0% PR 0% HER-2 negative Ki-67 75%, 2.9 cm mass with microcalcifications plus abnormal lymph nodes biopsy-proven to be breast cancer T2 N1 M0 stage IIB clinical stage PET-CT: Neg Breast MRI: 3.1 cm left breast mass, 1.6 cm Left Axillary LN  Treatment Plan:  1. Neoadjuvant chemotherapy: Dose dense Adriamycin and Cytoxan 4 followed by Abraxane weekly 12 2. Consideration for Alliance clinical trial for surgery 3. Followed by adjuvant radiation Goal of treatment: Cure  Current treatment: Abraxane  Chemotherapy toxicities: 1. Alopecia 2. Mouth sores : I prescribed Magic mouthwash and decrease the dosage of fourth Adriamycin and Cytoxan 3. Fatigue related to chemotherapy 4. Loss of taste 5. Anemia due to chemotherapy hemoglobin 10.1 today 6. Leucocytosis 7. Lip sore: resolved 8. Weight loss: Due  to decreased taste. I discussed at length that she needs to find ways to increase her protein intake in her diet. We discussed several options with her. 9. Constipation: using colace and sennakot daily.   Hypertension: on 1/2 tablet of labetalol daily  Blood counts obtained today reveal a WBC of 5.6, ANC 3.7, hemoglobin 10.2, and platelet count 304.  Patient states that she feels fairly well; and is ready to proceed with her next cycle of chemotherapy.  Patient is scheduled to return on 07/12/2015 for her next labs, visit, and  chemotherapy.    Patient stated understanding of all instructions; and was in agreement with this plan of care. The patient knows to call the clinic with any problems, questions or concerns.   Review/collaboration with Dr. Lindi Adie regarding all aspects of patient's visit today.   Total time spent with patient was 25 minutes;  with greater than 75 percent of that time spent in face to face counseling regarding patient's symptoms,  and coordination of care and follow up.  Disclaimer:This dictation was prepared with Dragon/digital dictation along with [employer]. Any transcriptional errors that result from this process are unintentional.  Drue Second, NP 07/05/2015

## 2015-07-05 NOTE — Assessment & Plan Note (Signed)
Left breast invasive ductal carcinoma grade 3 ER 0% PR 0% HER-2 negative Ki-67 75%, 2.9 cm mass with microcalcifications plus abnormal lymph nodes biopsy-proven to be breast cancer T2 N1 M0 stage IIB clinical stage PET-CT: Neg Breast MRI: 3.1 cm left breast mass, 1.6 cm Left Axillary LN  Treatment Plan:  1. Neoadjuvant chemotherapy: Dose dense Adriamycin and Cytoxan 4 followed by Abraxane weekly 12 2. Consideration for Alliance clinical trial for surgery 3. Followed by adjuvant radiation Goal of treatment: Cure  Current treatment: Abraxane  Chemotherapy toxicities: 1. Alopecia 2. Mouth sores : I prescribed Magic mouthwash and decrease the dosage of fourth Adriamycin and Cytoxan 3. Fatigue related to chemotherapy 4. Loss of taste 5. Anemia due to chemotherapy hemoglobin 10.1 today 6. Leucocytosis 7. Lip sore: resolved 8. Weight loss: Due to decreased taste. I discussed at length that she needs to find ways to increase her protein intake in her diet. We discussed several options with her. 9. Constipation: using colace and sennakot daily.   Hypertension: on 1/2 tablet of labetalol daily  Blood counts obtained today reveal a WBC of 5.6, ANC 3.7, hemoglobin 10.2, and platelet count 304.  Patient states that she feels fairly well; and is ready to proceed with her next cycle of chemotherapy.  Patient is scheduled to return on 07/12/2015 for her next labs, visit, and chemotherapy.   

## 2015-07-05 NOTE — Assessment & Plan Note (Signed)
Patient presented to the [facility] today to receive cycle 2, day 22 of her Abraxane chemotherapy regimen.  It was noted per the [facility] that her right upper chest Port-A-Cath site had what appeared to be a tiny hole at the end of the previous insertion site.  On exam.-There was indeed a tiny hole at the corner of the previous surgical insertion site.  It appears that this was the area of a previous suture.  There is no surrounding erythema, edema, warmth, tenderness, or red streaks.  There is also no drainage from the site.  Sincerely is no evidence of infection; and patient's white count is stable.-Will proceed today with chemotherapy as planned.  Confirmed that patient's Port-A-Cath was placed by Dr. Erroll Luna, general surgeon.  Have arranged for patient to have further evaluation of Port-A-Cath site per Dr. Brantley Stage on Monday, 07/09/2015 at 9:45 AM.  Patient is aware of this appointment.  Patient was advised to call/return or go directly to the emergency department for any worsening symptoms whatsoever.

## 2015-07-05 NOTE — Patient Instructions (Signed)

## 2015-07-05 NOTE — Patient Instructions (Signed)
[facility] Discharge Instructions for Patients Receiving Chemotherapy  Today you received the following chemotherapy agents: Abraxane   To help prevent nausea and vomiting after your treatment, we encourage you to take your nausea medication as directed.    If you develop nausea and vomiting that is not controlled by your nausea medication, call the clinic.   BELOW ARE SYMPTOMS THAT SHOULD BE REPORTED IMMEDIATELY:  *FEVER GREATER THAN 100.5 F  *CHILLS WITH OR WITHOUT FEVER  NAUSEA AND VOMITING THAT IS NOT CONTROLLED WITH YOUR NAUSEA MEDICATION  *UNUSUAL SHORTNESS OF BREATH  *UNUSUAL BRUISING OR BLEEDING  TENDERNESS IN MOUTH AND THROAT WITH OR WITHOUT PRESENCE OF ULCERS  *URINARY PROBLEMS  *BOWEL PROBLEMS  UNUSUAL RASH Items with * indicate a potential emergency and should be followed up as soon as possible.  Feel free to call the clinic you have any questions or concerns. The clinic phone number is [phone].  Please show the CHEMO ALERT CARD at check-in to the Emergency Department and triage nurse.   

## 2015-07-05 NOTE — Progress Notes (Signed)
1435: B/P 182/75 Dr. Lindi Adie aware, okay to treat, pt to follow up with PCP per Dr. Lindi Adie.

## 2015-07-09 ENCOUNTER — Ambulatory Visit: Payer: Self-pay | Admitting: Surgery

## 2015-07-09 DIAGNOSIS — Z9889 Other specified postprocedural states: Secondary | ICD-10-CM | POA: Diagnosis not present

## 2015-07-09 DIAGNOSIS — C50912 Malignant neoplasm of unspecified site of left female breast: Secondary | ICD-10-CM | POA: Diagnosis not present

## 2015-07-12 ENCOUNTER — Encounter: Payer: Self-pay | Admitting: Hematology and Oncology

## 2015-07-12 ENCOUNTER — Telehealth: Payer: Self-pay | Admitting: Hematology and Oncology

## 2015-07-12 ENCOUNTER — Other Ambulatory Visit (HOSPITAL_BASED_OUTPATIENT_CLINIC_OR_DEPARTMENT_OTHER): Payer: Medicare Other

## 2015-07-12 ENCOUNTER — Ambulatory Visit (HOSPITAL_BASED_OUTPATIENT_CLINIC_OR_DEPARTMENT_OTHER): Payer: Medicare Other | Admitting: Hematology and Oncology

## 2015-07-12 ENCOUNTER — Other Ambulatory Visit: Payer: Medicare Other

## 2015-07-12 ENCOUNTER — Ambulatory Visit: Payer: Medicare Other

## 2015-07-12 ENCOUNTER — Ambulatory Visit (HOSPITAL_BASED_OUTPATIENT_CLINIC_OR_DEPARTMENT_OTHER): Payer: Medicare Other

## 2015-07-12 VITALS — BP 175/89 | HR 97 | Temp 97.9°F | Resp 18 | Ht 61.0 in | Wt 130.9 lb

## 2015-07-12 DIAGNOSIS — C50412 Malignant neoplasm of upper-outer quadrant of left female breast: Secondary | ICD-10-CM

## 2015-07-12 DIAGNOSIS — Z95828 Presence of other vascular implants and grafts: Secondary | ICD-10-CM

## 2015-07-12 DIAGNOSIS — Z5111 Encounter for antineoplastic chemotherapy: Secondary | ICD-10-CM

## 2015-07-12 LAB — COMPREHENSIVE METABOLIC PANEL (CC13)
ALBUMIN: 4 g/dL (ref 3.5–5.0)
ALK PHOS: 69 U/L (ref 40–150)
ALT: 16 U/L (ref 0–55)
AST: 25 U/L (ref 5–34)
Anion Gap: 9 mEq/L (ref 3–11)
BILIRUBIN TOTAL: 0.57 mg/dL (ref 0.20–1.20)
BUN: 11.8 mg/dL (ref 7.0–26.0)
CALCIUM: 10.7 mg/dL — AB (ref 8.4–10.4)
CO2: 28 mEq/L (ref 22–29)
Chloride: 104 mEq/L (ref 98–109)
Creatinine: 0.8 mg/dL (ref 0.6–1.1)
EGFR: 66 mL/min/{1.73_m2} — AB (ref >90)
GLUCOSE: 79 mg/dL (ref 70–140)
POTASSIUM: 3.6 meq/L (ref 3.5–5.1)
SODIUM: 141 meq/L (ref 136–145)
TOTAL PROTEIN: 6.5 g/dL (ref 6.4–8.3)

## 2015-07-12 LAB — CBC WITH DIFFERENTIAL/PLATELET
BASO%: 0.1 % (ref 0.0–2.0)
BASOS ABS: 0 10*3/uL (ref 0.0–0.1)
EOS ABS: 0.1 10*3/uL (ref 0.0–0.5)
EOS%: 2.6 % (ref 0.0–7.0)
HEMATOCRIT: 34.2 % — AB (ref 34.8–46.6)
HEMOGLOBIN: 11.4 g/dL — AB (ref 11.6–15.9)
LYMPH#: 1.3 10*3/uL (ref 0.9–3.3)
LYMPH%: 22.4 % (ref 14.0–49.7)
MCH: 30.6 pg (ref 25.1–34.0)
MCHC: 33.4 g/dL (ref 31.5–36.0)
MCV: 91.8 fL (ref 79.5–101.0)
MONO#: 0.5 10*3/uL (ref 0.1–0.9)
MONO%: 8.6 % (ref 0.0–14.0)
NEUT%: 66.3 % (ref 38.4–76.8)
NEUTROS ABS: 3.8 10*3/uL (ref 1.5–6.5)
Platelets: 337 10*3/uL (ref 145–400)
RBC: 3.73 10*6/uL (ref 3.70–5.45)
RDW: 17.1 % — AB (ref 11.2–14.5)
WBC: 5.7 10*3/uL (ref 3.9–10.3)

## 2015-07-12 MED ORDER — SODIUM CHLORIDE 0.9 % IV SOLN
Freq: Once | INTRAVENOUS | Status: AC
  Administered 2015-07-12: 14:00:00 via INTRAVENOUS

## 2015-07-12 MED ORDER — HEPARIN SOD (PORK) LOCK FLUSH 100 UNIT/ML IV SOLN
500.0000 [IU] | Freq: Once | INTRAVENOUS | Status: AC | PRN
  Administered 2015-07-12: 500 [IU]
  Filled 2015-07-12: qty 5

## 2015-07-12 MED ORDER — PALONOSETRON HCL INJECTION 0.25 MG/5ML
INTRAVENOUS | Status: AC
  Filled 2015-07-12: qty 5

## 2015-07-12 MED ORDER — SODIUM CHLORIDE 0.9 % IJ SOLN
10.0000 mL | INTRAMUSCULAR | Status: DC | PRN
  Administered 2015-07-12: 10 mL
  Filled 2015-07-12: qty 10

## 2015-07-12 MED ORDER — PACLITAXEL PROTEIN-BOUND CHEMO INJECTION 100 MG
65.0000 mg/m2 | Freq: Once | INTRAVENOUS | Status: AC
  Administered 2015-07-12: 100 mg via INTRAVENOUS
  Filled 2015-07-12: qty 20

## 2015-07-12 MED ORDER — PALONOSETRON HCL INJECTION 0.25 MG/5ML
0.2500 mg | Freq: Once | INTRAVENOUS | Status: AC
  Administered 2015-07-12: 0.25 mg via INTRAVENOUS

## 2015-07-12 MED ORDER — SODIUM CHLORIDE 0.9 % IJ SOLN
10.0000 mL | INTRAMUSCULAR | Status: DC | PRN
  Administered 2015-07-12: 10 mL via INTRAVENOUS
  Filled 2015-07-12: qty 10

## 2015-07-12 NOTE — Assessment & Plan Note (Signed)
Left breast invasive ductal carcinoma grade 3 ER 0% PR 0% HER-2 negative Ki-67 75%, 2.9 cm mass with microcalcifications plus abnormal lymph nodes biopsy-proven to be breast cancer T2 N1 M0 stage IIB clinical stage PET-CT: Neg Breast MRI: 3.1 cm left breast mass, 1.6 cm Left Axillary LN  Treatment Plan:  1. Neoadjuvant chemotherapy: Dose dense Adriamycin and Cytoxan 4 followed by Abraxane weekly 12 2. Consideration for Alliance clinical trial for surgery 3. Followed by adjuvant radiation Goal of treatment: Cure  Current treatment: Abraxane week 9/12 Chemotherapy toxicities: 1. Alopecia 2. Mouth sores : I prescribed Magic mouthwash and decrease the dosage of fourth Adriamycin and Cytoxan 3. Fatigue related to chemotherapy 4. Loss of taste 5. Anemia due to chemotherapy hemoglobin 10.5 today 6. Leucocytosis 7. Lip sore: resolved 8. Weight loss: Due to decreased taste.  9. Port issues: After last cycle of chemotherapy there was a small hole at the site of port access.  Hypertension: resumed half a tablet of labetalol daily.  Return to clinic in 2 weeks for cycle 11/12 Abraxane

## 2015-07-12 NOTE — Progress Notes (Signed)
Patient Care Team: Thressa Sheller, MD as PCP - General (Internal Medicine) Erroll Luna, MD as Consulting Physician (General Surgery) Nicholas Lose, MD as Consulting Physician (Hematology and Oncology) Gery Pray, MD as Consulting Physician (Radiation Oncology) Mauro Kaufmann, RN as Registered Nurse Rockwell Germany, RN as Registered Nurse  DIAGNOSIS: Breast cancer of upper-outer quadrant of left female breast   Staging form: Breast, AJCC 7th Edition     Clinical stage from 03/07/2015: Stage IIB (T2, N1, M0) - Unsigned       Staging comments: Staged at breast conference on 5.18.16  SUMMARY OF ONCOLOGIC HISTORY:   Breast cancer of upper-outer quadrant of left female breast   02/15/2015 Mammogram Left breast increased density, mass is irregular with microcalcifications measuring 2.9 cm, cyst in the breast as well as abnormal enlarged lymph nodes   02/20/2015 Initial Diagnosis Left breast biopsy: Invasive ductal carcinoma with DCIS, axillary lymph node biopsy positive, ER 0%, PR 0%, HER-2 negative ratio 1.13 with Ki-67 75%   03/15/2015 PET scan 2.8 cm solid irregular left breast mass which is hypermetabolic and consistent with known left breast cancer. 2. Weakly positive axillary lymph nodes or equivocal   03/20/2015 Breast MRI 3.1 cm irregular enhancing mass located within the left breast at the 1 o'clock position; 1.6 cm Left axillary LN   03/22/2015 -  Neo-Adjuvant Chemotherapy Neo-adjuvant chemotherapy with dose dense Adriamycin and Cytoxan 4 followed by Abraxane weekly 12    CHIEF COMPLIANT: Abraxane week 9  INTERVAL HISTORY: Carla Bennett is a 80 year old with above-mentioned history of left breast cancer currently in the vaginal chemotherapy and she is here today to receive ninth week of Abraxane. She had issues with the port where there was an opening that was concerning. She denies any fevers or chills. Denies any nausea or vomiting. Denies any neuropathy.  REVIEW OF SYSTEMS:    Constitutional: Denies fevers, chills or abnormal weight loss Eyes: Denies blurriness of vision Ears, nose, mouth, throat, and face: Denies mucositis or sore throat Respiratory: Denies cough, dyspnea or wheezes Cardiovascular: Denies palpitation, chest discomfort or lower extremity swelling Gastrointestinal:  Denies nausea, heartburn or change in bowel habits Skin: Denies abnormal skin rashes Lymphatics: Denies new lymphadenopathy or easy bruising Neurological:Denies numbness, tingling or new weaknesses Behavioral/Psych: Mood is stable, no new changes   All other systems were reviewed with the patient and are negative.  I have reviewed the past medical history, past surgical history, social history and family history with the patient and they are unchanged from previous note.  ALLERGIES:  has No Known Allergies.  MEDICATIONS:  Current Outpatient Prescriptions  Medication Sig Dispense Refill  . Alum & Mag Hydroxide-Simeth (MAGIC MOUTHWASH W/LIDOCAINE) SOLN Take 5 mLs by mouth 3 (three) times daily. 100 mL 0  . B Complex Vitamins (VITAMIN B COMPLEX PO) Take 1 tablet by mouth daily.     Marland Kitchen dexamethasone (DECADRON) 4 MG tablet     . furosemide (LASIX) 20 MG tablet Take 20 mg by mouth daily.     Marland Kitchen labetalol (NORMODYNE) 100 MG tablet Take 1 tablet (100 mg total) by mouth daily. (Patient taking differently: Take 100 mg by mouth daily. Pt takes 1/2 tablet daily= 50 mg) 30 tablet 0  . lidocaine-prilocaine (EMLA) cream Apply to port-a-cath 1-2 hours prior to access. 30 g 3  . lisinopril (PRINIVIL,ZESTRIL) 40 MG tablet Take 40 mg by mouth daily.     Marland Kitchen loperamide (IMODIUM A-D) 2 MG tablet Take 2 mg by mouth 4 (  four) times daily as needed for diarrhea or loose stools (diarrhea).    . loratadine (CLARITIN) 10 MG tablet Take 10 mg by mouth daily.    Marland Kitchen LORazepam (ATIVAN) 0.5 MG tablet TAKE 1 TABLET BY MOUTH AT BEDTIME 30 tablet 0  . ondansetron (ZOFRAN) 8 MG tablet     . oxyCODONE-acetaminophen  (ROXICET) 5-325 MG per tablet Take 1 tablet by mouth every 4 (four) hours as needed. 30 tablet 0  . prochlorperazine (COMPAZINE) 10 MG tablet TAKE 1 TABLET BY MOUTH EVERY 6 HOURS AS NEEDED FOR NAUSEA AND VOMITING  1   No current facility-administered medications for this visit.   Facility-Administered Medications Ordered in Other Visits  Medication Dose Route Frequency Provider Last Rate Last Dose  . sodium chloride 0.9 % injection 10 mL  10 mL Intracatheter PRN Nicholas Lose, MD   10 mL at 06/07/15 1559    PHYSICAL EXAMINATION: ECOG PERFORMANCE STATUS: 1 - Symptomatic but completely ambulatory  Filed Vitals:   07/12/15 1319  BP: 175/89  Pulse: 97  Temp: 97.9 F (36.6 C)  Resp: 18   Filed Weights   07/12/15 1319  Weight: 130 lb 14.4 oz (59.376 kg)    GENERAL:alert, no distress and comfortable SKIN: skin color, texture, turgor are normal, no rashes or significant lesions EYES: normal, Conjunctiva are pink and non-injected, sclera clear OROPHARYNX:no exudate, no erythema and lips, buccal mucosa, and tongue normal  NECK: supple, thyroid normal size, non-tender, without nodularity LYMPH:  no palpable lymphadenopathy in the cervical, axillary or inguinal LUNGS: clear to auscultation and percussion with normal breathing effort HEART: regular rate & rhythm and no murmurs and no lower extremity edema ABDOMEN:abdomen soft, non-tender and normal bowel sounds Musculoskeletal:no cyanosis of digits and no clubbing  NEURO: alert & oriented x 3 with fluent speech, no focal motor/sensory deficits  LABORATORY DATA:  I have reviewed the data as listed   Chemistry      Component Value Date/Time   NA 141 07/05/2015 1247   NA 134* 04/15/2015 1556   K 3.6 07/05/2015 1247   K 3.7 04/15/2015 1556   CL 100* 04/15/2015 1556   CO2 29 07/05/2015 1247   CO2 26 04/15/2015 1556   BUN 10.1 07/05/2015 1247   BUN 10 04/15/2015 1556   CREATININE 0.9 07/05/2015 1247   CREATININE 1.18* 04/15/2015 1556       Component Value Date/Time   CALCIUM 10.8* 07/05/2015 1247   CALCIUM 10.5 05/24/2015 1508   ALKPHOS 64 07/05/2015 1247   ALKPHOS 69 03/16/2015 1133   AST 23 07/05/2015 1247   AST 22 03/16/2015 1133   ALT 15 07/05/2015 1247   ALT 18 03/16/2015 1133   BILITOT 0.49 07/05/2015 1247   BILITOT 0.6 03/16/2015 1133       Lab Results  Component Value Date   WBC 5.7 07/12/2015   HGB 11.4* 07/12/2015   HCT 34.2* 07/12/2015   MCV 91.8 07/12/2015   PLT 337 07/12/2015   NEUTROABS 3.8 07/12/2015   ASSESSMENT & PLAN:  Breast cancer of upper-outer quadrant of left female breast Left breast invasive ductal carcinoma grade 3 ER 0% PR 0% HER-2 negative Ki-67 75%, 2.9 cm mass with microcalcifications plus abnormal lymph nodes biopsy-proven to be breast cancer T2 N1 M0 stage IIB clinical stage PET-CT: Neg Breast MRI: 3.1 cm left breast mass, 1.6 cm Left Axillary LN  Treatment Plan:  1. Neoadjuvant chemotherapy: Dose dense Adriamycin and Cytoxan 4 followed by Abraxane weekly 12 2. Consideration for  Alliance clinical trial for surgery 3. Followed by adjuvant radiation Goal of treatment: Cure  Current treatment: Abraxane week 9/12 Chemotherapy toxicities: 1. Alopecia 2. Mouth sores : I prescribed Magic mouthwash and decrease the dosage of fourth Adriamycin and Cytoxan 3. Fatigue related to chemotherapy 4. Loss of taste 5. Anemia due to chemotherapy hemoglobin 10.5 today 6. Leucocytosis 7. Lip sore: resolved 8. Weight loss: Due to decreased taste.  9. Port issues: After last cycle of chemotherapy there was a small hole at the site of port access.  Hypertension: resumed half a tablet of labetalol daily. Blood pressure is significantly high today. She has an appointment to see her primary care physician tomorrow. She reports that at home her blood pressures always less than 140 x 80.  Return to clinic in 2 weeks for cycle 11/12 Abraxane    Orders Placed This Encounter   Procedures  . MR Breast Bilateral W Wo Contrast    Standing Status: Future     Number of Occurrences:      Standing Expiration Date: 09/10/2016    Order Specific Question:  Reason for Exam (SYMPTOM  OR DIAGNOSIS REQUIRED)    Answer:  Post neoadjuvant chemo    Order Specific Question:  Preferred imaging location?    Answer:  [id number]    Order Specific Question:  Does the patient have a pacemaker or implanted devices?    Answer:  No    Order Specific Question:  What is the patient's sedation requirement?    Answer:  No Sedation   The patient has a good understanding of the overall plan. she agrees with it. she will call with any problems that may develop before the next visit here.   Rulon Eisenmenger, MD

## 2015-07-12 NOTE — Patient Instructions (Signed)

## 2015-07-12 NOTE — Patient Instructions (Signed)
[facility] Discharge Instructions for Patients Receiving Chemotherapy  Today you received the following chemotherapy agents Abraxane To help prevent nausea and vomiting after your treatment, we encourage you to take your nausea medication as prescribed.   If you develop nausea and vomiting that is not controlled by your nausea medication, call the clinic.   BELOW ARE SYMPTOMS THAT SHOULD BE REPORTED IMMEDIATELY:  *FEVER GREATER THAN 100.5 F  *CHILLS WITH OR WITHOUT FEVER  NAUSEA AND VOMITING THAT IS NOT CONTROLLED WITH YOUR NAUSEA MEDICATION  *UNUSUAL SHORTNESS OF BREATH  *UNUSUAL BRUISING OR BLEEDING  TENDERNESS IN MOUTH AND THROAT WITH OR WITHOUT PRESENCE OF ULCERS  *URINARY PROBLEMS  *BOWEL PROBLEMS  UNUSUAL RASH Items with * indicate a potential emergency and should be followed up as soon as possible.  Feel free to call the clinic you have any questions or concerns. The clinic phone number is [phone].  Please show the CHEMO ALERT CARD at check-in to the Emergency Department and triage nurse.   

## 2015-07-12 NOTE — Telephone Encounter (Signed)
Gave avs & calendar for September/October. °

## 2015-07-13 DIAGNOSIS — I10 Essential (primary) hypertension: Secondary | ICD-10-CM | POA: Diagnosis not present

## 2015-07-13 DIAGNOSIS — N2581 Secondary hyperparathyroidism of renal origin: Secondary | ICD-10-CM | POA: Diagnosis not present

## 2015-07-13 DIAGNOSIS — N183 Chronic kidney disease, stage 3 (moderate): Secondary | ICD-10-CM | POA: Diagnosis not present

## 2015-07-19 ENCOUNTER — Other Ambulatory Visit (HOSPITAL_BASED_OUTPATIENT_CLINIC_OR_DEPARTMENT_OTHER): Payer: Medicare Other

## 2015-07-19 ENCOUNTER — Ambulatory Visit: Payer: Medicare Other

## 2015-07-19 ENCOUNTER — Other Ambulatory Visit: Payer: Medicare Other

## 2015-07-19 ENCOUNTER — Ambulatory Visit (HOSPITAL_BASED_OUTPATIENT_CLINIC_OR_DEPARTMENT_OTHER): Payer: Medicare Other

## 2015-07-19 VITALS — BP 136/65 | HR 76 | Temp 96.9°F | Resp 20

## 2015-07-19 DIAGNOSIS — C50412 Malignant neoplasm of upper-outer quadrant of left female breast: Secondary | ICD-10-CM

## 2015-07-19 DIAGNOSIS — Z5111 Encounter for antineoplastic chemotherapy: Secondary | ICD-10-CM

## 2015-07-19 LAB — COMPREHENSIVE METABOLIC PANEL (CC13)
ALT: 15 U/L (ref 0–55)
AST: 24 U/L (ref 5–34)
Albumin: 3.9 g/dL (ref 3.5–5.0)
Alkaline Phosphatase: 61 U/L (ref 40–150)
Anion Gap: 8 mEq/L (ref 3–11)
BILIRUBIN TOTAL: 0.55 mg/dL (ref 0.20–1.20)
BUN: 8.5 mg/dL (ref 7.0–26.0)
CHLORIDE: 106 meq/L (ref 98–109)
CO2: 27 meq/L (ref 22–29)
Calcium: 10.7 mg/dL — ABNORMAL HIGH (ref 8.4–10.4)
Creatinine: 1.1 mg/dL (ref 0.6–1.1)
EGFR: 50 mL/min/{1.73_m2} — AB (ref >90)
GLUCOSE: 88 mg/dL (ref 70–140)
Potassium: 3.8 mEq/L (ref 3.5–5.1)
SODIUM: 141 meq/L (ref 136–145)
TOTAL PROTEIN: 6.2 g/dL — AB (ref 6.4–8.3)

## 2015-07-19 LAB — CBC WITH DIFFERENTIAL/PLATELET
BASO%: 1.5 % (ref 0.0–2.0)
Basophils Absolute: 0.1 10*3/uL (ref 0.0–0.1)
EOS%: 2.5 % (ref 0.0–7.0)
Eosinophils Absolute: 0.1 10*3/uL (ref 0.0–0.5)
HCT: 34 % — ABNORMAL LOW (ref 34.8–46.6)
HGB: 11.1 g/dL — ABNORMAL LOW (ref 11.6–15.9)
LYMPH%: 24.3 % (ref 14.0–49.7)
MCH: 30.6 pg (ref 25.1–34.0)
MCHC: 32.6 g/dL (ref 31.5–36.0)
MCV: 93.7 fL (ref 79.5–101.0)
MONO#: 0.4 10*3/uL (ref 0.1–0.9)
MONO%: 8.7 % (ref 0.0–14.0)
NEUT%: 63 % (ref 38.4–76.8)
NEUTROS ABS: 3 10*3/uL (ref 1.5–6.5)
Platelets: 273 10*3/uL (ref 145–400)
RBC: 3.63 10*6/uL — AB (ref 3.70–5.45)
RDW: 15.3 % — ABNORMAL HIGH (ref 11.2–14.5)
WBC: 4.7 10*3/uL (ref 3.9–10.3)
lymph#: 1.2 10*3/uL (ref 0.9–3.3)

## 2015-07-19 MED ORDER — PACLITAXEL PROTEIN-BOUND CHEMO INJECTION 100 MG
65.0000 mg/m2 | Freq: Once | INTRAVENOUS | Status: AC
  Administered 2015-07-19: 100 mg via INTRAVENOUS
  Filled 2015-07-19: qty 20

## 2015-07-19 MED ORDER — PALONOSETRON HCL INJECTION 0.25 MG/5ML
INTRAVENOUS | Status: AC
  Filled 2015-07-19: qty 5

## 2015-07-19 MED ORDER — HEPARIN SOD (PORK) LOCK FLUSH 100 UNIT/ML IV SOLN
500.0000 [IU] | Freq: Once | INTRAVENOUS | Status: DC
  Filled 2015-07-19: qty 5

## 2015-07-19 MED ORDER — SODIUM CHLORIDE 0.9 % IJ SOLN
10.0000 mL | INTRAMUSCULAR | Status: DC | PRN
  Administered 2015-07-19: 10 mL
  Filled 2015-07-19: qty 10

## 2015-07-19 MED ORDER — SODIUM CHLORIDE 0.9 % IV SOLN
Freq: Once | INTRAVENOUS | Status: AC
  Administered 2015-07-19: 15:00:00 via INTRAVENOUS

## 2015-07-19 MED ORDER — PALONOSETRON HCL INJECTION 0.25 MG/5ML
0.2500 mg | Freq: Once | INTRAVENOUS | Status: AC
  Administered 2015-07-19: 0.25 mg via INTRAVENOUS

## 2015-07-19 MED ORDER — SODIUM CHLORIDE 0.9 % IJ SOLN
10.0000 mL | Freq: Once | INTRAMUSCULAR | Status: AC
  Administered 2015-07-19: 10 mL via INTRAVENOUS
  Filled 2015-07-19: qty 10

## 2015-07-19 MED ORDER — HEPARIN SOD (PORK) LOCK FLUSH 100 UNIT/ML IV SOLN
500.0000 [IU] | Freq: Once | INTRAVENOUS | Status: AC | PRN
  Administered 2015-07-19: 500 [IU]
  Filled 2015-07-19: qty 5

## 2015-07-19 NOTE — Patient Instructions (Signed)
Implanted Port Home Guide  An implanted port is a type of central line that is placed under the skin. Central lines are used to provide IV access when treatment or nutrition needs to be given through a person's veins. Implanted ports are used for long-term IV access. An implanted port may be placed because:    You need IV medicine that would be irritating to the small veins in your hands or arms.    You need long-term IV medicines, such as antibiotics.    You need IV nutrition for a long period.    You need frequent blood draws for lab tests.    You need dialysis.   Implanted ports are usually placed in the chest area, but they can also be placed in the upper arm, the abdomen, or the leg. An implanted port has two main parts:    Reservoir. The reservoir is round and will appear as a small, raised area under your skin. The reservoir is the part where a needle is inserted to give medicines or draw blood.    Catheter. The catheter is a thin, flexible tube that extends from the reservoir. The catheter is placed into a large vein. Medicine that is inserted into the reservoir goes into the catheter and then into the vein.   HOW WILL I CARE FOR MY INCISION SITE?  Do not get the incision site wet. Bathe or shower as directed by your health care Carla Bennett.   HOW IS MY PORT ACCESSED?  Special steps must be taken to access the port:    Before the port is accessed, a numbing cream can be placed on the skin. This helps numb the skin over the port site.    Your health care Carla Bennett uses a sterile technique to access the port.   Your health care Carla Bennett must put on a mask and sterile gloves.   The skin over your port is cleaned carefully with an antiseptic and allowed to dry.   The port is gently pinched between sterile gloves, and a needle is inserted into the port.   Only "non-coring" port needles should be used to access the port. Once the port is accessed, a blood return should be checked. This helps  ensure that the port is in the vein and is not clogged.    If your port needs to remain accessed for a constant infusion, a clear (transparent) bandage will be placed over the needle site. The bandage and needle will need to be changed every week, or as directed by your health care Carla Bennett.    Keep the bandage covering the needle clean and dry. Do not get it wet. Follow your health care Carla Bennett's instructions on how to take a shower or bath while the port is accessed.    If your port does not need to stay accessed, no bandage is needed over the port.   WHAT IS FLUSHING?  Flushing helps keep the port from getting clogged. Follow your health care Carla Bennett's instructions on how and when to flush the port. Ports are usually flushed with saline solution or a medicine called heparin. The need for flushing will depend on how the port is used.    If the port is used for intermittent medicines or blood draws, the port will need to be flushed:    After medicines have been given.    After blood has been drawn.    As part of routine maintenance.    If a constant infusion is   running, the port may not need to be flushed.   HOW LONG WILL MY PORT STAY IMPLANTED?  The port can stay in for as long as your health care Carla Bennett thinks it is needed. When it is time for the port to come out, surgery will be done to remove it. The procedure is similar to the one performed when the port was put in.   WHEN SHOULD I SEEK IMMEDIATE MEDICAL CARE?  When you have an implanted port, you should seek immediate medical care if:    You notice a bad smell coming from the incision site.    You have swelling, redness, or drainage at the incision site.    You have more swelling or pain at the port site or the surrounding area.    You have a fever that is not controlled with medicine.  Document Released: 10/06/2005 Document Revised: 07/27/2013 Document Reviewed: 06/13/2013  Exit[employer] Patient Information 2015 [employer]. This  information is not intended to replace advice given to you by your health care Carla Bennett. Make sure you discuss any questions you have with your health care Carla Bennett.

## 2015-07-19 NOTE — Patient Instructions (Signed)
[facility] Discharge Instructions for Patients Receiving Chemotherapy  Today you received the following chemotherapy agents: Abraxane   To help prevent nausea and vomiting after your treatment, we encourage you to take your nausea medication as directed.    If you develop nausea and vomiting that is not controlled by your nausea medication, call the clinic.   BELOW ARE SYMPTOMS THAT SHOULD BE REPORTED IMMEDIATELY:  *FEVER GREATER THAN 100.5 F  *CHILLS WITH OR WITHOUT FEVER  NAUSEA AND VOMITING THAT IS NOT CONTROLLED WITH YOUR NAUSEA MEDICATION  *UNUSUAL SHORTNESS OF BREATH  *UNUSUAL BRUISING OR BLEEDING  TENDERNESS IN MOUTH AND THROAT WITH OR WITHOUT PRESENCE OF ULCERS  *URINARY PROBLEMS  *BOWEL PROBLEMS  UNUSUAL RASH Items with * indicate a potential emergency and should be followed up as soon as possible.  Feel free to call the clinic you have any questions or concerns. The clinic phone number is [phone].  Please show the CHEMO ALERT CARD at check-in to the Emergency Department and triage nurse.   

## 2015-07-23 ENCOUNTER — Ambulatory Visit: Payer: Medicare Other | Admitting: Obstetrics & Gynecology

## 2015-07-25 NOTE — Assessment & Plan Note (Signed)
Left breast invasive ductal carcinoma grade 3 ER 0% PR 0% HER-2 negative Ki-67 75%, 2.9 cm mass with microcalcifications plus abnormal lymph nodes biopsy-proven to be breast cancer T2 N1 M0 stage IIB clinical stage PET-CT: Neg Breast MRI: 3.1 cm left breast mass, 1.6 cm Left Axillary LN  Treatment Plan:  1. Neoadjuvant chemotherapy: Dose dense Adriamycin and Cytoxan 4 followed by Abraxane weekly 12 2. Consideration for [facility] clinical trial for surgery 3. Followed by adjuvant radiation Goal of treatment: Cure  Current treatment: Abraxane week 11/12 Chemotherapy toxicities: 1. Alopecia 2. Mouth sores from Adriamycin and Cytoxan 3. Fatigue related to chemotherapy 4. Loss of taste 5. Anemia due to chemotherapy hemoglobin 10.5 today 6. Leucocytosis 7. Lip sore: resolved 8. Weight loss: Due to decreased taste.   Hypertension: resumed half a tablet of labetalol daily.  Plan: 1. Breast MRI scheduled for 08/06/2015 2. Tumor board presentation after that 3. Follow-up with as as well as surgery ( could be [facility] clinical trial candidate) Return to clinic 08/08/2015

## 2015-07-26 ENCOUNTER — Encounter: Payer: Self-pay | Admitting: *Deleted

## 2015-07-26 ENCOUNTER — Telehealth: Payer: Self-pay | Admitting: Hematology and Oncology

## 2015-07-26 ENCOUNTER — Ambulatory Visit: Payer: Medicare Other

## 2015-07-26 ENCOUNTER — Other Ambulatory Visit: Payer: Medicare Other

## 2015-07-26 ENCOUNTER — Ambulatory Visit (HOSPITAL_BASED_OUTPATIENT_CLINIC_OR_DEPARTMENT_OTHER): Payer: Medicare Other | Admitting: Hematology and Oncology

## 2015-07-26 ENCOUNTER — Other Ambulatory Visit (HOSPITAL_BASED_OUTPATIENT_CLINIC_OR_DEPARTMENT_OTHER): Payer: Medicare Other

## 2015-07-26 ENCOUNTER — Encounter: Payer: Self-pay | Admitting: Hematology and Oncology

## 2015-07-26 ENCOUNTER — Ambulatory Visit (HOSPITAL_BASED_OUTPATIENT_CLINIC_OR_DEPARTMENT_OTHER): Payer: Medicare Other

## 2015-07-26 VITALS — BP 130/60 | HR 78 | Temp 98.2°F | Resp 18 | Ht 61.0 in | Wt 132.6 lb

## 2015-07-26 DIAGNOSIS — Z95828 Presence of other vascular implants and grafts: Secondary | ICD-10-CM

## 2015-07-26 DIAGNOSIS — C50412 Malignant neoplasm of upper-outer quadrant of left female breast: Secondary | ICD-10-CM

## 2015-07-26 DIAGNOSIS — I10 Essential (primary) hypertension: Secondary | ICD-10-CM | POA: Insufficient documentation

## 2015-07-26 DIAGNOSIS — Z5111 Encounter for antineoplastic chemotherapy: Secondary | ICD-10-CM | POA: Diagnosis not present

## 2015-07-26 LAB — CBC WITH DIFFERENTIAL/PLATELET
BASO%: 1.3 % (ref 0.0–2.0)
Basophils Absolute: 0.1 10*3/uL (ref 0.0–0.1)
EOS%: 2.3 % (ref 0.0–7.0)
Eosinophils Absolute: 0.1 10*3/uL (ref 0.0–0.5)
HCT: 34.7 % — ABNORMAL LOW (ref 34.8–46.6)
HGB: 11.3 g/dL — ABNORMAL LOW (ref 11.6–15.9)
LYMPH%: 18.8 % (ref 14.0–49.7)
MCH: 30.5 pg (ref 25.1–34.0)
MCHC: 32.6 g/dL (ref 31.5–36.0)
MCV: 93.5 fL (ref 79.5–101.0)
MONO#: 0.4 10*3/uL (ref 0.1–0.9)
MONO%: 8 % (ref 0.0–14.0)
NEUT%: 69.6 % (ref 38.4–76.8)
NEUTROS ABS: 3.3 10*3/uL (ref 1.5–6.5)
Platelets: 268 10*3/uL (ref 145–400)
RBC: 3.71 10*6/uL (ref 3.70–5.45)
RDW: 15 % — ABNORMAL HIGH (ref 11.2–14.5)
WBC: 4.7 10*3/uL (ref 3.9–10.3)
lymph#: 0.9 10*3/uL (ref 0.9–3.3)

## 2015-07-26 LAB — COMPREHENSIVE METABOLIC PANEL (CC13)
ALT: 14 U/L (ref 0–55)
AST: 18 U/L (ref 5–34)
Albumin: 3.8 g/dL (ref 3.5–5.0)
Alkaline Phosphatase: 55 U/L (ref 40–150)
Anion Gap: 8 mEq/L (ref 3–11)
BILIRUBIN TOTAL: 0.49 mg/dL (ref 0.20–1.20)
BUN: 9.5 mg/dL (ref 7.0–26.0)
CHLORIDE: 107 meq/L (ref 98–109)
CO2: 25 meq/L (ref 22–29)
CREATININE: 1 mg/dL (ref 0.6–1.1)
Calcium: 10.5 mg/dL — ABNORMAL HIGH (ref 8.4–10.4)
EGFR: 53 mL/min/{1.73_m2} — AB (ref >90)
GLUCOSE: 122 mg/dL (ref 70–140)
Potassium: 3.5 mEq/L (ref 3.5–5.1)
SODIUM: 141 meq/L (ref 136–145)
TOTAL PROTEIN: 5.9 g/dL — AB (ref 6.4–8.3)

## 2015-07-26 MED ORDER — SODIUM CHLORIDE 0.9 % IJ SOLN
10.0000 mL | INTRAMUSCULAR | Status: DC | PRN
  Administered 2015-07-26: 10 mL via INTRAVENOUS
  Filled 2015-07-26: qty 10

## 2015-07-26 MED ORDER — PALONOSETRON HCL INJECTION 0.25 MG/5ML
INTRAVENOUS | Status: AC
  Filled 2015-07-26: qty 5

## 2015-07-26 MED ORDER — HEPARIN SOD (PORK) LOCK FLUSH 100 UNIT/ML IV SOLN
500.0000 [IU] | Freq: Once | INTRAVENOUS | Status: AC | PRN
  Administered 2015-07-26: 500 [IU]
  Filled 2015-07-26: qty 5

## 2015-07-26 MED ORDER — SODIUM CHLORIDE 0.9 % IV SOLN
Freq: Once | INTRAVENOUS | Status: AC
  Administered 2015-07-26: 11:00:00 via INTRAVENOUS

## 2015-07-26 MED ORDER — SODIUM CHLORIDE 0.9 % IJ SOLN
10.0000 mL | INTRAMUSCULAR | Status: DC | PRN
Start: 2015-07-26 — End: 2015-07-26
  Administered 2015-07-26: 10 mL
  Filled 2015-07-26: qty 10

## 2015-07-26 MED ORDER — PALONOSETRON HCL INJECTION 0.25 MG/5ML
0.2500 mg | Freq: Once | INTRAVENOUS | Status: AC
  Administered 2015-07-26: 0.25 mg via INTRAVENOUS

## 2015-07-26 MED ORDER — PACLITAXEL PROTEIN-BOUND CHEMO INJECTION 100 MG
65.0000 mg/m2 | Freq: Once | INTRAVENOUS | Status: AC
  Administered 2015-07-26: 100 mg via INTRAVENOUS
  Filled 2015-07-26: qty 20

## 2015-07-26 NOTE — Patient Instructions (Signed)

## 2015-07-26 NOTE — Telephone Encounter (Signed)
Appointments made and avs printed for patient °

## 2015-07-26 NOTE — Patient Instructions (Signed)
[facility] Discharge Instructions for Patients Receiving Chemotherapy  Today you received the following chemotherapy agents: Abraxane   To help prevent nausea and vomiting after your treatment, we encourage you to take your nausea medication as directed.    If you develop nausea and vomiting that is not controlled by your nausea medication, call the clinic.   BELOW ARE SYMPTOMS THAT SHOULD BE REPORTED IMMEDIATELY:  *FEVER GREATER THAN 100.5 F  *CHILLS WITH OR WITHOUT FEVER  NAUSEA AND VOMITING THAT IS NOT CONTROLLED WITH YOUR NAUSEA MEDICATION  *UNUSUAL SHORTNESS OF BREATH  *UNUSUAL BRUISING OR BLEEDING  TENDERNESS IN MOUTH AND THROAT WITH OR WITHOUT PRESENCE OF ULCERS  *URINARY PROBLEMS  *BOWEL PROBLEMS  UNUSUAL RASH Items with * indicate a potential emergency and should be followed up as soon as possible.  Feel free to call the clinic you have any questions or concerns. The clinic phone number is [phone].  Please show the CHEMO ALERT CARD at check-in to the Emergency Department and triage nurse.   

## 2015-07-26 NOTE — Progress Notes (Signed)
Patient Care Team: Thressa Sheller, MD as PCP - General (Internal Medicine) Erroll Luna, MD as Consulting Physician (General Surgery) Nicholas Lose, MD as Consulting Physician (Hematology and Oncology) Gery Pray, MD as Consulting Physician (Radiation Oncology) Mauro Kaufmann, RN as Registered Nurse Rockwell Germany, RN as Registered Nurse  DIAGNOSIS: Breast cancer of upper-outer quadrant of left female breast [facility])   Staging form: Breast, AJCC 7th Edition     Clinical stage from 03/07/2015: Stage IIB (T2, N1, M0) - Unsigned       Staging comments: Staged at breast conference on 5.18.16    SUMMARY OF ONCOLOGIC HISTORY:   Breast cancer of upper-outer quadrant of left female breast ([place])   02/15/2015 Mammogram Left breast increased density, mass is irregular with microcalcifications measuring 2.9 cm, cyst in the breast as well as abnormal enlarged lymph nodes   02/20/2015 Initial Diagnosis Left breast biopsy: Invasive ductal carcinoma with DCIS, axillary lymph node biopsy positive, ER 0%, PR 0%, HER-2 negative ratio 1.13 with Ki-67 75%   03/15/2015 PET scan 2.8 cm solid irregular left breast mass which is hypermetabolic and consistent with known left breast cancer. 2. Weakly positive axillary lymph nodes or equivocal   03/20/2015 Breast MRI 3.1 cm irregular enhancing mass located within the left breast at the 1 o'clock position; 1.6 cm Left axillary LN   03/22/2015 -  Neo-Adjuvant Chemotherapy Neo-adjuvant chemotherapy with dose dense Adriamycin and Cytoxan 4 followed by Abraxane weekly 12    CHIEF COMPLIANT: cycle 11 Abraxane  INTERVAL HISTORY: Carla Bennett is a 80 year old with above-mentioned history of left breast cancer currently on neo-adjuvant chemotherapy. She is on week 11 of Abraxane. Apart from fingernail changes, she does not complain of any tingling or numbness of her hands or feet. Denies any nausea or vomiting. She reports that the hair is starting to come back. Has  decreased appetite.  REVIEW OF SYSTEMS:   Constitutional: Denies fevers, chills or abnormal weight loss Eyes: Denies blurriness of vision Ears, nose, mouth, throat, and face: Denies mucositis or sore throat Respiratory: Denies cough, dyspnea or wheezes Cardiovascular: Denies palpitation, chest discomfort or lower extremity swelling Gastrointestinal:  Denies nausea, heartburn or change in bowel habits Skin: Denies abnormal skin rashes Lymphatics: Denies new lymphadenopathy or easy bruising Neurological:Denies numbness, tingling or new weaknesses Behavioral/Psych: Mood is stable, no new changes  All other systems were reviewed with the patient and are negative.  I have reviewed the past medical history, past surgical history, social history and family history with the patient and they are unchanged from previous note.  ALLERGIES:  has No Known Allergies.  MEDICATIONS:  Current Outpatient Prescriptions  Medication Sig Dispense Refill  . Alum & Mag Hydroxide-Simeth (MAGIC MOUTHWASH W/LIDOCAINE) SOLN Take 5 mLs by mouth 3 (three) times daily. 100 mL 0  . B Complex Vitamins (VITAMIN B COMPLEX PO) Take 1 tablet by mouth daily.     Marland Kitchen dexamethasone (DECADRON) 4 MG tablet     . furosemide (LASIX) 20 MG tablet Take 20 mg by mouth daily.     Marland Kitchen labetalol (NORMODYNE) 100 MG tablet Take 1 tablet (100 mg total) by mouth daily. (Patient taking differently: Take 100 mg by mouth daily. Pt takes 1/2 tablet daily= 50 mg) 30 tablet 0  . lidocaine-prilocaine (EMLA) cream Apply to port-a-cath 1-2 hours prior to access. 30 g 3  . lisinopril (PRINIVIL,ZESTRIL) 40 MG tablet Take 40 mg by mouth daily.     Marland Kitchen loperamide (IMODIUM A-D) 2 MG tablet  Take 2 mg by mouth 4 (four) times daily as needed for diarrhea or loose stools (diarrhea).    . loratadine (CLARITIN) 10 MG tablet Take 10 mg by mouth daily.    Marland Kitchen LORazepam (ATIVAN) 0.5 MG tablet TAKE 1 TABLET BY MOUTH AT BEDTIME 30 tablet 0  . ondansetron (ZOFRAN) 8 MG  tablet     . oxyCODONE-acetaminophen (ROXICET) 5-325 MG per tablet Take 1 tablet by mouth every 4 (four) hours as needed. 30 tablet 0  . prochlorperazine (COMPAZINE) 10 MG tablet TAKE 1 TABLET BY MOUTH EVERY 6 HOURS AS NEEDED FOR NAUSEA AND VOMITING  1   No current facility-administered medications for this visit.   Facility-Administered Medications Ordered in Other Visits  Medication Dose Route Frequency Provider Last Rate Last Dose  . sodium chloride 0.9 % injection 10 mL  10 mL Intracatheter PRN Nicholas Lose, MD   10 mL at 06/07/15 1559    PHYSICAL EXAMINATION: ECOG PERFORMANCE STATUS: 1 - Symptomatic but completely ambulatory  Filed Vitals:   07/26/15 0956  BP: 130/60  Pulse: 78  Temp: 98.2 F (36.8 C)  Resp: 18   Filed Weights   07/26/15 0956  Weight: 132 lb 9.6 oz (60.147 kg)    GENERAL:alert, no distress and comfortable SKIN: skin color, texture, turgor are normal, no rashes or significant lesions EYES: normal, Conjunctiva are pink and non-injected, sclera clear OROPHARYNX:no exudate, no erythema and lips, buccal mucosa, and tongue normal  NECK: supple, thyroid normal size, non-tender, without nodularity LYMPH:  no palpable lymphadenopathy in the cervical, axillary or inguinal LUNGS: clear to auscultation and percussion with normal breathing effort HEART: regular rate & rhythm and no murmurs and no lower extremity edema ABDOMEN:abdomen soft, non-tender and normal bowel sounds Musculoskeletal:no cyanosis of digits and no clubbing  NEURO: alert & oriented x 3 with fluent speech, no focal motor/sensory deficits  LABORATORY DATA:  I have reviewed the data as listed   Chemistry      Component Value Date/Time   NA 141 07/19/2015 1312   NA 134* 04/15/2015 1556   K 3.8 07/19/2015 1312   K 3.7 04/15/2015 1556   CL 100* 04/15/2015 1556   CO2 27 07/19/2015 1312   CO2 26 04/15/2015 1556   BUN 8.5 07/19/2015 1312   BUN 10 04/15/2015 1556   CREATININE 1.1 07/19/2015 1312    CREATININE 1.18* 04/15/2015 1556      Component Value Date/Time   CALCIUM 10.7* 07/19/2015 1312   CALCIUM 10.5 05/24/2015 1508   ALKPHOS 61 07/19/2015 1312   ALKPHOS 69 03/16/2015 1133   AST 24 07/19/2015 1312   AST 22 03/16/2015 1133   ALT 15 07/19/2015 1312   ALT 18 03/16/2015 1133   BILITOT 0.55 07/19/2015 1312   BILITOT 0.6 03/16/2015 1133       Lab Results  Component Value Date   WBC 4.7 07/26/2015   HGB 11.3* 07/26/2015   HCT 34.7* 07/26/2015   MCV 93.5 07/26/2015   PLT 268 07/26/2015   NEUTROABS 3.3 07/26/2015   ASSESSMENT & PLAN:  Breast cancer of upper-outer quadrant of left female breast Left breast invasive ductal carcinoma grade 3 ER 0% PR 0% HER-2 negative Ki-67 75%, 2.9 cm mass with microcalcifications plus abnormal lymph nodes biopsy-proven to be breast cancer T2 N1 M0 stage IIB clinical stage PET-CT: Neg Breast MRI: 3.1 cm left breast mass, 1.6 cm Left Axillary LN  Treatment Plan:  1. Neoadjuvant chemotherapy: Dose dense Adriamycin and Cytoxan 4 followed by  Abraxane weekly 12 2. Consideration for [facility] clinical trial for surgery 3. Followed by adjuvant radiation Goal of treatment: Cure  Current treatment: Abraxane week 11/12 Chemotherapy toxicities: 1. Alopecia 2. Mouth sores from Adriamycin and Cytoxan 3. Fatigue related to chemotherapy 4. Loss of taste 5. Anemia due to chemotherapy hemoglobin 11.4 today 6. Leucocytosis 7. Lip sore: resolved 8. Weight loss: Due to decreased taste.  9. Name discoloration and dryness: Encouraged her to use coconut oil.  Hypertension: resumed half a tablet of labetalol daily.  Blood pressure is now 130/60 Plan: 1. Breast MRI scheduled for 08/06/2015 2. Tumor board presentation after that 3. Follow-up with surgery Dr. Brantley Stage ( could be [facility] clinical trial candidate if she has a good response) Return to clinic 08/08/2015   No orders of the defined types were placed in this encounter.   The  patient has a good understanding of the overall plan. she agrees with it. she will call with any problems that may develop before the next visit here.   Rulon Eisenmenger, MD

## 2015-08-01 ENCOUNTER — Other Ambulatory Visit: Payer: Self-pay | Admitting: *Deleted

## 2015-08-01 DIAGNOSIS — C50412 Malignant neoplasm of upper-outer quadrant of left female breast: Secondary | ICD-10-CM

## 2015-08-02 ENCOUNTER — Encounter: Payer: Self-pay | Admitting: *Deleted

## 2015-08-02 ENCOUNTER — Other Ambulatory Visit (HOSPITAL_BASED_OUTPATIENT_CLINIC_OR_DEPARTMENT_OTHER): Payer: Medicare Other

## 2015-08-02 ENCOUNTER — Ambulatory Visit: Payer: Medicare Other

## 2015-08-02 ENCOUNTER — Other Ambulatory Visit: Payer: Medicare Other

## 2015-08-02 ENCOUNTER — Ambulatory Visit (HOSPITAL_BASED_OUTPATIENT_CLINIC_OR_DEPARTMENT_OTHER): Payer: Medicare Other

## 2015-08-02 VITALS — BP 149/72 | HR 84 | Temp 97.8°F | Resp 16

## 2015-08-02 DIAGNOSIS — D6481 Anemia due to antineoplastic chemotherapy: Secondary | ICD-10-CM | POA: Diagnosis not present

## 2015-08-02 DIAGNOSIS — C50412 Malignant neoplasm of upper-outer quadrant of left female breast: Secondary | ICD-10-CM

## 2015-08-02 DIAGNOSIS — D72828 Other elevated white blood cell count: Secondary | ICD-10-CM | POA: Diagnosis not present

## 2015-08-02 DIAGNOSIS — Z5111 Encounter for antineoplastic chemotherapy: Secondary | ICD-10-CM

## 2015-08-02 DIAGNOSIS — Z95828 Presence of other vascular implants and grafts: Secondary | ICD-10-CM

## 2015-08-02 LAB — CBC WITH DIFFERENTIAL/PLATELET
BASO%: 1.3 % (ref 0.0–2.0)
Basophils Absolute: 0.1 10*3/uL (ref 0.0–0.1)
EOS ABS: 0.1 10*3/uL (ref 0.0–0.5)
EOS%: 3 % (ref 0.0–7.0)
HCT: 33 % — ABNORMAL LOW (ref 34.8–46.6)
HEMOGLOBIN: 11.1 g/dL — AB (ref 11.6–15.9)
LYMPH%: 28 % (ref 14.0–49.7)
MCH: 30.6 pg (ref 25.1–34.0)
MCHC: 33.5 g/dL (ref 31.5–36.0)
MCV: 91.5 fL (ref 79.5–101.0)
MONO#: 0.5 10*3/uL (ref 0.1–0.9)
MONO%: 9.7 % (ref 0.0–14.0)
NEUT%: 58 % (ref 38.4–76.8)
NEUTROS ABS: 2.7 10*3/uL (ref 1.5–6.5)
Platelets: 301 10*3/uL (ref 145–400)
RBC: 3.61 10*6/uL — AB (ref 3.70–5.45)
RDW: 16.4 % — AB (ref 11.2–14.5)
WBC: 4.7 10*3/uL (ref 3.9–10.3)
lymph#: 1.3 10*3/uL (ref 0.9–3.3)

## 2015-08-02 LAB — COMPREHENSIVE METABOLIC PANEL (CC13)
ALBUMIN: 3.9 g/dL (ref 3.5–5.0)
ALT: 17 U/L (ref 0–55)
ANION GAP: 9 meq/L (ref 3–11)
AST: 25 U/L (ref 5–34)
Alkaline Phosphatase: 64 U/L (ref 40–150)
BILIRUBIN TOTAL: 0.58 mg/dL (ref 0.20–1.20)
BUN: 13.4 mg/dL (ref 7.0–26.0)
CO2: 26 meq/L (ref 22–29)
CREATININE: 1 mg/dL (ref 0.6–1.1)
Calcium: 10.5 mg/dL — ABNORMAL HIGH (ref 8.4–10.4)
Chloride: 108 mEq/L (ref 98–109)
EGFR: 55 mL/min/{1.73_m2} — ABNORMAL LOW (ref >90)
GLUCOSE: 95 mg/dL (ref 70–140)
Potassium: 3.4 mEq/L — ABNORMAL LOW (ref 3.5–5.1)
SODIUM: 142 meq/L (ref 136–145)
TOTAL PROTEIN: 6.1 g/dL — AB (ref 6.4–8.3)

## 2015-08-02 MED ORDER — PALONOSETRON HCL INJECTION 0.25 MG/5ML
INTRAVENOUS | Status: AC
  Filled 2015-08-02: qty 5

## 2015-08-02 MED ORDER — SODIUM CHLORIDE 0.9 % IJ SOLN
10.0000 mL | INTRAMUSCULAR | Status: DC | PRN
  Administered 2015-08-02: 10 mL via INTRAVENOUS
  Filled 2015-08-02: qty 10

## 2015-08-02 MED ORDER — SODIUM CHLORIDE 0.9 % IV SOLN
Freq: Once | INTRAVENOUS | Status: AC
  Administered 2015-08-02: 14:00:00 via INTRAVENOUS

## 2015-08-02 MED ORDER — PACLITAXEL PROTEIN-BOUND CHEMO INJECTION 100 MG
65.0000 mg/m2 | Freq: Once | INTRAVENOUS | Status: AC
  Administered 2015-08-02: 100 mg via INTRAVENOUS
  Filled 2015-08-02: qty 20

## 2015-08-02 MED ORDER — HEPARIN SOD (PORK) LOCK FLUSH 100 UNIT/ML IV SOLN
500.0000 [IU] | Freq: Once | INTRAVENOUS | Status: AC | PRN
  Administered 2015-08-02: 500 [IU]
  Filled 2015-08-02: qty 5

## 2015-08-02 MED ORDER — PALONOSETRON HCL INJECTION 0.25 MG/5ML
0.2500 mg | Freq: Once | INTRAVENOUS | Status: AC
  Administered 2015-08-02: 0.25 mg via INTRAVENOUS

## 2015-08-02 MED ORDER — SODIUM CHLORIDE 0.9 % IJ SOLN
10.0000 mL | INTRAMUSCULAR | Status: DC | PRN
  Administered 2015-08-02: 10 mL
  Filled 2015-08-02: qty 10

## 2015-08-02 NOTE — Patient Instructions (Signed)

## 2015-08-02 NOTE — Patient Instructions (Addendum)
[facility] Discharge Instructions for Patients Receiving Chemotherapy  Today you received the following chemotherapy agents abraxane  To help prevent nausea and vomiting after your treatment, we encourage you to take your nausea medication as directed   If you develop nausea and vomiting that is not controlled by your nausea medication, call the clinic.   BELOW ARE SYMPTOMS THAT SHOULD BE REPORTED IMMEDIATELY:  *FEVER GREATER THAN 100.5 F  *CHILLS WITH OR WITHOUT FEVER  NAUSEA AND VOMITING THAT IS NOT CONTROLLED WITH YOUR NAUSEA MEDICATION  *UNUSUAL SHORTNESS OF BREATH  *UNUSUAL BRUISING OR BLEEDING  TENDERNESS IN MOUTH AND THROAT WITH OR WITHOUT PRESENCE OF ULCERS  *URINARY PROBLEMS  *BOWEL PROBLEMS  UNUSUAL RASH Items with * indicate a potential emergency and should be followed up as soon as possible.  Feel free to call the clinic you have any questions or concerns. The clinic phone number is [phone].  Hypokalemia Hypokalemia means that the amount of potassium in the blood is lower than normal.Potassium is a chemical, called an electrolyte, that helps regulate the amount of fluid in the body. It also stimulates muscle contraction and helps nerves function properly.Most of the body's potassium is inside of cells, and only a very small amount is in the blood. Because the amount in the blood is so small, minor changes can be life-threatening. CAUSES  Antibiotics.  Diarrhea or vomiting.  Using laxatives too much, which can cause diarrhea.  Chronic kidney disease.  Water pills (diuretics).  Eating disorders (bulimia).  Low magnesium level.  Sweating a lot. SIGNS AND SYMPTOMS  Weakness.  Constipation.  Fatigue.  Muscle cramps.  Mental confusion.  Skipped heartbeats or irregular heartbeat (palpitations).  Tingling or numbness. DIAGNOSIS  Your health care provider can diagnose hypokalemia with blood tests. In addition to checking  your potassium level, your health care provider may also check other lab tests. TREATMENT Hypokalemia can be treated with potassium supplements taken by mouth or adjustments in your current medicines. If your potassium level is very low, you may need to get potassium through a vein (IV) and be monitored in the hospital. A diet high in potassium is also helpful. Foods high in potassium are:  Nuts, such as peanuts and pistachios.  Seeds, such as sunflower seeds and pumpkin seeds.  Peas, lentils, and lima beans.  Whole grain and bran cereals and breads.  Fresh fruit and vegetables, such as apricots, avocado, bananas, cantaloupe, kiwi, oranges, tomatoes, asparagus, and potatoes.  Orange and tomato juices.  Red meats.  Fruit yogurt. HOME CARE INSTRUCTIONS  Take all medicines as prescribed by your health care provider.  Maintain a healthy diet by including nutritious food, such as fruits, vegetables, nuts, whole grains, and lean meats.  If you are taking a laxative, be sure to follow the directions on the label. SEEK MEDICAL CARE IF:  Your weakness gets worse.  You feel your heart pounding or racing.  You are vomiting or having diarrhea.  You are diabetic and having trouble keeping your blood glucose in the normal range. SEEK IMMEDIATE MEDICAL CARE IF:  You have chest pain, shortness of breath, or dizziness.  You are vomiting or having diarrhea for more than 2 days.  You faint. MAKE SURE YOU:   Understand these instructions.  Will watch your condition.  Will get help right away if you are not doing well or get worse.   This information is not intended to replace advice given to you by your health care provider.  Make sure you discuss any questions you have with your health care provider.   Document Released: 10/06/2005 Document Revised: 10/27/2014 Document Reviewed: 04/08/2013 Elsevier Interactive Patient Education [employer].

## 2015-08-06 ENCOUNTER — Ambulatory Visit
Admission: RE | Admit: 2015-08-06 | Discharge: 2015-08-06 | Disposition: A | Discharge status: Home / Self Care | Payer: Medicare Other | Source: Ambulatory Visit | Attending: Hematology and Oncology | Admitting: Hematology and Oncology

## 2015-08-06 DIAGNOSIS — C50412 Malignant neoplasm of upper-outer quadrant of left female breast: Secondary | ICD-10-CM

## 2015-08-06 DIAGNOSIS — C773 Secondary and unspecified malignant neoplasm of axilla and upper limb lymph nodes: Secondary | ICD-10-CM | POA: Diagnosis not present

## 2015-08-06 MED ORDER — GADOBENATE DIMEGLUMINE 529 MG/ML IV SOLN: 12.0000 mL | Freq: Once | INTRAVENOUS | Status: DC | PRN

## 2015-08-07 ENCOUNTER — Other Ambulatory Visit: Payer: Self-pay

## 2015-08-07 DIAGNOSIS — C50412 Malignant neoplasm of upper-outer quadrant of left female breast: Secondary | ICD-10-CM

## 2015-08-07 NOTE — Assessment & Plan Note (Signed)
Left breast invasive ductal carcinoma grade 3 ER 0% PR 0% HER-2 negative Ki-67 75%, 2.9 cm mass with microcalcifications plus abnormal lymph nodes biopsy-proven to be breast cancer T2 N1 M0 stage IIB clinical stage PET-CT: Neg Breast MRI: 3.1 cm left breast mass, 1.6 cm Left Axillary LN S/P Neoadjuvant Dose dense AC folll by Abraxane X 12  left breast MRI: now measures 0.9 x 0.8 x 1.9 cm (previously measured 3.0 x 3.1 x 1.8 cm). Decreased size of abnormal Left axill LN  Recommendation: 1. Follow-up with surgery Dr. Brantley Stage ( could be [facility] clinical trial candidate) RTC after surgery

## 2015-08-08 ENCOUNTER — Other Ambulatory Visit (HOSPITAL_BASED_OUTPATIENT_CLINIC_OR_DEPARTMENT_OTHER): Payer: Medicare Other

## 2015-08-08 ENCOUNTER — Ambulatory Visit (HOSPITAL_BASED_OUTPATIENT_CLINIC_OR_DEPARTMENT_OTHER): Payer: Medicare Other | Admitting: Hematology and Oncology

## 2015-08-08 ENCOUNTER — Encounter: Payer: Self-pay | Admitting: Hematology and Oncology

## 2015-08-08 VITALS — BP 125/68 | HR 75 | Temp 98.1°F | Resp 17 | Ht 61.0 in | Wt 131.3 lb

## 2015-08-08 DIAGNOSIS — C50412 Malignant neoplasm of upper-outer quadrant of left female breast: Secondary | ICD-10-CM

## 2015-08-08 DIAGNOSIS — C773 Secondary and unspecified malignant neoplasm of axilla and upper limb lymph nodes: Secondary | ICD-10-CM | POA: Diagnosis not present

## 2015-08-08 DIAGNOSIS — Z171 Estrogen receptor negative status [ER-]: Secondary | ICD-10-CM | POA: Diagnosis not present

## 2015-08-08 LAB — CBC WITH DIFFERENTIAL/PLATELET
BASO%: 1 % (ref 0.0–2.0)
Basophils Absolute: 0.1 10*3/uL (ref 0.0–0.1)
EOS ABS: 0.1 10*3/uL (ref 0.0–0.5)
EOS%: 2.7 % (ref 0.0–7.0)
HEMATOCRIT: 37 % (ref 34.8–46.6)
HEMOGLOBIN: 12.2 g/dL (ref 11.6–15.9)
LYMPH#: 1.2 10*3/uL (ref 0.9–3.3)
LYMPH%: 25.4 % (ref 14.0–49.7)
MCH: 30.3 pg (ref 25.1–34.0)
MCHC: 33 g/dL (ref 31.5–36.0)
MCV: 91.8 fL (ref 79.5–101.0)
MONO#: 0.4 10*3/uL (ref 0.1–0.9)
MONO%: 9 % (ref 0.0–14.0)
NEUT%: 61.9 % (ref 38.4–76.8)
NEUTROS ABS: 3 10*3/uL (ref 1.5–6.5)
PLATELETS: 276 10*3/uL (ref 145–400)
RBC: 4.03 10*6/uL (ref 3.70–5.45)
RDW: 14.7 % — AB (ref 11.2–14.5)
WBC: 4.8 10*3/uL (ref 3.9–10.3)

## 2015-08-08 LAB — COMPREHENSIVE METABOLIC PANEL (CC13)
ALBUMIN: 3.9 g/dL (ref 3.5–5.0)
ALK PHOS: 64 U/L (ref 40–150)
ALT: 14 U/L (ref 0–55)
ANION GAP: 9 meq/L (ref 3–11)
AST: 20 U/L (ref 5–34)
BILIRUBIN TOTAL: 0.65 mg/dL (ref 0.20–1.20)
BUN: 9 mg/dL (ref 7.0–26.0)
CO2: 24 meq/L (ref 22–29)
CREATININE: 1 mg/dL (ref 0.6–1.1)
Calcium: 10.9 mg/dL — ABNORMAL HIGH (ref 8.4–10.4)
Chloride: 109 mEq/L (ref 98–109)
EGFR: 57 mL/min/{1.73_m2} — ABNORMAL LOW (ref >90)
Glucose: 116 mg/dl (ref 70–140)
Potassium: 3.6 mEq/L (ref 3.5–5.1)
Sodium: 142 mEq/L (ref 136–145)
TOTAL PROTEIN: 6.3 g/dL — AB (ref 6.4–8.3)

## 2015-08-08 NOTE — Addendum Note (Signed)
Addended by: Prentiss Bells on: 08/08/2015 05:34 PM   Modules accepted: Medications

## 2015-08-08 NOTE — Progress Notes (Signed)
Patient Care Team: Thressa Sheller, MD as PCP - General (Internal Medicine) Erroll Luna, MD as Consulting Physician (General Surgery) Nicholas Lose, MD as Consulting Physician (Hematology and Oncology) Gery Pray, MD as Consulting Physician (Radiation Oncology) Mauro Kaufmann, RN as Registered Nurse Rockwell Germany, RN as Registered Nurse  DIAGNOSIS: Breast cancer of upper-outer quadrant of left female breast [facility])   Staging form: Breast, AJCC 7th Edition     Clinical stage from 03/07/2015: Stage IIB (T2, N1, M0) - Unsigned       Staging comments: Staged at breast conference on 5.18.16    SUMMARY OF ONCOLOGIC HISTORY:   Breast cancer of upper-outer quadrant of left female breast ([place])   02/15/2015 Mammogram Left breast increased density, mass is irregular with microcalcifications measuring 2.9 cm, cyst in the breast as well as abnormal enlarged lymph nodes   02/20/2015 Initial Diagnosis Left breast biopsy: Invasive ductal carcinoma with DCIS, axillary lymph node biopsy positive, ER 0%, PR 0%, HER-2 negative ratio 1.13 with Ki-67 75%   03/15/2015 PET scan 2.8 cm solid irregular left breast mass which is hypermetabolic and consistent with known left breast cancer. 2. Weakly positive axillary lymph nodes or equivocal   03/20/2015 Breast MRI 3.1 cm irregular enhancing mass located within the left breast at the 1 o'clock position; 1.6 cm Left axillary LN   03/22/2015 - 08/02/2015 Neo-Adjuvant Chemotherapy Neo-adjuvant chemotherapy with dose dense Adriamycin and Cytoxan 4 followed by Abraxane weekly 12   08/06/2015 Breast MRI left breast now measures 0.9 x 0.8 x 1.9 cm (previously measured 3.0 x 3.1 x 1.8 cm). Dec size of abnormal Left axill LN    CHIEF COMPLIANT: follow-up to discuss the breast MRI  INTERVAL HISTORY: Rona Tomson Lighty is a 80 year old with above-mentioned history of left breast cancer treated with neoadjuvant chemotherapy and is here today to discuss the breast MRI report. She  is doing slightly better with improvement in fatigue but still has decreased appetite and taste. Denies any nausea vomiting.  REVIEW OF SYSTEMS:   Constitutional: Denies fevers, chills or abnormal weight loss Eyes: Denies blurriness of vision Ears, nose, mouth, throat, and face: Denies mucositis or sore throat Respiratory: Denies cough, dyspnea or wheezes Cardiovascular: Denies palpitation, chest discomfort or lower extremity swelling Gastrointestinal:  Denies nausea, heartburn or change in bowel habits Skin: Denies abnormal skin rashes Lymphatics: Denies new lymphadenopathy or easy bruising Neurological:Denies numbness, tingling or new weaknesses Behavioral/Psych: Mood is stable, no new changes   All other systems were reviewed with the patient and are negative.  I have reviewed the past medical history, past surgical history, social history and family history with the patient and they are unchanged from previous note.  ALLERGIES:  has No Known Allergies.  MEDICATIONS:  Current Outpatient Prescriptions  Medication Sig Dispense Refill  . Alum & Mag Hydroxide-Simeth (MAGIC MOUTHWASH W/LIDOCAINE) SOLN Take 5 mLs by mouth 3 (three) times daily. 100 mL 0  . B Complex Vitamins (VITAMIN B COMPLEX PO) Take 1 tablet by mouth daily.     Marland Kitchen dexamethasone (DECADRON) 4 MG tablet     . furosemide (LASIX) 20 MG tablet Take 20 mg by mouth daily.     Marland Kitchen labetalol (NORMODYNE) 100 MG tablet Take 1 tablet (100 mg total) by mouth daily. (Patient taking differently: Take 100 mg by mouth daily. Pt takes 1/2 tablet daily= 50 mg) 30 tablet 0  . lidocaine-prilocaine (EMLA) cream Apply to port-a-cath 1-2 hours prior to access. 30 g 3  . lisinopril (  PRINIVIL,ZESTRIL) 40 MG tablet Take 40 mg by mouth daily.     Marland Kitchen loperamide (IMODIUM A-D) 2 MG tablet Take 2 mg by mouth 4 (four) times daily as needed for diarrhea or loose stools (diarrhea).    . loratadine (CLARITIN) 10 MG tablet Take 10 mg by mouth daily.    Marland Kitchen  LORazepam (ATIVAN) 0.5 MG tablet TAKE 1 TABLET BY MOUTH AT BEDTIME 30 tablet 0  . ondansetron (ZOFRAN) 8 MG tablet     . oxyCODONE-acetaminophen (ROXICET) 5-325 MG per tablet Take 1 tablet by mouth every 4 (four) hours as needed. 30 tablet 0  . prochlorperazine (COMPAZINE) 10 MG tablet TAKE 1 TABLET BY MOUTH EVERY 6 HOURS AS NEEDED FOR NAUSEA AND VOMITING  1   No current facility-administered medications for this visit.   Facility-Administered Medications Ordered in Other Visits  Medication Dose Route Frequency Provider Last Rate Last Dose  . sodium chloride 0.9 % injection 10 mL  10 mL Intracatheter PRN Nicholas Lose, MD   10 mL at 06/07/15 1559    PHYSICAL EXAMINATION: ECOG PERFORMANCE STATUS: 1 - Symptomatic but completely ambulatory  Filed Vitals:   08/08/15 0854  BP: 125/68  Pulse: 75  Temp: 98.1 F (36.7 C)  Resp: 17   Filed Weights   08/08/15 0854  Weight: 131 lb 4.8 oz (59.557 kg)    GENERAL:alert, no distress and comfortable SKIN: skin color, texture, turgor are normal, no rashes or significant lesions EYES: normal, Conjunctiva are pink and non-injected, sclera clear OROPHARYNX:no exudate, no erythema and lips, buccal mucosa, and tongue normal  NECK: supple, thyroid normal size, non-tender, without nodularity LYMPH:  no palpable lymphadenopathy in the cervical, axillary or inguinal LUNGS: clear to auscultation and percussion with normal breathing effort HEART: regular rate & rhythm and no murmurs and no lower extremity edema ABDOMEN:abdomen soft, non-tender and normal bowel sounds Musculoskeletal:no cyanosis of digits and no clubbing  NEURO: alert & oriented x 3 with fluent speech, no focal motor/sensory deficits  LABORATORY DATA:  I have reviewed the data as listed   Chemistry      Component Value Date/Time   NA 142 08/08/2015 0842   NA 134* 04/15/2015 1556   K 3.6 08/08/2015 0842   K 3.7 04/15/2015 1556   CL 100* 04/15/2015 1556   CO2 24 08/08/2015 0842    CO2 26 04/15/2015 1556   BUN 9.0 08/08/2015 0842   BUN 10 04/15/2015 1556   CREATININE 1.0 08/08/2015 0842   CREATININE 1.18* 04/15/2015 1556      Component Value Date/Time   CALCIUM 10.9* 08/08/2015 0842   CALCIUM 10.5 05/24/2015 1508   ALKPHOS 64 08/08/2015 0842   ALKPHOS 69 03/16/2015 1133   AST 20 08/08/2015 0842   AST 22 03/16/2015 1133   ALT 14 08/08/2015 0842   ALT 18 03/16/2015 1133   BILITOT 0.65 08/08/2015 0842   BILITOT 0.6 03/16/2015 1133       Lab Results  Component Value Date   WBC 4.8 08/08/2015   HGB 12.2 08/08/2015   HCT 37.0 08/08/2015   MCV 91.8 08/08/2015   PLT 276 08/08/2015   NEUTROABS 3.0 08/08/2015     RADIOGRAPHIC STUDIES: I have personally reviewed the radiology reports and agreed with their findings. Mr Breast Bilateral W Wo Contrast  08/06/2015  CLINICAL DATA:  Biopsy-proven left breast cancer, with metastasis to a left axillary lymph node. Diagnosis was made in April 2016 following ultrasound-guided biopsies of the left breast mass and suspicious left  axillary lymph node. Status post neoadjuvant chemotherapy. Evaluate response. LABS:  None obtained EXAM: BILATERAL BREAST MRI WITH AND WITHOUT CONTRAST TECHNIQUE: Multiplanar, multisequence MR images of both breasts were obtained prior to and following the intravenous administration of 12 ml of MultiHance. THREE-DIMENSIONAL MR IMAGE RENDERING ON INDEPENDENT WORKSTATION: Three-dimensional MR images were rendered by post-processing of the original MR data on an independent workstation. The three-dimensional MR images were interpreted, and findings are reported in the following complete MRI report for this study. Three dimensional images were evaluated at the independent DynaCad workstation COMPARISON:  Previous bilateral mammogram and left breast ultrasound from [place] in April 2016. Bilateral breast MRI dated 03/16/2015. FINDINGS: Breast composition: b. Scattered fibroglandular tissue.  Background parenchymal enhancement: Mild Right breast: No mass or abnormal enhancement. Left breast: The biopsy-proven malignancy in the upper slightly outer left breast has decreased in size following neoadjuvant chemotherapy. The mass is irregular in shape and measures approximately 0.9 cm AP diameter x 0.8 cm transverse diameter x 1.9 cm craniocaudal span on today's exam. Washout kinetics are seen within the enhancing mass. Biopsy clip artifact is seen approximately 8 mm directly lateral to the mass. On the prior MRI on dated 03/16/2015, this mass was measured to be 3.0 x 3.1 x 1.8 cm in size. No new suspicious areas of enhancement in the left breast. Two simple cysts are seen in the upper left breast. Lymph nodes: The previously described left axillary lymph node with focal cortical thickening, and shown to be involved with metastatic disease on prior biopsy has decreased in size. Cortical thickness is now within normal limits. Ancillary findings: Small simple bilateral pleural effusions are seen layering dependently. IMPRESSION: 1. Positive response to neoadjuvant chemotherapy. The biopsy-proven malignancy in the upper outer left breast now measures 0.9 x 0.8 x 1.9 cm (previously measured 3.0 x 3.1 x 1.8 cm). 2. Interval decrease in size of the previously described abnormal left axillary lymph node, which was shown to be involved with metastatic disease on prior biopsy. 3. No MRI evidence of malignancy in the right breast. 4. Small bilateral pleural effusions. RECOMMENDATION: Continue treatment planning. BI-RADS CATEGORY  6: Known biopsy-proven malignancy. Electronically Signed   By: Curlene Dolphin M.D.   On: 08/06/2015 15:34     ASSESSMENT & PLAN:  Breast cancer of upper-outer quadrant of left female breast Left breast invasive ductal carcinoma grade 3 ER 0% PR 0% HER-2 negative Ki-67 75%, 2.9 cm mass with microcalcifications plus abnormal lymph nodes biopsy-proven to be breast cancer T2 N1 M0 stage IIB  clinical stage PET-CT: Neg Breast MRI: 3.1 cm left breast mass, 1.6 cm Left Axillary LN S/P Neoadjuvant Dose dense AC folll by Abraxane X 12  left breast MRI: now measures 0.9 x 0.8 x 1.9 cm (previously measured 3.0 x 3.1 x 1.8 cm). Decreased size of abnormal Left axill LN  Recommendation: 1. Follow-up with surgery Dr. Brantley Stage ( could be [facility] clinical trial candidate) I had lengthy discussions with her about the clinical trial. I discussed with her what the standard of care is if she has sentinel lymph node study that come back positive. It would be with axillary lymph node dissection. But patient does not want to do axillary lymph node dissection. She would prefer to have lumpectomy with sentinel lymph node biopsy. If the sentinel lymph node biopsy is positive she would prefer to have radiation instead of surgery. She will discuss with Dr. Brantley Stage and make a final decision.  RTC after surgery  No orders of the defined types were placed in this encounter.   The patient has a good understanding of the overall plan. she agrees with it. she will call with any problems that may develop before the next visit here.   Rulon Eisenmenger, MD 08/08/2015

## 2015-08-10 ENCOUNTER — Ambulatory Visit: Payer: Self-pay | Admitting: Surgery

## 2015-08-10 DIAGNOSIS — C50122 Malignant neoplasm of central portion of left male breast: Secondary | ICD-10-CM

## 2015-08-10 DIAGNOSIS — C50912 Malignant neoplasm of unspecified site of left female breast: Secondary | ICD-10-CM | POA: Diagnosis not present

## 2015-08-10 NOTE — H&P (Signed)
Carla Bennett 32/09/2481 50:03 AM Location: [facility] Patient #: [id number] DOB: 09/29/1935 Married / Language: English / Race: White Female  History of Present Illness Carla Moores A. Ariza Evans MD; 08/10/2015 1:07 PM) Patient words: The patient returns today to discuss surgical treatment. She has had a good response to chemotherapy. She is now done. The tumor is smaller by MRI criteria in the left axillary node has resolved.  Carla Bennett is a 80 year old with above-mentioned history of left breast cancer treated with neoadjuvant chemotherapy and is here today to discuss the breast MRI report. She is doing slightly better with improvement in fatigue but still has decreased appetite and taste. Denies any nausea vomiting.      Left breast invasive ductal carcinoma grade 3 ER 0% PR 0% HER-2 negative Ki-67 75%, 2.9 cm mass with microcalcifications plus abnormal lymph nodes biopsy-proven to be breast cancer T2 N1 M0 stage IIB clinical stage PET-CT: Neg Breast MRI: 3.1 cm left breast mass, 1.6 cm Left Axillary LN S/P Neoadjuvant Dose dense AC folll by Abraxane X 12 left breast MRI: now measures 0.9 x 0.8 x 1.9 cm (previously measured 3.0 x 3.1 x 1.8 cm). Decreased size of abnormal Left axill LN         CLINICAL DATA: Biopsy-proven left breast cancer, with metastasis to a left axillary lymph node. Diagnosis was made in April 2016 following ultrasound-guided biopsies of the left breast mass and suspicious left axillary lymph node. Status post neoadjuvant chemotherapy. Evaluate response.  LABS: None obtained  EXAM: BILATERAL BREAST MRI WITH AND WITHOUT CONTRAST  TECHNIQUE: Multiplanar, multisequence MR images of both breasts were obtained prior to and following the intravenous administration of 12 ml of MultiHance.  THREE-DIMENSIONAL MR IMAGE RENDERING ON INDEPENDENT WORKSTATION:  Three-dimensional MR images were rendered by post-processing of the original  MR data on an independent workstation. The three-dimensional MR images were interpreted, and findings are reported in the following complete MRI report for this study. Three dimensional images were evaluated at the independent DynaCad workstation  COMPARISON: Previous bilateral mammogram and left breast ultrasound from [place] in April 2016. Bilateral breast MRI dated 03/16/2015.  FINDINGS: Breast composition: b. Scattered fibroglandular tissue.  Background parenchymal enhancement: Mild  Right breast: No mass or abnormal enhancement.  Left breast: The biopsy-proven malignancy in the upper slightly outer left breast has decreased in size following neoadjuvant chemotherapy. The mass is irregular in shape and measures approximately 0.9 cm AP diameter x 0.8 cm transverse diameter x 1.9 cm craniocaudal span on today's exam. Washout kinetics are seen within the enhancing mass. Biopsy clip artifact is seen approximately 8 mm directly lateral to the mass. On the prior MRI on dated 03/16/2015, this mass was measured to be 3.0 x 3.1 x 1.8 cm in size.  No new suspicious areas of enhancement in the left breast.  Two simple cysts are seen in the upper left breast.  Lymph nodes: The previously described left axillary lymph node with focal cortical thickening, and shown to be involved with metastatic disease on prior biopsy has decreased in size. Cortical thickness is now within normal limits.  Ancillary findings: Small simple bilateral pleural effusions are seen layering dependently.  IMPRESSION: 1. Positive response to neoadjuvant chemotherapy. The biopsy-proven malignancy in the upper outer left breast now measures 0.9 x 0.8 x 1.9 cm (previously measured 3.0 x 3.1 x 1.8 cm). 2. Interval decrease in size of the previously described abnormal left axillary lymph node, which was shown to  be involved with metastatic disease on prior biopsy. 3. No MRI evidence of malignancy  in the right breast. 4. Small bilateral pleural effusions.  RECOMMENDATION: Continue treatment planning.  BI-RADS CATEGORY 6: Known biopsy-proven malignancy.   Electronically Signed By: Curlene Dolphin M.D. On: 08/06/2015 15:34.  The patient is a 80 year old female.   Allergies Elbert Ewings, CMA; 08/10/2015 11:09 AM) No Known Drug Allergies 04/17/2015  Medication History Elbert Ewings, CMA; 08/10/2015 11:10 AM) LORazepam (0.5MG Tablet, Oral as needed) Active. Furosemide (20MG Tablet, Oral daily) Active. Labetalol HCl (200MG Tablet, Oral daily) Active. B Complex Vitamins (Oral daily) Active. Lisinopril (40MG Tablet, Oral daily) Active. Claritin (10MG Capsule, Oral daily) Active. Medications Reconciled    Vitals Elbert Ewings CMA; 08/10/2015 11:10 AM) 08/10/2015 11:10 AM Weight: 131 lb Height: 61in Body Surface Area: 1.58 m Body Mass Index: 24.75 kg/m  Temp.: 97.20F(Temporal)  Pulse: 74 (Regular)  BP: 128/78 (Sitting, Left Arm, Standard)      Physical Exam (Mylinda Brook A. Larue Lightner MD; 08/10/2015 1:08 PM)  General Mental Status-Alert. General Appearance-Consistent with stated age. Hydration-Well hydrated. Voice-Normal.  Breast Note: Tumor left breast is not palpable. Left axilla normal. Right breast normal. Port-A-Cath noted right subclavian region intact.  Neurologic Neurologic evaluation reveals -alert and oriented x 3 with no impairment of recent or remote memory. Mental Status-Normal.  Lymphatic Head & Neck  General Head & Neck Lymphatics: Bilateral - Description - Normal. Axillary  General Axillary Region: Bilateral - Description - Normal. Tenderness - Non Tender.    Assessment & Plan (Shantera Monts A. Khamryn Calderone MD; 08/10/2015 1:10 PM)  BREAST CANCER, STAGE 2, LEFT (C50.912) Impression: Discussed the [facility] trial with patient today and she has no interest. Discussed the standard of care with lymph node positivity and the  role of axillary lymph node dissection. She has no interest in axillary lymph node dissection and would like to pursue sentinel lymph node mapping. She understands this is not standard of care. Discussed left breast partial mastectomy and sentinel lymph node mapping. Her lymph node was not marked preoperatively. Her exam is normal though. MRI shows resolution. Her port will be removed as well. Risk of left breast partial mastectomy include bleeding, infection, seroma, more surgery, use of seed/wire, wound care, cosmetic deformity and the need for other treatments, death , blood clots, death. Pt agrees to proceed. Risk of sentinel lymph node mapping include bleeding, infection, lymphedema, shoulder pain. stiffness, dye allergy. cosmetic deformity , blood clots, death, need for more surgery. Pt agres to proceed.  Current Plans Pt Education - [facility] Breast Cancer Information Given - [facility] "Breast Journey" Package We discussed the staging and pathophysiology of breast cancer. We discussed all of the different options for treatment for breast cancer including surgery, chemotherapy, radiation therapy, Herceptin, and antiestrogen therapy. We discussed a sentinel lymph node biopsy as she does not appear to having lymph node involvement right now. We discussed the performance of that with injection of radioactive tracer and blue dye. We discussed that she would have an incision underneath her axillary hairline. We discussed that there is a bout a 10-20% chance of having a positive node with a sentinel lymph node biopsy and we will await the permanent pathology to make any other first further decisions in terms of her treatment. One of these options might be to return to the operating room to perform an axillary lymph node dissection. We discussed about a 1-2% risk lifetime of chronic shoulder pain as well as lymphedema associated with a sentinel lymph  node biopsy. We discussed the options for treatment of the breast  cancer which included lumpectomy versus a mastectomy. We discussed the performance of the lumpectomy with a wire placement. We discussed a 10-20% chance of a positive margin requiring reexcision in the operating room. We also discussed that she may need radiation therapy or antiestrogen therapy or both if she undergoes lumpectomy. We discussed the mastectomy and the postoperative care for that as well. We discussed that there is no difference in her survival whether she undergoes lumpectomy with radiation therapy or antiestrogen therapy versus a mastectomy. There is a slight difference in the local recurrence rate being 3-5% with lumpectomy and about 1% with a mastectomy. We discussed the risks of operation including bleeding, infection, possible reoperation. She understands her further therapy will be based on what her stages at the time of her operation.  Pt Education - [facility] breast cancer surgery: discussed with patient and provided information. Pt Education - [facility] Breast Biopsy HCI: discussed with patient and provided information. Pt Education - [facility] (After Breast Cancer) Class Info: discussed with patient and provided information.

## 2015-08-16 ENCOUNTER — Encounter (HOSPITAL_BASED_OUTPATIENT_CLINIC_OR_DEPARTMENT_OTHER): Payer: Self-pay | Admitting: *Deleted

## 2015-08-17 ENCOUNTER — Encounter: Payer: Self-pay | Admitting: *Deleted

## 2015-08-17 ENCOUNTER — Telehealth: Payer: Self-pay | Admitting: Hematology and Oncology

## 2015-08-17 NOTE — Telephone Encounter (Signed)
lvm for pt regarding to NOV appt.. °

## 2015-08-21 DIAGNOSIS — N183 Chronic kidney disease, stage 3 (moderate): Secondary | ICD-10-CM | POA: Diagnosis not present

## 2015-08-21 DIAGNOSIS — I129 Hypertensive chronic kidney disease with stage 1 through stage 4 chronic kidney disease, or unspecified chronic kidney disease: Secondary | ICD-10-CM | POA: Diagnosis not present

## 2015-08-21 DIAGNOSIS — C50412 Malignant neoplasm of upper-outer quadrant of left female breast: Secondary | ICD-10-CM | POA: Diagnosis not present

## 2015-08-21 DIAGNOSIS — I5032 Chronic diastolic (congestive) heart failure: Secondary | ICD-10-CM | POA: Diagnosis not present

## 2015-08-21 DIAGNOSIS — E210 Primary hyperparathyroidism: Secondary | ICD-10-CM | POA: Diagnosis not present

## 2015-08-21 HISTORY — PX: BREAST LUMPECTOMY: SHX2

## 2015-08-23 ENCOUNTER — Encounter (HOSPITAL_BASED_OUTPATIENT_CLINIC_OR_DEPARTMENT_OTHER): Payer: Self-pay | Admitting: Anesthesiology

## 2015-08-23 ENCOUNTER — Ambulatory Visit (HOSPITAL_COMMUNITY)
Admission: RE | Admit: 2015-08-23 | Discharge: 2015-08-23 | Disposition: A | Discharge status: Home / Self Care | Payer: Medicare Other | Source: Ambulatory Visit | Attending: Surgery | Admitting: Surgery

## 2015-08-23 ENCOUNTER — Ambulatory Visit (HOSPITAL_BASED_OUTPATIENT_CLINIC_OR_DEPARTMENT_OTHER): Payer: Medicare Other | Admitting: Anesthesiology

## 2015-08-23 ENCOUNTER — Encounter (HOSPITAL_BASED_OUTPATIENT_CLINIC_OR_DEPARTMENT_OTHER)
Admission: RE | Disposition: A | Discharge status: Home / Self Care | Payer: Self-pay | Source: Ambulatory Visit | Attending: Surgery

## 2015-08-23 ENCOUNTER — Ambulatory Visit (HOSPITAL_BASED_OUTPATIENT_CLINIC_OR_DEPARTMENT_OTHER)
Admission: RE | Admit: 2015-08-23 | Discharge: 2015-08-23 | Disposition: A | Discharge status: Home / Self Care | Payer: Medicare Other | Source: Ambulatory Visit | Attending: Surgery | Admitting: Surgery

## 2015-08-23 DIAGNOSIS — Z452 Encounter for adjustment and management of vascular access device: Secondary | ICD-10-CM | POA: Diagnosis not present

## 2015-08-23 DIAGNOSIS — D0512 Intraductal carcinoma in situ of left breast: Secondary | ICD-10-CM | POA: Insufficient documentation

## 2015-08-23 DIAGNOSIS — C50122 Malignant neoplasm of central portion of left male breast: Secondary | ICD-10-CM

## 2015-08-23 DIAGNOSIS — I10 Essential (primary) hypertension: Secondary | ICD-10-CM | POA: Diagnosis not present

## 2015-08-23 DIAGNOSIS — C50412 Malignant neoplasm of upper-outer quadrant of left female breast: Secondary | ICD-10-CM | POA: Diagnosis not present

## 2015-08-23 DIAGNOSIS — G8918 Other acute postprocedural pain: Secondary | ICD-10-CM | POA: Diagnosis not present

## 2015-08-23 DIAGNOSIS — C773 Secondary and unspecified malignant neoplasm of axilla and upper limb lymph nodes: Secondary | ICD-10-CM | POA: Insufficient documentation

## 2015-08-23 DIAGNOSIS — Z171 Estrogen receptor negative status [ER-]: Secondary | ICD-10-CM | POA: Diagnosis not present

## 2015-08-23 DIAGNOSIS — R079 Chest pain, unspecified: Secondary | ICD-10-CM | POA: Diagnosis not present

## 2015-08-23 DIAGNOSIS — Z9221 Personal history of antineoplastic chemotherapy: Secondary | ICD-10-CM | POA: Insufficient documentation

## 2015-08-23 DIAGNOSIS — J90 Pleural effusion, not elsewhere classified: Secondary | ICD-10-CM | POA: Diagnosis not present

## 2015-08-23 DIAGNOSIS — C50912 Malignant neoplasm of unspecified site of left female breast: Secondary | ICD-10-CM | POA: Diagnosis not present

## 2015-08-23 HISTORY — PX: RADIOACTIVE SEED GUIDED PARTIAL MASTECTOMY WITH AXILLARY SENTINEL LYMPH NODE BIOPSY: SHX6520

## 2015-08-23 HISTORY — PX: PORT-A-CATH REMOVAL: SHX5289

## 2015-08-23 SURGERY — RADIOACTIVE SEED GUIDED PARTIAL MASTECTOMY WITH AXILLARY SENTINEL LYMPH NODE BIOPSY
Anesthesia: Regional | Site: Chest | Laterality: Right

## 2015-08-23 MED ORDER — OXYCODONE HCL 5 MG PO TABS
5.0000 mg | ORAL_TABLET | Freq: Once | ORAL | Status: AC
  Administered 2015-08-23: 5 mg via ORAL

## 2015-08-23 MED ORDER — FENTANYL CITRATE (PF) 100 MCG/2ML IJ SOLN
INTRAMUSCULAR | Status: AC
  Filled 2015-08-23: qty 2

## 2015-08-23 MED ORDER — EPHEDRINE SULFATE 50 MG/ML IJ SOLN
INTRAMUSCULAR | Status: DC | PRN
  Administered 2015-08-23 (×2): 10 mg via INTRAVENOUS

## 2015-08-23 MED ORDER — OXYCODONE-ACETAMINOPHEN 5-325 MG PO TABS: 1.0000 | ORAL_TABLET | ORAL | Status: DC | PRN

## 2015-08-23 MED ORDER — PHENYLEPHRINE 40 MCG/ML (10ML) SYRINGE FOR IV PUSH (FOR BLOOD PRESSURE SUPPORT)
PREFILLED_SYRINGE | INTRAVENOUS | Status: AC
  Filled 2015-08-23: qty 10

## 2015-08-23 MED ORDER — ONDANSETRON HCL 4 MG/2ML IJ SOLN
INTRAMUSCULAR | Status: DC | PRN
  Administered 2015-08-23: 4 mg via INTRAVENOUS

## 2015-08-23 MED ORDER — CHLORHEXIDINE GLUCONATE 4 % EX LIQD: 1.0000 "application " | Freq: Once | CUTANEOUS | Status: DC

## 2015-08-23 MED ORDER — MEPERIDINE HCL 25 MG/ML IJ SOLN: 6.2500 mg | INTRAMUSCULAR | Status: DC | PRN

## 2015-08-23 MED ORDER — LIDOCAINE HCL (CARDIAC) 20 MG/ML IV SOLN
INTRAVENOUS | Status: AC
  Filled 2015-08-23: qty 5

## 2015-08-23 MED ORDER — EPHEDRINE SULFATE 50 MG/ML IJ SOLN
INTRAMUSCULAR | Status: AC
  Filled 2015-08-23: qty 1

## 2015-08-23 MED ORDER — DEXAMETHASONE SODIUM PHOSPHATE 10 MG/ML IJ SOLN
INTRAMUSCULAR | Status: AC
  Filled 2015-08-23: qty 1

## 2015-08-23 MED ORDER — SCOPOLAMINE 1 MG/3DAYS TD PT72: 1.0000 | MEDICATED_PATCH | Freq: Once | TRANSDERMAL | Status: DC | PRN

## 2015-08-23 MED ORDER — TECHNETIUM TC 99M SULFUR COLLOID FILTERED
1.0000 | Freq: Once | INTRAVENOUS | Status: AC | PRN
  Administered 2015-08-23: 1 via INTRADERMAL

## 2015-08-23 MED ORDER — ONDANSETRON HCL 4 MG/2ML IJ SOLN
INTRAMUSCULAR | Status: AC
  Filled 2015-08-23: qty 2

## 2015-08-23 MED ORDER — MIDAZOLAM HCL 2 MG/2ML IJ SOLN
INTRAMUSCULAR | Status: AC
  Filled 2015-08-23: qty 2

## 2015-08-23 MED ORDER — GLYCOPYRROLATE 0.2 MG/ML IJ SOLN: 0.2000 mg | Freq: Once | INTRAMUSCULAR | Status: DC | PRN

## 2015-08-23 MED ORDER — PROPOFOL 10 MG/ML IV BOLUS
INTRAVENOUS | Status: DC | PRN
  Administered 2015-08-23: 130 mg via INTRAVENOUS

## 2015-08-23 MED ORDER — MIDAZOLAM HCL 2 MG/2ML IJ SOLN
1.0000 mg | INTRAMUSCULAR | Status: DC | PRN
  Administered 2015-08-23 (×2): 1 mg via INTRAVENOUS

## 2015-08-23 MED ORDER — DEXTROSE 5 % IV SOLN
3.0000 g | INTRAVENOUS | Status: AC
  Administered 2015-08-23: 2 g via INTRAVENOUS

## 2015-08-23 MED ORDER — FENTANYL CITRATE (PF) 100 MCG/2ML IJ SOLN
25.0000 ug | INTRAMUSCULAR | Status: DC | PRN
  Administered 2015-08-23 (×2): 50 ug via INTRAVENOUS

## 2015-08-23 MED ORDER — CEFAZOLIN SODIUM-DEXTROSE 2-3 GM-% IV SOLR
INTRAVENOUS | Status: AC
  Filled 2015-08-23: qty 50

## 2015-08-23 MED ORDER — OXYCODONE HCL 5 MG PO TABS
ORAL_TABLET | ORAL | Status: AC
  Filled 2015-08-23: qty 1

## 2015-08-23 MED ORDER — SODIUM CHLORIDE 0.9 % IJ SOLN
INTRAMUSCULAR | Status: DC | PRN
  Administered 2015-08-23: 5 mL

## 2015-08-23 MED ORDER — FENTANYL CITRATE (PF) 100 MCG/2ML IJ SOLN
50.0000 ug | INTRAMUSCULAR | Status: DC | PRN
  Administered 2015-08-23: 100 ug via INTRAVENOUS

## 2015-08-23 MED ORDER — LACTATED RINGERS IV SOLN
INTRAVENOUS | Status: DC
  Administered 2015-08-23 (×2): via INTRAVENOUS

## 2015-08-23 MED ORDER — PHENYLEPHRINE HCL 10 MG/ML IJ SOLN
INTRAMUSCULAR | Status: DC | PRN
  Administered 2015-08-23: 80 ug via INTRAVENOUS

## 2015-08-23 MED ORDER — LIDOCAINE HCL (CARDIAC) 20 MG/ML IV SOLN
INTRAVENOUS | Status: DC | PRN
  Administered 2015-08-23: 50 mg via INTRAVENOUS

## 2015-08-23 MED ORDER — SUCCINYLCHOLINE CHLORIDE 20 MG/ML IJ SOLN
INTRAMUSCULAR | Status: DC | PRN
  Administered 2015-08-23: 100 mg via INTRAVENOUS

## 2015-08-23 MED ORDER — DEXAMETHASONE SODIUM PHOSPHATE 4 MG/ML IJ SOLN
INTRAMUSCULAR | Status: DC | PRN
  Administered 2015-08-23: 10 mg via INTRAVENOUS

## 2015-08-23 MED ORDER — BUPIVACAINE-EPINEPHRINE (PF) 0.25% -1:200000 IJ SOLN
INTRAMUSCULAR | Status: DC | PRN
  Administered 2015-08-23: 21 mL

## 2015-08-23 SURGICAL SUPPLY — 60 items
APL SKNCLS STERI-STRIP NONHPOA (GAUZE/BANDAGES/DRESSINGS)
APPLIER CLIP 9.375 MED OPEN (MISCELLANEOUS) ×4
APR CLP MED 9.3 20 MLT OPN (MISCELLANEOUS) ×2
BENZOIN TINCTURE PRP APPL 2/3 (GAUZE/BANDAGES/DRESSINGS) IMPLANT
BINDER BREAST LRG (GAUZE/BANDAGES/DRESSINGS) IMPLANT
BINDER BREAST MEDIUM (GAUZE/BANDAGES/DRESSINGS) ×2 IMPLANT
BINDER BREAST XLRG (GAUZE/BANDAGES/DRESSINGS) IMPLANT
BINDER BREAST XXLRG (GAUZE/BANDAGES/DRESSINGS) IMPLANT
BLADE SURG 15 STRL LF DISP TIS (BLADE) ×2 IMPLANT
BLADE SURG 15 STRL SS (BLADE) ×4
CANISTER SUCT 1200ML W/VALVE (MISCELLANEOUS) ×4 IMPLANT
CHLORAPREP W/TINT 26ML (MISCELLANEOUS) ×4 IMPLANT
CLIP APPLIE 9.375 MED OPEN (MISCELLANEOUS) ×2 IMPLANT
CLOSURE WOUND 1/2 X4 (GAUZE/BANDAGES/DRESSINGS)
COVER BACK TABLE 60X90IN (DRAPES) ×4 IMPLANT
COVER MAYO STAND STRL (DRAPES) ×4 IMPLANT
COVER PROBE W GEL 5X96 (DRAPES) ×4 IMPLANT
DECANTER SPIKE VIAL GLASS SM (MISCELLANEOUS) ×4 IMPLANT
DEVICE DUBIN W/COMP PLATE 8390 (MISCELLANEOUS) ×4 IMPLANT
DRAPE LAPAROSCOPIC ABDOMINAL (DRAPES) ×4 IMPLANT
DRAPE LAPAROTOMY 100X72 PEDS (DRAPES) ×2 IMPLANT
DRAPE UTILITY XL STRL (DRAPES) ×4 IMPLANT
ELECT COATED BLADE 2.86 ST (ELECTRODE) ×4 IMPLANT
ELECT REM PT RETURN 9FT ADLT (ELECTROSURGICAL) ×4
ELECTRODE REM PT RTRN 9FT ADLT (ELECTROSURGICAL) ×2 IMPLANT
GLOVE BIO SURGEON STRL SZ 6.5 (GLOVE) ×1 IMPLANT
GLOVE BIO SURGEON STRL SZ7 (GLOVE) ×2 IMPLANT
GLOVE BIO SURGEONS STRL SZ 6.5 (GLOVE) ×1
GLOVE BIOGEL PI IND STRL 7.0 (GLOVE) IMPLANT
GLOVE BIOGEL PI IND STRL 7.5 (GLOVE) IMPLANT
GLOVE BIOGEL PI IND STRL 8 (GLOVE) ×2 IMPLANT
GLOVE BIOGEL PI INDICATOR 7.0 (GLOVE) ×2
GLOVE BIOGEL PI INDICATOR 7.5 (GLOVE) ×2
GLOVE BIOGEL PI INDICATOR 8 (GLOVE) ×2
GLOVE ECLIPSE 8.0 STRL XLNG CF (GLOVE) ×4 IMPLANT
GLOVE EXAM NITRILE LRG STRL (GLOVE) ×2 IMPLANT
GOWN STRL REUS W/ TWL LRG LVL3 (GOWN DISPOSABLE) ×4 IMPLANT
GOWN STRL REUS W/TWL LRG LVL3 (GOWN DISPOSABLE) ×12
HEMOSTAT SNOW SURGICEL 2X4 (HEMOSTASIS) IMPLANT
KIT MARKER MARGIN INK (KITS) ×4 IMPLANT
LIQUID BAND (GAUZE/BANDAGES/DRESSINGS) ×4 IMPLANT
NDL HYPO 25X1 1.5 SAFETY (NEEDLE) ×2 IMPLANT
NDL SAFETY ECLIPSE 18X1.5 (NEEDLE) IMPLANT
NEEDLE HYPO 18GX1.5 SHARP (NEEDLE) ×4
NEEDLE HYPO 25X1 1.5 SAFETY (NEEDLE) ×8 IMPLANT
NS IRRIG 1000ML POUR BTL (IV SOLUTION) ×4 IMPLANT
PACK BASIN DAY SURGERY FS (CUSTOM PROCEDURE TRAY) ×4 IMPLANT
PENCIL BUTTON HOLSTER BLD 10FT (ELECTRODE) ×4 IMPLANT
SLEEVE SCD COMPRESS KNEE MED (MISCELLANEOUS) ×4 IMPLANT
SPONGE LAP 4X18 X RAY DECT (DISPOSABLE) ×6 IMPLANT
STRIP CLOSURE SKIN 1/2X4 (GAUZE/BANDAGES/DRESSINGS) IMPLANT
SUT MNCRL AB 4-0 PS2 18 (SUTURE) ×4 IMPLANT
SUT MON AB 4-0 PC3 18 (SUTURE) ×4 IMPLANT
SUT VICRYL 3-0 CR8 SH (SUTURE) ×6 IMPLANT
SYR CONTROL 10ML LL (SYRINGE) ×6 IMPLANT
TOWEL OR 17X24 6PK STRL BLUE (TOWEL DISPOSABLE) ×4 IMPLANT
TOWEL OR NON WOVEN STRL DISP B (DISPOSABLE) ×4 IMPLANT
TUBE CONNECTING 20'X1/4 (TUBING) ×1
TUBE CONNECTING 20X1/4 (TUBING) ×3 IMPLANT
YANKAUER SUCT BULB TIP NO VENT (SUCTIONS) ×4 IMPLANT

## 2015-08-23 NOTE — Interval H&P Note (Signed)
History and Physical Interval Note:  08/23/2015 1:22 PM  [name]  has presented today for surgery, with the diagnosis of Left Breast Cancer  The various methods of treatment have been discussed with the patient and family. After consideration of risks, benefits and other options for treatment, the patient has consented to  Procedure(s): LEFT BREAST PARTIAL MASTECTOMY WITH SENTINEL LYMPH NODE MAPPING (Left) REMOVAL PORT-A-CATH (N/A) as a surgical intervention .  The patient's history has been reviewed, patient examined, no change in status, stable for surgery.  I have reviewed the patient's chart and labs.  Questions were answered to the patient's satisfaction.     Will remove port today as well since she will no longer need it.The procedure has been discussed with the patient.  Alternative therapies have been discussed with the patient.  Operative risks include bleeding,  Infection,  Organ injury,  Nerve injury,  Blood vessel injury,  DVT,  Pulmonary embolism,  Death,  And possible reoperation.  Medical management risks include worsening of present situation.  The success of the procedure is 50 -90 % at treating patients symptoms.  The patient understands and agrees to proceed.   Carla Bennett A.

## 2015-08-23 NOTE — Op Note (Signed)
Preoperative diagnosis: Left breast cancer stage II  Postoperative diagnosis: Same  Procedure: Left breast seed localized partial mastectomy with left axillary sentinel lymph node mapping using methylene blue dye and port removal  Surgeon: Erroll Luna M.D.  Anesthesia: Gen. endotracheal anesthesia with 0.25% Sensorcaine local with epinephrine and pectoral block placed by anesthesia  EBL: Minimal  Specimens: Left breast mass with clip and seed sent to pathology with 2 left axillary sentinel nodes to pathology. Port-A-Cath was discarded and was intact  Drains: None  Indications for procedure: Patient is a 80 year old female with triple negative stage II left breast cancer. She underwent neoadjuvant chemotherapy and opted for breast conservation after reviewing all of her surgical options. She was done with her chemotherapy and her Port-A-Cath was to be removed as well.The procedure has been discussed with the patient. Alternatives to surgery have been discussed with the patient.  Risks of surgery include bleeding,  Infection,  Seroma formation, death,  and the need for further surgery.   The patient understands and wishes to proceed.Sentinel lymph node mapping and dissection has been discussed with the patient.  Risk of bleeding,  Infection,  Seroma formation,  Additional procedures,,  Shoulder weakness ,  Shoulder stiffness,  Nerve and blood vessel injury and reaction to the mapping dyes have been discussed.  Alternatives to surgery have been discussed with the patient.  The patient agrees to proceed.  Description of procedure: The patient was met in the holding area and questions are answered. The neoprobe was used to verify proper received location. She underwent injection of the left breast with technetium sulfur colloid for mapping purposes. Questions were answered. She was taken back to the operating room and placed supine on the OR table. After induction of general endotracheal anesthesia,  the upper chest region was prepped and draped in a sterile fashion bilaterally. Timeout was done and 4 mL of methylene blue dye were injected under left nipple. Port-A-Cath was removed first. Incision was made over the Port-A-Cath which is located in her right upper chest region. Catheter was dissected out removed after cutting the sutures holding it in place. The tract with a catheter was tunneled through was closed with 3-0 Vicryl. The catheter was intact. Wound closed with 3-0 Vicryl and 4-0 Monocryl.  The partial mastectomy was done left next. Neoprobe was used and the cecum was localized. Curvilinear incision was made in the left lateral breast. Dissection was carried around both this evening clip in the area was removed. Radiograph revealed the seating clip to be in the specimen with adequate gross negative margin. Cavity was irrigated and made hemostatic with cautery. Clips are placed for postoperative radiation therapy. The cavity was closed with a deep layer of 3-0 Vicryl for Monocryl subcuticular stitch.  The neoprobe settings were changed technetium. Hotspot identified left axilla. A 3 cm transverse incision was made in the left axilla and dissection was carried down into level I lymph node basin. 2 hot non-blue sentinel nodes were identified and removed. Background counts approached 0. Hemostasis was achieved. Wound closed the deep layer through Vicryl for Monocryl subcuticular stitch. Liquid adhesive applied to all 3 incisions. All final counts are found to be correct. Breast binder placed. Patient awoke and  extubated taken to  recovery in satisfactory condition.

## 2015-08-23 NOTE — Discharge Instructions (Signed)
[facility] °Office Phone Number [phone] ° °BREAST BIOPSY/ PARTIAL MASTECTOMY: POST OP INSTRUCTIONS ° °Always review your discharge instruction sheet given to you by the facility where your surgery was performed. ° °IF YOU HAVE DISABILITY OR FAMILY LEAVE FORMS, YOU MUST BRING THEM TO THE OFFICE FOR PROCESSING.  DO NOT GIVE THEM TO YOUR DOCTOR. ° °1. A prescription for pain medication may be given to you upon discharge.  Take your pain medication as prescribed, if needed.  If narcotic pain medicine is not needed, then you may take acetaminophen (Tylenol) or ibuprofen (Advil) as needed. °2. Take your usually prescribed medications unless otherwise directed °3. If you need a refill on your pain medication, please contact your pharmacy.  They will contact our office to request authorization.  Prescriptions will not be filled after 5pm or on week-ends. °4. You should eat very light the first 24 hours after surgery, such as soup, crackers, pudding, etc.  Resume your normal diet the day after surgery. °5. Most patients will experience some swelling and bruising in the breast.  Ice packs and a good support bra will help.  Swelling and bruising can take several days to resolve.  °6. It is common to experience some constipation if taking pain medication after surgery.  Increasing fluid intake and taking a stool softener will usually help or prevent this problem from occurring.  A mild laxative (Milk of Magnesia or Miralax) should be taken according to package directions if there are no bowel movements after 48 hours. °7. Unless discharge instructions indicate otherwise, you may remove your bandages 24-48 hours after surgery, and you may shower at that time.  You may have steri-strips (small skin tapes) in place directly over the incision.  These strips should be left on the skin for 7-10 days.  If your surgeon used skin glue on the incision, you may shower in 24 hours.  The glue will flake off over the  next 2-3 weeks.  Any sutures or staples will be removed at the office during your follow-up visit. °8. ACTIVITIES:  You may resume regular daily activities (gradually increasing) beginning the next day.  Wearing a good support bra or sports bra minimizes pain and swelling.  You may have sexual intercourse when it is comfortable. °a. You may drive when you no longer are taking prescription pain medication, you can comfortably wear a seatbelt, and you can safely maneuver your car and apply brakes. °b. RETURN TO WORK:  ______________________________________________________________________________________ °9. You should see your doctor in the office for a follow-up appointment approximately two weeks after your surgery.  Your doctor’s nurse will typically make your follow-up appointment when she calls you with your pathology report.  Expect your pathology report 2-3 business days after your surgery.  You may call to check if you do not hear from us after three days. °10. OTHER INSTRUCTIONS: _______________________________________________________________________________________________ _____________________________________________________________________________________________________________________________________ °_____________________________________________________________________________________________________________________________________ °_____________________________________________________________________________________________________________________________________ ° °WHEN TO CALL YOUR DOCTOR: °1. Fever over 101.0 °2. Nausea and/or vomiting. °3. Extreme swelling or bruising. °4. Continued bleeding from incision. °5. Increased pain, redness, or drainage from the incision. ° °The clinic staff is available to answer your questions during regular business hours.  Please don’t hesitate to call and ask to speak to one of the nurses for clinical concerns.  If you have a medical emergency, go to the nearest  emergency room or call 911.  A surgeon from [facility] is always on call at the hospital. ° °For further questions, please visit [email or web address]  ° ° °  Post Anesthesia Home Care Instructions ° °Activity: °Get plenty of rest for the remainder of the day. A responsible adult should stay with you for 24 hours following the procedure.  °For the next 24 hours, DO NOT: °-Drive a car °-Operate machinery °-Drink alcoholic beverages °-Take any medication unless instructed by your physician °-Make any legal decisions or sign important papers. ° °Meals: °Start with liquid foods such as gelatin or soup. Progress to regular foods as tolerated. Avoid greasy, spicy, heavy foods. If nausea and/or vomiting occur, drink only clear liquids until the nausea and/or vomiting subsides. Call your physician if vomiting continues. ° °Special Instructions/Symptoms: °Your throat may feel dry or sore from the anesthesia or the breathing tube placed in your throat during surgery. If this causes discomfort, gargle with warm salt water. The discomfort should disappear within 24 hours. ° °If you had a scopolamine patch placed behind your ear for the management of post- operative nausea and/or vomiting: ° °1. The medication in the patch is effective for 72 hours, after which it should be removed.  Wrap patch in a tissue and discard in the trash. Wash hands thoroughly with soap and water. °2. You may remove the patch earlier than 72 hours if you experience unpleasant side effects which may include dry mouth, dizziness or visual disturbances. °3. Avoid touching the patch. Wash your hands with soap and water after contact with the patch. °  ° °Regional Anesthesia Blocks ° °1. Numbness or the inability to move the "blocked" extremity may last from 3-48 hours after placement. The length of time depends on the medication injected and your individual response to the medication. If the numbness is not going away after 48 hours, call  your surgeon. ° °2. The extremity that is blocked will need to be protected until the numbness is gone and the  Strength has returned. Because you cannot feel it, you will need to take extra care to avoid injury. Because it may be weak, you may have difficulty moving it or using it. You may not know what position it is in without looking at it while the block is in effect. ° °3. For blocks in the legs and feet, returning to weight bearing and walking needs to be done carefully. You will need to wait until the numbness is entirely gone and the strength has returned. You should be able to move your leg and foot normally before you try and bear weight or walk. You will need someone to be with you when you first try to ensure you do not fall and possibly risk injury. ° °4. Bruising and tenderness at the needle site are common side effects and will resolve in a few days. ° °5. Persistent numbness or new problems with movement should be communicated to the surgeon or the  Surgery Center ([phone])/ [facility] ([phone]). ° °

## 2015-08-23 NOTE — H&P (View-Only) (Signed)
Kelvin L. Fertig 32/09/2481 50:03 AM Location: [facility] Patient #: [id number] DOB: Jul 06, 1935 Married / Language: English / Race: White Female  History of Present Illness [name] A. Elara Cocke MD; 08/10/2015 1:07 PM) Patient words: The patient returns today to discuss surgical treatment. She has had a good response to chemotherapy. She is now done. The tumor is smaller by MRI criteria in the left axillary node has resolved.  Avice Funchess Furber is a 80 year old with above-mentioned history of left breast cancer treated with neoadjuvant chemotherapy and is here today to discuss the breast MRI report. She is doing slightly better with improvement in fatigue but still has decreased appetite and taste. Denies any nausea vomiting.      Left breast invasive ductal carcinoma grade 3 ER 0% PR 0% HER-2 negative Ki-67 75%, 2.9 cm mass with microcalcifications plus abnormal lymph nodes biopsy-proven to be breast cancer T2 N1 M0 stage IIB clinical stage PET-CT: Neg Breast MRI: 3.1 cm left breast mass, 1.6 cm Left Axillary LN S/P Neoadjuvant Dose dense AC folll by Abraxane X 12 left breast MRI: now measures 0.9 x 0.8 x 1.9 cm (previously measured 3.0 x 3.1 x 1.8 cm). Decreased size of abnormal Left axill LN         CLINICAL DATA: Biopsy-proven left breast cancer, with metastasis to a left axillary lymph node. Diagnosis was made in April 2016 following ultrasound-guided biopsies of the left breast mass and suspicious left axillary lymph node. Status post neoadjuvant chemotherapy. Evaluate response.  LABS: None obtained  EXAM: BILATERAL BREAST MRI WITH AND WITHOUT CONTRAST  TECHNIQUE: Multiplanar, multisequence MR images of both breasts were obtained prior to and following the intravenous administration of 12 ml of MultiHance.  THREE-DIMENSIONAL MR IMAGE RENDERING ON INDEPENDENT WORKSTATION:  Three-dimensional MR images were rendered by post-processing of the original  MR data on an independent workstation. The three-dimensional MR images were interpreted, and findings are reported in the following complete MRI report for this study. Three dimensional images were evaluated at the independent DynaCad workstation  COMPARISON: Previous bilateral mammogram and left breast ultrasound from [facility] in April 2016. Bilateral breast MRI dated 03/16/2015.  FINDINGS: Breast composition: b. Scattered fibroglandular tissue.  Background parenchymal enhancement: Mild  Right breast: No mass or abnormal enhancement.  Left breast: The biopsy-proven malignancy in the upper slightly outer left breast has decreased in size following neoadjuvant chemotherapy. The mass is irregular in shape and measures approximately 0.9 cm AP diameter x 0.8 cm transverse diameter x 1.9 cm craniocaudal span on today's exam. Washout kinetics are seen within the enhancing mass. Biopsy clip artifact is seen approximately 8 mm directly lateral to the mass. On the prior MRI on dated 03/16/2015, this mass was measured to be 3.0 x 3.1 x 1.8 cm in size.  No new suspicious areas of enhancement in the left breast.  Two simple cysts are seen in the upper left breast.  Lymph nodes: The previously described left axillary lymph node with focal cortical thickening, and shown to be involved with metastatic disease on prior biopsy has decreased in size. Cortical thickness is now within normal limits.  Ancillary findings: Small simple bilateral pleural effusions are seen layering dependently.  IMPRESSION: 1. Positive response to neoadjuvant chemotherapy. The biopsy-proven malignancy in the upper outer left breast now measures 0.9 x 0.8 x 1.9 cm (previously measured 3.0 x 3.1 x 1.8 cm). 2. Interval decrease in size of the previously described abnormal left axillary lymph node, which was shown to  be involved with metastatic disease on prior biopsy. 3. No MRI evidence of malignancy  in the right breast. 4. Small bilateral pleural effusions.  RECOMMENDATION: Continue treatment planning.  BI-RADS CATEGORY 6: Known biopsy-proven malignancy.   Electronically Signed By: Curlene Dolphin M.D. On: 08/06/2015 15:34.  The patient is a 80 year old female.   Allergies Elbert Ewings, CMA; 08/10/2015 11:09 AM) No Known Drug Allergies 04/17/2015  Medication History Elbert Ewings, CMA; 08/10/2015 11:10 AM) LORazepam (0.5MG Tablet, Oral as needed) Active. Furosemide (20MG Tablet, Oral daily) Active. Labetalol HCl (200MG Tablet, Oral daily) Active. B Complex Vitamins (Oral daily) Active. Lisinopril (40MG Tablet, Oral daily) Active. Claritin (10MG Capsule, Oral daily) Active. Medications Reconciled    Vitals Elbert Ewings CMA; 08/10/2015 11:10 AM) 08/10/2015 11:10 AM Weight: 131 lb Height: 61in Body Surface Area: 1.58 m Body Mass Index: 24.75 kg/m  Temp.: 97.20F(Temporal)  Pulse: 74 (Regular)  BP: 128/78 (Sitting, Left Arm, Standard)      Physical Exam (Arne Schlender A. Jayven Naill MD; 08/10/2015 1:08 PM)  General Mental Status-Alert. General Appearance-Consistent with stated age. Hydration-Well hydrated. Voice-Normal.  Breast Note: Tumor left breast is not palpable. Left axilla normal. Right breast normal. Port-A-Cath noted right subclavian region intact.  Neurologic Neurologic evaluation reveals -alert and oriented x 3 with no impairment of recent or remote memory. Mental Status-Normal.  Lymphatic Head & Neck  General Head & Neck Lymphatics: Bilateral - Description - Normal. Axillary  General Axillary Region: Bilateral - Description - Normal. Tenderness - Non Tender.    Assessment & Plan (Hiro Vipond A. Daqwan Dougal MD; 08/10/2015 1:10 PM)  BREAST CANCER, STAGE 2, LEFT (C50.912) Impression: Discussed the Alliance trial with patient today and she has no interest. Discussed the standard of care with lymph node positivity and the  role of axillary lymph node dissection. She has no interest in axillary lymph node dissection and would like to pursue sentinel lymph node mapping. She understands this is not standard of care. Discussed left breast partial mastectomy and sentinel lymph node mapping. Her lymph node was not marked preoperatively. Her exam is normal though. MRI shows resolution. Her port will be removed as well. Risk of left breast partial mastectomy include bleeding, infection, seroma, more surgery, use of seed/wire, wound care, cosmetic deformity and the need for other treatments, death , blood clots, death. Pt agrees to proceed. Risk of sentinel lymph node mapping include bleeding, infection, lymphedema, shoulder pain. stiffness, dye allergy. cosmetic deformity , blood clots, death, need for more surgery. Pt agres to proceed.  Current Plans Pt Education - [facility] Breast Cancer Information Given - [employer] "Breast Journey" Package We discussed the staging and pathophysiology of breast cancer. We discussed all of the different options for treatment for breast cancer including surgery, chemotherapy, radiation therapy, Herceptin, and antiestrogen therapy. We discussed a sentinel lymph node biopsy as she does not appear to having lymph node involvement right now. We discussed the performance of that with injection of radioactive tracer and blue dye. We discussed that she would have an incision underneath her axillary hairline. We discussed that there is a bout a 10-20% chance of having a positive node with a sentinel lymph node biopsy and we will await the permanent pathology to make any other first further decisions in terms of her treatment. One of these options might be to return to the operating room to perform an axillary lymph node dissection. We discussed about a 1-2% risk lifetime of chronic shoulder pain as well as lymphedema associated with a sentinel lymph  node biopsy. We discussed the options for treatment of the breast  cancer which included lumpectomy versus a mastectomy. We discussed the performance of the lumpectomy with a wire placement. We discussed a 10-20% chance of a positive margin requiring reexcision in the operating room. We also discussed that she may need radiation therapy or antiestrogen therapy or both if she undergoes lumpectomy. We discussed the mastectomy and the postoperative care for that as well. We discussed that there is no difference in her survival whether she undergoes lumpectomy with radiation therapy or antiestrogen therapy versus a mastectomy. There is a slight difference in the local recurrence rate being 3-5% with lumpectomy and about 1% with a mastectomy. We discussed the risks of operation including bleeding, infection, possible reoperation. She understands her further therapy will be based on what her stages at the time of her operation.  Pt Education - [facility] breast cancer surgery: discussed with patient and provided information. Pt Education - [facility] Breast Biopsy HCI: discussed with patient and provided information. Pt Education - [facility] (After Breast Cancer) Class Info: discussed with patient and provided information.

## 2015-08-23 NOTE — Progress Notes (Signed)
Emotional support during breast injections °

## 2015-08-23 NOTE — Anesthesia Procedure Notes (Addendum)
Procedure Name: Intubation Date/Time: 08/23/2015 2:10 PM Performed by: Lieutenant Diego Pre-anesthesia Checklist: Patient identified, Emergency Drugs available, Suction available and Patient being monitored Patient Re-evaluated:Patient Re-evaluated prior to inductionOxygen Delivery Method: Circle System Utilized Preoxygenation: Pre-oxygenation with 100% oxygen Intubation Type: IV induction Ventilation: Mask ventilation without difficulty Laryngoscope Size: Miller and 2 Grade View: Grade I Tube type: Oral Tube size: 7.0 mm Number of attempts: 1 Airway Equipment and Method: Stylet and Oral airway Placement Confirmation: ETT inserted through vocal cords under direct vision,  positive ETCO2 and breath sounds checked- equal and bilateral Secured at: 24 cm Tube secured with: Tape Dental Injury: Teeth and Oropharynx as per pre-operative assessment    Anesthesia Regional Block:  Pectoralis block  Pre-Anesthetic Checklist: ,, timeout performed, Correct Patient, Correct Site, Correct Laterality, Correct Procedure, Correct Position, site marked, Risks and benefits discussed,  Surgical consent,  Pre-op evaluation,  At surgeon's request and post-op pain management  Laterality: Left and Upper  Prep: chloraprep       Needles:  Injection technique: Single-shot  Needle Type: Echogenic Needle     Needle Length: 9cm 9 cm Needle Gauge: 21 and 21 G    Additional Needles:  Procedures: ultrasound guided (picture in chart) Pectoralis block Narrative:  Start time: 08/23/2015 1:48 PM End time: 08/23/2015 1:52 PM Injection made incrementally with aspirations every 5 mL.  Performed by: Personally  Anesthesiologist: Shaine Mount     Left PEC block image

## 2015-08-23 NOTE — Anesthesia Preprocedure Evaluation (Signed)
Anesthesia Evaluation  Patient identified by MRN, date of birth, ID band Patient awake    Reviewed: Allergy & Precautions, NPO status , Patient's Chart, lab work & pertinent test results  Airway Mallampati: I  TM Distance: >3 FB Neck ROM: Full    Dental  (+) Teeth Intact, Dental Advisory Given   Pulmonary    breath sounds clear to auscultation       Cardiovascular hypertension, Pt. on medications and Pt. on home beta blockers  Rhythm:Regular Rate:Normal     Neuro/Psych    GI/Hepatic   Endo/Other    Renal/GU      Musculoskeletal   Abdominal   Peds  Hematology   Anesthesia Other Findings   Reproductive/Obstetrics                             Anesthesia Physical Anesthesia Plan  ASA: II  Anesthesia Plan: General and Regional   Post-op Pain Management:    Induction: Intravenous  Airway Management Planned: LMA  Additional Equipment:   Intra-op Plan:   Post-operative Plan: Extubation in OR  Informed Consent: I have reviewed the patients History and Physical, chart, labs and discussed the procedure including the risks, benefits and alternatives for the proposed anesthesia with the patient or authorized representative who has indicated his/her understanding and acceptance.   Dental advisory given  Plan Discussed with: CRNA, Anesthesiologist and Surgeon  Anesthesia Plan Comments:         Anesthesia Quick Evaluation

## 2015-08-23 NOTE — Transfer of Care (Signed)
Immediate Anesthesia Transfer of Care Note  Patient: Carla Bennett  Procedure(s) Performed: Procedure(s): LEFT BREAST PARTIAL MASTECTOMY WITH SENTINEL LYMPH NODE MAPPING (Left) REMOVAL PORT-A-CATH (N/A)  Patient Location: PACU  Anesthesia Type:General  Level of Consciousness: awake  Airway & Oxygen Therapy: Patient Spontanous Breathing and Patient connected to face mask oxygen  Post-op Assessment: Post -op Vital signs reviewed and stable  Post vital signs: Reviewed and stable  Last Vitals:  Filed Vitals:   08/23/15 1359  BP:   Pulse: 80  Temp:   Resp: 17    Complications: No apparent anesthesia complications

## 2015-08-23 NOTE — Anesthesia Postprocedure Evaluation (Signed)
  Anesthesia Post-op Note  Patient: [name]  Procedure(s) Performed: Procedure(s): LEFT BREAST PARTIAL MASTECTOMY WITH SENTINEL LYMPH NODE MAPPING (Left) REMOVAL PORT-A-CATH (Right)  Patient Location: PACU  Anesthesia Type: General, Regional   Level of Consciousness: awake, alert  and oriented  Airway and Oxygen Therapy: Patient Spontanous Breathing  Post-op Pain: mild  Post-op Assessment: Post-op Vital signs reviewed  Post-op Vital Signs: Reviewed  Last Vitals:  Filed Vitals:   08/23/15 1600  BP: 160/92  Pulse: 77  Temp:   Resp: 19    Complications: No apparent anesthesia complications

## 2015-08-23 NOTE — Progress Notes (Signed)
Assisted Dr. Crews with left, ultrasound guided, pectoralis block. Side rails up, monitors on throughout procedure. See vital signs in flow sheet. Tolerated Procedure well. 

## 2015-08-24 ENCOUNTER — Encounter (HOSPITAL_BASED_OUTPATIENT_CLINIC_OR_DEPARTMENT_OTHER): Payer: Self-pay | Admitting: Surgery

## 2015-08-30 ENCOUNTER — Encounter: Payer: Self-pay | Admitting: *Deleted

## 2015-08-30 ENCOUNTER — Ambulatory Visit (HOSPITAL_BASED_OUTPATIENT_CLINIC_OR_DEPARTMENT_OTHER): Payer: Medicare Other | Admitting: Hematology and Oncology

## 2015-08-30 ENCOUNTER — Encounter: Payer: Self-pay | Admitting: Hematology and Oncology

## 2015-08-30 VITALS — BP 158/68 | HR 87 | Temp 97.8°F | Resp 18 | Ht 61.0 in | Wt 131.9 lb

## 2015-08-30 DIAGNOSIS — C50412 Malignant neoplasm of upper-outer quadrant of left female breast: Secondary | ICD-10-CM | POA: Diagnosis not present

## 2015-08-30 DIAGNOSIS — Z171 Estrogen receptor negative status [ER-]: Secondary | ICD-10-CM

## 2015-08-30 DIAGNOSIS — C773 Secondary and unspecified malignant neoplasm of axilla and upper limb lymph nodes: Secondary | ICD-10-CM

## 2015-08-30 NOTE — Assessment & Plan Note (Addendum)
Left breast invasive ductal carcinoma grade 3 ER 0% PR 0% HER-2 negative Ki-67 75%, 2.9 cm mass with microcalcifications plus abnormal lymph nodes biopsy-proven to be breast cancer T2 N1 M0 stage IIB clinical stage S/P Neoadjuvant Dose dense AC folll by Abraxane X 12  left lumpectomy 08/23/2015 : Invasive ductal carcinoma grade 3, 2.8 cm, associated extensive DCIS with comedonecrosis, margins negative,1/2 sentinel nodes positive, ER 0%, PR 0% T2 N1 M0 stage IIB  Pathology review: I discussed final pathology report of the patient and explained that there was still significant amount residual disease including involvement of lymph node. I discussed different options including axillary lymph node dissection versus adjuvant radiation therapy. Ideally she would be treated on ALLIANCE clinical trial. We will present her case in the tumor board and come up with a final treatment plan.  Treatment plan: I discussed the role of adjuvant Xeloda in decreasing the risk of recurrence. I do recommend starting Xeloda after radiation is complete. We discussed the risks and benefits of Xeloda including the risk of diarrhea, hand-foot syndrome, cytopenias, fatigue, mucositis etc. This is based upon the following clinical trial which showed its benefit. CREATE-X study enrolled 910 patient's with triple negative breast cancer and residual disease after neo-adjuvant therapy. 455 patients assigned to 1250 mg/m times daily 2 weeks on 1 week off  up to 8 cycles, at 5 years PFS 74.1% with Xeloda compared to 67.7% in control arm, 30% reduction in risk, overall survival 89.2% versus 83.9%, hand-foot syndrome was seen in 72.3% with Xeloda grade 3 in 10.9%  Return to clinic after radiation is complete

## 2015-08-30 NOTE — Progress Notes (Signed)
Patient Care Team: Thressa Sheller, MD as PCP - General (Internal Medicine) Erroll Luna, MD as Consulting Physician (General Surgery) Nicholas Lose, MD as Consulting Physician (Hematology and Oncology) Gery Pray, MD as Consulting Physician (Radiation Oncology) Mauro Kaufmann, RN as Registered Nurse Rockwell Germany, RN as Registered Nurse  DIAGNOSIS: Breast cancer of upper-outer quadrant of left female breast [facility])   Staging form: Breast, AJCC 7th Edition     Clinical stage from 03/07/2015: Stage IIB (T2, N1, M0) - Unsigned       Staging comments: Staged at breast conference on 5.18.16      Pathologic stage from 08/27/2015: Stage IIB (yT2, N1a, cM0) - Unsigned       Staging comments: Staged on final surgical specimen by Dr. Avis Epley    SUMMARY OF ONCOLOGIC HISTORY:   Breast cancer of upper-outer quadrant of left female breast ([place])   02/15/2015 Mammogram Left breast increased density, mass is irregular with microcalcifications measuring 2.9 cm, cyst in the breast as well as abnormal enlarged lymph nodes   02/20/2015 Initial Diagnosis Left breast biopsy: Invasive ductal carcinoma with DCIS, axillary lymph node biopsy positive, ER 0%, PR 0%, HER-2 negative ratio 1.13 with Ki-67 75%   03/15/2015 PET scan 2.8 cm solid irregular left breast mass which is hypermetabolic and consistent with known left breast cancer. 2. Weakly positive axillary lymph nodes or equivocal   03/20/2015 Breast MRI 3.1 cm irregular enhancing mass located within the left breast at the 1 o'clock position; 1.6 cm Left axillary LN   03/22/2015 - 08/02/2015 Neo-Adjuvant Chemotherapy Neo-adjuvant chemotherapy with dose dense Adriamycin and Cytoxan 4 followed by Abraxane weekly 12   08/06/2015 Breast MRI left breast now measures 0.9 x 0.8 x 1.9 cm (previously measured 3.0 x 3.1 x 1.8 cm). Dec size of abnormal Left axill LN   08/23/2015 Surgery left lumpectomy: Invasive ductal carcinoma grade 3, 2.8 cm, associated extensive DCIS  with comedonecrosis, margins negative,1/2 sentinel nodes positive, ER 0%, PR 0%    CHIEF COMPLIANT: follow-up after surgery  INTERVAL HISTORY: Carla Bennett is a 80 year old with above-mentioned history of left breast cancer triple negative disease who underwent neoadjuvant chemotherapy followed by lumpectomy is here today to discuss the pathology report. She is recovering fairly well from the surgery. She is here to discuss different options  REVIEW OF SYSTEMS:   Constitutional: Denies fevers, chills or abnormal weight loss Eyes: Denies blurriness of vision Ears, nose, mouth, throat, and face: Denies mucositis or sore throat Respiratory: Denies cough, dyspnea or wheezes Cardiovascular: Denies palpitation, chest discomfort or lower extremity swelling Gastrointestinal:  Denies nausea, heartburn or change in bowel habits Skin: Denies abnormal skin rashes Lymphatics: Denies new lymphadenopathy or easy bruising Neurological:Denies numbness, tingling or new weaknesses Behavioral/Psych: Mood is stable, no new changes   All other systems were reviewed with the patient and are negative.  I have reviewed the past medical history, past surgical history, social history and family history with the patient and they are unchanged from previous note.  ALLERGIES:  has No Known Allergies.  MEDICATIONS:  Current Outpatient Prescriptions  Medication Sig Dispense Refill  . B Complex Vitamins (VITAMIN B COMPLEX PO) Take 1 tablet by mouth daily.     . furosemide (LASIX) 20 MG tablet Take 20 mg by mouth daily.     Marland Kitchen labetalol (NORMODYNE) 100 MG tablet Take 1 tablet (100 mg total) by mouth daily. (Patient taking differently: Take 100 mg by mouth daily. Pt takes 1/2 tablet daily=  50 mg) 30 tablet 0  . lisinopril (PRINIVIL,ZESTRIL) 40 MG tablet Take 40 mg by mouth daily.     Marland Kitchen loratadine (CLARITIN) 10 MG tablet Take 10 mg by mouth daily.    . ondansetron (ZOFRAN) 8 MG tablet     .  oxyCODONE-acetaminophen (ROXICET) 5-325 MG tablet Take 1-2 tablets by mouth every 4 (four) hours as needed. 30 tablet 0   No current facility-administered medications for this visit.   Facility-Administered Medications Ordered in Other Visits  Medication Dose Route Frequency Provider Last Rate Last Dose  . sodium chloride 0.9 % injection 10 mL  10 mL Intracatheter PRN Nicholas Lose, MD   10 mL at 06/07/15 1559    PHYSICAL EXAMINATION: ECOG PERFORMANCE STATUS: 1 - Symptomatic but completely ambulatory  Filed Vitals:   08/30/15 0904  BP: 158/68  Pulse: 87  Temp: 97.8 F (36.6 C)  Resp: 18   Filed Weights   08/30/15 0904  Weight: 131 lb 14.4 oz (59.829 kg)    GENERAL:alert, no distress and comfortable SKIN: skin color, texture, turgor are normal, no rashes or significant lesions EYES: normal, Conjunctiva are pink and non-injected, sclera clear OROPHARYNX:no exudate, no erythema and lips, buccal mucosa, and tongue normal  NECK: supple, thyroid normal size, non-tender, without nodularity LYMPH:  no palpable lymphadenopathy in the cervical, axillary or inguinal LUNGS: clear to auscultation and percussion with normal breathing effort HEART: regular rate & rhythm and no murmurs and no lower extremity edema ABDOMEN:abdomen soft, non-tender and normal bowel sounds Musculoskeletal:no cyanosis of digits and no clubbing  NEURO: alert & oriented x 3 with fluent speech, no focal motor/sensory deficits  LABORATORY DATA:  I have reviewed the data as listed   Chemistry      Component Value Date/Time   NA 142 08/08/2015 0842   NA 134* 04/15/2015 1556   K 3.6 08/08/2015 0842   K 3.7 04/15/2015 1556   CL 100* 04/15/2015 1556   CO2 24 08/08/2015 0842   CO2 26 04/15/2015 1556   BUN 9.0 08/08/2015 0842   BUN 10 04/15/2015 1556   CREATININE 1.0 08/08/2015 0842   CREATININE 1.18* 04/15/2015 1556      Component Value Date/Time   CALCIUM 10.9* 08/08/2015 0842   CALCIUM 10.5 05/24/2015 1508    ALKPHOS 64 08/08/2015 0842   ALKPHOS 69 03/16/2015 1133   AST 20 08/08/2015 0842   AST 22 03/16/2015 1133   ALT 14 08/08/2015 0842   ALT 18 03/16/2015 1133   BILITOT 0.65 08/08/2015 0842   BILITOT 0.6 03/16/2015 1133       Lab Results  Component Value Date   WBC 4.8 08/08/2015   HGB 12.2 08/08/2015   HCT 37.0 08/08/2015   MCV 91.8 08/08/2015   PLT 276 08/08/2015   NEUTROABS 3.0 08/08/2015   ASSESSMENT & PLAN:  Breast cancer of upper-outer quadrant of left female breast Left breast invasive ductal carcinoma grade 3 ER 0% PR 0% HER-2 negative Ki-67 75%, 2.9 cm mass with microcalcifications plus abnormal lymph nodes biopsy-proven to be breast cancer T2 N1 M0 stage IIB clinical stage S/P Neoadjuvant Dose dense AC folll by Abraxane X 12  left lumpectomy 08/23/2015 : Invasive ductal carcinoma grade 3, 2.8 cm, associated extensive DCIS with comedonecrosis, margins negative,1/2 sentinel nodes positive, ER 0%, PR 0% T2 N1 M0 stage IIB  Pathology review: I discussed final pathology report of the patient and explained that there was still significant amount residual disease including involvement of lymph node. I  discussed different options including axillary lymph node dissection versus adjuvant radiation therapy. Ideally she would be treated on ALLIANCE clinical trial. We will present her case in the tumor board and come up with a final treatment plan.  I spent 30 minutes discussing the ALLIANCE clinical trial and his randomization options. She is now more interested in this trial. I provided her with a new consent form.  Treatment plan: I also discussed the role of adjuvant Xeloda in decreasing the risk of recurrence. I do recommend starting Xeloda after radiation is complete. We discussed the risks and benefits of Xeloda including the risk of diarrhea, hand-foot syndrome, cytopenias, fatigue, mucositis etc. This is based upon the following clinical trial which showed its benefit. CREATE-X  study enrolled 910 patient's with triple negative breast cancer and residual disease after neo-adjuvant therapy. 455 patients assigned to 1250 mg/m times daily 2 weeks on 1 week off  up to 8 cycles, at 5 years PFS 74.1% with Xeloda compared to 67.7% in control arm, 30% reduction in risk, overall survival 89.2% versus 83.9%, hand-foot syndrome was seen in 72.3% with Xeloda grade 3 in 10.9%  Return to clinic after radiation is complete  No orders of the defined types were placed in this encounter.   The patient has a good understanding of the overall plan. she agrees with it. she will call with any problems that may develop before the next visit here.   Rulon Eisenmenger, MD 08/30/2015

## 2015-09-04 NOTE — Progress Notes (Signed)
Location of Breast Cancer: Breast cancer of upper-outer quadrant of left female breast   Histology per Pathology Report:  08/23/15 Diagnosis 1. Breast, lumpectomy, left - INVASIVE GRADE III DUCTAL CARCINOMA, SPANNING 2.8 CM IN GREATEST DIMENSION. - ASSOCIATED EXTENSIVE HIGH GRADE DUCTAL CARCINOMA IN SITU WITH CALCIFICATIONS AND COMEDONECROSIS. - MARGINS ARE NEGATIVE. - SEE ONCOLOGY TEMPLATE. 2. Lymph node, sentinel, biopsy, left axillary - ONE LYMPH NODE POSITIVE FOR METASTATIC DUCTAL CARCINOMA (1/1). 3. Lymph node, sentinel, biopsy - ONE BENIGN LYMPH NODE WITH NO TUMOR SEEN (0/1).  02/20/15 Diagnosis 1. Lymph node, needle/core biopsy - ONE LYMPH NODE, POSITIVE FOR METASTATIC MAMMARY CARCINOMA (1/1), SEE COMMENT. 2. Breast, left, needle core biopsy - INVASIVE DUCTAL CARCINOMA, SEE COMMENT. - DUCTAL CARCINOMA IN SITU.  Receptor Status: ER(0%), PR (0%), Her2-neu (negative)  Did patient present with symptoms (if so, please note symptoms) or was this found on screening mammography?: the patient was examined by her physician earlier this year and was noted to have increased density in the left breast prompting workup.   Past/Anticipated interventions by surgeon, if any: 08/23/15 - Procedure: LEFT BREAST PARTIAL MASTECTOMY WITH SENTINEL LYMPH NODE MAPPING;  Surgeon: Erroll Luna, MD;  Location: [place];  Service: General;  Laterality: Left  Past/Anticipated interventions by medical oncology, if any:   03/22/2015 - 08/02/2015 Neo-Adjuvant Chemotherapy Neo-adjuvant chemotherapy with dose dense Adriamycin and Cytoxan 4 followed by Abraxane weekly 12       Xeloda to follow radiation.  Lymphedema issues, if any:  no  Pain issues, if any:  no - does have soreness in her incision area.  SAFETY ISSUES:  Prior radiation? no  Pacemaker/ICD? no  Possible current pregnancy?no  Is the patient on methotrexate? no  Current Complaints / other details:  Patient is here  with son.   BP 145/76 mmHg  Pulse 79  Temp(Src) 97.4 F (36.3 C) (Oral)  Resp 18  Ht _0  (1.549 m)  Wt 131 lb 12.8 oz (59.784 kg)  BMI 24.92 kg/m2  LMP 10/20/1996

## 2015-09-06 ENCOUNTER — Ambulatory Visit
Admission: RE | Admit: 2015-09-06 | Discharge: 2015-09-06 | Disposition: A | Discharge status: Home / Self Care | Payer: Medicare Other | Source: Ambulatory Visit | Attending: Radiation Oncology | Admitting: Radiation Oncology

## 2015-09-06 ENCOUNTER — Encounter: Payer: Self-pay | Admitting: Radiation Oncology

## 2015-09-06 VITALS — BP 145/76 | HR 79 | Temp 97.4°F | Resp 18 | Ht 61.0 in | Wt 131.8 lb

## 2015-09-06 DIAGNOSIS — Z171 Estrogen receptor negative status [ER-]: Secondary | ICD-10-CM | POA: Insufficient documentation

## 2015-09-06 DIAGNOSIS — Z510 Encounter for antineoplastic radiation therapy: Secondary | ICD-10-CM | POA: Insufficient documentation

## 2015-09-06 DIAGNOSIS — C773 Secondary and unspecified malignant neoplasm of axilla and upper limb lymph nodes: Secondary | ICD-10-CM | POA: Insufficient documentation

## 2015-09-06 DIAGNOSIS — C50412 Malignant neoplasm of upper-outer quadrant of left female breast: Secondary | ICD-10-CM | POA: Diagnosis not present

## 2015-09-06 NOTE — Progress Notes (Signed)
Radiation Oncology         [phone] ________________________________  Name: Carla Bennett MRN: [id number]  Date: 09/06/2015  DOB: 12-10-1934  Follow-Up Visit Note  CC: Thressa Sheller, MD  Erroll Luna, MD    ICD-9-CM ICD-10-CM   1. Breast cancer of upper-outer quadrant of left female breast ([place]) 174.4 C50.412     Diagnosis: Pathologic T2, N1, invasive ductal carcinoma of the left breast  Narrative:  The patient returns today for re-consultation. She was initially seen in the multidisciplinary breast clinic several months ago. Since that time the patient has proceeded with neoadjuvant chemotherapy followed by lumpectomy and sentinel node procedure. The patient had a lumpectomy of the left breast on 08/23/15 by Dr. Brantley Stage. This revealed grade 3, Invasive ductal carcinoma, 2.8 cm, with associated extensive DCIS with comedonecrosis. The margins are negative and 1 out of 2 sentinel nodes were positive, ER 0%, PR 0%, Her2 negative. Based on initial tumor size measurements and pathologic findings patient had minimal response to her neoadjuvant chemotherapy. She saw Dr. Lindi Adie on 11/10 who referred the patient to back to me to further discuss the role of radiation for the management of her disease. Dr. Lindi Adie recommended Xeloda post-radiation.  Dr. Lindi Adie discussed different options including axillary lymph node dissection versus adjuvant radiation therapy. She meets the criteria of the ALLIANCE clinical trial.  The patient presents to the clinic with her son. She denies chills, a fever, or swelling of her left arm. The patient complains of discomfort and soreness of her lumpectomy site.    Breast cancer of upper-outer quadrant of left female breast ([place])   02/15/2015 Mammogram Left breast increased density, mass is irregular with microcalcifications measuring 2.9 cm, cyst in the breast as well as abnormal enlarged lymph nodes   02/20/2015 Initial Diagnosis Left breast biopsy: Invasive  ductal carcinoma with DCIS, axillary lymph node biopsy positive, ER 0%, PR 0%, HER-2 negative ratio 1.13 with Ki-67 75%   03/15/2015 PET scan 2.8 cm solid irregular left breast mass which is hypermetabolic and consistent with known left breast cancer. 2. Weakly positive axillary lymph nodes or equivocal   03/20/2015 Breast MRI 3.1 cm irregular enhancing mass located within the left breast at the 1 o'clock position; 1.6 cm Left axillary LN   03/22/2015 - 08/02/2015 Neo-Adjuvant Chemotherapy Neo-adjuvant chemotherapy with dose dense Adriamycin and Cytoxan 4 followed by Abraxane weekly 12   08/06/2015 Breast MRI left breast now measures 0.9 x 0.8 x 1.9 cm (previously measured 3.0 x 3.1 x 1.8 cm). Dec size of abnormal Left axill LN   08/23/2015 Surgery left lumpectomy: Invasive ductal carcinoma grade 3, 2.8 cm, associated extensive DCIS with comedonecrosis, margins negative,1/2 sentinel nodes positive, ER 0%, PR 0%                               ALLERGIES:  has No Known Allergies.  Meds: Current Outpatient Prescriptions  Medication Sig Dispense Refill  . B Complex Vitamins (VITAMIN B COMPLEX PO) Take 1 tablet by mouth daily.     . furosemide (LASIX) 20 MG tablet Take 20 mg by mouth daily.     Marland Kitchen labetalol (NORMODYNE) 100 MG tablet Take 1 tablet (100 mg total) by mouth daily. (Patient taking differently: Take 100 mg by mouth daily. Pt takes 1/2 tablet daily= 50 mg) 30 tablet 0  . lisinopril (PRINIVIL,ZESTRIL) 40 MG tablet Take 40 mg by mouth daily.     Marland Kitchen  loratadine (CLARITIN) 10 MG tablet Take 10 mg by mouth daily.    . ondansetron (ZOFRAN) 8 MG tablet     . oxyCODONE-acetaminophen (ROXICET) 5-325 MG tablet Take 1-2 tablets by mouth every 4 (four) hours as needed. (Patient not taking: Reported on 09/06/2015) 30 tablet 0   No current facility-administered medications for this encounter.   Facility-Administered Medications Ordered in Other Encounters  Medication Dose Route Frequency Provider Last Rate  Last Dose  . sodium chloride 0.9 % injection 10 mL  10 mL Intracatheter PRN Nicholas Lose, MD   10 mL at 06/07/15 1559    Physical Findings: The patient is in no acute distress. Patient is alert and oriented.  height is '5\' 1"'  (1.549 m) and weight is 131 lb 12.8 oz (59.784 kg). Her oral temperature is 97.4 F (36.3 C). Her blood pressure is 145/76 and her pulse is 79. Her respiration is 18. Marland Kitchen  No palpable supraclavicular or axillary adenopathy. Lungs are clear to auscultation. The heart has a regular rhythm and rate. Examination of the left breast area reveals a well-healing lumpectomy scar in the upper outer quadrant. The patient also has a second scar and axillary region from sentinel procedure.  Lab Findings: Lab Results  Component Value Date   WBC 4.8 08/08/2015   HGB 12.2 08/08/2015   HCT 37.0 08/08/2015   MCV 91.8 08/08/2015   PLT 276 08/08/2015    Radiographic Findings: Nm Sentinel Node Inj-no Rpt (breast)  08/23/2015  CLINICAL DATA: left breast cancer Sulfur colloid was injected intradermally by the nuclear medicine technologist for breast cancer sentinel node localization.    Impression: Pathologic T2, N1, invasive ductal carcinoma of the left breast. Patient has above had minimal response to her neoadjuvant treatment. We discussed further therapy directed at the axillary area including the axillary node dissection versus comprehensive radiation therapy. We also discussed the Alliance trial in detail and the patient has a copy of this protocol. Patient at this time is undecided whether she wishes to proceed with the Alliance trial,  axillary dissection or comprehensive radiation therapy off protocol. She will call back early next week with her final decision. She understands that her radiation therapy will include the left breast and understands the importance of this versus lumpectomy alone in terms of local recurrence within the  breast.      ____________________________________  Blair Promise, PhD, MD  This document serves as a record of services personally performed by Gery Pray, MD. It was created on his behalf by Darcus Austin, a trained medical scribe. The creation of this record is based on the scribe's personal observations and the provider's statements to them. This document has been checked and approved by the attending provider.

## 2015-09-07 ENCOUNTER — Telehealth: Payer: Self-pay | Admitting: Oncology

## 2015-09-07 NOTE — Telephone Encounter (Signed)
Carla Bennett left a message saying that she has decided to go ahead with radiation without the lymph node biopsy.

## 2015-09-11 ENCOUNTER — Telehealth: Payer: Self-pay | Admitting: *Deleted

## 2015-09-11 NOTE — Telephone Encounter (Signed)
Pt relate she has decided to have xrt without ALND. Silvano Bilis, Dr. Clabe Seal RN to discuss pts decision.  Santiago Glad relayed she will relay the information to Dr. Sondra Come to have him order SIM.

## 2015-09-11 NOTE — Telephone Encounter (Deleted)
Called pt to assess needs and to see if she had made a decision regarding treatment. Pt relate

## 2015-09-20 ENCOUNTER — Ambulatory Visit
Admission: RE | Admit: 2015-09-20 | Discharge: 2015-09-20 | Disposition: A | Discharge status: Home / Self Care | Payer: Medicare Other | Source: Ambulatory Visit | Attending: Radiation Oncology | Admitting: Radiation Oncology

## 2015-09-20 ENCOUNTER — Encounter: Payer: Self-pay | Admitting: Radiation Oncology

## 2015-09-20 VITALS — BP 141/70 | HR 72 | Temp 97.5°F | Resp 18 | Ht 61.0 in | Wt 131.4 lb

## 2015-09-20 DIAGNOSIS — C50412 Malignant neoplasm of upper-outer quadrant of left female breast: Secondary | ICD-10-CM

## 2015-09-20 DIAGNOSIS — Z171 Estrogen receptor negative status [ER-]: Secondary | ICD-10-CM | POA: Diagnosis not present

## 2015-09-20 DIAGNOSIS — C773 Secondary and unspecified malignant neoplasm of axilla and upper limb lymph nodes: Secondary | ICD-10-CM | POA: Diagnosis not present

## 2015-09-20 DIAGNOSIS — Z510 Encounter for antineoplastic radiation therapy: Secondary | ICD-10-CM | POA: Diagnosis not present

## 2015-09-20 NOTE — Progress Notes (Signed)
Location of Breast Cancer: Breast cancer of upper-outer quadrant of left female breast   Histology per Pathology Report:  08/23/15 Diagnosis 1. Breast, lumpectomy, left - INVASIVE GRADE III DUCTAL CARCINOMA, SPANNING 2.8 CM IN GREATEST DIMENSION. - ASSOCIATED EXTENSIVE HIGH GRADE DUCTAL CARCINOMA IN SITU WITH CALCIFICATIONS AND COMEDONECROSIS. - MARGINS ARE NEGATIVE. - SEE ONCOLOGY TEMPLATE. 2. Lymph node, sentinel, biopsy, left axillary - ONE LYMPH NODE POSITIVE FOR METASTATIC DUCTAL CARCINOMA (1/1). 3. Lymph node, sentinel, biopsy - ONE BENIGN LYMPH NODE WITH NO TUMOR SEEN (0/1).  02/20/15 Diagnosis 1. Lymph node, needle/core biopsy - ONE LYMPH NODE, POSITIVE FOR METASTATIC MAMMARY CARCINOMA (1/1), SEE COMMENT. 2. Breast, left, needle core biopsy - INVASIVE DUCTAL CARCINOMA, SEE COMMENT. - DUCTAL CARCINOMA IN SITU.  Receptor Status: ER(0%), PR (0%), Her2-neu (negative)  Did patient present with symptoms (if so, please note symptoms) or was this found on screening mammography?: the patient was examined by her physician earlier this year and was noted to have increased density in the left breast prompting workup.   Past/Anticipated interventions by surgeon, if any: 08/23/15 - Procedure: LEFT BREAST PARTIAL MASTECTOMY WITH SENTINEL LYMPH NODE MAPPING; Surgeon: Erroll Luna, MD; Location: [place]; Service: General; Laterality: Left  Past/Anticipated interventions by medical oncology, if any:   03/22/2015 - 08/02/2015 Neo-Adjuvant Chemotherapy Neo-adjuvant chemotherapy with dose dense Adriamycin and Cytoxan 4 followed by Abraxane weekly 12       Xeloda to follow radiation.  Lymphedema issues, if any: no  Pain issues, if any: no - does have soreness in her incision area.  SAFETY ISSUES:  Prior radiation? no  Pacemaker/ICD? no  Possible current pregnancy?no  Is the patient on methotrexate? no  Current Complaints / other details:Patient  is here with her daughter in law.  BP 141/70 mmHg  Pulse 72  Temp(Src) 97.5 F (36.4 C) (Oral)  Resp 18  Ht '5\' 1"'  (1.549 m)  Wt 131 lb 6.4 oz (59.603 kg)  BMI 24.84 kg/m2  LMP 10/20/1996

## 2015-09-20 NOTE — Progress Notes (Signed)
Please see the Nurse Progress Note in the MD Initial Consult Encounter for this patient. 

## 2015-09-20 NOTE — Progress Notes (Signed)
Radiation Oncology         [phone] ________________________________  Name: Carla Bennett MRN: [id number]  Date: 09/20/2015  DOB: [birth year]  Re-Evaluation Visit Note  CC: Thressa Sheller, MD  Nicholas Lose, MD    ICD-9-CM ICD-10-CM   1. Breast cancer of upper-outer quadrant of left female breast ([place]) 174.4 C50.412     Diagnosis: Pathologic T2, N1, invasive ductal carcinoma of the left breast  Narrative:  The patient returns today for re-evaluation. She was initially seen in the multidisciplinary breast clinic several months ago. Since that time the patient has proceeded with neoadjuvant chemotherapy followed by lumpectomy and sentinel node procedure. The patient had a lumpectomy of the left breast on 08/23/15 by Dr. Brantley Stage. This revealed grade 3, Invasive ductal carcinoma, 2.8 cm, with associated extensive DCIS with comedonecrosis. The margins are negative and 1 out of 2 sentinel nodes were positive, ER 0%, PR 0%, Her2 negative. Based on initial tumor size measurements and pathologic findings patient had minimal response to her neoadjuvant chemotherapy. She saw Dr. Lindi Adie on 11/10 who referred the patient to back to me, on 09/06/15,  to further discuss the role of radiation for the management of her disease. Dr. Lindi Adie recommended Xeloda post-radiation.  Dr. Lindi Adie also discussed different options including axillary lymph node dissection versus adjuvant radiation therapy. She meets the criteria of the ALLIANCE clinical trial.  The patient presents to the clinic to discuss her decision on whether she would like to partake in the Alliance trial. She also presents to the clinic with her daughter-in-law.     Breast cancer of upper-outer quadrant of left female breast ([place])   02/15/2015 Mammogram Left breast increased density, mass is irregular with microcalcifications measuring 2.9 cm, cyst in the breast as well as abnormal enlarged lymph nodes   02/20/2015 Initial Diagnosis Left breast  biopsy: Invasive ductal carcinoma with DCIS, axillary lymph node biopsy positive, ER 0%, PR 0%, HER-2 negative ratio 1.13 with Ki-67 75%   03/15/2015 PET scan 2.8 cm solid irregular left breast mass which is hypermetabolic and consistent with known left breast cancer. 2. Weakly positive axillary lymph nodes or equivocal   03/20/2015 Breast MRI 3.1 cm irregular enhancing mass located within the left breast at the 1 o'clock position; 1.6 cm Left axillary LN   03/22/2015 - 08/02/2015 Neo-Adjuvant Chemotherapy Neo-adjuvant chemotherapy with dose dense Adriamycin and Cytoxan 4 followed by Abraxane weekly 12   08/06/2015 Breast MRI left breast now measures 0.9 x 0.8 x 1.9 cm (previously measured 3.0 x 3.1 x 1.8 cm). Dec size of abnormal Left axill LN   08/23/2015 Surgery left lumpectomy: Invasive ductal carcinoma grade 3, 2.8 cm, associated extensive DCIS with comedonecrosis, margins negative,1/2 sentinel nodes positive, ER 0%, PR 0%                               ALLERGIES:  has No Known Allergies.  Meds: Current Outpatient Prescriptions  Medication Sig Dispense Refill  . B Complex Vitamins (VITAMIN B COMPLEX PO) Take 1 tablet by mouth daily.     . furosemide (LASIX) 20 MG tablet Take 20 mg by mouth daily.     Marland Kitchen labetalol (NORMODYNE) 100 MG tablet Take 1 tablet (100 mg total) by mouth daily. (Patient taking differently: Take 100 mg by mouth daily. Pt takes 1/2 tablet daily= 50 mg) 30 tablet 0  . lisinopril (PRINIVIL,ZESTRIL) 40 MG tablet Take 40 mg by mouth daily.     Marland Kitchen  loratadine (CLARITIN) 10 MG tablet Take 10 mg by mouth daily.    . ondansetron (ZOFRAN) 8 MG tablet     . oxyCODONE-acetaminophen (ROXICET) 5-325 MG tablet Take 1-2 tablets by mouth every 4 (four) hours as needed. (Patient not taking: Reported on 09/06/2015) 30 tablet 0   No current facility-administered medications for this encounter.   Facility-Administered Medications Ordered in Other Encounters  Medication Dose Route Frequency  Provider Last Rate Last Dose  . sodium chloride 0.9 % injection 10 mL  10 mL Intracatheter PRN Nicholas Lose, MD   10 mL at 06/07/15 1559    Physical Findings: The patient is in no acute distress. Patient is alert and oriented.  height is _0  (1.549 m) and weight is 131 lb 6.4 oz (59.603 kg). Her oral temperature is 97.5 F (36.4 C). Her blood pressure is 141/70 and her pulse is 72. Her respiration is 18.  No palpable supraclavicular or axillary adenopathy. Lungs are clear to auscultation. The heart has a regular rhythm and rate. Examination of the left breast area reveals a well-healing lumpectomy scar in the upper outer quadrant. The patient also has a second scar in the axillary region from the sentinel procedure.  Lab Findings: Lab Results  Component Value Date   WBC 4.8 08/08/2015   HGB 12.2 08/08/2015   HCT 37.0 08/08/2015   MCV 91.8 08/08/2015   PLT 276 08/08/2015    Radiographic Findings: Nm Sentinel Node Inj-no Rpt (breast)  08/23/2015  CLINICAL DATA: left breast cancer Sulfur colloid was injected intradermally by the nuclear medicine technologist for breast cancer sentinel node localization.    Impression: Pathologic T2, N1, invasive ductal carcinoma of the left breast. Patient above had minimal response to her neoadjuvant treatment. We discussed further therapy directed at the axillary area including the axillary node dissection versus comprehensive radiation therapy. We also discussed the Alliance trial in detail and the patient has a copy of this protocol. Patient at this time has decided to proceed with comprehensive radiation therapy rather than participate in the Alliance trial or return for axillary node dissection.   Plan: I spoke to the patient today regarding her diagnosis and options for treatment. We discussed the equivalence in terms of survival and local failure between mastectomy and breast conservation. We discussed the role of radiation in decreasing local  failures in patients who undergo lumpectomy. We discussed the process of simulation and the placement of tattoos. We discussed 4-6 weeks of treatment as an outpatient. We discussed the possibility of asymptomatic lung damage. We discussed the low likelihood of secondary malignancies. We discussed the possible side effects including but not limited to skin redness, fatigue, permanent skin darkening, and breast swelling.  We discussed the use of cardiac sparing with deep inspiration breath hold if needed.  The patient signed a consent form and this was placed her a medical chart. CT simulation has been scheduled after this encounter. She will be treated with comprehensive radiation therapy including the left breast,  axillary and supraclavicular region initially followed by a boost to the lumpectomy cavity.  ____________________________________  Blair Promise, PhD, MD  This document serves as a record of services personally performed by Gery Pray, MD. It was created on his behalf by Darcus Austin, a trained medical scribe. The creation of this record is based on the scribe's personal observations and the provider's statements to them. This document has been checked and approved by the attending provider.

## 2015-09-20 NOTE — Progress Notes (Signed)
  Radiation Oncology         [phone]) [phone] ________________________________  Name: Carla Bennett MRN: [id number]  Date: 09/20/2015  DOB: April 06, 1935  SIMULATION AND TREATMENT PLANNING NOTE   DIAGNOSIS: Pathologic T2, N1, invasive ductal carcinoma of the left breast  NARRATIVE:  The patient was brought to the [facility].  Identity was confirmed.  All relevant records and images related to the planned course of therapy were reviewed.  The patient freely provided informed written consent to proceed with treatment after reviewing the details related to the planned course of therapy. The consent form was witnessed and verified by the simulation staff.  Then, the patient was set-up in a stable reproducible  supine position for radiation therapy.  CT images were obtained.  Surface markings were placed.  The CT images were loaded into the planning software.  Then the target and avoidance structures were contoured.  Treatment planning then occurred.  The radiation prescription was entered and confirmed.  Then, I designed and supervised the construction of a total of 5 medically necessary complex treatment devices.  I have requested : 3D Simulation  I have requested a DVH of the following structures: heart, lungs, lumpectomy cavity.  I have ordered:dose calc.  PLAN:  The patient will receive 50.4 Gy in 28 fractions directed at the left breast.  The axillary/left scf will receive 45 Gy in 25 fxs.  The lumpectomy cavity will be boosted 10 Gy in 5 fxs for a cumulative dose of 60.4 Gy.  ________________________________  Blair Promise, PhD, MD  This document serves as a record of services personally performed by Gery Pray, MD. It was created on his behalf by Darcus Austin, a trained medical scribe. The creation of this record is based on the scribe's personal observations and the provider's statements to them. This document has been checked and approved by the attending provider.

## 2015-09-24 NOTE — Progress Notes (Signed)
Special treatment procedure   was performed today due to the extra time and effort required by myself to plan and prepare this patient for deep inspiration breath hold technique.  I have determined cardiac sparing to be of benefit to this patient to prevent long term cardiac damage due to radiation of the heart.  Bellows were placed on the patient's abdomen. To facilitate cardiac sparing, the patient was coached by the radiation therapists on breath hold techniques and breathing practice was performed. Practice waveforms were obtained. The patient was then scanned while maintaining breath hold in the treatment position.  This image was then transferred over to the imaging specialist. The imaging specialist then created a fusion of the free breathing and breath hold scans using the chest wall as the stable structure. I personally reviewed the fusion in axial, coronal and sagittal image planes.  Excellent cardiac sparing was obtained.  I felt the patient is an appropriate candidate for breath hold and the patient will be treated as such.  The image fusion was then reviewed with the patient to reinforce the necessity of reproducible breath hold.  -----------------------------------  Blair Promise, PhD, MD

## 2015-09-24 NOTE — Progress Notes (Signed)
  Radiation Oncology         [phone] ________________________________  Name: Carla Bennett MRN: [id number]  Date: 09/20/2015  DOB: 12-05-1934  Optical Surface Tracking Plan:  Since intensity modulated radiotherapy (IMRT) and 3D conformal radiation treatment methods are predicated on accurate and precise positioning for treatment, intrafraction motion monitoring is medically necessary to ensure accurate and safe treatment delivery.  The ability to quantify intrafraction motion without excessive ionizing radiation dose can only be performed with optical surface tracking. Accordingly, surface imaging offers the opportunity to obtain 3D measurements of patient position throughout IMRT and 3D treatments without excessive radiation exposure.  I am ordering optical surface tracking for this patient's upcoming course of radiotherapy. ________________________________  Gery Pray, MD    Reference:   Particia Jasper, et al. Surface imaging-based analysis of intrafraction motion for breast radiotherapy patients.Journal of [place], n. 6, nov. 2014. ISSN [id number].   Available at: <http://www.jacmp.org/index.php/jacmp/article/view/4957>.

## 2015-09-25 DIAGNOSIS — Z171 Estrogen receptor negative status [ER-]: Secondary | ICD-10-CM | POA: Diagnosis not present

## 2015-09-25 DIAGNOSIS — C50412 Malignant neoplasm of upper-outer quadrant of left female breast: Secondary | ICD-10-CM | POA: Diagnosis not present

## 2015-09-25 DIAGNOSIS — Z510 Encounter for antineoplastic radiation therapy: Secondary | ICD-10-CM | POA: Diagnosis not present

## 2015-09-25 DIAGNOSIS — C773 Secondary and unspecified malignant neoplasm of axilla and upper limb lymph nodes: Secondary | ICD-10-CM | POA: Diagnosis not present

## 2015-09-27 ENCOUNTER — Ambulatory Visit
Admission: RE | Admit: 2015-09-27 | Discharge: 2015-09-27 | Disposition: A | Discharge status: Home / Self Care | Payer: Medicare Other | Source: Ambulatory Visit | Attending: Radiation Oncology | Admitting: Radiation Oncology

## 2015-09-27 DIAGNOSIS — C773 Secondary and unspecified malignant neoplasm of axilla and upper limb lymph nodes: Secondary | ICD-10-CM | POA: Diagnosis not present

## 2015-09-27 DIAGNOSIS — C50412 Malignant neoplasm of upper-outer quadrant of left female breast: Secondary | ICD-10-CM

## 2015-09-27 DIAGNOSIS — Z171 Estrogen receptor negative status [ER-]: Secondary | ICD-10-CM | POA: Diagnosis not present

## 2015-09-27 DIAGNOSIS — Z510 Encounter for antineoplastic radiation therapy: Secondary | ICD-10-CM | POA: Diagnosis not present

## 2015-09-27 NOTE — Progress Notes (Signed)
  Radiation Oncology         (336) [phone] ________________________________  Name: Carla Bennett MRN: [id number]  Date: 09/27/2015  DOB: 1935/04/10  Simulation Verification Note    ICD-9-CM ICD-10-CM   1. Breast cancer of upper-outer quadrant of left female breast ([place]) 174.4 C50.412     Status: outpatient  NARRATIVE: The patient was brought to the treatment unit and placed in the planned treatment position. The clinical setup was verified. Then port films were obtained and uploaded to the radiation oncology medical record software.  The treatment beams were carefully compared against the planned radiation fields. The position location and shape of the radiation fields was reviewed. They targeted volume of tissue appears to be appropriately covered by the radiation beams. Organs at risk appear to be excluded as planned.  Based on my personal review, I approved the simulation verification. The patient's treatment will proceed as planned.  -----------------------------------  Blair Promise, PhD, MD

## 2015-10-01 ENCOUNTER — Ambulatory Visit
Admission: RE | Admit: 2015-10-01 | Discharge: 2015-10-01 | Disposition: A | Discharge status: Home / Self Care | Payer: Medicare Other | Source: Ambulatory Visit | Attending: Radiation Oncology | Admitting: Radiation Oncology

## 2015-10-01 DIAGNOSIS — Z171 Estrogen receptor negative status [ER-]: Secondary | ICD-10-CM | POA: Diagnosis not present

## 2015-10-01 DIAGNOSIS — C773 Secondary and unspecified malignant neoplasm of axilla and upper limb lymph nodes: Secondary | ICD-10-CM | POA: Diagnosis not present

## 2015-10-01 DIAGNOSIS — Z510 Encounter for antineoplastic radiation therapy: Secondary | ICD-10-CM | POA: Diagnosis not present

## 2015-10-01 DIAGNOSIS — C50412 Malignant neoplasm of upper-outer quadrant of left female breast: Secondary | ICD-10-CM | POA: Diagnosis not present

## 2015-10-02 ENCOUNTER — Ambulatory Visit
Admission: RE | Admit: 2015-10-02 | Discharge: 2015-10-02 | Disposition: A | Discharge status: Home / Self Care | Payer: Medicare Other | Source: Ambulatory Visit | Attending: Radiation Oncology | Admitting: Radiation Oncology

## 2015-10-02 ENCOUNTER — Encounter: Payer: Self-pay | Admitting: Radiation Oncology

## 2015-10-02 VITALS — BP 146/66 | HR 67 | Temp 98.0°F | Resp 16 | Ht 61.0 in | Wt 131.6 lb

## 2015-10-02 DIAGNOSIS — C50412 Malignant neoplasm of upper-outer quadrant of left female breast: Secondary | ICD-10-CM | POA: Diagnosis not present

## 2015-10-02 DIAGNOSIS — Z510 Encounter for antineoplastic radiation therapy: Secondary | ICD-10-CM | POA: Diagnosis not present

## 2015-10-02 DIAGNOSIS — C773 Secondary and unspecified malignant neoplasm of axilla and upper limb lymph nodes: Secondary | ICD-10-CM | POA: Diagnosis not present

## 2015-10-02 DIAGNOSIS — Z171 Estrogen receptor negative status [ER-]: Secondary | ICD-10-CM | POA: Diagnosis not present

## 2015-10-02 MED ORDER — ALRA NON-METALLIC DEODORANT (RAD-ONC)
1.0000 "application " | Freq: Once | TOPICAL | Status: AC
  Administered 2015-10-02: 1 via TOPICAL

## 2015-10-02 MED ORDER — RADIAPLEXRX EX GEL
Freq: Once | CUTANEOUS | Status: AC
  Administered 2015-10-02: 13:00:00 via TOPICAL

## 2015-10-02 NOTE — Progress Notes (Signed)
Carla Bennett has completed 2 fractions to her left breast and subclavian area.  She denies pain and fatigue.  She does report having numbness in her feet - right worse than left that started last week.  The skin on her left breast is intact.  BP 146/66 mmHg  Pulse 67  Temp(Src) 98 F (36.7 C) (Oral)  Resp 16  Ht 5\' 1"  (1.549 m)  Wt 131 lb 9.6 oz (59.693 kg)  BMI 24.88 kg/m2  LMP 10/20/1996

## 2015-10-02 NOTE — Progress Notes (Signed)
  Radiation Oncology         [phone] ________________________________  Name: Carla Bennett MRN: [id number]  Date: 10/02/2015  DOB: Mar 25, 1935  Weekly Radiation Therapy Management    ICD-9-CM ICD-10-CM   1. Breast cancer of upper-outer quadrant of left female breast ([place]) 174.4 C50.412    DIAGNOSIS: Pathologic T2, N1, invasive ductal carcinoma of the left breast  Current Dose: 3.6 Gy     Planned Dose:  60.4 Gy  Narrative . . . . . . . . The patient presents for routine under treatment assessment.                                   The patient is without complaint.                                 Set-up films were reviewed.                                 The chart was checked. Physical Findings. . .  height is 5\' 1"  (1.549 m) and weight is 131 lb 9.6 oz (59.693 kg). Her oral temperature is 98 F (36.7 C). Her blood pressure is 146/66 and her pulse is 67. Her respiration is 16. . Weight essentially stable.  No significant changes. The lungs are clear. The heart has a regular rhythm and rate. No significant reaction noted in the left breast area. Impression . . . . . . . The patient is tolerating radiation. Plan . . . . . . . . . . . . Continue treatment as planned.  ________________________________   Blair Promise, PhD, MD

## 2015-10-02 NOTE — Progress Notes (Signed)
Pt here for patient teaching.  Pt given Radiation and You booklet, skin care instructions, Alra deodorant and Radiaplex gel. Pt reports they have not watched the Radiation Therapy Education video and has been given the link to watch at home.  Reviewed areas of pertinence such as fatigue, skin changes, breast tenderness and breast swelling . Pt able to give teach back of to pat skin and use unscented/gentle soap,apply Radiaplex bid, avoid applying anything to skin within 4 hours of treatment and avoid wearing an under wire bra. Pt demonstrated understanding and verbalizes understanding of information given and will contact nursing with any questions or concerns.     Http://rtanswers.org/treatmentinformation/whattoexpect/index

## 2015-10-03 ENCOUNTER — Ambulatory Visit
Admission: RE | Admit: 2015-10-03 | Discharge: 2015-10-03 | Disposition: A | Discharge status: Home / Self Care | Payer: Medicare Other | Source: Ambulatory Visit | Attending: Radiation Oncology | Admitting: Radiation Oncology

## 2015-10-03 DIAGNOSIS — C773 Secondary and unspecified malignant neoplasm of axilla and upper limb lymph nodes: Secondary | ICD-10-CM | POA: Diagnosis not present

## 2015-10-03 DIAGNOSIS — Z510 Encounter for antineoplastic radiation therapy: Secondary | ICD-10-CM | POA: Diagnosis not present

## 2015-10-03 DIAGNOSIS — Z171 Estrogen receptor negative status [ER-]: Secondary | ICD-10-CM | POA: Diagnosis not present

## 2015-10-03 DIAGNOSIS — C50412 Malignant neoplasm of upper-outer quadrant of left female breast: Secondary | ICD-10-CM | POA: Diagnosis not present

## 2015-10-04 ENCOUNTER — Ambulatory Visit
Admission: RE | Admit: 2015-10-04 | Discharge: 2015-10-04 | Disposition: A | Discharge status: Home / Self Care | Payer: Medicare Other | Source: Ambulatory Visit | Attending: Radiation Oncology | Admitting: Radiation Oncology

## 2015-10-04 DIAGNOSIS — Z510 Encounter for antineoplastic radiation therapy: Secondary | ICD-10-CM | POA: Diagnosis not present

## 2015-10-04 DIAGNOSIS — C773 Secondary and unspecified malignant neoplasm of axilla and upper limb lymph nodes: Secondary | ICD-10-CM | POA: Diagnosis not present

## 2015-10-04 DIAGNOSIS — Z171 Estrogen receptor negative status [ER-]: Secondary | ICD-10-CM | POA: Diagnosis not present

## 2015-10-04 DIAGNOSIS — C50412 Malignant neoplasm of upper-outer quadrant of left female breast: Secondary | ICD-10-CM | POA: Diagnosis not present

## 2015-10-05 ENCOUNTER — Ambulatory Visit
Admission: RE | Admit: 2015-10-05 | Discharge: 2015-10-05 | Disposition: A | Discharge status: Home / Self Care | Payer: Medicare Other | Source: Ambulatory Visit | Attending: Radiation Oncology | Admitting: Radiation Oncology

## 2015-10-05 DIAGNOSIS — Z171 Estrogen receptor negative status [ER-]: Secondary | ICD-10-CM | POA: Diagnosis not present

## 2015-10-05 DIAGNOSIS — C773 Secondary and unspecified malignant neoplasm of axilla and upper limb lymph nodes: Secondary | ICD-10-CM | POA: Diagnosis not present

## 2015-10-05 DIAGNOSIS — Z510 Encounter for antineoplastic radiation therapy: Secondary | ICD-10-CM | POA: Diagnosis not present

## 2015-10-05 DIAGNOSIS — C50412 Malignant neoplasm of upper-outer quadrant of left female breast: Secondary | ICD-10-CM | POA: Diagnosis not present

## 2015-10-08 ENCOUNTER — Ambulatory Visit
Admission: RE | Admit: 2015-10-08 | Discharge: 2015-10-08 | Disposition: A | Discharge status: Home / Self Care | Payer: Medicare Other | Source: Ambulatory Visit | Attending: Radiation Oncology | Admitting: Radiation Oncology

## 2015-10-08 ENCOUNTER — Encounter: Payer: Self-pay | Admitting: *Deleted

## 2015-10-08 DIAGNOSIS — C50412 Malignant neoplasm of upper-outer quadrant of left female breast: Secondary | ICD-10-CM | POA: Diagnosis not present

## 2015-10-08 DIAGNOSIS — C773 Secondary and unspecified malignant neoplasm of axilla and upper limb lymph nodes: Secondary | ICD-10-CM | POA: Diagnosis not present

## 2015-10-08 DIAGNOSIS — Z510 Encounter for antineoplastic radiation therapy: Secondary | ICD-10-CM | POA: Diagnosis not present

## 2015-10-08 DIAGNOSIS — Z171 Estrogen receptor negative status [ER-]: Secondary | ICD-10-CM | POA: Diagnosis not present

## 2015-10-09 ENCOUNTER — Ambulatory Visit
Admission: RE | Admit: 2015-10-09 | Discharge: 2015-10-09 | Disposition: A | Discharge status: Home / Self Care | Payer: Medicare Other | Source: Ambulatory Visit | Attending: Radiation Oncology | Admitting: Radiation Oncology

## 2015-10-09 VITALS — BP 146/69 | HR 72 | Temp 97.8°F | Resp 16 | Ht 61.0 in | Wt 131.4 lb

## 2015-10-09 DIAGNOSIS — C50412 Malignant neoplasm of upper-outer quadrant of left female breast: Secondary | ICD-10-CM

## 2015-10-09 DIAGNOSIS — Z510 Encounter for antineoplastic radiation therapy: Secondary | ICD-10-CM | POA: Diagnosis not present

## 2015-10-09 DIAGNOSIS — C773 Secondary and unspecified malignant neoplasm of axilla and upper limb lymph nodes: Secondary | ICD-10-CM | POA: Diagnosis not present

## 2015-10-09 DIAGNOSIS — Z171 Estrogen receptor negative status [ER-]: Secondary | ICD-10-CM | POA: Diagnosis not present

## 2015-10-09 NOTE — Progress Notes (Signed)
  Radiation Oncology         [phone] ________________________________  Name: Carla Bennett MRN: [id number]  Date: 10/09/2015  DOB: 07-18-1935  Weekly Radiation Therapy Management    ICD-9-CM ICD-10-CM   1. Breast cancer of upper-outer quadrant of left female breast ([facility]) 174.4 C50.412      Current Dose: 12.6 Gy     Planned Dose:  60.4 Gy  Narrative . . . . . . . . The patient presents for routine under treatment assessment.                                   The patient is without complaint. No significant fatigue.                                 Set-up films were reviewed.                                 The chart was checked. Physical Findings. . .  height is 5\' 1"  (1.549 m) and weight is 131 lb 6.4 oz (59.603 kg). Her oral temperature is 97.8 F (36.6 C). Her blood pressure is 146/69 and her pulse is 72. Her respiration is 16. . Weight essentially stable.  No significant changes. Some mild erythema to the skin of the treatment area. Lungs are clear. The heart has a regular rhythm and rate. Impression . . . . . . . The patient is tolerating radiation. Plan . . . . . . . . . . . . Continue treatment as planned.  ________________________________   Blair Promise, PhD, MD

## 2015-10-09 NOTE — Progress Notes (Signed)
Carla Bennett has completed 7 fractions to her left breast and subclavian area.  She denies pain but does report numbness and tingling in her feet.  She reports her energy level is improving.  The skin on her left subclavian area and left breast is intact.  She is using radiaplex.  BP 146/69 mmHg  Pulse 72  Temp(Src) 97.8 F (36.6 C) (Oral)  Resp 16  Ht 5\' 1"  (1.549 m)  Wt 131 lb 6.4 oz (59.603 kg)  BMI 24.84 kg/m2  LMP 10/20/1996

## 2015-10-10 ENCOUNTER — Encounter: Payer: Self-pay | Admitting: *Deleted

## 2015-10-10 ENCOUNTER — Ambulatory Visit
Admission: RE | Admit: 2015-10-10 | Discharge: 2015-10-10 | Disposition: A | Discharge status: Home / Self Care | Payer: Medicare Other | Source: Ambulatory Visit | Attending: Radiation Oncology | Admitting: Radiation Oncology

## 2015-10-10 DIAGNOSIS — C773 Secondary and unspecified malignant neoplasm of axilla and upper limb lymph nodes: Secondary | ICD-10-CM | POA: Diagnosis not present

## 2015-10-10 DIAGNOSIS — C50412 Malignant neoplasm of upper-outer quadrant of left female breast: Secondary | ICD-10-CM | POA: Diagnosis not present

## 2015-10-10 DIAGNOSIS — Z171 Estrogen receptor negative status [ER-]: Secondary | ICD-10-CM | POA: Diagnosis not present

## 2015-10-10 DIAGNOSIS — Z510 Encounter for antineoplastic radiation therapy: Secondary | ICD-10-CM | POA: Diagnosis not present

## 2015-10-11 ENCOUNTER — Ambulatory Visit
Admission: RE | Admit: 2015-10-11 | Discharge: 2015-10-11 | Disposition: A | Discharge status: Home / Self Care | Payer: Medicare Other | Source: Ambulatory Visit | Attending: Radiation Oncology | Admitting: Radiation Oncology

## 2015-10-11 DIAGNOSIS — Z171 Estrogen receptor negative status [ER-]: Secondary | ICD-10-CM | POA: Diagnosis not present

## 2015-10-11 DIAGNOSIS — C50412 Malignant neoplasm of upper-outer quadrant of left female breast: Secondary | ICD-10-CM | POA: Diagnosis not present

## 2015-10-11 DIAGNOSIS — C773 Secondary and unspecified malignant neoplasm of axilla and upper limb lymph nodes: Secondary | ICD-10-CM | POA: Diagnosis not present

## 2015-10-11 DIAGNOSIS — Z510 Encounter for antineoplastic radiation therapy: Secondary | ICD-10-CM | POA: Diagnosis not present

## 2015-10-12 ENCOUNTER — Ambulatory Visit
Admission: RE | Admit: 2015-10-12 | Discharge: 2015-10-12 | Disposition: A | Discharge status: Home / Self Care | Payer: Medicare Other | Source: Ambulatory Visit | Attending: Radiation Oncology | Admitting: Radiation Oncology

## 2015-10-12 DIAGNOSIS — C50412 Malignant neoplasm of upper-outer quadrant of left female breast: Secondary | ICD-10-CM | POA: Diagnosis not present

## 2015-10-12 DIAGNOSIS — C773 Secondary and unspecified malignant neoplasm of axilla and upper limb lymph nodes: Secondary | ICD-10-CM | POA: Diagnosis not present

## 2015-10-12 DIAGNOSIS — Z171 Estrogen receptor negative status [ER-]: Secondary | ICD-10-CM | POA: Diagnosis not present

## 2015-10-12 DIAGNOSIS — Z510 Encounter for antineoplastic radiation therapy: Secondary | ICD-10-CM | POA: Diagnosis not present

## 2015-10-16 ENCOUNTER — Ambulatory Visit
Admission: RE | Admit: 2015-10-16 | Discharge: 2015-10-16 | Disposition: A | Discharge status: Home / Self Care | Payer: Medicare Other | Source: Ambulatory Visit | Attending: Radiation Oncology | Admitting: Radiation Oncology

## 2015-10-16 ENCOUNTER — Encounter: Payer: Self-pay | Admitting: Radiation Oncology

## 2015-10-16 VITALS — BP 143/67 | HR 72 | Temp 98.0°F | Resp 20 | Wt 133.0 lb

## 2015-10-16 DIAGNOSIS — Z510 Encounter for antineoplastic radiation therapy: Secondary | ICD-10-CM | POA: Diagnosis not present

## 2015-10-16 DIAGNOSIS — C50412 Malignant neoplasm of upper-outer quadrant of left female breast: Secondary | ICD-10-CM

## 2015-10-16 DIAGNOSIS — Z171 Estrogen receptor negative status [ER-]: Secondary | ICD-10-CM | POA: Diagnosis not present

## 2015-10-16 DIAGNOSIS — C773 Secondary and unspecified malignant neoplasm of axilla and upper limb lymph nodes: Secondary | ICD-10-CM | POA: Diagnosis not present

## 2015-10-16 NOTE — Progress Notes (Signed)
Weekly Management Note Current Dose:  19.8 Gy  Projected Dose: 60.4 Gy   Narrative:  The patient presents for routine under treatment assessment.  CBCT/MVCT images/Port film x-rays were reviewed.  The chart was checked.  She has completed 11 treatments. She has no skin changes and is using radipalex bid. She has no complaints of pain. Hass a good appetite and energy level.  Physical Findings: Weight: 133 lb (60.328 kg). No skin changes noted.  Impression:  The patient is tolerating radiation.  Plan:  Continue treatment as planned.  This document serves as a record of services personally performed by Thea Silversmith, MD. It was created on her behalf by Darcus Austin, a trained medical scribe. The creation of this record is based on the scribe's personal observations and the provider's statements to them. This document has been checked and approved by the attending provider.

## 2015-10-16 NOTE — Progress Notes (Signed)
weekly rad tx left breast 11 completed, no skin changes, using radipalex bid, no pain, appetite good, energy level pretty good, no pain BP 143/67 mmHg  Pulse 72  Temp(Src) 98 F (36.7 C) (Oral)  Resp 20  Wt 133 lb (60.328 kg)  LMP 10/20/1996  Wt Readings from Last 3 Encounters:  10/16/15 133 lb (60.328 kg)  10/09/15 131 lb 6.4 oz (59.603 kg)  10/02/15 131 lb 9.6 oz (59.693 kg)

## 2015-10-17 ENCOUNTER — Ambulatory Visit
Admission: RE | Admit: 2015-10-17 | Discharge: 2015-10-17 | Disposition: A | Discharge status: Home / Self Care | Payer: Medicare Other | Source: Ambulatory Visit | Attending: Radiation Oncology | Admitting: Radiation Oncology

## 2015-10-17 DIAGNOSIS — C773 Secondary and unspecified malignant neoplasm of axilla and upper limb lymph nodes: Secondary | ICD-10-CM | POA: Diagnosis not present

## 2015-10-17 DIAGNOSIS — Z510 Encounter for antineoplastic radiation therapy: Secondary | ICD-10-CM | POA: Diagnosis not present

## 2015-10-17 DIAGNOSIS — Z171 Estrogen receptor negative status [ER-]: Secondary | ICD-10-CM | POA: Diagnosis not present

## 2015-10-17 DIAGNOSIS — C50412 Malignant neoplasm of upper-outer quadrant of left female breast: Secondary | ICD-10-CM | POA: Diagnosis not present

## 2015-10-18 ENCOUNTER — Ambulatory Visit
Admission: RE | Admit: 2015-10-18 | Discharge: 2015-10-18 | Disposition: A | Discharge status: Home / Self Care | Payer: Medicare Other | Source: Ambulatory Visit | Attending: Radiation Oncology | Admitting: Radiation Oncology

## 2015-10-18 DIAGNOSIS — C50412 Malignant neoplasm of upper-outer quadrant of left female breast: Secondary | ICD-10-CM | POA: Diagnosis not present

## 2015-10-18 DIAGNOSIS — C773 Secondary and unspecified malignant neoplasm of axilla and upper limb lymph nodes: Secondary | ICD-10-CM | POA: Diagnosis not present

## 2015-10-18 DIAGNOSIS — Z171 Estrogen receptor negative status [ER-]: Secondary | ICD-10-CM | POA: Diagnosis not present

## 2015-10-18 DIAGNOSIS — Z510 Encounter for antineoplastic radiation therapy: Secondary | ICD-10-CM | POA: Diagnosis not present

## 2015-10-19 ENCOUNTER — Ambulatory Visit
Admission: RE | Admit: 2015-10-19 | Discharge: 2015-10-19 | Disposition: A | Discharge status: Home / Self Care | Payer: Medicare Other | Source: Ambulatory Visit | Attending: Radiation Oncology | Admitting: Radiation Oncology

## 2015-10-19 DIAGNOSIS — Z510 Encounter for antineoplastic radiation therapy: Secondary | ICD-10-CM | POA: Diagnosis not present

## 2015-10-19 DIAGNOSIS — C50412 Malignant neoplasm of upper-outer quadrant of left female breast: Secondary | ICD-10-CM | POA: Diagnosis not present

## 2015-10-19 DIAGNOSIS — C773 Secondary and unspecified malignant neoplasm of axilla and upper limb lymph nodes: Secondary | ICD-10-CM | POA: Diagnosis not present

## 2015-10-19 DIAGNOSIS — Z171 Estrogen receptor negative status [ER-]: Secondary | ICD-10-CM | POA: Diagnosis not present

## 2015-10-23 ENCOUNTER — Ambulatory Visit
Admission: RE | Admit: 2015-10-23 | Discharge: 2015-10-23 | Disposition: A | Discharge status: Home / Self Care | Payer: Medicare Other | Source: Ambulatory Visit | Attending: Radiation Oncology | Admitting: Radiation Oncology

## 2015-10-23 ENCOUNTER — Encounter: Payer: Self-pay | Admitting: Radiation Oncology

## 2015-10-23 VITALS — BP 158/74 | HR 76 | Temp 97.9°F | Resp 18 | Ht 61.0 in | Wt 134.1 lb

## 2015-10-23 DIAGNOSIS — Z171 Estrogen receptor negative status [ER-]: Secondary | ICD-10-CM | POA: Diagnosis not present

## 2015-10-23 DIAGNOSIS — C50412 Malignant neoplasm of upper-outer quadrant of left female breast: Secondary | ICD-10-CM | POA: Diagnosis not present

## 2015-10-23 DIAGNOSIS — C773 Secondary and unspecified malignant neoplasm of axilla and upper limb lymph nodes: Secondary | ICD-10-CM | POA: Diagnosis not present

## 2015-10-23 DIAGNOSIS — Z510 Encounter for antineoplastic radiation therapy: Secondary | ICD-10-CM | POA: Diagnosis not present

## 2015-10-23 NOTE — Progress Notes (Signed)
Carla Bennett has completed 15 fractions to her left breast and subclavian area.  She denies pain.  She reports having fatigue with activity.  The skin on her left breast is pink.  The skin on her left subclavian area and upper back has hyperpigmentation.  She is using radiaplex.  BP 158/74 mmHg  Pulse 76  Temp(Src) 97.9 F (36.6 C) (Oral)  Resp 18  Ht 5\' 1"  (1.549 m)  Wt 134 lb 1.6 oz (60.827 kg)  BMI 25.35 kg/m2  LMP 10/20/1996

## 2015-10-23 NOTE — Progress Notes (Signed)
  Radiation Oncology         [phone] ________________________________  Name: Carla Bennett MRN: [id number]  Date: 10/23/2015  DOB: [birth year]  Weekly Radiation Therapy Management    ICD-9-CM ICD-10-CM   1. Breast cancer of upper-outer quadrant of left female breast ([facility]) 174.4 C50.412      Current Dose: 27 Gy     Planned Dose:  60.4 Gy  Narrative . . . . . . . . The patient presents for routine under treatment assessment.                                   The patient is without complaint.                                 Set-up films were reviewed.                                 The chart was checked. Physical Findings. . .  height is 5\' 1"  (1.549 m) and weight is 134 lb 1.6 oz (60.827 kg). Her oral temperature is 97.9 F (36.6 C). Her blood pressure is 158/74 and her pulse is 76. Her respiration is 18. . Weight essentially stable.  No significant changes. Minimal hyperpigmentation changes and erythema within the breast Impression . . . . . . . The patient is tolerating radiation. Plan . . . . . . . . . . . . Continue treatment as planned.  ________________________________   Blair Promise, PhD, MD

## 2015-10-24 ENCOUNTER — Ambulatory Visit
Admission: RE | Admit: 2015-10-24 | Discharge: 2015-10-24 | Disposition: A | Discharge status: Home / Self Care | Payer: Medicare Other | Source: Ambulatory Visit | Attending: Radiation Oncology | Admitting: Radiation Oncology

## 2015-10-24 DIAGNOSIS — C773 Secondary and unspecified malignant neoplasm of axilla and upper limb lymph nodes: Secondary | ICD-10-CM | POA: Diagnosis not present

## 2015-10-24 DIAGNOSIS — Z171 Estrogen receptor negative status [ER-]: Secondary | ICD-10-CM | POA: Diagnosis not present

## 2015-10-24 DIAGNOSIS — C50412 Malignant neoplasm of upper-outer quadrant of left female breast: Secondary | ICD-10-CM | POA: Diagnosis not present

## 2015-10-24 DIAGNOSIS — Z510 Encounter for antineoplastic radiation therapy: Secondary | ICD-10-CM | POA: Diagnosis not present

## 2015-10-25 ENCOUNTER — Ambulatory Visit
Admission: RE | Admit: 2015-10-25 | Discharge: 2015-10-25 | Disposition: A | Discharge status: Home / Self Care | Payer: Medicare Other | Source: Ambulatory Visit | Attending: Radiation Oncology | Admitting: Radiation Oncology

## 2015-10-25 DIAGNOSIS — Z510 Encounter for antineoplastic radiation therapy: Secondary | ICD-10-CM | POA: Diagnosis not present

## 2015-10-25 DIAGNOSIS — C773 Secondary and unspecified malignant neoplasm of axilla and upper limb lymph nodes: Secondary | ICD-10-CM | POA: Diagnosis not present

## 2015-10-25 DIAGNOSIS — Z171 Estrogen receptor negative status [ER-]: Secondary | ICD-10-CM | POA: Diagnosis not present

## 2015-10-25 DIAGNOSIS — C50412 Malignant neoplasm of upper-outer quadrant of left female breast: Secondary | ICD-10-CM | POA: Diagnosis not present

## 2015-10-26 ENCOUNTER — Ambulatory Visit
Admission: RE | Admit: 2015-10-26 | Discharge: 2015-10-26 | Disposition: A | Discharge status: Home / Self Care | Payer: Medicare Other | Source: Ambulatory Visit | Attending: Radiation Oncology | Admitting: Radiation Oncology

## 2015-10-26 DIAGNOSIS — Z510 Encounter for antineoplastic radiation therapy: Secondary | ICD-10-CM | POA: Diagnosis not present

## 2015-10-26 DIAGNOSIS — C773 Secondary and unspecified malignant neoplasm of axilla and upper limb lymph nodes: Secondary | ICD-10-CM | POA: Diagnosis not present

## 2015-10-26 DIAGNOSIS — Z171 Estrogen receptor negative status [ER-]: Secondary | ICD-10-CM | POA: Diagnosis not present

## 2015-10-26 DIAGNOSIS — C50412 Malignant neoplasm of upper-outer quadrant of left female breast: Secondary | ICD-10-CM | POA: Diagnosis not present

## 2015-10-29 ENCOUNTER — Ambulatory Visit
Admission: RE | Admit: 2015-10-29 | Discharge: 2015-10-29 | Disposition: A | Discharge status: Home / Self Care | Payer: Medicare Other | Source: Ambulatory Visit | Attending: Radiation Oncology | Admitting: Radiation Oncology

## 2015-10-29 DIAGNOSIS — Z510 Encounter for antineoplastic radiation therapy: Secondary | ICD-10-CM | POA: Diagnosis not present

## 2015-10-29 DIAGNOSIS — C50412 Malignant neoplasm of upper-outer quadrant of left female breast: Secondary | ICD-10-CM | POA: Diagnosis not present

## 2015-10-29 DIAGNOSIS — C773 Secondary and unspecified malignant neoplasm of axilla and upper limb lymph nodes: Secondary | ICD-10-CM | POA: Diagnosis not present

## 2015-10-29 DIAGNOSIS — Z171 Estrogen receptor negative status [ER-]: Secondary | ICD-10-CM | POA: Diagnosis not present

## 2015-10-30 ENCOUNTER — Ambulatory Visit
Admission: RE | Admit: 2015-10-30 | Discharge: 2015-10-30 | Disposition: A | Discharge status: Home / Self Care | Payer: Medicare Other | Source: Ambulatory Visit | Attending: Radiation Oncology | Admitting: Radiation Oncology

## 2015-10-30 ENCOUNTER — Other Ambulatory Visit: Payer: Self-pay

## 2015-10-30 ENCOUNTER — Encounter: Payer: Self-pay | Admitting: Radiation Oncology

## 2015-10-30 VITALS — BP 174/86 | HR 79 | Temp 97.8°F | Resp 16 | Ht 61.0 in | Wt 133.8 lb

## 2015-10-30 DIAGNOSIS — C50412 Malignant neoplasm of upper-outer quadrant of left female breast: Secondary | ICD-10-CM | POA: Insufficient documentation

## 2015-10-30 DIAGNOSIS — Z510 Encounter for antineoplastic radiation therapy: Secondary | ICD-10-CM | POA: Diagnosis not present

## 2015-10-30 DIAGNOSIS — C773 Secondary and unspecified malignant neoplasm of axilla and upper limb lymph nodes: Secondary | ICD-10-CM | POA: Diagnosis not present

## 2015-10-30 DIAGNOSIS — Z171 Estrogen receptor negative status [ER-]: Secondary | ICD-10-CM | POA: Diagnosis not present

## 2015-10-30 MED ORDER — RADIAPLEXRX EX GEL
Freq: Once | CUTANEOUS | Status: AC
  Administered 2015-10-30: 12:00:00 via TOPICAL

## 2015-10-30 NOTE — Progress Notes (Signed)
Carla Bennett has completed 20 fractions to her left breast and subclavian area.  She reports pain in her upper right shoulder area that started 2 weeks ago.  She said it is keeping her from sleeping at night and she is not sure what pain medication she can take due to having kidney failure.  The skin on her left subclavian area and breast is red.  She is using radiaplex and has requested a refill.  Another tube has been given.  BP 174/86 mmHg  Pulse 79  Temp(Src) 97.8 F (36.6 C) (Oral)  Resp 16  Ht 5\' 1"  (1.549 m)  Wt 133 lb 12.8 oz (60.691 kg)  BMI 25.29 kg/m2  LMP 10/20/1996

## 2015-10-30 NOTE — Progress Notes (Signed)
  Radiation Oncology         (336) [phone] ________________________________  Name: Carla Bennett MRN: [id number]  Date: 10/30/2015  DOB: 03/05/1935  Weekly Radiation Therapy Management    ICD-9-CM ICD-10-CM   1. Breast cancer of upper-outer quadrant of left female breast ([facility]) 174.4 C50.412 hyaluronate sodium (RADIAPLEXRX) gel     Current Dose: 36 Gy     Planned Dose:  60.4 Gy  Narrative . . . . . . . . The patient presents for routine under treatment assessment.                                   The patient is without complaint except for some pain in the right shoulder region. She will consult her nephrologist concerning pain medications for this issue                                 Set-up films were reviewed.                                 The chart was checked. Physical Findings. . .  height is 5\' 1"  (1.549 m) and weight is 133 lb 12.8 oz (60.691 kg). Her oral temperature is 97.8 F (36.6 C). Her blood pressure is 174/86 and her pulse is 79. Her respiration is 16. . The lungs are clear. The heart has a regular rhythm and rate. The left breast area shows some erythema in the supraclavicular region otherwise minimal skin reaction Impression . . . . . . . The patient is tolerating radiation. Plan . . . . . . . . . . . . Continue treatment as planned.  ________________________________   Blair Promise, PhD, MD

## 2015-10-31 ENCOUNTER — Ambulatory Visit
Admission: RE | Admit: 2015-10-31 | Discharge: 2015-10-31 | Disposition: A | Discharge status: Home / Self Care | Payer: Medicare Other | Source: Ambulatory Visit | Attending: Radiation Oncology | Admitting: Radiation Oncology

## 2015-10-31 ENCOUNTER — Telehealth: Payer: Self-pay | Admitting: *Deleted

## 2015-10-31 DIAGNOSIS — Z510 Encounter for antineoplastic radiation therapy: Secondary | ICD-10-CM | POA: Diagnosis not present

## 2015-10-31 DIAGNOSIS — C50412 Malignant neoplasm of upper-outer quadrant of left female breast: Secondary | ICD-10-CM | POA: Diagnosis not present

## 2015-10-31 DIAGNOSIS — C773 Secondary and unspecified malignant neoplasm of axilla and upper limb lymph nodes: Secondary | ICD-10-CM | POA: Diagnosis not present

## 2015-10-31 DIAGNOSIS — Z171 Estrogen receptor negative status [ER-]: Secondary | ICD-10-CM | POA: Diagnosis not present

## 2015-10-31 NOTE — Telephone Encounter (Signed)
Per POF and desk RN I have scheduled appt on 1/27. Patient called and aware, will pick up calendar tomorrow

## 2015-11-01 ENCOUNTER — Ambulatory Visit
Admission: RE | Admit: 2015-11-01 | Discharge: 2015-11-01 | Disposition: A | Discharge status: Home / Self Care | Payer: Medicare Other | Source: Ambulatory Visit | Attending: Radiation Oncology | Admitting: Radiation Oncology

## 2015-11-01 DIAGNOSIS — C773 Secondary and unspecified malignant neoplasm of axilla and upper limb lymph nodes: Secondary | ICD-10-CM | POA: Diagnosis not present

## 2015-11-01 DIAGNOSIS — C50412 Malignant neoplasm of upper-outer quadrant of left female breast: Secondary | ICD-10-CM | POA: Diagnosis not present

## 2015-11-01 DIAGNOSIS — Z171 Estrogen receptor negative status [ER-]: Secondary | ICD-10-CM | POA: Diagnosis not present

## 2015-11-01 DIAGNOSIS — Z510 Encounter for antineoplastic radiation therapy: Secondary | ICD-10-CM | POA: Diagnosis not present

## 2015-11-02 ENCOUNTER — Ambulatory Visit
Admission: RE | Admit: 2015-11-02 | Discharge: 2015-11-02 | Disposition: A | Discharge status: Home / Self Care | Payer: Medicare Other | Source: Ambulatory Visit | Attending: Radiation Oncology | Admitting: Radiation Oncology

## 2015-11-02 DIAGNOSIS — C773 Secondary and unspecified malignant neoplasm of axilla and upper limb lymph nodes: Secondary | ICD-10-CM | POA: Diagnosis not present

## 2015-11-02 DIAGNOSIS — Z510 Encounter for antineoplastic radiation therapy: Secondary | ICD-10-CM | POA: Diagnosis not present

## 2015-11-02 DIAGNOSIS — C50412 Malignant neoplasm of upper-outer quadrant of left female breast: Secondary | ICD-10-CM | POA: Diagnosis not present

## 2015-11-02 DIAGNOSIS — Z171 Estrogen receptor negative status [ER-]: Secondary | ICD-10-CM | POA: Diagnosis not present

## 2015-11-05 ENCOUNTER — Ambulatory Visit
Admission: RE | Admit: 2015-11-05 | Discharge: 2015-11-05 | Disposition: A | Discharge status: Home / Self Care | Payer: Medicare Other | Source: Ambulatory Visit | Attending: Radiation Oncology | Admitting: Radiation Oncology

## 2015-11-05 DIAGNOSIS — C773 Secondary and unspecified malignant neoplasm of axilla and upper limb lymph nodes: Secondary | ICD-10-CM | POA: Diagnosis not present

## 2015-11-05 DIAGNOSIS — Z510 Encounter for antineoplastic radiation therapy: Secondary | ICD-10-CM | POA: Diagnosis not present

## 2015-11-05 DIAGNOSIS — C50412 Malignant neoplasm of upper-outer quadrant of left female breast: Secondary | ICD-10-CM | POA: Diagnosis not present

## 2015-11-05 DIAGNOSIS — Z171 Estrogen receptor negative status [ER-]: Secondary | ICD-10-CM | POA: Diagnosis not present

## 2015-11-06 ENCOUNTER — Ambulatory Visit
Admission: RE | Admit: 2015-11-06 | Discharge: 2015-11-06 | Disposition: A | Discharge status: Home / Self Care | Payer: Medicare Other | Source: Ambulatory Visit | Attending: Radiation Oncology | Admitting: Radiation Oncology

## 2015-11-06 ENCOUNTER — Ambulatory Visit: Payer: Medicare Other | Admitting: Radiation Oncology

## 2015-11-06 ENCOUNTER — Encounter: Payer: Self-pay | Admitting: Radiation Oncology

## 2015-11-06 VITALS — BP 133/64 | HR 75 | Temp 97.8°F | Resp 16 | Wt 133.5 lb

## 2015-11-06 DIAGNOSIS — Z510 Encounter for antineoplastic radiation therapy: Secondary | ICD-10-CM | POA: Diagnosis not present

## 2015-11-06 DIAGNOSIS — C50412 Malignant neoplasm of upper-outer quadrant of left female breast: Secondary | ICD-10-CM | POA: Diagnosis not present

## 2015-11-06 DIAGNOSIS — C773 Secondary and unspecified malignant neoplasm of axilla and upper limb lymph nodes: Secondary | ICD-10-CM | POA: Diagnosis not present

## 2015-11-06 DIAGNOSIS — Z171 Estrogen receptor negative status [ER-]: Secondary | ICD-10-CM | POA: Diagnosis not present

## 2015-11-06 MED ORDER — SONAFINE EX EMUL
1.0000 "application " | Freq: Two times a day (BID) | CUTANEOUS | Status: DC
  Administered 2015-11-06: 1 via TOPICAL
  Filled 2015-11-06: qty 45

## 2015-11-06 NOTE — Progress Notes (Signed)
  Radiation Oncology         [phone] ________________________________  Name: Carla Bennett MRN: [id number]  Date: 11/06/2015  DOB: [birth year]  Weekly Radiation Therapy Management    ICD-9-CM ICD-10-CM   1. Breast cancer of upper-outer quadrant of left female breast ([facility]) 174.4 C50.412 SONAFINE emulsion 1 application     Current Dose: 45 Gy     Planned Dose:  60.4 Gy  Narrative . . . . . . . . The patient presents for routine under treatment assessment.                                   The patient is without complaint. Some itching noted however.                                 Set-up films were reviewed.                                 The chart was checked. Physical Findings. . .  weight is 133 lb 8 oz (60.555 kg). Her oral temperature is 97.8 F (36.6 C). Her blood pressure is 133/64 and her pulse is 75. Her respiration is 16. . The lungs are clear. The heart has a regular rhythm and rate. The skin of the left breast shows some moderate erythema. No skin breakdown. Impression . . . . . . . The patient is tolerating radiation. Plan . . . . . . . . . . . . Continue treatment as planned. sonafine given to pt.  ________________________________   Blair Promise, PhD, MD

## 2015-11-06 NOTE — Progress Notes (Signed)
Weekly rad txs  Has had 24 txs so far to left breast, her before tx,machine down, erythema, on breast and on top of left back, c/o itching, skin intact, using radaiplex bid, appetite good, no pain 10:26 AM BP 133/64 mmHg  Pulse 75  Temp(Src) 97.8 F (36.6 C) (Oral)  Resp 16  Wt 133 lb 8 oz (60.555 kg)  LMP 10/20/1996  Wt Readings from Last 3 Encounters:  11/06/15 133 lb 8 oz (60.555 kg)  10/30/15 133 lb 12.8 oz (60.691 kg)  10/23/15 134 lb 1.6 oz (60.827 kg)

## 2015-11-07 ENCOUNTER — Encounter: Payer: Self-pay | Admitting: Radiation Oncology

## 2015-11-07 ENCOUNTER — Ambulatory Visit
Admission: RE | Admit: 2015-11-07 | Discharge: 2015-11-07 | Disposition: A | Discharge status: Home / Self Care | Payer: Medicare Other | Source: Ambulatory Visit | Attending: Radiation Oncology | Admitting: Radiation Oncology

## 2015-11-07 DIAGNOSIS — Z510 Encounter for antineoplastic radiation therapy: Secondary | ICD-10-CM | POA: Diagnosis not present

## 2015-11-07 DIAGNOSIS — Z171 Estrogen receptor negative status [ER-]: Secondary | ICD-10-CM | POA: Diagnosis not present

## 2015-11-07 DIAGNOSIS — C773 Secondary and unspecified malignant neoplasm of axilla and upper limb lymph nodes: Secondary | ICD-10-CM | POA: Diagnosis not present

## 2015-11-07 DIAGNOSIS — C50412 Malignant neoplasm of upper-outer quadrant of left female breast: Secondary | ICD-10-CM | POA: Diagnosis not present

## 2015-11-07 NOTE — Progress Notes (Signed)
  Radiation Oncology         [phone] ________________________________  Name: Carla Bennett MRN: [id number]  Date: 11/07/2015  DOB: [birth year]   Electron beam Simulation  Note  Diagnosis: Left breast cancer  Status: outpatient  NARRATIVE: Earlier today the patient underwent additional planning for radiation therapy directed at the left breast area. The patient's treatment planning CT scan was reviewed and she had set up of a custom electron cutout field directed at the lumpectomy cavity within the left breast. Patient will be treated with combination of 15 and 18 MeV electrons. [employer] plan was generated for treatment. Plan is for the patient received 5 additional treatments at 2 gray per fraction for a boost dose of 10 gray. A special port plan is requested for treatment.  -----------------------------------  Blair Promise, PhD, MD

## 2015-11-08 ENCOUNTER — Ambulatory Visit
Admission: RE | Admit: 2015-11-08 | Discharge: 2015-11-08 | Disposition: A | Discharge status: Home / Self Care | Payer: Medicare Other | Source: Ambulatory Visit | Attending: Radiation Oncology | Admitting: Radiation Oncology

## 2015-11-08 ENCOUNTER — Ambulatory Visit: Admission: RE | Admit: 2015-11-08 | Payer: Medicare Other | Source: Ambulatory Visit | Admitting: Radiation Oncology

## 2015-11-08 DIAGNOSIS — C773 Secondary and unspecified malignant neoplasm of axilla and upper limb lymph nodes: Secondary | ICD-10-CM | POA: Diagnosis not present

## 2015-11-08 DIAGNOSIS — Z171 Estrogen receptor negative status [ER-]: Secondary | ICD-10-CM | POA: Diagnosis not present

## 2015-11-08 DIAGNOSIS — Z510 Encounter for antineoplastic radiation therapy: Secondary | ICD-10-CM | POA: Diagnosis not present

## 2015-11-08 DIAGNOSIS — C50412 Malignant neoplasm of upper-outer quadrant of left female breast: Secondary | ICD-10-CM | POA: Diagnosis not present

## 2015-11-09 ENCOUNTER — Ambulatory Visit
Admission: RE | Admit: 2015-11-09 | Discharge: 2015-11-09 | Disposition: A | Discharge status: Home / Self Care | Payer: Medicare Other | Source: Ambulatory Visit | Attending: Radiation Oncology | Admitting: Radiation Oncology

## 2015-11-09 ENCOUNTER — Ambulatory Visit: Payer: Medicare Other

## 2015-11-09 DIAGNOSIS — C50412 Malignant neoplasm of upper-outer quadrant of left female breast: Secondary | ICD-10-CM | POA: Diagnosis not present

## 2015-11-09 DIAGNOSIS — Z510 Encounter for antineoplastic radiation therapy: Secondary | ICD-10-CM | POA: Diagnosis not present

## 2015-11-09 DIAGNOSIS — Z171 Estrogen receptor negative status [ER-]: Secondary | ICD-10-CM | POA: Diagnosis not present

## 2015-11-09 DIAGNOSIS — C773 Secondary and unspecified malignant neoplasm of axilla and upper limb lymph nodes: Secondary | ICD-10-CM | POA: Diagnosis not present

## 2015-11-12 ENCOUNTER — Ambulatory Visit
Admission: RE | Admit: 2015-11-12 | Discharge: 2015-11-12 | Disposition: A | Discharge status: Home / Self Care | Payer: Medicare Other | Source: Ambulatory Visit | Attending: Radiation Oncology | Admitting: Radiation Oncology

## 2015-11-12 DIAGNOSIS — C773 Secondary and unspecified malignant neoplasm of axilla and upper limb lymph nodes: Secondary | ICD-10-CM | POA: Diagnosis not present

## 2015-11-12 DIAGNOSIS — Z510 Encounter for antineoplastic radiation therapy: Secondary | ICD-10-CM | POA: Diagnosis not present

## 2015-11-12 DIAGNOSIS — Z171 Estrogen receptor negative status [ER-]: Secondary | ICD-10-CM | POA: Diagnosis not present

## 2015-11-12 DIAGNOSIS — C50412 Malignant neoplasm of upper-outer quadrant of left female breast: Secondary | ICD-10-CM | POA: Diagnosis not present

## 2015-11-13 ENCOUNTER — Ambulatory Visit
Admission: RE | Admit: 2015-11-13 | Discharge: 2015-11-13 | Disposition: A | Discharge status: Home / Self Care | Payer: Medicare Other | Source: Ambulatory Visit | Attending: Radiation Oncology | Admitting: Radiation Oncology

## 2015-11-13 DIAGNOSIS — C50412 Malignant neoplasm of upper-outer quadrant of left female breast: Secondary | ICD-10-CM | POA: Diagnosis not present

## 2015-11-13 DIAGNOSIS — C773 Secondary and unspecified malignant neoplasm of axilla and upper limb lymph nodes: Secondary | ICD-10-CM | POA: Diagnosis not present

## 2015-11-13 DIAGNOSIS — Z171 Estrogen receptor negative status [ER-]: Secondary | ICD-10-CM | POA: Diagnosis not present

## 2015-11-13 DIAGNOSIS — Z510 Encounter for antineoplastic radiation therapy: Secondary | ICD-10-CM | POA: Diagnosis not present

## 2015-11-13 NOTE — Progress Notes (Signed)
  Radiation Oncology         [phone] ________________________________  Name: Carla Bennett MRN: [id number]  Date: 11/13/2015  DOB: 12/10/1934  Weekly Radiation Therapy Management    ICD-9-CM ICD-10-CM   1. Breast cancer of upper-outer quadrant of left female breast (HCC) 174.4 C50.412      Current Dose: 54.4 Gy     Planned Dose:  60.4 Gy  Narrative . . . . . . . . The patient presents for routine under treatment assessment.                                   The patient is without complaint. Minimal fatigue and itching/discomfort within the breast.                                 Set-up films were reviewed.                                 The chart was checked. Physical Findings. . . The left breast area shows hyperpigmentation changes and mild erythema. No skin breakdown Impression . . . . . . . The patient is tolerating radiation. Plan . . . . . . . . . . . . Continue treatment as planned.  ________________________________   Blair Promise, PhD, MD

## 2015-11-14 ENCOUNTER — Ambulatory Visit
Admission: RE | Admit: 2015-11-14 | Discharge: 2015-11-14 | Disposition: A | Discharge status: Home / Self Care | Payer: Medicare Other | Source: Ambulatory Visit | Attending: Radiation Oncology | Admitting: Radiation Oncology

## 2015-11-14 DIAGNOSIS — Z510 Encounter for antineoplastic radiation therapy: Secondary | ICD-10-CM | POA: Diagnosis not present

## 2015-11-14 DIAGNOSIS — C773 Secondary and unspecified malignant neoplasm of axilla and upper limb lymph nodes: Secondary | ICD-10-CM | POA: Diagnosis not present

## 2015-11-14 DIAGNOSIS — Z171 Estrogen receptor negative status [ER-]: Secondary | ICD-10-CM | POA: Diagnosis not present

## 2015-11-14 DIAGNOSIS — C50412 Malignant neoplasm of upper-outer quadrant of left female breast: Secondary | ICD-10-CM | POA: Diagnosis not present

## 2015-11-15 ENCOUNTER — Encounter: Payer: Self-pay | Admitting: Radiation Oncology

## 2015-11-15 ENCOUNTER — Ambulatory Visit
Admission: RE | Admit: 2015-11-15 | Discharge: 2015-11-15 | Disposition: A | Discharge status: Home / Self Care | Payer: Medicare Other | Source: Ambulatory Visit | Attending: Radiation Oncology | Admitting: Radiation Oncology

## 2015-11-15 VITALS — BP 153/81 | HR 76 | Temp 98.2°F | Resp 16 | Ht 61.0 in | Wt 134.9 lb

## 2015-11-15 DIAGNOSIS — C50412 Malignant neoplasm of upper-outer quadrant of left female breast: Secondary | ICD-10-CM

## 2015-11-15 DIAGNOSIS — C773 Secondary and unspecified malignant neoplasm of axilla and upper limb lymph nodes: Secondary | ICD-10-CM | POA: Diagnosis not present

## 2015-11-15 DIAGNOSIS — Z510 Encounter for antineoplastic radiation therapy: Secondary | ICD-10-CM | POA: Diagnosis not present

## 2015-11-15 DIAGNOSIS — Z171 Estrogen receptor negative status [ER-]: Secondary | ICD-10-CM | POA: Diagnosis not present

## 2015-11-15 NOTE — Progress Notes (Signed)
Patient was given the survivorship pamphlet to review and was advised that a Survivorship NP will be contacting her for an appointment.Marland Kitchen

## 2015-11-15 NOTE — Progress Notes (Signed)
Weekly Management Note Current Dose: 58.40  Gy  Projected Dose: 60.4 Gy   Narrative:  The patient presents for routine under treatment assessment.  CBCT/MVCT images/Port film x-rays were reviewed.  The chart was checked. She is doing well. Her skin is darker and she has some itching and peeling in the inframammary fold and SCLV fossa.  She is using neosporin in the inframammary fold and sonafine.   Physical Findings: Weight: 134 lb 14.4 oz (61.19 kg). hyperpgmented skin. Peeling in the inframammary fold and SCLV.   Impression:  The patient is tolerating radiation.  Plan:  Continue treatment as planned. Continue neosporin and sonafine for 2 weeks then lotion with vitamin e. We discussed follow up in 1 month as well as a survivorship referral.

## 2015-11-15 NOTE — Progress Notes (Signed)
Carla Bennett has completed 32 fractions to her left breast and subclavian area.  She denies pain.  She does report having fatigue.  The skin on her left subclavian area and breast is red.  She has a small amount of peeling underneath her left breast.  She is using sonafine.  Advised her to apply neosporin to the peeling area.  She has been given a one month follow up appointment.  BP 153/81 mmHg  Pulse 76  Temp(Src) 98.2 F (36.8 C) (Oral)  Resp 16  Ht 5\' 1"  (1.549 m)  Wt 134 lb 14.4 oz (61.19 kg)  BMI 25.50 kg/m2  LMP 10/20/1996

## 2015-11-16 ENCOUNTER — Encounter: Payer: Self-pay | Admitting: Radiation Oncology

## 2015-11-16 ENCOUNTER — Encounter: Payer: Self-pay | Admitting: Hematology and Oncology

## 2015-11-16 ENCOUNTER — Telehealth: Payer: Self-pay | Admitting: Hematology and Oncology

## 2015-11-16 ENCOUNTER — Ambulatory Visit
Admission: RE | Admit: 2015-11-16 | Discharge: 2015-11-16 | Disposition: A | Discharge status: Home / Self Care | Payer: Medicare Other | Source: Ambulatory Visit | Attending: Radiation Oncology | Admitting: Radiation Oncology

## 2015-11-16 ENCOUNTER — Ambulatory Visit (HOSPITAL_BASED_OUTPATIENT_CLINIC_OR_DEPARTMENT_OTHER): Payer: Medicare Other | Admitting: Hematology and Oncology

## 2015-11-16 ENCOUNTER — Telehealth: Payer: Self-pay | Admitting: *Deleted

## 2015-11-16 VITALS — BP 144/68 | HR 82 | Temp 97.8°F | Resp 18 | Ht 61.0 in | Wt 132.9 lb

## 2015-11-16 DIAGNOSIS — C50412 Malignant neoplasm of upper-outer quadrant of left female breast: Secondary | ICD-10-CM

## 2015-11-16 DIAGNOSIS — Z171 Estrogen receptor negative status [ER-]: Secondary | ICD-10-CM | POA: Diagnosis not present

## 2015-11-16 DIAGNOSIS — C773 Secondary and unspecified malignant neoplasm of axilla and upper limb lymph nodes: Secondary | ICD-10-CM | POA: Diagnosis not present

## 2015-11-16 DIAGNOSIS — Z510 Encounter for antineoplastic radiation therapy: Secondary | ICD-10-CM | POA: Diagnosis not present

## 2015-11-16 NOTE — Telephone Encounter (Signed)
Appointments made and avs given. °

## 2015-11-16 NOTE — Assessment & Plan Note (Signed)
Left breast invasive ductal carcinoma grade 3 ER 0% PR 0% HER-2 negative Ki-67 75%, 2.9 cm mass with microcalcifications plus abnormal lymph nodes biopsy-proven to be breast cancer T2 N1 M0 stage IIB clinical stage S/P Neoadjuvant Dose dense AC folll by Abraxane X 12  left lumpectomy 08/23/2015 : Invasive ductal carcinoma grade 3, 2.8 cm, associated extensive DCIS with comedonecrosis, margins negative,1/2 sentinel nodes positive, ER 0%, PR 0% T2 N1 M0 stage IIB Completed radiation therapy 11/16/2015  Treatment plan: Adjuvant Xeloda 1000 mg meter square twice a day 2 weeks on 1 week off 8 cycles (6 months), planned start date 12/19/2015 After completion of Xeloda, we will consider enrollment in adjuvant immunotherapy clinical trial.  Return to clinic on 01/16/2016 for blood count check and for starting cycle 2 of treatment.

## 2015-11-16 NOTE — Telephone Encounter (Signed)
Called pt to congratulate on completing xrt. Relate doing well and without complaints. Discussed survivorship program and referal. Encourage pt to call with needs or questions. Received verbal understanding.

## 2015-11-16 NOTE — Progress Notes (Signed)
Patient Care Team: Thressa Sheller, MD as PCP - General (Internal Medicine) Erroll Luna, MD as Consulting Physician (General Surgery) Nicholas Lose, MD as Consulting Physician (Hematology and Oncology) Gery Pray, MD as Consulting Physician (Radiation Oncology) Mauro Kaufmann, RN as Registered Nurse Rockwell Germany, RN as Registered Nurse  DIAGNOSIS: Breast cancer of upper-outer quadrant of left female breast [facility])   Staging form: Breast, AJCC 7th Edition     Clinical stage from 03/07/2015: Stage IIB (T2, N1, M0) - Unsigned       Staging comments: Staged at breast conference on 5.18.16      Pathologic stage from 08/27/2015: Stage IIB (yT2, N1a, cM0) - Signed by Enid Cutter, MD on 09/10/2015       Staging comments: Staged on final surgical specimen by Dr. Avis Epley    SUMMARY OF ONCOLOGIC HISTORY:   Breast cancer of upper-outer quadrant of left female breast ([place])   02/15/2015 Mammogram Left breast increased density, mass is irregular with microcalcifications measuring 2.9 cm, cyst in the breast as well as abnormal enlarged lymph nodes   02/20/2015 Initial Diagnosis Left breast biopsy: Invasive ductal carcinoma with DCIS, axillary lymph node biopsy positive, ER 0%, PR 0%, HER-2 negative ratio 1.13 with Ki-67 75%   03/15/2015 PET scan 2.8 cm solid irregular left breast mass which is hypermetabolic and consistent with known left breast cancer. 2. Weakly positive axillary lymph nodes or equivocal   03/20/2015 Breast MRI 3.1 cm irregular enhancing mass located within the left breast at the 1 o'clock position; 1.6 cm Left axillary LN   03/22/2015 - 08/02/2015 Neo-Adjuvant Chemotherapy Neo-adjuvant chemotherapy with dose dense Adriamycin and Cytoxan 4 followed by Abraxane weekly 12   08/06/2015 Breast MRI left breast now measures 0.9 x 0.8 x 1.9 cm (previously measured 3.0 x 3.1 x 1.8 cm). Dec size of abnormal Left axill LN   08/23/2015 Surgery left lumpectomy: Invasive ductal carcinoma grade 3,  2.8 cm, associated extensive DCIS with comedonecrosis, margins negative,1/2 sentinel nodes positive, ER 0%, PR 0%   10/01/2015 - 11/16/2015 Radiation Therapy Adjuvant radiation therapy    CHIEF COMPLIANT: today's last radiation treatment  INTERVAL HISTORY: Carla Bennett is a 81 year old with above-mentioned history of left breast cancer treated with neoadjuvant chemotherapy followed by lumpectomy and still had residual disease, completed adjuvant radiation therapy and is here today to discuss further treatment plan. She feels fatigued and sore on the chest wall related to radiation dermatitis. Neuropathy appears to be getting better.  REVIEW OF SYSTEMS:   Constitutional: Denies fevers, chills or abnormal weight loss Eyes: Denies blurriness of vision Ears, nose, mouth, throat, and face: Denies mucositis or sore throat Respiratory: Denies cough, dyspnea or wheezes Cardiovascular: Denies palpitation, chest discomfort Gastrointestinal:  Denies nausea, heartburn or change in bowel habits Skin: Denies abnormal skin rashes Lymphatics: Denies new lymphadenopathy or easy bruising Neurological:improving neuropathy Behavioral/Psych: Mood is stable, no new changes  Extremities: No lower extremity edema Breast: soreness in the breast from recent radiation All other systems were reviewed with the patient and are negative.  I have reviewed the past medical history, past surgical history, social history and family history with the patient and they are unchanged from previous note.  ALLERGIES:  has No Known Allergies.  MEDICATIONS:  Current Outpatient Prescriptions  Medication Sig Dispense Refill  . B Complex Vitamins (VITAMIN B COMPLEX PO) Take 1 tablet by mouth daily.     . furosemide (LASIX) 20 MG tablet Take 20 mg by mouth daily.     Marland Kitchen  hyaluronate sodium (RADIAPLEXRX) GEL Apply 1 application topically 2 (two) times daily. Reported on 11/15/2015    . labetalol (NORMODYNE) 100 MG tablet Take 1  tablet (100 mg total) by mouth daily. (Patient taking differently: Take 100 mg by mouth daily. Pt takes 1/2 tablet daily= 50 mg) 30 tablet 0  . lisinopril (PRINIVIL,ZESTRIL) 40 MG tablet Take 40 mg by mouth daily.     Marland Kitchen loratadine (CLARITIN) 10 MG tablet Take 10 mg by mouth daily.    . non-metallic deodorant Jethro Poling) MISC Apply 1 application topically daily as needed.    . Wound Dressings (SONAFINE) Apply 1 application topically 2 (two) times daily.     No current facility-administered medications for this visit.   Facility-Administered Medications Ordered in Other Visits  Medication Dose Route Frequency Provider Last Rate Last Dose  . sodium chloride 0.9 % injection 10 mL  10 mL Intracatheter PRN Nicholas Lose, MD   10 mL at 06/07/15 1559    PHYSICAL EXAMINATION: ECOG PERFORMANCE STATUS: 1 - Symptomatic but completely ambulatory  Filed Vitals:   11/16/15 0909  BP: 144/68  Pulse: 82  Temp: 97.8 F (36.6 C)  Resp: 18   Filed Weights   11/16/15 0909  Weight: 132 lb 14.4 oz (60.283 kg)    GENERAL:alert, no distress and comfortable SKIN: skin color, texture, turgor are normal, no rashes or significant lesions EYES: normal, Conjunctiva are pink and non-injected, sclera clear OROPHARYNX:no exudate, no erythema and lips, buccal mucosa, and tongue normal  NECK: supple, thyroid normal size, non-tender, without nodularity LYMPH:  no palpable lymphadenopathy in the cervical, axillary or inguinal LUNGS: clear to auscultation and percussion with normal breathing effort HEART: regular rate & rhythm and no murmurs and no lower extremity edema ABDOMEN:abdomen soft, non-tender and normal bowel sounds MUSCULOSKELETAL:no cyanosis of digits and no clubbing  NEURO: alert & oriented x 3 with fluent speech, grade 1 peripheral neuropathy EXTREMITIES: No lower extremity edema  LABORATORY DATA:  I have reviewed the data as listed   Chemistry      Component Value Date/Time   NA 142 08/08/2015 0842     NA 134* 04/15/2015 1556   K 3.6 08/08/2015 0842   K 3.7 04/15/2015 1556   CL 100* 04/15/2015 1556   CO2 24 08/08/2015 0842   CO2 26 04/15/2015 1556   BUN 9.0 08/08/2015 0842   BUN 10 04/15/2015 1556   CREATININE 1.0 08/08/2015 0842   CREATININE 1.18* 04/15/2015 1556      Component Value Date/Time   CALCIUM 10.9* 08/08/2015 0842   CALCIUM 10.5 05/24/2015 1508   ALKPHOS 64 08/08/2015 0842   ALKPHOS 69 03/16/2015 1133   AST 20 08/08/2015 0842   AST 22 03/16/2015 1133   ALT 14 08/08/2015 0842   ALT 18 03/16/2015 1133   BILITOT 0.65 08/08/2015 0842   BILITOT 0.6 03/16/2015 1133       Lab Results  Component Value Date   WBC 4.8 08/08/2015   HGB 12.2 08/08/2015   HCT 37.0 08/08/2015   MCV 91.8 08/08/2015   PLT 276 08/08/2015   NEUTROABS 3.0 08/08/2015    ASSESSMENT & PLAN:  Breast cancer of upper-outer quadrant of left female breast Left breast invasive ductal carcinoma grade 3 ER 0% PR 0% HER-2 negative Ki-67 75%, 2.9 cm mass with microcalcifications plus abnormal lymph nodes biopsy-proven to be breast cancer T2 N1 M0 stage IIB clinical stage S/P Neoadjuvant Dose dense AC folll by Abraxane X 12  left lumpectomy 08/23/2015 :  Invasive ductal carcinoma grade 3, 2.8 cm, associated extensive DCIS with comedonecrosis, margins negative,1/2 sentinel nodes positive, ER 0%, PR 0% T2 N1 M0 stage IIB Completed radiation therapy 11/16/2015  Treatment plan: Adjuvant Xeloda 1000 mg meter square twice a day 2 weeks on 1 week off 6 cycles I discussed the pros and cons of adjuvant Xeloda and patient is leaning towards not receiving this treatment. I then subsequently discussed the role for clinical trial using Pembrolizumab immunotherapy as adjuvant treatment. The trial is not currently open at this time. But we will assess if she  Would be interested when he does open in a few weeks.  Return to clinic in 6 months for surveillance or sooner if she is interested in the clinical trial.  No  orders of the defined types were placed in this encounter.   The patient has a good understanding of the overall plan. she agrees with it. she will call with any problems that may develop before the next visit here.   Rulon Eisenmenger, MD 11/16/2015

## 2015-11-16 NOTE — Progress Notes (Signed)
Pt reports intermittent neuropathy.   Anxious about this appt.  No other appts

## 2015-11-20 DIAGNOSIS — E210 Primary hyperparathyroidism: Secondary | ICD-10-CM | POA: Diagnosis not present

## 2015-11-20 DIAGNOSIS — N2581 Secondary hyperparathyroidism of renal origin: Secondary | ICD-10-CM | POA: Diagnosis not present

## 2015-11-20 DIAGNOSIS — N183 Chronic kidney disease, stage 3 (moderate): Secondary | ICD-10-CM | POA: Diagnosis not present

## 2015-11-20 DIAGNOSIS — I10 Essential (primary) hypertension: Secondary | ICD-10-CM | POA: Diagnosis not present

## 2015-11-20 DIAGNOSIS — C50919 Malignant neoplasm of unspecified site of unspecified female breast: Secondary | ICD-10-CM | POA: Diagnosis not present

## 2015-11-21 ENCOUNTER — Other Ambulatory Visit: Payer: Self-pay | Admitting: Adult Health

## 2015-11-21 DIAGNOSIS — C50412 Malignant neoplasm of upper-outer quadrant of left female breast: Secondary | ICD-10-CM

## 2015-11-22 ENCOUNTER — Telehealth: Payer: Self-pay | Admitting: Hematology and Oncology

## 2015-11-22 NOTE — Telephone Encounter (Signed)
Spoke with patient and confirmed survivorship appointment for 3/9

## 2015-11-26 NOTE — Progress Notes (Signed)
  Radiation Oncology         [phone] ________________________________  Name: Carla Bennett MRN: [id number]  Date: 11/16/2015  DOB: [birth year]  End of Treatment Note  DIAGNOSIS: Pathologic T2, N1, invasive ductal carcinoma of the left breast    Indication for treatment:  Breast conservation therapy and coverage of the axillary/supraclavicular regions       Radiation treatment dates:   10/01/2015 through 11/16/2015  Site/dose:   Left breast axillary and supraclavicular region, 45 gray in 25 fractions, the left breast received 3 additional treatments for a cumulative dose of 50.4 gray. Lumpectomy cavity boost 10 gray in 5 fractions  Beams/energy:   The left breast was treated with 3-D conformal radiation therapy using tangential beams with breath hold technique. The axillary/supraclavicular region received full dose with 2 fields to 45 gray, lumpectomy cavity boost was with a combination of 15 and 18 MeV electrons  Narrative: The patient tolerated radiation treatment relatively well.   She did experience some mild fatigue as well as darkening and erythema to the skin. No significant moist desquamation, however the some peeling of the skin  Plan: The patient has completed radiation treatment. The patient will return to radiation oncology clinic for routine followup in one month. I advised them to call or return sooner if they have any questions or concerns related to their recovery or treatment.  -----------------------------------  Blair Promise, PhD, MD

## 2015-12-07 DIAGNOSIS — H2513 Age-related nuclear cataract, bilateral: Secondary | ICD-10-CM | POA: Diagnosis not present

## 2015-12-24 ENCOUNTER — Encounter: Payer: Self-pay | Admitting: Oncology

## 2015-12-27 ENCOUNTER — Encounter: Payer: Self-pay | Admitting: Nurse Practitioner

## 2015-12-27 ENCOUNTER — Ambulatory Visit
Admission: RE | Admit: 2015-12-27 | Discharge: 2015-12-27 | Disposition: A | Discharge status: Home / Self Care | Payer: Medicare Other | Source: Ambulatory Visit | Attending: Radiation Oncology | Admitting: Radiation Oncology

## 2015-12-27 ENCOUNTER — Ambulatory Visit (HOSPITAL_BASED_OUTPATIENT_CLINIC_OR_DEPARTMENT_OTHER): Payer: Medicare Other | Admitting: Nurse Practitioner

## 2015-12-27 VITALS — BP 162/87 | HR 80 | Temp 97.8°F | Resp 20 | Wt 134.1 lb

## 2015-12-27 VITALS — BP 137/67 | HR 76 | Temp 97.5°F | Resp 19 | Ht 61.0 in | Wt 133.6 lb

## 2015-12-27 DIAGNOSIS — R5383 Other fatigue: Secondary | ICD-10-CM

## 2015-12-27 DIAGNOSIS — C773 Secondary and unspecified malignant neoplasm of axilla and upper limb lymph nodes: Secondary | ICD-10-CM | POA: Diagnosis not present

## 2015-12-27 DIAGNOSIS — C50412 Malignant neoplasm of upper-outer quadrant of left female breast: Secondary | ICD-10-CM

## 2015-12-27 DIAGNOSIS — Z171 Estrogen receptor negative status [ER-]: Secondary | ICD-10-CM | POA: Diagnosis not present

## 2015-12-27 NOTE — Progress Notes (Signed)
She is currently in no pain. Pt complains of fatigue, improving.   Pt left breast- warm dry and intact. Occasional pruritus to old port a cath site.  Pt denies edema. Pt continues to apply Sonafine as directed. BP 162/87 mmHg  Pulse 80  Temp(Src) 97.8 F (36.6 C) (Oral)  Resp 20  Wt 134 lb 1.6 oz (60.827 kg)  SpO2 99%  LMP 10/20/1996 Wt Readings from Last 3 Encounters:  12/27/15 134 lb 1.6 oz (60.827 kg)  11/16/15 132 lb 14.4 oz (60.283 kg)  11/15/15 134 lb 14.4 oz (61.19 kg)   Pt reports: Yes No Comments  Nolvadex (tamoxifen) []  [x]    Arimidex (anastrozole) []  [x]    Last Med Onc Appt: Date:11/16/15 Next:05/15/16 Doctor: Lindi Adie  Last Mammogram: Date: Next:July 2017   Survivorship Appt: Date:12/27/15 10am [x] [facility] pamphlet [x]  [employer] pamphlet

## 2015-12-27 NOTE — Progress Notes (Signed)
CLINIC:  Cancer Survivorship   REASON FOR VISIT:  Routine follow-up post-treatment for a recent history of breast cancer.  BRIEF ONCOLOGIC HISTORY:    Breast cancer of upper-outer quadrant of left female breast ([place])   02/15/2015 Mammogram Left breast increased density, mass is irregular with microcalcifications measuring 2.9 cm, cyst in the breast as well as abnormal enlarged lymph nodes   02/20/2015 Initial Diagnosis Left breast biopsy: Invasive ductal carcinoma with DCIS, axillary lymph node biopsy positive, ER 0%, PR 0%, HER-2 negative ratio 1.13 with Ki-67 75%   03/08/2015 Procedure OvaNext panel [name]) reveals no clinically significant variant at ATM, BARD1, BRCA1, BRCA2, BRIP1, CDH1, CHEK2, EPCAM, MLH1, MRE11A, MSH2, MSH6, MUTYH, NBN, NF1, PALB2, PMS2, PTEN, RAD50, RAD51C, RAD51D, SMARCA4, STK11, and TP53.    03/15/2015 PET scan 2.8 cm solid irregular left breast mass which is hypermetabolic and consistent with known left breast cancer. 2. Weakly positive axillary lymph nodes or equivocal   03/20/2015 Breast MRI 3.1 cm irregular enhancing mass located within the left breast at the 1 o'clock position; 1.6 cm Left axillary LN   03/20/2015 Clinical Stage Stage IIB: T2 N1   03/22/2015 - 08/02/2015 Neo-Adjuvant Chemotherapy Neo-adjuvant chemotherapy with dose dense Adriamycin and Cytoxan 4 followed by Abraxane weekly 12   08/06/2015 Breast MRI Left breast now measures 0.9 x 0.8 x 1.9 cm (previously measured 3.0 x 3.1 x 1.8 cm). Dec size of abnormal Left axill LN   08/23/2015 Surgery Left lumpectomy: Invasive ductal carcinoma grade 3, 2.8 cm, associated extensive DCIS with comedonecrosis, margins negative,1/2 sentinel nodes positive, ER 0%, PR 0%, HER2/neu repeated and remains negative (ratio 1.13)   08/23/2015 Pathologic Stage Stage IIB: ypT2 ypN1   10/01/2015 - 11/16/2015 Radiation Therapy Adjuvant RT: Left breast axillary and supraclavicular region, 45 gray in 25 fractions, the left breast received  3 additional treatments for a cumulative dose of 50.4 gray. Lumpectomy cavity boost 10 gray in 5 fractions    INTERVAL HISTORY:  Ms. Burkman presents to the [facility] today for our initial meeting to review her survivorship care plan detailing her treatment course for breast cancer, as well as monitoring long-term side effects of that treatment, education regarding health maintenance, screening, and overall wellness and health promotion.     Overall, Ms. Cofer reports feeling pretty well since completing her radiation therapy approximately six weeks ago. She remains fatigued but reports the skin changes over her left breast have improved.  She saw Dr. Sondra Come immediately prior to this visit who felt her to be doing well. She denies headache, shortness of breath or bone pain.  She denies any lump or mass within either breast. She has had some coughing related to her allergies.  She has a good appetite and denies weight loss.    REVIEW OF SYSTEMS:  General: Fatigue as above. Denies fever, chills, unintentional weight loss, or night sweats. HEENT: Denies visual changes, hearing loss, mouth sores or difficulty swallowing. Cardiac: Denies palpitations, chest pain, and lower extremity edema.  Respiratory: Cough / allergies as above, otherwise, denies wheeze or dyspnea on exertion.  Breast: Denies any new nodularity, masses, tenderness, nipple changes, or nipple discharge.  GI: Intermittient constipation for which she takes stool softener.  Denies abdominal pain, constipation, diarrhea, nausea, or vomiting.  GU: Denies dysuria, hematuria, vaginal bleeding, vaginal discharge, or vaginal dryness.  Musculoskeletal: Denies joint or bone pain.  Neuro: Denies recent fall or numbness / tingling in her extremities. Skin: Denies rash, pruritis, or open wounds.  Psych: Some difficulty with sleep due to anxiety. Otherwise, denies depression or memory loss.   A 14-point review of systems was completed and  was negative, except as noted above.   ONCOLOGY TREATMENT TEAM:  1. Surgeon:  Dr. Brantley Stage at [facility]  2. Medical Oncologist: Dr. Lindi Adie 3. Radiation Oncologist: Dr. Sondra Come    PAST MEDICAL/SURGICAL HISTORY:  Past Medical History  Diagnosis Date  . Diverticulosis of colon (without mention of hemorrhage)   . Benign neoplasm of colon   . Diverticulitis   . Arthritis   . Gout   . Hypertension   . Allergy   . Other issue of medical certificates     uterine myomata  . Hematuria     negative urology work up  . Hx of abnormal cervical Pap smear     LEEP-CIN I, negative margins and ECC  . Uterine prolaps   . Cystocele   . Basal cell carcinoma   . Hypercalcemia   . Cholelithiasis   . History of blood transfusion     x 1 with D&C  . Renal disease 3/16    Stage 3 kidney disease-seeing Dr Buddy Duty and Dr Marval Regal  . Breast cancer of upper-outer quadrant of left female breast ([place]) 02/23/2015  . Heart murmur   . Parathyroid abnormality ([place])   . Radiation 10/01/15-11/16/15    left breast axillary and supraclavicular reigion 45 gray, left breast 50.4 gray, lumpectomy cavity boost 10 gray   Past Surgical History  Procedure Laterality Date  . Cystocele repair  08/2010    Rectocele, vault prolapse  . Dilation and curettage of uterus      x2  . Tonsillectomy and adenoidectomy    . Cholecystectomy    . Total vaginal hysterectomy      secondary to prolapse, ovaries not removed  . Portacath placement Right 03/20/2015    Procedure: INSERTION PORT-A-CATH;  Surgeon: Erroll Luna, MD;  Location: [facility];  Service: General;  Laterality: Right;  . Radioactive seed guided mastectomy with axillary sentinel lymph node biopsy Left 08/23/2015    Procedure: LEFT BREAST PARTIAL MASTECTOMY WITH SENTINEL LYMPH NODE MAPPING;  Surgeon: Erroll Luna, MD;  Location: [facility];  Service: General;  Laterality: Left;  . Port-a-cath removal Right 08/23/2015    Procedure: REMOVAL  PORT-A-CATH;  Surgeon: Erroll Luna, MD;  Location: [facility];  Service: General;  Laterality: Right;     ALLERGIES:  No Known Allergies   CURRENT MEDICATIONS:  Current Outpatient Prescriptions on File Prior to Visit  Medication Sig Dispense Refill  . B Complex Vitamins (VITAMIN B COMPLEX PO) Take 1 tablet by mouth daily.     . furosemide (LASIX) 20 MG tablet Take 20 mg by mouth daily.     . hyaluronate sodium (RADIAPLEXRX) GEL Apply 1 application topically 2 (two) times daily. Reported on 11/15/2015    . labetalol (NORMODYNE) 100 MG tablet Take 1 tablet (100 mg total) by mouth daily. (Patient taking differently: Take 100 mg by mouth daily. Pt takes 1/2 tablet daily= 50 mg) 30 tablet 0  . lisinopril (PRINIVIL,ZESTRIL) 40 MG tablet Take 40 mg by mouth daily.     Marland Kitchen loratadine (CLARITIN) 10 MG tablet Take 10 mg by mouth daily.    . Wound Dressings (SONAFINE) Apply 1 application topically 2 (two) times daily.     Current Facility-Administered Medications on File Prior to Visit  Medication Dose Route Frequency Provider Last Rate Last Dose  . sodium chloride 0.9 %  injection 10 mL  10 mL Intracatheter PRN Nicholas Lose, MD   10 mL at 06/07/15 1559     ONCOLOGIC FAMILY HISTORY:  Family History  Problem Relation Age of Onset  . Breast cancer Sister 33  . Lung cancer Father 46  . Parkinson's disease Mother   . Alzheimer's disease Mother     ? diag  . Lung cancer Mother   . Multiple sclerosis Sister   . Colon cancer Neg Hx   . Esophageal cancer Neg Hx   . Rectal cancer Neg Hx   . Stomach cancer Neg Hx   . Prostate cancer Maternal Uncle 63  . Cancer Maternal Uncle     stomache  . Cancer Maternal Uncle 76    GI cancer - ? stomach  . Cancer Cousin 87    mat female first cousin (son of mat uncle with GI cancer) with ureter cancer     GENETIC COUNSELING/TESTING: Yes, performed 03/08/2015: OvaNext panel [name]) reveals no clinically significant variant at ATM, BARD1,  BRCA1, BRCA2, BRIP1, CDH1, CHEK2, EPCAM, MLH1, MRE11A, MSH2, MSH6, MUTYH, NBN, NF1, PALB2, PMS2, PTEN, RAD50, RAD51C, RAD51D, SMARCA4, STK11, and TP53.    SOCIAL HISTORY:  Roselene Gray Schonberger is married and lives with her spouse in [place], [place].  She has 4 children. Ms. Champine is currently retired.  She denies any current or history of tobacco or illicit drug use.  She uses alcohol on occasion.   PHYSICAL EXAMINATION:  Vital Signs: Filed Vitals:   12/27/15 1019  BP: 137/67  Pulse: 76  Temp: 97.5 F (36.4 C)  Resp: 19   ECOG Performance Status: 0  General: Well-nourished, well-appearing female in no acute distress.  She is unaccompanied in clinic today.   HEENT: Head is atraumatic and normocephalic.  Pupils equal and reactive to light and accomodation. Conjunctivae clear without exudate.  Sclerae anicteric. Oral mucosa is pink, moist, and intact without lesions.  Oropharynx is pink without lesions or erythema.  Lymph: No cervical, supraclavicular, infraclavicular, or axillary lymphadenopathy noted on palpation.  Cardiovascular: Regular rate and rhythm without murmurs, rubs, or gallops. Respiratory: Clear to auscultation bilaterally. Chest expansion symmetric without accessory muscle use on inspiration or expiration.  GI: Abdomen soft and round. No tenderness to palpation. Bowel sounds normoactive in 4 quadrants. GU: Deferred.    Neuro: No focal deficits. Steady gait.  Psych: Mood and affect normal and appropriate for situation.  Extremities: No edema, cyanosis, or clubbing.  Skin: Warm and dry. No open lesions noted.   LABORATORY DATA:  None for this visit.  DIAGNOSTIC IMAGING:  None for this visit.     ASSESSMENT AND PLAN:   1. Breast cancer: Stage IIB invasive ductal carcinoma with DCIS of the left breast (02/2015), ER negative, PR negative, HER2/neu negative, S/P neoadjuvant chemotherapy with dose dense adriamycin and cyclophosphamide x4 followed by nab-paclitaxel  x 12 (completed 07/2015) with partial response on imaging, S/P left lumpectomy/SLNB (08/2015) with residual disease followed by adjuvant radiation therapy (completed 10/2015) now followed in a program of surveillance.  Ms. Greenwalt is doing well without clinical symptoms worrisome for disease recurrence. She will follow-up with her medical oncologist,  Dr. Lindi Adie, in July 2017 with history and physical examination per surveillance protocol and will see Dr. Sondra Come in September 2017. She believes she is seeing Dr. Brantley Stage next week.  A comprehensive survivorship care plan and treatment summary was reviewed with the patient today detailing her breast cancer diagnosis, treatment course, potential late/long-term effects  of treatment, appropriate follow-up care with recommendations for the future, and patient education resources.  A copy of this summary, along with a letter will be sent to the patient's primary care provider via in basket message after today's visit.  Ms. Quackenbush is welcome to return to the Survivorship Clinic in the future, as needed; no follow-up will be scheduled at this time.    2. Cancer screening:  Due to Ms. Westry's history and her age, she should receive screening for skin cancers.  She is S/P hysterectomy and has been told that she will no longer need colonscopies.  The information and recommendations are listed on the patient's comprehensive care plan/treatment summary and were reviewed in detail with the patient.    3. Health maintenance and wellness promotion: Ms. Oh was encouraged to consume 5-7 servings of fruits and vegetables per day. We reviewed the "Nutrition Rainbow" handout, as well as discussed recommendations to maximize nutrition and minimize recurrence, such as increased intake of fruits, vegetables, lean proteins, and minimizing the intake of red meats and processed foods.  She was also encouraged to engage in moderate to vigorous exercise for 30 minutes per day most days of  the week. We discussed the [employer] fitness program, which is designed for cancer survivors to help them become more physically fit after cancer treatments.  She was instructed to limit her alcohol consumption and continue to abstain from tobacco use. A copy of the "Take Control of Your Health" brochure was given to her reinforcing these recommendations.   4. Support services/counseling: It is not uncommon for this period of the patient's cancer care trajectory to be one of many emotions and stressors.  We discussed an opportunity for her to participate in the next session of [facility] ("Finding Your New Normal") support group series designed for patients after they have completed treatment.   Ms. Selkirk was encouraged to take advantage of our many other support services programs, support groups, and/or counseling in coping with her new life as a cancer survivor after completing anti-cancer treatment.  She was offered support today through active listening and expressive supportive counseling.  She was given information regarding our available services and encouraged to contact me with any questions or for help enrolling in any of our support group/programs.    A total of 40 minutes of face-to-face time was spent with this patient with greater than 50% of that time in counseling and care-coordination.   Sylvan Cheese, NP  Survivorship Program [facility] [phone]   Note: Gann Thressa Sheller, [place] [phone]

## 2015-12-27 NOTE — Progress Notes (Signed)
Radiation Oncology         [phone] ________________________________  Name: Carla Bennett MRN: [id number]  Date: 12/27/2015  DOB: October 10, 1935     Follow-Up Visit Note  CC: Thressa Sheller, MD  Nicholas Lose, MD   Diagnosis:   Pathologic T2, N1, invasive ductal carcinoma of the left breast    Indication for treatment:  Breast conservation therapy and coverage of the axillary/supraclavicular regions       Radiation treatment dates:   10/01/2015 through 11/16/2015  Site/dose:   Left breast axillary and supraclavicular region, 45 gray in 25 fractions, the left breast received 3 additional treatments for a cumulative dose of 50.4 gray. Lumpectomy cavity boost 10 gray in 5 fractions  Narrative:  The patient returns today for routine follow-up.   She is currently in no pain. Pt complains of fatigue, improving. Pt left breast- warm dry and intact. Occasional pruritus to old port a cath site. Pt denies edema. Pt continues to apply Sonafine as directed. Denies itching or discomfort. Complains of allergies; coughing  BP 162/87 mmHg  Pulse 80  Temp(Src) 97.8 F (36.6 C) (Oral)  Resp 20  Wt 134 lb 1.6 oz (60.827 kg)  SpO2 99%  LMP 10/20/1996 Wt Readings from Last 3 Encounters:  12/27/15 134 lb 1.6 oz (60.827 kg)  11/16/15 132 lb 14.4 oz (60.283 kg)  11/15/15 134 lb 14.4 oz (61.19 kg)   Pt reports: Yes No Comments  Nolvadex (tamoxifen) [ ]  [X]    Arimidex (anastrozole) [ ]  [X]    Last Med Onc Appt: Date:11/16/15 Next:05/15/16 Doctor: Lindi Adie  Last Mammogram: Date: Next:July 2017   Survivorship Appt: Date:12/27/15 10am [X] [facility] pamphlet [X]  [employer] pamphlet                                    ALLERGIES:  has No Known Allergies.  Meds: Current Outpatient Prescriptions  Medication Sig Dispense Refill  . Wound Dressings (SONAFINE) Apply 1 application topically 2 (two) times daily.    . B Complex Vitamins (VITAMIN B COMPLEX PO) Take 1 tablet  by mouth daily.     . furosemide (LASIX) 20 MG tablet Take 20 mg by mouth daily.     . hyaluronate sodium (RADIAPLEXRX) GEL Apply 1 application topically 2 (two) times daily. Reported on 11/15/2015    . labetalol (NORMODYNE) 100 MG tablet Take 1 tablet (100 mg total) by mouth daily. (Patient taking differently: Take 100 mg by mouth daily. Pt takes 1/2 tablet daily= 50 mg) 30 tablet 0  . lisinopril (PRINIVIL,ZESTRIL) 40 MG tablet Take 40 mg by mouth daily.     Marland Kitchen loratadine (CLARITIN) 10 MG tablet Take 10 mg by mouth daily.     No current facility-administered medications for this encounter.   Facility-Administered Medications Ordered in Other Encounters  Medication Dose Route Frequency Provider Last Rate Last Dose  . sodium chloride 0.9 % injection 10 mL  10 mL Intracatheter PRN Nicholas Lose, MD   10 mL at 06/07/15 1559    Physical Findings: The patient is in no acute distress. Patient is alert and oriented.  weight is 134 lb 1.6 oz (60.827 kg). Her oral temperature is 97.8 F (36.6 C). Her blood pressure is 162/87 and her pulse is 80. Her respiration is 20 and oxygen saturation is 99%. .  No significant changes. No palpable cervical, supraclavicular or axillary lymphoadenopathy. The heart has a regular rate and rhythm. The lungs are  clear to auscultation. Right breast unremarkable. Left breast with edema consistent with radiation effect. Skin is well healed. No palpable mass or nipple discharge.   Lab Findings: Lab Results  Component Value Date   WBC 4.8 08/08/2015   HGB 12.2 08/08/2015   HCT 37.0 08/08/2015   MCV 91.8 08/08/2015   PLT 276 08/08/2015    Radiographic Findings: No results found.  Impression:  The patient is recovering from the effects of radiation.  No signs of recurrence on clinical exam tod,   Plan:  Continued follow up with Dr.[name], 6 month follow up with radiation oncology  -----------------------------------  Blair Promise, PhD, MD  This document serves  as a record of services personally performed by Gery Pray, MD. It was created on his behalf by Derek Mound, a trained medical scribe. The creation of this record is based on the scribe's personal observations and the provider's statements to them. This document has been checked and approved by the attending provider.

## 2016-01-01 DIAGNOSIS — Z853 Personal history of malignant neoplasm of breast: Secondary | ICD-10-CM | POA: Diagnosis not present

## 2016-01-04 ENCOUNTER — Telehealth: Payer: Self-pay | Admitting: Genetic Counselor

## 2016-01-04 NOTE — Telephone Encounter (Signed)
Carla Bennett had reached out to someone at the hospital about getting a copy of her genetic test result and a results letter to share with her daughter.  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  A further online search found that some labs were calling this a likely benign result now.  I called [facility] to learn whether or not they had also updated this result, and they had also updated it to a VUS - likely benign.  I called Carla Bennett and delivered the news.  She is happy to hear it.  I let her know I would send her a copy of her test results (a new copy has not yet been released) along with a results letter that reviews the new change and what the result means.  She should receive this sometime next week.  I gave her my phone number so she can call me with any questions.

## 2016-01-07 ENCOUNTER — Encounter: Payer: Self-pay | Admitting: Genetic Counselor

## 2016-02-07 ENCOUNTER — Emergency Department (HOSPITAL_BASED_OUTPATIENT_CLINIC_OR_DEPARTMENT_OTHER): Payer: Medicare Other

## 2016-02-07 ENCOUNTER — Emergency Department (HOSPITAL_BASED_OUTPATIENT_CLINIC_OR_DEPARTMENT_OTHER)
Admission: EM | Admit: 2016-02-07 | Discharge: 2016-02-07 | Disposition: A | Discharge status: Home / Self Care | Payer: Medicare Other | Attending: Emergency Medicine | Admitting: Emergency Medicine

## 2016-02-07 ENCOUNTER — Encounter (HOSPITAL_BASED_OUTPATIENT_CLINIC_OR_DEPARTMENT_OTHER): Payer: Self-pay | Admitting: *Deleted

## 2016-02-07 DIAGNOSIS — R52 Pain, unspecified: Secondary | ICD-10-CM | POA: Diagnosis not present

## 2016-02-07 DIAGNOSIS — Z85828 Personal history of other malignant neoplasm of skin: Secondary | ICD-10-CM | POA: Diagnosis not present

## 2016-02-07 DIAGNOSIS — Z8719 Personal history of other diseases of the digestive system: Secondary | ICD-10-CM | POA: Diagnosis not present

## 2016-02-07 DIAGNOSIS — M6281 Muscle weakness (generalized): Secondary | ICD-10-CM | POA: Diagnosis not present

## 2016-02-07 DIAGNOSIS — Z79899 Other long term (current) drug therapy: Secondary | ICD-10-CM | POA: Insufficient documentation

## 2016-02-07 DIAGNOSIS — Z8739 Personal history of other diseases of the musculoskeletal system and connective tissue: Secondary | ICD-10-CM | POA: Diagnosis not present

## 2016-02-07 DIAGNOSIS — Z8742 Personal history of other diseases of the female genital tract: Secondary | ICD-10-CM | POA: Diagnosis not present

## 2016-02-07 DIAGNOSIS — R0602 Shortness of breath: Secondary | ICD-10-CM | POA: Diagnosis not present

## 2016-02-07 DIAGNOSIS — Z853 Personal history of malignant neoplasm of breast: Secondary | ICD-10-CM | POA: Diagnosis not present

## 2016-02-07 DIAGNOSIS — Z87448 Personal history of other diseases of urinary system: Secondary | ICD-10-CM | POA: Diagnosis not present

## 2016-02-07 DIAGNOSIS — G529 Cranial nerve disorder, unspecified: Secondary | ICD-10-CM | POA: Insufficient documentation

## 2016-02-07 DIAGNOSIS — I10 Essential (primary) hypertension: Secondary | ICD-10-CM | POA: Insufficient documentation

## 2016-02-07 DIAGNOSIS — R011 Cardiac murmur, unspecified: Secondary | ICD-10-CM | POA: Insufficient documentation

## 2016-02-07 DIAGNOSIS — Z86018 Personal history of other benign neoplasm: Secondary | ICD-10-CM | POA: Diagnosis not present

## 2016-02-07 DIAGNOSIS — M7989 Other specified soft tissue disorders: Secondary | ICD-10-CM | POA: Diagnosis not present

## 2016-02-07 DIAGNOSIS — R0981 Nasal congestion: Secondary | ICD-10-CM | POA: Diagnosis not present

## 2016-02-07 DIAGNOSIS — J90 Pleural effusion, not elsewhere classified: Secondary | ICD-10-CM | POA: Diagnosis not present

## 2016-02-07 LAB — BASIC METABOLIC PANEL
Anion gap: 8 (ref 5–15)
BUN: 15 mg/dL (ref 6–20)
CALCIUM: 10.3 mg/dL (ref 8.9–10.3)
CO2: 23 mmol/L (ref 22–32)
Chloride: 106 mmol/L (ref 101–111)
Creatinine, Ser: 0.98 mg/dL (ref 0.44–1.00)
GFR calc Af Amer: >60 mL/min (ref >60)
GFR, EST NON AFRICAN AMERICAN: 54 mL/min — AB (ref >60)
GLUCOSE: 99 mg/dL (ref 65–99)
Potassium: 3.6 mmol/L (ref 3.5–5.1)
Sodium: 137 mmol/L (ref 135–145)

## 2016-02-07 LAB — D-DIMER, QUANTITATIVE (NOT AT ARMC): D DIMER QUANT: 0.5 ug{FEU}/mL (ref 0.00–0.50)

## 2016-02-07 LAB — CBC
HCT: 42.3 % (ref 36.0–46.0)
Hemoglobin: 14.4 g/dL (ref 12.0–15.0)
MCH: 29.6 pg (ref 26.0–34.0)
MCHC: 34 g/dL (ref 30.0–36.0)
MCV: 86.9 fL (ref 78.0–100.0)
Platelets: 286 10*3/uL (ref 150–400)
RBC: 4.87 MIL/uL (ref 3.87–5.11)
RDW: 14.6 % (ref 11.5–15.5)
WBC: 8 10*3/uL (ref 4.0–10.5)

## 2016-02-07 LAB — TROPONIN I: TROPONIN I: 0.03 ng/mL (ref <0.031)

## 2016-02-07 MED ORDER — ALBUTEROL SULFATE HFA 108 (90 BASE) MCG/ACT IN AERS
2.0000 | INHALATION_SPRAY | Freq: Four times a day (QID) | RESPIRATORY_TRACT | Status: DC
Start: 2016-02-07 — End: 2016-02-07
  Administered 2016-02-07: 2 via RESPIRATORY_TRACT
  Filled 2016-02-07: qty 6.7

## 2016-02-07 NOTE — ED Notes (Signed)
Dr. Zackowski at bedside  

## 2016-02-07 NOTE — Discharge Instructions (Signed)
Return for any new or worse symptoms return for worse shortness of breath. Negative limited to follow-up with your doctor for further evaluation of the fluid around the heart. Also trial of albuterol inhaler to see if that helps with the shortness of breath some of the symptoms could be related to the heavy pollen.

## 2016-02-07 NOTE — ED Notes (Signed)
Patient transported to and from radiology department via stretcher.

## 2016-02-07 NOTE — ED Notes (Signed)
Sob and weakness x 2 weeks. She was seen at [facility] today and was told to come here.

## 2016-02-07 NOTE — ED Provider Notes (Signed)
CSN: [id number]     Arrival date & time 02/07/16  1423 History   First MD Initiated Contact with Patient 02/07/16 1511     Chief Complaint  Patient presents with  . Shortness of Breath     (Consider location/radiation/quality/duration/timing/severity/associated sxs/prior Treatment) Patient is a 81 y.o. female presenting with shortness of breath. The history is provided by the patient.  Shortness of Breath Associated symptoms: no abdominal pain, no chest pain, no fever, no rash and no vomiting   Patient with complaint of shortness of breath and generalized weakness nonfocal for 2 weeks. Patient seen in urgent care and referred here for further evaluation. Patient does have a history of seasonal allergies. Has been taking an antihistamine. Patient does not use oxygen at home. No fevers. Patient states is been a slight increase in leg swelling. Also bodyaches which may be arthritis. Patient has not felt like she's had the flu or an upper respiratory infection.  Past Medical History  Diagnosis Date  . Diverticulosis of colon (without mention of hemorrhage)   . Benign neoplasm of colon   . Diverticulitis   . Arthritis   . Gout   . Hypertension   . Allergy   . Other issue of medical certificates     uterine myomata  . Hematuria     negative urology work up  . Hx of abnormal cervical Pap smear     LEEP-CIN I, negative margins and ECC  . Uterine prolaps   . Cystocele   . Basal cell carcinoma   . Hypercalcemia   . Cholelithiasis   . History of blood transfusion     x 1 with D&C  . Renal disease 3/16    Stage 3 kidney disease-seeing Dr Buddy Duty and Dr Marval Regal  . Breast cancer of upper-outer quadrant of left female breast ([place]) 02/23/2015  . Heart murmur   . Parathyroid abnormality ([place])   . Radiation 10/01/15-11/16/15    left breast axillary and supraclavicular reigion 45 gray, left breast 50.4 gray, lumpectomy cavity boost 10 gray   Past Surgical History  Procedure Laterality Date   . Cystocele repair  08/2010    Rectocele, vault prolapse  . Dilation and curettage of uterus      x2  . Tonsillectomy and adenoidectomy    . Cholecystectomy    . Total vaginal hysterectomy      secondary to prolapse, ovaries not removed  . Portacath placement Right 03/20/2015    Procedure: INSERTION PORT-A-CATH;  Surgeon: Erroll Luna, MD;  Location: [facility];  Service: General;  Laterality: Right;  . Radioactive seed guided mastectomy with axillary sentinel lymph node biopsy Left 08/23/2015    Procedure: LEFT BREAST PARTIAL MASTECTOMY WITH SENTINEL LYMPH NODE MAPPING;  Surgeon: Erroll Luna, MD;  Location: [facility];  Service: General;  Laterality: Left;  . Port-a-cath removal Right 08/23/2015    Procedure: REMOVAL PORT-A-CATH;  Surgeon: Erroll Luna, MD;  Location: [facility];  Service: General;  Laterality: Right;   Family History  Problem Relation Age of Onset  . Breast cancer Sister 50  . Lung cancer Father 63  . Parkinson's disease Mother   . Alzheimer's disease Mother     ? diag  . Lung cancer Mother   . Multiple sclerosis Sister   . Colon cancer Neg Hx   . Esophageal cancer Neg Hx   . Rectal cancer Neg Hx   . Stomach cancer Neg Hx   . Prostate cancer Maternal Uncle 63  .  Cancer Maternal Uncle     stomache  . Cancer Maternal Uncle 62    GI cancer - ? stomach  . Cancer Cousin 49    mat female first cousin (son of mat uncle with GI cancer) with ureter cancer   Social History  Substance Use Topics  . Smoking status: Never Smoker   . Smokeless tobacco: Never Used  . Alcohol Use: Yes     Comment: occasionally   OB History    Gravida Para Term Preterm AB TAB SAB Ectopic Multiple Living   6 4   2  2   4      Review of Systems  Constitutional: Negative for fever and fatigue.  HENT: Positive for congestion.   Eyes: Negative for visual disturbance.  Respiratory: Positive for shortness of breath.   Cardiovascular: Negative for chest pain  and leg swelling.  Gastrointestinal: Negative for nausea, vomiting and abdominal pain.  Genitourinary: Negative for dysuria.  Musculoskeletal: Negative for back pain.  Skin: Negative for rash.  Hematological: Does not bruise/bleed easily.  Psychiatric/Behavioral: Negative for confusion.      Allergies  Review of patient's allergies indicates no known allergies.  Home Medications   Prior to Admission medications   Medication Sig Start Date End Date Taking? Authorizing Provider  B Complex Vitamins (VITAMIN B COMPLEX PO) Take 1 tablet by mouth daily.     Historical Provider, MD  furosemide (LASIX) 20 MG tablet Take 20 mg by mouth daily.  01/24/15   Historical Provider, MD  labetalol (NORMODYNE) 100 MG tablet Take 1 tablet (100 mg total) by mouth daily. Patient taking differently: Take 100 mg by mouth daily. Pt takes 1/2 tablet daily= 50 mg 06/07/15   Susanne Borders, NP  lisinopril (PRINIVIL,ZESTRIL) 40 MG tablet Take 40 mg by mouth daily.  09/01/13   Historical Provider, MD  loratadine (CLARITIN) 10 MG tablet Take 10 mg by mouth daily.    Historical Provider, MD   BP 142/89 mmHg  Pulse 85  Temp(Src) 98.2 F (36.8 C) (Oral)  Resp 20  Ht 5\' 1"  (1.549 m)  Wt 60.328 kg  BMI 25.14 kg/m2  SpO2 97%  LMP 10/20/1996 Physical Exam  Constitutional: She is oriented to person, place, and time. She appears well-developed and well-nourished. No distress.  HENT:  Head: Normocephalic and atraumatic.  Mouth/Throat: Oropharynx is clear and moist.  Eyes: Conjunctivae and EOM are normal. Pupils are equal, round, and reactive to light.  Neck: Normal range of motion. Neck supple.  Cardiovascular: Normal rate, regular rhythm and normal heart sounds.   No murmur heard. Pulmonary/Chest: Effort normal and breath sounds normal. No respiratory distress. She has no wheezes. She has no rales.  Abdominal: Soft. Bowel sounds are normal. There is no tenderness.  Musculoskeletal: Normal range of motion. She  exhibits no edema.  Neurological: She is alert and oriented to person, place, and time. A cranial nerve deficit is present. She exhibits normal muscle tone. Coordination normal.  Skin: Skin is warm. No rash noted. No erythema.  Nursing note and vitals reviewed.   ED Course  Procedures (including critical care time) Labs Review Labs Reviewed  BASIC METABOLIC PANEL - Abnormal; Notable for the following:    GFR calc non Af Amer 54 (*)    All other components within normal limits  CBC  TROPONIN I  D-DIMER, QUANTITATIVE (NOT AT [facility])    Imaging Review Dg Chest 2 View  02/07/2016  CLINICAL DATA:  Shortness of breath and weakness for  2 weeks EXAM: CHEST  2 VIEW COMPARISON:  03/20/2015 FINDINGS: Cardiac shadow is mildly enlarged. Bibasilar atelectasis/ infiltrate is seen. The previously noted right chest wall port has been removed in the interval. No acute bony abnormality is noted. IMPRESSION: Bibasilar atelectasis/ infiltrate. Electronically Signed   By: Inez Catalina M.D.   On: 02/07/2016 15:29   Ct Chest Wo Contrast  02/07/2016  CLINICAL DATA:  Shortness of breath weakness for 2 weeks EXAM: CT CHEST WITHOUT CONTRAST TECHNIQUE: Multidetector CT imaging of the chest was performed following the standard protocol without IV contrast. COMPARISON:  None. FINDINGS: THORACIC INLET/BODY WALL: Left breast skin thickening and asymmetric subcutaneous reticulation attributed to breast cancer treatment in this patient with axillary dissection and lumpectomy. MEDIASTINUM: Normal heart size. Moderate volume water density pericardial effusion. Atherosclerosis, including the coronary arteries. No acute vascular finding. No adenopathy. LUNG WINDOWS: Mild septal thickening. Mild dependent atelectasis. Small layering pleural effusions. No convincing pneumonia. UPPER ABDOMEN: No acute findings. OSSEOUS: No acute fracture.  No suspicious lytic or blastic lesions. IMPRESSION: Moderate pericardial and small bilateral  pleural effusions. Borderline pulmonary edema. Electronically Signed   By: Monte Fantasia M.D.   On: 02/07/2016 16:54   I have personally reviewed and evaluated these images and lab results as part of my medical decision-making.   EKG Interpretation   Date/Time:  Thursday February 07 2016 14:33:55 EDT Ventricular Rate:  78 PR Interval:  166 QRS Duration: 70 QT Interval:  394 QTC Calculation: 449 R Axis:   -56 Text Interpretation:  Normal sinus rhythm Left axis deviation Low voltage  QRS Septal infarct , age undetermined Abnormal ECG No significant change  since last tracing Confirmed by Winfred Leeds  MD, [facility] [phone]) on 02/07/2016  2:37:54 PM Also confirmed by Winfred Leeds  MD, [place] [phone]), editor [place] CT,  Leda Gauze [phone])  on 02/07/2016 2:59:20 PM      MDM   Final diagnoses:  SOB (shortness of breath)    Workup for the shortness of breath without any significant findings other that what was seen on the CT of the chest. Shows a moderate pericardial and small bilateral pleural effusions. This will require further evaluation by her doctor. Troponin was negative. No evidence of pneumonia. Labs without any significant abnormalities.  Patient's room air sats are 90-93%. We'll give a trial of albuterol inhaler although no wheezing. Patient does have a history of seasonal allergies and pollen has been heavy. Also patient will require close follow-up by her doctor for further investigation of why they pericardial fluid is there. Patient knows that that fluid gets worse or shortness of breath could get worse and she needs to return. Patient is to continue her Lasix.    Fredia Sorrow, MD 02/07/16 [phone]

## 2016-02-11 DIAGNOSIS — I129 Hypertensive chronic kidney disease with stage 1 through stage 4 chronic kidney disease, or unspecified chronic kidney disease: Secondary | ICD-10-CM | POA: Diagnosis not present

## 2016-02-11 DIAGNOSIS — E210 Primary hyperparathyroidism: Secondary | ICD-10-CM | POA: Diagnosis not present

## 2016-02-11 DIAGNOSIS — E559 Vitamin D deficiency, unspecified: Secondary | ICD-10-CM | POA: Diagnosis not present

## 2016-02-11 DIAGNOSIS — I10 Essential (primary) hypertension: Secondary | ICD-10-CM | POA: Diagnosis not present

## 2016-02-15 DIAGNOSIS — R0602 Shortness of breath: Secondary | ICD-10-CM | POA: Diagnosis not present

## 2016-02-15 DIAGNOSIS — Z23 Encounter for immunization: Secondary | ICD-10-CM | POA: Diagnosis not present

## 2016-02-15 DIAGNOSIS — I129 Hypertensive chronic kidney disease with stage 1 through stage 4 chronic kidney disease, or unspecified chronic kidney disease: Secondary | ICD-10-CM | POA: Diagnosis not present

## 2016-02-15 DIAGNOSIS — E559 Vitamin D deficiency, unspecified: Secondary | ICD-10-CM | POA: Diagnosis not present

## 2016-02-15 DIAGNOSIS — I313 Pericardial effusion (noninflammatory): Secondary | ICD-10-CM | POA: Diagnosis not present

## 2016-02-18 ENCOUNTER — Ambulatory Visit (INDEPENDENT_AMBULATORY_CARE_PROVIDER_SITE_OTHER): Payer: Medicare Other | Admitting: Cardiology

## 2016-02-18 ENCOUNTER — Encounter: Payer: Self-pay | Admitting: Cardiology

## 2016-02-18 VITALS — BP 130/90 | HR 78 | Ht 61.0 in | Wt 135.8 lb

## 2016-02-18 DIAGNOSIS — I319 Disease of pericardium, unspecified: Secondary | ICD-10-CM | POA: Diagnosis not present

## 2016-02-18 DIAGNOSIS — I313 Pericardial effusion (noninflammatory): Secondary | ICD-10-CM

## 2016-02-18 DIAGNOSIS — C50412 Malignant neoplasm of upper-outer quadrant of left female breast: Secondary | ICD-10-CM

## 2016-02-18 DIAGNOSIS — I3139 Other pericardial effusion (noninflammatory): Secondary | ICD-10-CM

## 2016-02-18 NOTE — Progress Notes (Signed)
Cardiology Office Note    Date:  02/18/2016   ID:  Carla Bennett, DOB [id number], MRN [id number]  PCP:  Thressa Sheller, MD  Cardiologist:   Candee Furbish, MD     History of Present Illness:  Carla Bennett is a 81 y.o. female here for evaluation of shortness of breath at the request of Dr. Noah Delaine.  CT scan was performed in the emergency room which showed a moderate pericardial effusion. Albuterol was also utilized but did not help with the situation. Worse with exertion. Lower extremity edema noted.  She had an echo performed on 03/13/15 which at that time showed no evidence of pericardial effusion. Mild mitral and aortic regurgitation noted. Normal ejection fraction. This was pre-chemotherapy to breast cancer.  Moderate-sized pericardial effusion was personally viewed on CT scan from 02/07/16. Aortic atherosclerosis and coronary artery atherosclerosis was also noted. Pleural effusions were also noted.  In general, she did feel weakness and fatigue surrounding her chemotherapy/radiation however in March for instance she was feeling much better from a dyspnea standpoint. Really did not have any significant issues with that she states. Now when she goes up a flight or 2 of stairs, she feels fairly short of breath. She denies any chest pain, syncope, bleeding, fevers, orthopnea, and explained weight loss, night sweats  Past Medical History  Diagnosis Date  . Diverticulosis of colon (without mention of hemorrhage)   . Benign neoplasm of colon   . Diverticulitis   . Arthritis   . Gout   . Hypertension   . Allergy   . Other issue of medical certificates     uterine myomata  . Hematuria     negative urology work up  . Hx of abnormal cervical Pap smear     LEEP-CIN I, negative margins and ECC  . Uterine prolaps   . Cystocele   . Basal cell carcinoma   . Hypercalcemia   . Cholelithiasis   . History of blood transfusion     x 1 with D&C  . Renal disease 3/16    Stage 3  kidney disease-seeing Dr Buddy Duty and Dr Marval Regal  . Breast cancer of upper-outer quadrant of left female breast ([place]) 02/23/2015  . Heart murmur   . Parathyroid abnormality ([place])   . Radiation 10/01/15-11/16/15    left breast axillary and supraclavicular reigion 45 gray, left breast 50.4 gray, lumpectomy cavity boost 10 gray    Past Surgical History  Procedure Laterality Date  . Cystocele repair  08/2010    Rectocele, vault prolapse  . Dilation and curettage of uterus      x2  . Tonsillectomy and adenoidectomy    . Cholecystectomy    . Total vaginal hysterectomy      secondary to prolapse, ovaries not removed  . Portacath placement Right 03/20/2015    Procedure: INSERTION PORT-A-CATH;  Surgeon: Erroll Luna, MD;  Location: [facility];  Service: General;  Laterality: Right;  . Radioactive seed guided mastectomy with axillary sentinel lymph node biopsy Left 08/23/2015    Procedure: LEFT BREAST PARTIAL MASTECTOMY WITH SENTINEL LYMPH NODE MAPPING;  Surgeon: Erroll Luna, MD;  Location: [facility];  Service: General;  Laterality: Left;  . Port-a-cath removal Right 08/23/2015    Procedure: REMOVAL PORT-A-CATH;  Surgeon: Erroll Luna, MD;  Location: [facility];  Service: General;  Laterality: Right;    Current Medications: Outpatient Prescriptions Prior to Visit  Medication Sig Dispense Refill  . B Complex Vitamins (VITAMIN B COMPLEX PO)  Take 1 tablet by mouth daily.     . furosemide (LASIX) 20 MG tablet Take 20 mg by mouth daily.     Marland Kitchen lisinopril (PRINIVIL,ZESTRIL) 40 MG tablet Take 40 mg by mouth daily.     Marland Kitchen loratadine (CLARITIN) 10 MG tablet Take 10 mg by mouth daily.    Marland Kitchen labetalol (NORMODYNE) 100 MG tablet Take 1 tablet (100 mg total) by mouth daily. (Patient taking differently: Take 100 mg by mouth daily. Pt takes 1/2 tablet daily= 50 mg) 30 tablet 0   Facility-Administered Medications Prior to Visit  Medication Dose Route Frequency Provider Last Rate Last  Dose  . sodium chloride 0.9 % injection 10 mL  10 mL Intracatheter PRN Nicholas Lose, MD   10 mL at 06/07/15 1559     Allergies:   Review of patient's allergies indicates no known allergies.   Social History   Social History  . Marital Status: Married    Spouse Name: N/A  . Number of Children: 4  . Years of Education: N/A   Occupational History  . retired    Social History Main Topics  . Smoking status: Never Smoker   . Smokeless tobacco: Never Used  . Alcohol Use: Yes     Comment: occasionally  . Drug Use: No  . Sexual Activity: No     Comment: TVH   Other Topics Concern  . None   Social History Narrative     Family History:  The patient's family history includes Alzheimer's disease in her mother; Breast cancer (age of onset: 62) in her sister; Cancer in her maternal uncle; Cancer (age of onset: 65) in her cousin and maternal uncle; Lung cancer in her mother; Lung cancer (age of onset: 50) in her father; Multiple sclerosis in her sister; Parkinson's disease in her mother; Prostate cancer (age of onset: 36) in her maternal uncle. There is no history of Colon cancer, Esophageal cancer, Rectal cancer, or Stomach cancer.   ROS:   Please see the history of present illness.    ROS All other systems reviewed and are negative.   PHYSICAL EXAM:   VS:  BP 130/90 mmHg  Pulse 78  Ht 5\' 1"  (1.549 m)  Wt 135 lb 12.8 oz (61.598 kg)  BMI 25.67 kg/m2  LMP 10/20/1996   GEN: Well nourished, well developed, in no acute distress HEENT: normal Neck: no significant JVD, carotid bruits, or masses Cardiac: RRR; no significant murmurs, rubs, or gallops,no edema  Respiratory:  clear to auscultation bilaterally, normal work of breathing GI: soft, nontender, nondistended, + BS MS: no deformity or atrophy Skin: warm and dry, no rash Neuro:  Alert and Oriented x 3, Strength and sensation are intact Psych: euthymic mood, full affect  Wt Readings from Last 3 Encounters:  02/18/16 135 lb 12.8  oz (61.598 kg)  02/07/16 133 lb (60.328 kg)  12/27/15 133 lb 9.6 oz (60.601 kg)      Studies/Labs Reviewed:   EKG:  EKG is not ordered today.  The ekg from 02/07/16 shows sinus rhythm, lower voltage, poor R-wave progression, no other significant abnormality.  Recent Labs: 05/24/2015: TSH 0.528 08/08/2015: ALT 14 02/07/2016: BUN 15; Creatinine, Ser 0.98; Hemoglobin 14.4; Platelets 286; Potassium 3.6; Sodium 137   Lipid Panel No results found for: CHOL, TRIG, HDL, CHOLHDL, VLDL, LDLCALC, LDLDIRECT  Additional studies/ records that were reviewed today include:  CT scan prior old notes, lab work, prior echocardiogram    ASSESSMENT:    1. Pericardial effusion  2. Breast cancer of upper-outer quadrant of left female breast ([place])      PLAN:  In order of problems listed above:  Pericardial effusion  - Seen on CT scan 02/07/16 moderate in size.  - I will go ahead and order an echocardiogram to see if there is any change. She also had bilateral pleural effusions as well.  - I wonder if this was a response to an inflammatory viral syndrome. Her breathing has not gotten worse. It may be slightly better but not as good as it was back in March.  - I would suspect that if the effusion was malignant, it would have progressed since April and caused significant changes in symptoms.  - If effusion is still present on echocardiogram, I will consider short course of anti-inflammatories such as ibuprofen. I'm aware of her creatinine of 1.3. She is following with Dr. Marval Regal.  Dyspnea  - Seems to have happened fairly acutely in late April. She thought originally that this was secondary to the pollen/allergies and upon going to urgent care, she was asked to go to the emergency room for further evaluation. CT scan was personally viewed as described above which showed a moderate pericardial effusion and bilateral pleural effusions. This was not a CT angiogram. Since then, she is feeling slightly better  but still her son is noticing shortness of breath when traversing stairs.  - Checking echocardiogram.  - No evidence of anemia.  - Prior radiation therapy however this was focused on breast cancer.  Aortic atherosclerosis, mild coronary atherosclerosis seen on CT  - Secondary prevention.    Medication Adjustments/Labs and Tests Ordered: Current medicines are reviewed at length with the patient today.  Concerns regarding medicines are outlined above.  Medication changes, Labs and Tests ordered today are listed in the Patient Instructions below. Patient Instructions  Medication Instructions:  The current medical regimen is effective;  continue present plan and medications.  Testing/Procedures: Your physician has requested that you have an echocardiogram. Echocardiography is a painless test that uses sound waves to create images of your heart. It provides your doctor with information about the size and shape of your heart and how well your heart's chambers and valves are working. This procedure takes approximately one hour. There are no restrictions for this procedure.  Follow-Up: Follow up in 1 month with Dr Marlou Porch.  If you need a refill on your cardiac medications before your next appointment, please call your pharmacy.  Thank you for choosing [facility]!!          Signed, Candee Furbish, MD  02/18/2016 2:32 PM    [facility] [address], [place], [place]  [id number] Phone: [phone]; Fax: [phone]

## 2016-02-18 NOTE — Patient Instructions (Signed)
Medication Instructions:  The current medical regimen is effective;  continue present plan and medications.  Testing/Procedures: Your physician has requested that you have an echocardiogram. Echocardiography is a painless test that uses sound waves to create images of your heart. It provides your doctor with information about the size and shape of your heart and how well your heart's chambers and valves are working. This procedure takes approximately one hour. There are no restrictions for this procedure.  Follow-Up: Follow up in 1 month with Dr Marlou Porch.  If you need a refill on your cardiac medications before your next appointment, please call your pharmacy.  Thank you for choosing [facility]!!

## 2016-02-21 ENCOUNTER — Ambulatory Visit (HOSPITAL_BASED_OUTPATIENT_CLINIC_OR_DEPARTMENT_OTHER)
Admission: RE | Admit: 2016-02-21 | Discharge: 2016-02-21 | Disposition: A | Discharge status: Home / Self Care | Payer: Medicare Other | Source: Ambulatory Visit | Attending: Cardiology | Admitting: Cardiology

## 2016-02-21 DIAGNOSIS — I313 Pericardial effusion (noninflammatory): Secondary | ICD-10-CM | POA: Diagnosis not present

## 2016-02-21 DIAGNOSIS — N189 Chronic kidney disease, unspecified: Secondary | ICD-10-CM | POA: Diagnosis not present

## 2016-02-21 DIAGNOSIS — I340 Nonrheumatic mitral (valve) insufficiency: Secondary | ICD-10-CM | POA: Diagnosis not present

## 2016-02-21 DIAGNOSIS — I1310 Hypertensive heart and chronic kidney disease without heart failure, with stage 1 through stage 4 chronic kidney disease, or unspecified chronic kidney disease: Secondary | ICD-10-CM | POA: Diagnosis not present

## 2016-02-21 DIAGNOSIS — I319 Disease of pericardium, unspecified: Secondary | ICD-10-CM | POA: Diagnosis not present

## 2016-02-21 DIAGNOSIS — J90 Pleural effusion, not elsewhere classified: Secondary | ICD-10-CM | POA: Diagnosis not present

## 2016-02-21 DIAGNOSIS — I3139 Other pericardial effusion (noninflammatory): Secondary | ICD-10-CM

## 2016-02-21 DIAGNOSIS — I071 Rheumatic tricuspid insufficiency: Secondary | ICD-10-CM | POA: Diagnosis not present

## 2016-02-21 DIAGNOSIS — I351 Nonrheumatic aortic (valve) insufficiency: Secondary | ICD-10-CM | POA: Diagnosis not present

## 2016-02-21 NOTE — Progress Notes (Signed)
  Echocardiogram 2D Echocardiogram has been performed.  Carla Bennett M 02/21/2016, 2:50 PM

## 2016-02-22 ENCOUNTER — Telehealth: Payer: Self-pay | Admitting: Cardiology

## 2016-02-22 DIAGNOSIS — I10 Essential (primary) hypertension: Secondary | ICD-10-CM

## 2016-02-22 MED ORDER — CARVEDILOL 6.25 MG PO TABS: 6.2500 mg | ORAL_TABLET | Freq: Two times a day (BID) | ORAL | Status: DC

## 2016-02-22 MED ORDER — POTASSIUM CHLORIDE CRYS ER 20 MEQ PO TBCR: 20.0000 meq | EXTENDED_RELEASE_TABLET | Freq: Two times a day (BID) | ORAL | Status: DC

## 2016-02-22 MED ORDER — FUROSEMIDE 40 MG PO TABS
40.0000 mg | ORAL_TABLET | Freq: Two times a day (BID) | ORAL | Status: DC
Start: 2016-02-22 — End: 2016-02-27

## 2016-02-22 NOTE — Telephone Encounter (Signed)
Spoke with pt and she states that she spoke with Dr. Marlou Porch yesterday and he reviewed echo and instructions with her. Went over medication changes again with pt.  Scheduled appt for Tuesday, 5/9, with Richardson Dopp, PA-C. Advised pt that she will have labs drawn the same day as appt. Pt verbalized understanding of new instructions and was in agreement with this plan. Verified pharmacy and sent over new prescriptions.

## 2016-02-22 NOTE — Telephone Encounter (Signed)
New message      Pt states Dr Marlou Porch called her last night and told her someone would be calling her today regarding her coreg presc.  Please call

## 2016-02-25 NOTE — Progress Notes (Signed)
Cardiology Office Note:    Date:  02/26/2016   ID:  Carlis Stable [place]ou[place], DOB [id number], MRN [id number]  PCP:  Thressa Sheller, MD  Cardiologist:  Dr. Candee Furbish   Electrophysiologist:  N/a Oncology: Dr. Lindi Adie Nephrology: Dr. Marval Regal  Referring MD: Thressa Sheller, MD   Chief Complaint  Patient presents with  . Congestive Heart Failure    Follow up    History of Present Illness:     Carla Bennett is a 81 y.o. female with a hx of HTN, CKD, breast CA.  Breast CA treated with lumpectomy, chemotherapy (6/16-10/16), radiation.  Chemotherapy agents included Adriamycin (Doxorubicin - Anthracycline class), Cytoxan followed by Abraxane.     Seen in ED in 4/17 with dyspnea.  CT of the chest demonstrated mod pericardial effusion and small bilateral pleural effusions, borderline pulmonary edema.  Of note, there was coronary calcification noted as well.  Evaluated by Dr. Candee Furbish 02/18/16.  Echocardiogram was obtained. This demonstrated worsening LV function with an EF of 20-25%, diffuse HK, severe diastolic dysfunction, moderate AI, moderate to severe MR, biatrial enlargement, PASP 53 mmHg. Pericardial effusion was felt to be small. There was a moderate left pleural effusion. Dr. Marlou Porch adjusted diuresis by increasing Lasix and potassium and changed labetalol to carvedilol. Pericardial effusion appeared to have improved when compared CT scan. She returns for follow-up.  Here with her son.  She is feeling much better.  She notes much less DOE.  She is NYHA 2. She denies orthopnea, PND, edema, syncope, chest pain. Cough is improved.    Past Medical History  Diagnosis Date  . Diverticulosis of colon (without mention of hemorrhage)   . Benign neoplasm of colon   . Diverticulitis   . Arthritis   . Gout   . Hypertension   . Allergy   . Other issue of medical certificates     uterine myomata  . Hematuria     negative urology work up  . Hx of abnormal cervical Pap smear     LEEP-CIN  I, negative margins and ECC  . Uterine prolaps   . Cystocele   . Basal cell carcinoma   . Hypercalcemia   . Cholelithiasis   . History of blood transfusion     x 1 with D&C  . Renal disease 3/16    Stage 3 kidney disease-seeing Dr Buddy Duty and Dr Marval Regal  . Breast cancer of upper-outer quadrant of left female breast ([place]) 02/23/2015  . Heart murmur   . Parathyroid abnormality ([place])   . Radiation 10/01/15-11/16/15    left breast axillary and supraclavicular reigion 45 gray, left breast 50.4 gray, lumpectomy cavity boost 10 gray    Past Surgical History  Procedure Laterality Date  . Cystocele repair  08/2010    Rectocele, vault prolapse  . Dilation and curettage of uterus      x2  . Tonsillectomy and adenoidectomy    . Cholecystectomy    . Total vaginal hysterectomy      secondary to prolapse, ovaries not removed  . Portacath placement Right 03/20/2015    Procedure: INSERTION PORT-A-CATH;  Surgeon: Erroll Luna, MD;  Location: [place];  Service: General;  Laterality: Right;  . Radioactive seed guided mastectomy with axillary sentinel lymph node biopsy Left 08/23/2015    Procedure: LEFT BREAST PARTIAL MASTECTOMY WITH SENTINEL LYMPH NODE MAPPING;  Surgeon: Erroll Luna, MD;  Location: [place];  Service: General;  Laterality: Left;  . Port-a-cath removal Right 08/23/2015    Procedure:  REMOVAL PORT-A-CATH;  Surgeon: Erroll Luna, MD;  Location: [facility];  Service: General;  Laterality: Right;    Current Medications: Outpatient Prescriptions Prior to Visit  Medication Sig Dispense Refill  . B Complex Vitamins (VITAMIN B COMPLEX PO) Take 1 tablet by mouth daily.     . furosemide (LASIX) 40 MG tablet Take 1 tablet (40 mg total) by mouth 2 (two) times daily. 180 tablet 3  . lisinopril (PRINIVIL,ZESTRIL) 40 MG tablet Take 40 mg by mouth daily.     Marland Kitchen loratadine (CLARITIN) 10 MG tablet Take 10 mg by mouth daily.    . potassium chloride SA (K-DUR,KLOR-CON)  20 MEQ tablet Take 1 tablet (20 mEq total) by mouth 2 (two) times daily. 180 tablet 3  . carvedilol (COREG) 6.25 MG tablet Take 1 tablet (6.25 mg total) by mouth 2 (two) times daily. 180 tablet 3   Facility-Administered Medications Prior to Visit  Medication Dose Route Frequency Provider Last Rate Last Dose  . sodium chloride 0.9 % injection 10 mL  10 mL Intracatheter PRN Nicholas Lose, MD   10 mL at 06/07/15 1559      Allergies:   Review of patient's allergies indicates no known allergies.   Social History   Social History  . Marital Status: Married    Spouse Name: N/A  . Number of Children: 4  . Years of Education: N/A   Occupational History  . retired    Social History Main Topics  . Smoking status: Never Smoker   . Smokeless tobacco: Never Used  . Alcohol Use: Yes     Comment: occasionally  . Drug Use: No  . Sexual Activity: No     Comment: TVH   Other Topics Concern  . None   Social History Narrative     Family History:  The patient's family history includes Alzheimer's disease in her mother; Breast cancer (age of onset: 75) in her sister; Cancer in her maternal uncle; Cancer (age of onset: 96) in her cousin and maternal uncle; Lung cancer in her mother; Lung cancer (age of onset: 77) in her father; Multiple sclerosis in her sister; Parkinson's disease in her mother; Prostate cancer (age of onset: 52) in her maternal uncle. There is no history of Colon cancer, Esophageal cancer, Rectal cancer, or Stomach cancer.   ROS:   Please see the history of present illness.    ROS All other systems reviewed and are negative.   Physical Exam:    VS:  BP 158/60 mmHg  Pulse 88  Ht 5\' 1"  (1.549 m)  Wt 128 lb 3.2 oz (58.151 kg)  BMI 24.24 kg/m2  SpO2 97%  LMP 10/20/1996   GEN: Well nourished, well developed, in no acute distress HEENT: normal Neck: no JVD, no masses Cardiac: Normal S1/S2, RRR; no murmurs, rubs, or gallops, no edema;     Respiratory:  clear to auscultation  bilaterally; no wheezing, rhonchi or rales; no egophony  GI: soft, nontender, nondistended MS: no deformity or atrophy Skin: warm and dry Neuro: No focal deficits  Psych: Alert and oriented x 3, normal affect  Wt Readings from Last 3 Encounters:  02/26/16 128 lb 3.2 oz (58.151 kg)  02/18/16 135 lb 12.8 oz (61.598 kg)  02/07/16 133 lb (60.328 kg)      Studies/Labs Reviewed:     EKG:  EKG is not ordered today.  The ekg ordered today demonstrates n/a  Recent Labs: 05/24/2015: TSH 0.528 08/08/2015: ALT 14 02/07/2016: BUN 15; Creatinine,  Ser 0.98; Hemoglobin 14.4; Platelets 286; Potassium 3.6; Sodium 137   Recent Lipid Panel No results found for: CHOL, TRIG, HDL, CHOLHDL, VLDL, LDLCALC, LDLDIRECT  Additional studies/ records that were reviewed today include:   Echo 02/21/16 Mild LVH, EF 20-25%, diff HK, Gr 3 DD, mod AI, mod to severe MR, mod LAE, mod redcued RVSF, mild RAE, mod TR, PASP 53 mmHg, small pericardial effusion, mod L pleural effusion  Echo 5/16 Mild LVH, EF 55-60%, no RWMA, mild AI, mild MR, mild LAE, PASP 40 mmHg   ASSESSMENT:     1. Chronic systolic CHF (congestive heart failure) ([place])   2. Dilated cardiomyopathy ([place])   3. Pericardial effusion   4. Pleural effusion, left   5. Essential hypertension   6. CKD (chronic kidney disease), stage 2 (mild)   7. Breast cancer of upper-outer quadrant of left female breast ([place])     PLAN:     In order of problems listed above:  1. Chronic systolic CHF - New dx.  Her EF is 20-25%.  Since increasing her Lasix, she feels much better.  Now, she is NYHA 2.  D/w Dr. Candee Furbish.  Will try to advance CHF treatment and repeat a limited echo in a few weeks to recheck EF.  If EF is not getting better, she will likely need R/L heart cath.  Continue ACE inhibitor.  Increase Coreg to 12.5 mg bid.  Continue current dose of Lasix.  Check BMET today.  Consider adding Spironolactone at FU.    2. DCM -  Question if related to Anthracycline  used for chemotherapy for breast CA.  Continue beta-blocker, ACE inhibitor.  As noted, plan repeat limited echo in a few weeks.  If EF no better, will need cardiac cath (coronary calcification on CT).    3. Pericardial Effusion -  Smaller on echo.     4. L Pleural effusion - No egophony on exam.  She can lay flat.  Continue diuresis.  At this point, I do not think she needs a CXR.  5. HTN - BP elevated.  Increase coreg as noted.   6. CKD - Creatinine has been fairly stable (0.98-1.1) since 9/16.  Lasix dose increased last week.  Repeat BMET today.    7. Breast CA - FU with Oncology as planned.    Medication Adjustments/Labs and Tests Ordered: Current medicines are reviewed at length with the patient today.  Concerns regarding medicines are outlined above.  Medication changes, Labs and Tests ordered today are outlined in the Patient Instructions noted below. Patient Instructions  Medication Instructions:  1. INCREASE COREG TO 12.5 MG 1 TABLET TWICE DAILY; NEW RX SENT IN TODAY Labwork: 1. TODAY BMET Testing/Procedures: NONE Follow-Up: 03/14/16 @ 12:10 WITH Alyx Gee, PAC  Any Other Special Instructions Will Be Listed Below (If Applicable). If you need a refill on your cardiac medications before your next appointment, please call your pharmacy.    Signed, Richardson Dopp, PA-C  02/26/2016 4:09 PM    [facility] [place], [place], [place]  [address] Phone: [phone]; Fax: [phone]

## 2016-02-26 ENCOUNTER — Encounter: Payer: Self-pay | Admitting: Physician Assistant

## 2016-02-26 ENCOUNTER — Ambulatory Visit (INDEPENDENT_AMBULATORY_CARE_PROVIDER_SITE_OTHER): Payer: Medicare Other | Admitting: Physician Assistant

## 2016-02-26 ENCOUNTER — Other Ambulatory Visit: Payer: Medicare Other

## 2016-02-26 VITALS — BP 158/60 | HR 88 | Ht 61.0 in | Wt 128.2 lb

## 2016-02-26 DIAGNOSIS — C50412 Malignant neoplasm of upper-outer quadrant of left female breast: Secondary | ICD-10-CM

## 2016-02-26 DIAGNOSIS — J90 Pleural effusion, not elsewhere classified: Secondary | ICD-10-CM

## 2016-02-26 DIAGNOSIS — N189 Chronic kidney disease, unspecified: Secondary | ICD-10-CM | POA: Insufficient documentation

## 2016-02-26 DIAGNOSIS — J948 Other specified pleural conditions: Secondary | ICD-10-CM

## 2016-02-26 DIAGNOSIS — I5022 Chronic systolic (congestive) heart failure: Secondary | ICD-10-CM | POA: Diagnosis not present

## 2016-02-26 DIAGNOSIS — I420 Dilated cardiomyopathy: Secondary | ICD-10-CM | POA: Diagnosis not present

## 2016-02-26 DIAGNOSIS — I319 Disease of pericardium, unspecified: Secondary | ICD-10-CM

## 2016-02-26 DIAGNOSIS — N183 Chronic kidney disease, stage 3 unspecified: Secondary | ICD-10-CM | POA: Insufficient documentation

## 2016-02-26 DIAGNOSIS — N182 Chronic kidney disease, stage 2 (mild): Secondary | ICD-10-CM

## 2016-02-26 DIAGNOSIS — I10 Essential (primary) hypertension: Secondary | ICD-10-CM

## 2016-02-26 DIAGNOSIS — I313 Pericardial effusion (noninflammatory): Secondary | ICD-10-CM | POA: Insufficient documentation

## 2016-02-26 DIAGNOSIS — I3139 Other pericardial effusion (noninflammatory): Secondary | ICD-10-CM | POA: Insufficient documentation

## 2016-02-26 DIAGNOSIS — I509 Heart failure, unspecified: Secondary | ICD-10-CM | POA: Diagnosis not present

## 2016-02-26 HISTORY — DX: Chronic systolic (congestive) heart failure: I50.22

## 2016-02-26 MED ORDER — CARVEDILOL 12.5 MG PO TABS: 12.5000 mg | ORAL_TABLET | Freq: Two times a day (BID) | ORAL | Status: DC

## 2016-02-26 NOTE — Patient Instructions (Addendum)
Medication Instructions:  1. INCREASE COREG TO 12.5 MG 1 TABLET TWICE DAILY; NEW RX SENT IN TODAY Labwork: 1. TODAY BMET Testing/Procedures: NONE Follow-Up: 03/14/16 @ 12:10 WITH SCOTT WEAVER, PAC  Any Other Special Instructions Will Be Listed Below (If Applicable). If you need a refill on your cardiac medications before your next appointment, please call your pharmacy.

## 2016-02-27 ENCOUNTER — Other Ambulatory Visit: Payer: Self-pay | Admitting: *Deleted

## 2016-02-27 DIAGNOSIS — I10 Essential (primary) hypertension: Secondary | ICD-10-CM | POA: Diagnosis not present

## 2016-02-27 DIAGNOSIS — N183 Chronic kidney disease, stage 3 (moderate): Secondary | ICD-10-CM | POA: Diagnosis not present

## 2016-02-27 DIAGNOSIS — I313 Pericardial effusion (noninflammatory): Secondary | ICD-10-CM | POA: Diagnosis not present

## 2016-02-27 DIAGNOSIS — I5022 Chronic systolic (congestive) heart failure: Secondary | ICD-10-CM

## 2016-02-27 DIAGNOSIS — I429 Cardiomyopathy, unspecified: Secondary | ICD-10-CM | POA: Diagnosis not present

## 2016-02-27 DIAGNOSIS — I420 Dilated cardiomyopathy: Secondary | ICD-10-CM

## 2016-02-27 DIAGNOSIS — C50919 Malignant neoplasm of unspecified site of unspecified female breast: Secondary | ICD-10-CM | POA: Diagnosis not present

## 2016-02-27 DIAGNOSIS — E210 Primary hyperparathyroidism: Secondary | ICD-10-CM | POA: Diagnosis not present

## 2016-02-27 DIAGNOSIS — N2581 Secondary hyperparathyroidism of renal origin: Secondary | ICD-10-CM | POA: Diagnosis not present

## 2016-02-27 LAB — BASIC METABOLIC PANEL
BUN: 14 mg/dL (ref 7–25)
CHLORIDE: 101 mmol/L (ref 98–110)
CO2: 26 mmol/L (ref 20–31)
Calcium: 11.2 mg/dL — ABNORMAL HIGH (ref 8.6–10.4)
Creat: 1.12 mg/dL — ABNORMAL HIGH (ref 0.60–0.93)
Glucose, Bld: 92 mg/dL (ref 65–99)
POTASSIUM: 4.2 mmol/L (ref 3.5–5.3)
Sodium: 139 mmol/L (ref 135–146)

## 2016-02-27 MED ORDER — FUROSEMIDE 40 MG PO TABS: ORAL_TABLET | ORAL | Status: DC

## 2016-02-27 MED ORDER — POTASSIUM CHLORIDE CRYS ER 20 MEQ PO TBCR: EXTENDED_RELEASE_TABLET | ORAL | Status: DC

## 2016-02-28 ENCOUNTER — Other Ambulatory Visit (HOSPITAL_COMMUNITY): Payer: Medicare Other

## 2016-03-04 ENCOUNTER — Encounter: Payer: Self-pay | Admitting: Adult Health

## 2016-03-04 NOTE — Progress Notes (Signed)
A birthday card was mailed to the patient today on behalf of the Survivorship Program at [facility].   Jaelin Devincentis, NP Survivorship Program [facility] [phone]  

## 2016-03-06 ENCOUNTER — Other Ambulatory Visit: Payer: Self-pay | Admitting: Nephrology

## 2016-03-06 DIAGNOSIS — E210 Primary hyperparathyroidism: Secondary | ICD-10-CM

## 2016-03-07 ENCOUNTER — Other Ambulatory Visit (INDEPENDENT_AMBULATORY_CARE_PROVIDER_SITE_OTHER): Payer: Medicare Other | Admitting: *Deleted

## 2016-03-07 DIAGNOSIS — I5022 Chronic systolic (congestive) heart failure: Secondary | ICD-10-CM

## 2016-03-07 DIAGNOSIS — I428 Other cardiomyopathies: Secondary | ICD-10-CM | POA: Diagnosis not present

## 2016-03-07 DIAGNOSIS — I420 Dilated cardiomyopathy: Secondary | ICD-10-CM

## 2016-03-07 DIAGNOSIS — I509 Heart failure, unspecified: Secondary | ICD-10-CM | POA: Diagnosis not present

## 2016-03-07 LAB — BASIC METABOLIC PANEL
BUN: 21 mg/dL (ref 7–25)
CALCIUM: 10.8 mg/dL — AB (ref 8.6–10.4)
CO2: 29 mmol/L (ref 20–31)
Chloride: 101 mmol/L (ref 98–110)
Creat: 1.04 mg/dL — ABNORMAL HIGH (ref 0.60–0.93)
GLUCOSE: 81 mg/dL (ref 65–99)
POTASSIUM: 4.2 mmol/L (ref 3.5–5.3)
SODIUM: 141 mmol/L (ref 135–146)

## 2016-03-10 ENCOUNTER — Telehealth: Payer: Self-pay | Admitting: *Deleted

## 2016-03-10 NOTE — Telephone Encounter (Signed)
Pt notified of lab results and findings of elevated calcium level. Advised to f/u w/PCP about calciu level. Pt states she is having a parathyroid [place] 5/24. Pt asked for me to fax results to PCP and Dr. Quincy Simmonds.

## 2016-03-12 ENCOUNTER — Ambulatory Visit
Admission: RE | Admit: 2016-03-12 | Discharge: 2016-03-12 | Disposition: A | Discharge status: Home / Self Care | Payer: Medicare Other | Source: Ambulatory Visit | Attending: Nephrology | Admitting: Nephrology

## 2016-03-12 DIAGNOSIS — E042 Nontoxic multinodular goiter: Secondary | ICD-10-CM | POA: Diagnosis not present

## 2016-03-12 DIAGNOSIS — E210 Primary hyperparathyroidism: Secondary | ICD-10-CM

## 2016-03-13 NOTE — Progress Notes (Signed)
Cardiology Office Note:    Date:  03/14/2016   ID:  Carla Bennett, DOB [id number], MRN [id number]  PCP:  Thressa Sheller, MD  Cardiologist:  Dr. Candee Furbish   Electrophysiologist:  N/a Oncology: Dr. Lindi Adie Nephrology: Dr. Marval Regal  Referring MD: Thressa Sheller, MD   Chief Complaint  Patient presents with  . Congestive Heart Failure    follow up    History of Present Illness:     Carla Bennett is a 81 y.o. female with a hx of HTN, CKD, breast CA.  Breast CA treated with lumpectomy, chemotherapy (6/16-10/16), radiation.  Chemotherapy agents included Adriamycin (Doxorubicin - Anthracycline class), Cytoxan followed by Abraxane.     Seen in ED in 4/17 with dyspnea.  CT of the chest demonstrated mod pericardial effusion and small bilateral pleural effusions, borderline pulmonary edema.  Of note, there was coronary calcification noted as well.  Echo in 5/17 demonstrated worsening LV function with an EF of 20-25%, diffuse HK, severe diastolic dysfunction, moderate AI, moderate to severe MR, biatrial enlargement, PASP 53 mmHg. Pericardial effusion was felt to be small.  She was placed on Lasix and HF meds were adjusted.   Last seen 02/26/16.  She returns for FU.    Seen by Neph and Cr on 02/27/16 was 1.22.    Returns for FU.  She is doing well.  Denies dyspnea, chest pain, orthopnea, PND, edema, syncope.  No cough.     Past Medical History  Diagnosis Date  . Diverticulosis of colon (without mention of hemorrhage)   . Benign neoplasm of colon   . Diverticulitis   . Arthritis   . Gout   . Hypertension   . Allergy   . Other issue of medical certificates     uterine myomata  . Hematuria     negative urology work up  . Hx of abnormal cervical Pap smear     LEEP-CIN I, negative margins and ECC  . Uterine prolaps   . Cystocele   . Basal cell carcinoma   . Hypercalcemia   . Cholelithiasis   . History of blood transfusion     x 1 with D&C  . Renal disease 3/16   Stage 3 kidney disease-seeing Dr Buddy Duty and Dr Marval Regal  . Breast cancer of upper-outer quadrant of left female breast ([place]) 02/23/2015  . Heart murmur   . Parathyroid abnormality ([place])   . Radiation 10/01/15-11/16/15    left breast axillary and supraclavicular reigion 45 gray, left breast 50.4 gray, lumpectomy cavity boost 10 gray    Past Surgical History  Procedure Laterality Date  . Cystocele repair  08/2010    Rectocele, vault prolapse  . Dilation and curettage of uterus      x2  . Tonsillectomy and adenoidectomy    . Cholecystectomy    . Total vaginal hysterectomy      secondary to prolapse, ovaries not removed  . Portacath placement Right 03/20/2015    Procedure: INSERTION PORT-A-CATH;  Surgeon: Erroll Luna, MD;  Location: [place];  Service: General;  Laterality: Right;  . Radioactive seed guided mastectomy with axillary sentinel lymph node biopsy Left 08/23/2015    Procedure: LEFT BREAST PARTIAL MASTECTOMY WITH SENTINEL LYMPH NODE MAPPING;  Surgeon: Erroll Luna, MD;  Location: [facility];  Service: General;  Laterality: Left;  . Port-a-cath removal Right 08/23/2015    Procedure: REMOVAL PORT-A-CATH;  Surgeon: Erroll Luna, MD;  Location: [place];  Service: General;  Laterality: Right;  Current Medications: Outpatient Prescriptions Prior to Visit  Medication Sig Dispense Refill  . B Complex Vitamins (VITAMIN B COMPLEX PO) Take 1 tablet by mouth daily.     . carvedilol (COREG) 12.5 MG tablet Take 1 tablet (12.5 mg total) by mouth 2 (two) times daily. 180 tablet 3  . furosemide (LASIX) 40 MG tablet Take one tablet every morning and half tablet every afternoon 135 tablet 3  . lisinopril (PRINIVIL,ZESTRIL) 40 MG tablet Take 40 mg by mouth daily.     Marland Kitchen loratadine (CLARITIN) 10 MG tablet Take 10 mg by mouth daily.    . potassium chloride SA (K-DUR,KLOR-CON) 20 MEQ tablet Take one tablet every morning and half tablet every afternoon 135 tablet 3    Facility-Administered Medications Prior to Visit  Medication Dose Route Frequency Provider Last Rate Last Dose  . sodium chloride 0.9 % injection 10 mL  10 mL Intracatheter PRN Nicholas Lose, MD   10 mL at 06/07/15 1559      Allergies:   Review of patient's allergies indicates no known allergies.   Social History   Social History  . Marital Status: Married    Spouse Name: N/A  . Number of Children: 4  . Years of Education: N/A   Occupational History  . retired    Social History Main Topics  . Smoking status: Never Smoker   . Smokeless tobacco: Never Used  . Alcohol Use: Yes     Comment: occasionally  . Drug Use: No  . Sexual Activity: No     Comment: TVH   Other Topics Concern  . None   Social History Narrative     Family History:  The patient's family history includes Alzheimer's disease in her mother; Breast cancer (age of onset: 16) in her sister; Cancer in her maternal uncle; Cancer (age of onset: 31) in her cousin and maternal uncle; Lung cancer in her mother; Lung cancer (age of onset: 16) in her father; Multiple sclerosis in her sister; Parkinson's disease in her mother; Prostate cancer (age of onset: 31) in her maternal uncle. There is no history of Colon cancer, Esophageal cancer, Rectal cancer, or Stomach cancer.   ROS:   Please see the history of present illness.    ROS All other systems reviewed and are negative.   Physical Exam:    VS:  BP 120/70 mmHg  Pulse 71  Ht 5\' 1"  (1.549 m)  Wt 127 lb (57.607 kg)  BMI 24.01 kg/m2  LMP 10/20/1996   GEN: Well nourished, well developed, in no acute distress HEENT: normal Neck: no JVD, no masses Cardiac: Normal S1/S2, RRR; no murmurs, rubs, or gallops, no edema;     Respiratory:  clear to auscultation bilaterally; no wheezing, rhonchi or rales;  GI: soft, nontender, nondistended MS: no deformity or atrophy Skin: warm and dry Neuro: No focal deficits  Psych: Alert and oriented x 3, normal affect  Wt  Readings from Last 3 Encounters:  03/14/16 127 lb (57.607 kg)  02/26/16 128 lb 3.2 oz (58.151 kg)  02/18/16 135 lb 12.8 oz (61.598 kg)      Studies/Labs Reviewed:     EKG:  EKG is  ordered today.  The ekg ordered today demonstrates NSR, HR 71, LAD, septal Q waves, QTc 425 ms, no changes  Recent Labs: 05/24/2015: TSH 0.528 08/08/2015: ALT 14 02/07/2016: Hemoglobin 14.4; Platelets 286 03/07/2016: BUN 21; Creat 1.04*; Potassium 4.2; Sodium 141   Recent Lipid Panel No results found for: CHOL, TRIG, HDL,  CHOLHDL, VLDL, LDLCALC, LDLDIRECT  Additional studies/ records that were reviewed today include:   Echo 02/21/16 Mild LVH, EF 20-25%, diff HK, Gr 3 DD, mod AI, mod to severe MR, mod LAE, mod redcued RVSF, mild RAE, mod TR, PASP 53 mmHg, small pericardial effusion, mod L pleural effusion  Echo 5/16 Mild LVH, EF 55-60%, no RWMA, mild AI, mild MR, mild LAE, PASP 40 mmHg   ASSESSMENT:     1. Chronic systolic CHF (congestive heart failure) ([place])   2. Dilated cardiomyopathy ([place])   3. Pericardial effusion   4. Essential hypertension   5. CKD (chronic kidney disease), stage 2 (mild)   6. Breast cancer of upper-outer quadrant of left female breast ([place])     PLAN:     In order of problems listed above:  1. Chronic systolic CHF - NYHA 2.  Her EF is 20-25%.  Continue Lisinopril 40 QD, Coreg to 12.5 mg bid.  Will advance HF treatment by adding Spironolactone.  Will plan on repeat echo in several weeks to recheck EF.  If EF remains low, will need to consider cardiac cath.  -  DC K+  -  Spiro 12.5 mg QD  -  BMET q week x 2 weeks  -  Limited echo in 3 weeks  -  FU with Dr. Candee Furbish or me in 4 weeks  2. DCM -  Question if related to Anthracycline used for chemotherapy for breast CA.  Continue beta-blocker, ACE inhibitor.  Add Arlyce Harman.   Repeat echo in 3 weeks.   3. Pericardial Effusion -  Smaller on recent echo.     4. HTN - BP controlled.    5. CKD - Creatinine has been fairly stable  (0.98-1.1) since 9/16.   Will monitor renal fxn and K+ closely with add'n of Spiro.    6. Breast CA - FU with Oncology as planned.    Medication Adjustments/Labs and Tests Ordered: Current medicines are reviewed at length with the patient today.  Concerns regarding medicines are outlined above.  Medication changes, Labs and Tests ordered today are outlined in the Patient Instructions noted below. Patient Instructions  Medication Instructions:  1. STOP POTASSIUM 2. START SPIRONOLACTONE 25 MG TABLET WITH THE DIRECTIONS TO TAKE 1/2 TABLET = 12.5 MG DAILY; RX SENT  Labwork: 1. BMET TO BE DONE 5/31 AT [facility] 2. BMET TO BE DONE 6/7 AT [facility] Testing/Procedures: Your physician has requested that you have an echocardiogram. Echocardiography is a painless test that uses sound waves to create images of your heart. It provides your doctor with information about the size and shape of your heart and how well your heart's chambers and valves are working. This procedure takes approximately one hour. There are no restrictions for this procedure. THIS IS TO BE DONE IN 3 WEEKS AT THE [facility] Follow-Up: 4 WEEKS WITH SCOTT WEAVER, PAC SAME DAY DR. Marlou Porch IS IN THE OFFICE Any Other Special Instructions Will Be Listed Below (If Applicable). If you need a refill on your cardiac medications before your next appointment, please call your pharmacy.   Signed, Richardson Dopp, PA-C  03/14/2016 1:05 PM    [facility] [place], [place], [place]  [address] Phone: [phone]; Fax: [phone]

## 2016-03-14 ENCOUNTER — Ambulatory Visit (INDEPENDENT_AMBULATORY_CARE_PROVIDER_SITE_OTHER): Payer: Medicare Other | Admitting: Physician Assistant

## 2016-03-14 ENCOUNTER — Encounter: Payer: Self-pay | Admitting: Physician Assistant

## 2016-03-14 VITALS — BP 120/70 | HR 71 | Ht 61.0 in | Wt 127.0 lb

## 2016-03-14 DIAGNOSIS — I5022 Chronic systolic (congestive) heart failure: Secondary | ICD-10-CM | POA: Diagnosis not present

## 2016-03-14 DIAGNOSIS — I420 Dilated cardiomyopathy: Secondary | ICD-10-CM | POA: Diagnosis not present

## 2016-03-14 DIAGNOSIS — I10 Essential (primary) hypertension: Secondary | ICD-10-CM | POA: Diagnosis not present

## 2016-03-14 DIAGNOSIS — N182 Chronic kidney disease, stage 2 (mild): Secondary | ICD-10-CM

## 2016-03-14 DIAGNOSIS — I3139 Other pericardial effusion (noninflammatory): Secondary | ICD-10-CM

## 2016-03-14 DIAGNOSIS — I313 Pericardial effusion (noninflammatory): Secondary | ICD-10-CM

## 2016-03-14 DIAGNOSIS — C50412 Malignant neoplasm of upper-outer quadrant of left female breast: Secondary | ICD-10-CM

## 2016-03-14 DIAGNOSIS — I319 Disease of pericardium, unspecified: Secondary | ICD-10-CM | POA: Diagnosis not present

## 2016-03-14 MED ORDER — SPIRONOLACTONE 25 MG PO TABS: 12.5000 mg | ORAL_TABLET | Freq: Every day | ORAL | Status: DC

## 2016-03-14 NOTE — Patient Instructions (Addendum)
Medication Instructions:  1. STOP POTASSIUM 2. START SPIRONOLACTONE 25 MG TABLET WITH THE DIRECTIONS TO TAKE 1/2 TABLET = 12.5 MG DAILY; RX SENT  Labwork: 1. BMET TO BE DONE 5/31 AT [facility] 2. BMET TO BE DONE 6/7 AT [facility] Testing/Procedures: Your physician has requested that you have an echocardiogram. Echocardiography is a painless test that uses sound waves to create images of your heart. It provides your doctor with information about the size and shape of your heart and how well your heart's chambers and valves are working. This procedure takes approximately one hour. There are no restrictions for this procedure. THIS IS TO BE DONE IN 3 WEEKS AT THE [facility] Follow-Up: 4 WEEKS WITH SCOTT WEAVER, PAC SAME DAY DR. Marlou Porch IS IN THE OFFICE Any Other Special Instructions Will Be Listed Below (If Applicable). If you need a refill on your cardiac medications before your next appointment, please call your pharmacy.

## 2016-03-19 ENCOUNTER — Other Ambulatory Visit (INDEPENDENT_AMBULATORY_CARE_PROVIDER_SITE_OTHER): Payer: Medicare Other | Admitting: *Deleted

## 2016-03-19 DIAGNOSIS — I420 Dilated cardiomyopathy: Secondary | ICD-10-CM

## 2016-03-19 DIAGNOSIS — I428 Other cardiomyopathies: Secondary | ICD-10-CM | POA: Diagnosis not present

## 2016-03-19 LAB — BASIC METABOLIC PANEL
BUN: 21 mg/dL (ref 7–25)
CO2: 27 mmol/L (ref 20–31)
Calcium: 10.5 mg/dL — ABNORMAL HIGH (ref 8.6–10.4)
Chloride: 101 mmol/L (ref 98–110)
Creat: 1.16 mg/dL — ABNORMAL HIGH (ref 0.60–0.93)
Glucose, Bld: 103 mg/dL — ABNORMAL HIGH (ref 65–99)
Potassium: 3.7 mmol/L (ref 3.5–5.3)
Sodium: 140 mmol/L (ref 135–146)

## 2016-03-19 NOTE — Addendum Note (Signed)
Addended by: Eulis Foster on: 03/19/2016 11:30 AM   Modules accepted: Orders

## 2016-03-20 ENCOUNTER — Telehealth: Payer: Self-pay | Admitting: *Deleted

## 2016-03-20 NOTE — Telephone Encounter (Signed)
Pt notified of lab results by phone with verbal understanding.  

## 2016-03-26 ENCOUNTER — Other Ambulatory Visit: Payer: Self-pay | Admitting: Physician Assistant

## 2016-03-26 ENCOUNTER — Ambulatory Visit (HOSPITAL_BASED_OUTPATIENT_CLINIC_OR_DEPARTMENT_OTHER)
Admission: RE | Admit: 2016-03-26 | Discharge: 2016-03-26 | Disposition: A | Discharge status: Home / Self Care | Payer: Medicare Other | Source: Ambulatory Visit | Attending: Physician Assistant | Admitting: Physician Assistant

## 2016-03-26 DIAGNOSIS — I351 Nonrheumatic aortic (valve) insufficiency: Secondary | ICD-10-CM | POA: Insufficient documentation

## 2016-03-26 DIAGNOSIS — I420 Dilated cardiomyopathy: Secondary | ICD-10-CM | POA: Insufficient documentation

## 2016-03-26 DIAGNOSIS — N189 Chronic kidney disease, unspecified: Secondary | ICD-10-CM | POA: Insufficient documentation

## 2016-03-26 DIAGNOSIS — I130 Hypertensive heart and chronic kidney disease with heart failure and stage 1 through stage 4 chronic kidney disease, or unspecified chronic kidney disease: Secondary | ICD-10-CM | POA: Insufficient documentation

## 2016-03-26 DIAGNOSIS — I313 Pericardial effusion (noninflammatory): Secondary | ICD-10-CM | POA: Diagnosis not present

## 2016-03-26 DIAGNOSIS — I509 Heart failure, unspecified: Secondary | ICD-10-CM | POA: Diagnosis not present

## 2016-03-26 DIAGNOSIS — I059 Rheumatic mitral valve disease, unspecified: Secondary | ICD-10-CM | POA: Diagnosis not present

## 2016-03-26 LAB — ECHOCARDIOGRAM COMPLETE
E decel time: 261 ms
E/e' ratio: 7.17
FS: 25 % — AB (ref 28–44)
IVS/LV PW RATIO, ED: 1.3
LA ID, A-P, ES: 39 mm
LA diam end sys: 39 mm
LA diam index: 2.46 cm/m2
LA vol A4C: 62 mL
LA vol index: 36.6 mL/m2
LA vol: 58 mL
LV E/e' medial: 7.17
LV E/e'average: 7.17
LV PW d: 10.3 mm — AB (ref 0.6–1.1)
LV dias vol index: 45 mL/m2
LV dias vol: 71 mL (ref 46–106)
LV e' LATERAL: 8.68 cm/s
LV sys vol index: 23 mL/m2
LV sys vol: 37 mL (ref 14–42)
LVOT SV: 45 mL
LVOT VTI: 17.6 cm
LVOT area: 2.54 cm2
LVOT diameter: 18 mm
LVOT peak vel: 82 cm/s
MV Dec: 261
MV pk A vel: 104 m/s
MV pk E vel: 62.2 m/s
P 1/2 time: 659 ms
Simpson's disk: 48
Stroke v: 34 mL
TDI e' lateral: 8.68
TDI e' medial: 2.34

## 2016-03-26 LAB — BASIC METABOLIC PANEL
BUN: 14 mg/dL (ref 7–25)
CHLORIDE: 101 mmol/L (ref 98–110)
CO2: 28 mmol/L (ref 20–31)
CREATININE: 1.02 mg/dL — AB (ref 0.60–0.93)
Calcium: 10.3 mg/dL (ref 8.6–10.4)
Glucose, Bld: 76 mg/dL (ref 65–99)
POTASSIUM: 3.6 mmol/L (ref 3.5–5.3)
Sodium: 141 mmol/L (ref 135–146)

## 2016-03-26 NOTE — Progress Notes (Signed)
  Echocardiogram 2D Echocardiogram has been performed.  Carla Bennett 03/26/2016, 11:24 AM

## 2016-03-27 ENCOUNTER — Encounter: Payer: Self-pay | Admitting: Physician Assistant

## 2016-04-03 ENCOUNTER — Ambulatory Visit (INDEPENDENT_AMBULATORY_CARE_PROVIDER_SITE_OTHER): Payer: Medicare Other | Admitting: Cardiology

## 2016-04-03 ENCOUNTER — Encounter: Payer: Self-pay | Admitting: Cardiology

## 2016-04-03 VITALS — BP 118/70 | HR 62 | Ht 61.0 in | Wt 128.4 lb

## 2016-04-03 DIAGNOSIS — I3139 Other pericardial effusion (noninflammatory): Secondary | ICD-10-CM

## 2016-04-03 DIAGNOSIS — I420 Dilated cardiomyopathy: Secondary | ICD-10-CM | POA: Diagnosis not present

## 2016-04-03 DIAGNOSIS — I5022 Chronic systolic (congestive) heart failure: Secondary | ICD-10-CM | POA: Diagnosis not present

## 2016-04-03 DIAGNOSIS — I319 Disease of pericardium, unspecified: Secondary | ICD-10-CM

## 2016-04-03 DIAGNOSIS — I313 Pericardial effusion (noninflammatory): Secondary | ICD-10-CM

## 2016-04-03 MED ORDER — FUROSEMIDE 20 MG PO TABS: 20.0000 mg | ORAL_TABLET | Freq: Every day | ORAL | Status: DC

## 2016-04-03 NOTE — Progress Notes (Signed)
Cardiology Office Note    Date:  04/03/2016   ID:  Carlis Stable Billinger, DOB [id number], MRN [id number]  PCP:  Thressa Sheller, MD  Cardiologist:   Candee Furbish, MD     History of Present Illness:  Carla Bennett is a 81 y.o. female here for Follow-up of systolic heart failure, cardiomyopathy, previous chemotherapy for breast cancer, pericardial effusion.  She had an echo performed on 03/13/15 which at that time showed no evidence of pericardial effusion. Mild mitral and aortic regurgitation noted. Normal ejection fraction. This was pre-chemotherapy to breast cancer.  Moderate-sized pericardial effusion was personally viewed on CT scan from 02/07/16. Aortic atherosclerosis and mild coronary artery atherosclerosis was also noted. Pleural effusions were also noted.  In general, she did feel weakness and fatigue surrounding her chemotherapy/radiation however in March for instance she was feeling much better from a dyspnea standpoint. Really did not have any significant issues with that she states. Her shortness of breath when going up stairs is much improved, fairly relieved She denies any chest pain, syncope, bleeding, fevers, orthopnea, and explained weight loss, night sweats  On her echocardiogram originally in April, her ejection fraction was markedly reduced at 20-25%. She had been treated with Adriamycin. This also showed moderate pericardial effusion.  A subsequent follow-up echo 1 month later showed ejection fraction of 35%. Improved. Effusion was trace.  She has been feeling some mild dizziness over the last week or so since the heat wave. She no longer has any edema and she had been taking Lasix 40/20 daily.    Past Medical History  Diagnosis Date  . Diverticulosis of colon (without mention of hemorrhage)   . Benign neoplasm of colon   . Diverticulitis   . Arthritis   . Gout   . Hypertension   . Allergy   . Other issue of medical certificates     uterine myomata  .  Hematuria     negative urology work up  . Hx of abnormal cervical Pap smear     LEEP-CIN I, negative margins and ECC  . Uterine prolaps   . Cystocele   . Basal cell carcinoma   . Hypercalcemia   . Cholelithiasis   . History of blood transfusion     x 1 with D&C  . Renal disease 3/16    Stage 3 kidney disease-seeing Dr Buddy Duty and Dr Marval Regal  . Breast cancer of upper-outer quadrant of left female breast ([place]) 02/23/2015  . Parathyroid abnormality ([place])   . Radiation 10/01/15-11/16/15    left breast axillary and supraclavicular reigion 45 gray, left breast 50.4 gray, lumpectomy cavity boost 10 gray  . Chronic systolic CHF (congestive heart failure) ([place]) 02/26/2016    A.  Echo 02/21/16:  Mild LVH, EF 20-25%, diff HK, Gr 3 DD, mod AI, mod to severe MR, mod LAE, mod redcued RVSF, mild RAE, mod TR, PASP 53 mmHg, small pericardial effusion, mod L pleural effusion  //  B. Echo 6/17:  Mild LVH EF 30-35%, diffuse HK, grade 1 diastolic dysfunction, trivial AI, MAC, mild LAE, normal RVSF, trivial pericardial effusion      Past Surgical History  Procedure Laterality Date  . Cystocele repair  08/2010    Rectocele, vault prolapse  . Dilation and curettage of uterus      x2  . Tonsillectomy and adenoidectomy    . Cholecystectomy    . Total vaginal hysterectomy      secondary to prolapse, ovaries not removed  . Portacath placement  Right 03/20/2015    Procedure: INSERTION PORT-A-CATH;  Surgeon: Erroll Luna, MD;  Location: [facility];  Service: General;  Laterality: Right;  . Radioactive seed guided mastectomy with axillary sentinel lymph node biopsy Left 08/23/2015    Procedure: LEFT BREAST PARTIAL MASTECTOMY WITH SENTINEL LYMPH NODE MAPPING;  Surgeon: Erroll Luna, MD;  Location: [facility];  Service: General;  Laterality: Left;  . Port-a-cath removal Right 08/23/2015    Procedure: REMOVAL PORT-A-CATH;  Surgeon: Erroll Luna, MD;  Location: [facility];  Service: General;   Laterality: Right;    Current Medications: Outpatient Prescriptions Prior to Visit  Medication Sig Dispense Refill  . B Complex Vitamins (VITAMIN B COMPLEX PO) Take 1 tablet by mouth daily.     . carvedilol (COREG) 12.5 MG tablet Take 1 tablet (12.5 mg total) by mouth 2 (two) times daily. 180 tablet 3  . lisinopril (PRINIVIL,ZESTRIL) 40 MG tablet Take 40 mg by mouth daily.     Marland Kitchen loratadine (CLARITIN) 10 MG tablet Take 10 mg by mouth daily.    Marland Kitchen spironolactone (ALDACTONE) 25 MG tablet Take 0.5 tablets (12.5 mg total) by mouth daily. 30 tablet 11  . furosemide (LASIX) 40 MG tablet Take one tablet every morning and half tablet every afternoon 135 tablet 3   Facility-Administered Medications Prior to Visit  Medication Dose Route Frequency Provider Last Rate Last Dose  . sodium chloride 0.9 % injection 10 mL  10 mL Intracatheter PRN Nicholas Lose, MD   10 mL at 06/07/15 1559     Allergies:   Review of patient's allergies indicates no known allergies.   Social History   Social History  . Marital Status: Married    Spouse Name: N/A  . Number of Children: 4  . Years of Education: N/A   Occupational History  . retired    Social History Main Topics  . Smoking status: Never Smoker   . Smokeless tobacco: Never Used  . Alcohol Use: Yes     Comment: occasionally  . Drug Use: No  . Sexual Activity: No     Comment: TVH   Other Topics Concern  . None   Social History Narrative     Family History:  The patient's family history includes Alzheimer's disease in her mother; Breast cancer (age of onset: 60) in her sister; Cancer in her maternal uncle; Cancer (age of onset: 44) in her cousin and maternal uncle; Lung cancer in her mother; Lung cancer (age of onset: 59) in her father; Multiple sclerosis in her sister; Parkinson's disease in her mother; Prostate cancer (age of onset: 61) in her maternal uncle. There is no history of Colon cancer, Esophageal cancer, Rectal cancer, or Stomach cancer.    ROS:   Please see the history of present illness.    ROS All other systems reviewed and are negative.   PHYSICAL EXAM:   VS:  BP 118/70 mmHg  Pulse 62  Ht 5\' 1"  (1.549 m)  Wt 128 lb 6.4 oz (58.242 kg)  BMI 24.27 kg/m2  LMP 10/20/1996   GEN: Well nourished, well developed, in no acute distress HEENT: normal Neck: no significant JVD, carotid bruits, or masses Cardiac: RRR; no significant murmurs, rubs, or gallops,no edema  Respiratory:  clear to auscultation bilaterally, normal work of breathing GI: soft, nontender, nondistended, + BS MS: no deformity or atrophy Skin: warm and dry, no rash Neuro:  Alert and Oriented x 3, Strength and sensation are intact Psych: euthymic mood, full affect  Wt Readings from Last 3 Encounters:  04/03/16 128 lb 6.4 oz (58.242 kg)  03/14/16 127 lb (57.607 kg)  02/26/16 128 lb 3.2 oz (58.151 kg)      Studies/Labs Reviewed:   EKG:  EKG is not ordered today.  The ekg from 02/07/16 shows sinus rhythm, lower voltage, poor R-wave progression, no other significant abnormality.  Recent Labs: 05/24/2015: TSH 0.528 08/08/2015: ALT 14 02/07/2016: Hemoglobin 14.4; Platelets 286 03/19/2016: BUN 21; Creat 1.16*; Potassium 3.7; Sodium 140   Lipid Panel No results found for: CHOL, TRIG, HDL, CHOLHDL, VLDL, LDLCALC, LDLDIRECT  Additional studies/ records that were reviewed today include:  CT scan prior old notes, lab work, prior echocardiogram    ASSESSMENT:    1. Pericardial effusion   2. Chronic systolic CHF (congestive heart failure) ([place])   3. Dilated cardiomyopathy ([place])      PLAN:  In order of problems listed above:  Pericardial effusion    - Seen on CT scan 02/07/16 moderate in size.  - Currently trivial on most recent echo gram. Reassuring.  - I'm aware of her creatinine of 1.3. She is following with Dr. Marval Regal.   Aortic atherosclerosis, mild coronary atherosclerosis seen on CT  - Secondary prevention.  Chronic systolic heart  failure  - Mild dizziness, blood pressure is decreased. Decreasing Lasix to 20 mg once a day from 40 a.m., 20 p.m.  - Continue carvedilol and lisinopril and spironolactone.  - Currently NYHA class I. No symptoms. Doing well.  - Likely nonischemic cardiomyopathy, likely from chemotherapy or perhaps viral mediated.  - In 6 months, we may recheck echocardiogram EF.  - I personally reviewed CT scan of chest, no obvious coronary calcium detected. EF prior to breast cancer was normal. This minimizes the likelihood of ischemic cardiomyopathy.  - We will hold off from cardiac catheterization at this time. EF has improved from 20 up to 35.   Medication Adjustments/Labs and Tests Ordered: Current medicines are reviewed at length with the patient today.  Concerns regarding medicines are outlined above.  Medication changes, Labs and Tests ordered today are listed in the Patient Instructions below. Patient Instructions  Medication Instructions:  Please decrease Furosemide to 20 mg a day.  If increased wt of 3 lbs overnight, take an extra 40 mg that day. Continue all other medications as listed.  Please weigh yourself daily and keep a diary of it.  Follow-Up: Follow up in 3 months with Dr Marlou Porch.  If you need a refill on your cardiac medications before your next appointment, please call your pharmacy.  Thank you for choosing [facility]!!           Signed, Candee Furbish, MD  04/03/2016 5:22 PM    [facility] [address], [place], [place]  [address] Phone: [phone]; Fax: [phone]

## 2016-04-03 NOTE — Patient Instructions (Signed)
Medication Instructions:  Please decrease Furosemide to 20 mg a day.  If increased wt of 3 lbs overnight, take an extra 40 mg that day. Continue all other medications as listed.  Please weigh yourself daily and keep a diary of it.  Follow-Up: Follow up in 3 months with Dr Marlou Porch.  If you need a refill on your cardiac medications before your next appointment, please call your pharmacy.  Thank you for choosing [facility]!!

## 2016-04-08 DIAGNOSIS — E210 Primary hyperparathyroidism: Secondary | ICD-10-CM | POA: Diagnosis not present

## 2016-04-08 DIAGNOSIS — E042 Nontoxic multinodular goiter: Secondary | ICD-10-CM | POA: Diagnosis not present

## 2016-04-24 DIAGNOSIS — R921 Mammographic calcification found on diagnostic imaging of breast: Secondary | ICD-10-CM | POA: Diagnosis not present

## 2016-04-24 DIAGNOSIS — Z853 Personal history of malignant neoplasm of breast: Secondary | ICD-10-CM | POA: Diagnosis not present

## 2016-04-28 ENCOUNTER — Other Ambulatory Visit: Payer: Self-pay | Admitting: Radiology

## 2016-04-28 DIAGNOSIS — R921 Mammographic calcification found on diagnostic imaging of breast: Secondary | ICD-10-CM | POA: Diagnosis not present

## 2016-04-28 DIAGNOSIS — R928 Other abnormal and inconclusive findings on diagnostic imaging of breast: Secondary | ICD-10-CM | POA: Diagnosis not present

## 2016-04-28 DIAGNOSIS — N6032 Fibrosclerosis of left breast: Secondary | ICD-10-CM | POA: Diagnosis not present

## 2016-04-28 DIAGNOSIS — Z853 Personal history of malignant neoplasm of breast: Secondary | ICD-10-CM | POA: Diagnosis not present

## 2016-05-15 ENCOUNTER — Telehealth: Payer: Self-pay | Admitting: Hematology and Oncology

## 2016-05-15 ENCOUNTER — Ambulatory Visit (HOSPITAL_BASED_OUTPATIENT_CLINIC_OR_DEPARTMENT_OTHER): Payer: Medicare Other | Admitting: Hematology and Oncology

## 2016-05-15 ENCOUNTER — Encounter: Payer: Self-pay | Admitting: Hematology and Oncology

## 2016-05-15 ENCOUNTER — Other Ambulatory Visit (HOSPITAL_BASED_OUTPATIENT_CLINIC_OR_DEPARTMENT_OTHER): Payer: Medicare Other

## 2016-05-15 DIAGNOSIS — Z853 Personal history of malignant neoplasm of breast: Secondary | ICD-10-CM | POA: Diagnosis not present

## 2016-05-15 DIAGNOSIS — C50412 Malignant neoplasm of upper-outer quadrant of left female breast: Secondary | ICD-10-CM

## 2016-05-15 LAB — CBC WITH DIFFERENTIAL/PLATELET
BASO%: 0.7 % (ref 0.0–2.0)
Basophils Absolute: 0.1 10*3/uL (ref 0.0–0.1)
EOS%: 1.5 % (ref 0.0–7.0)
Eosinophils Absolute: 0.1 10*3/uL (ref 0.0–0.5)
HCT: 46.7 % — ABNORMAL HIGH (ref 34.8–46.6)
HGB: 15.4 g/dL (ref 11.6–15.9)
LYMPH%: 16.2 % (ref 14.0–49.7)
MCH: 28.5 pg (ref 25.1–34.0)
MCHC: 32.9 g/dL (ref 31.5–36.0)
MCV: 86.6 fL (ref 79.5–101.0)
MONO#: 0.5 10*3/uL (ref 0.1–0.9)
MONO%: 6.4 % (ref 0.0–14.0)
NEUT#: 5.8 10*3/uL (ref 1.5–6.5)
NEUT%: 75.2 % (ref 38.4–76.8)
Platelets: 258 10*3/uL (ref 145–400)
RBC: 5.39 10*6/uL (ref 3.70–5.45)
RDW: 15 % — ABNORMAL HIGH (ref 11.2–14.5)
WBC: 7.8 10*3/uL (ref 3.9–10.3)
lymph#: 1.3 10*3/uL (ref 0.9–3.3)

## 2016-05-15 LAB — COMPREHENSIVE METABOLIC PANEL
ALT: 12 U/L (ref 0–55)
ANION GAP: 11 meq/L (ref 3–11)
AST: 16 U/L (ref 5–34)
Albumin: 3.8 g/dL (ref 3.5–5.0)
Alkaline Phosphatase: 94 U/L (ref 40–150)
BUN: 17.1 mg/dL (ref 7.0–26.0)
CHLORIDE: 105 meq/L (ref 98–109)
CO2: 27 meq/L (ref 22–29)
CREATININE: 1.2 mg/dL — AB (ref 0.6–1.1)
Calcium: 11.2 mg/dL — ABNORMAL HIGH (ref 8.4–10.4)
EGFR: 44 mL/min/{1.73_m2} — ABNORMAL LOW (ref >90)
Glucose: 109 mg/dl (ref 70–140)
POTASSIUM: 4.4 meq/L (ref 3.5–5.1)
Sodium: 143 mEq/L (ref 136–145)
Total Bilirubin: 1.11 mg/dL (ref 0.20–1.20)
Total Protein: 6.9 g/dL (ref 6.4–8.3)

## 2016-05-15 IMAGING — PT NM PET TUM IMG INITIAL (PI) SKULL BASE T - THIGH
8 series · 25 of 25 positions shown · non-contrast
Comparison: None.

CLINICAL DATA: Initial treatment strategy for breast cancer.

EXAM:
NUCLEAR MEDICINE PET SKULL BASE TO THIGH
TECHNIQUE: 7.58 mCi F-18 FDG was injected intravenously. Full-ring PET imaging
was performed from the skull base to thigh after the radiotracer. CT
data was obtained and used for attenuation correction and anatomic
localization.
FASTING BLOOD GLUCOSE:  Value: 98 mg/dl

[Series 3: pet sk_thigh ac · axial · 5.0mm · 4.07mm/px · z∈[-854,+26]mm · 5 of 221 slices shown]
[im 1/221]
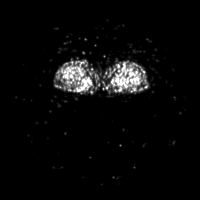
[im 56/221]
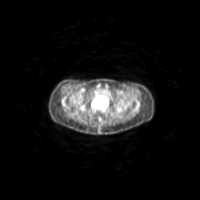
[im 111/221]
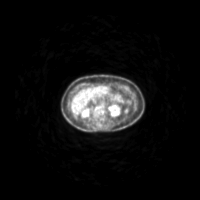
[im 166/221]
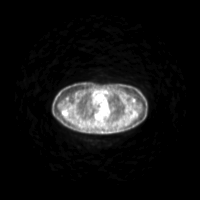
[im 221/221]
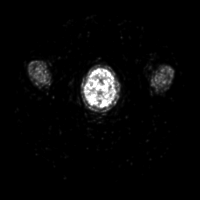

[Series 4: ct sk_thigh 5.0 b31f · axial · 5.0mm · 0.98mm/px · z∈[-854,+26]mm · 5 of 221 slices shown]
[im 1/221]
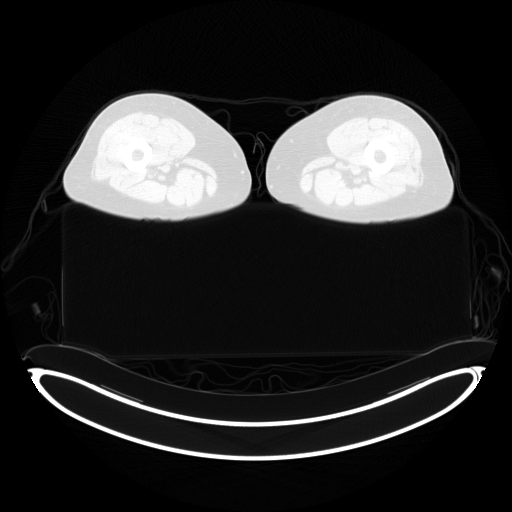
[im 56/221]
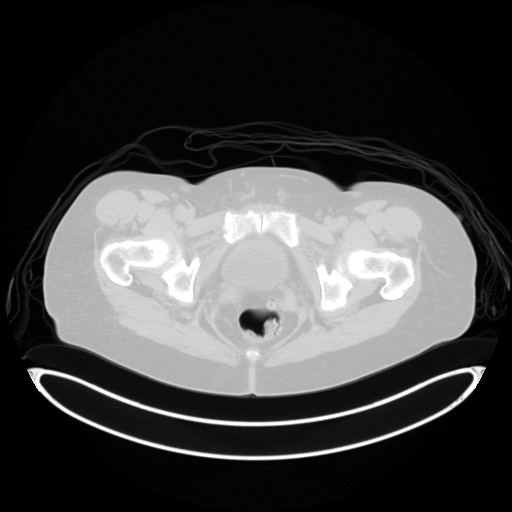
[im 111/221]
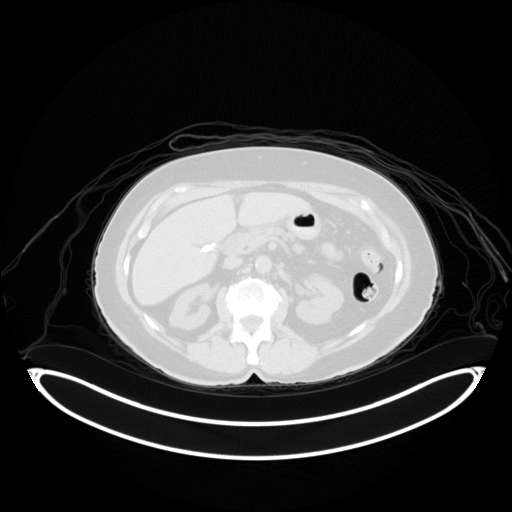
[im 166/221]
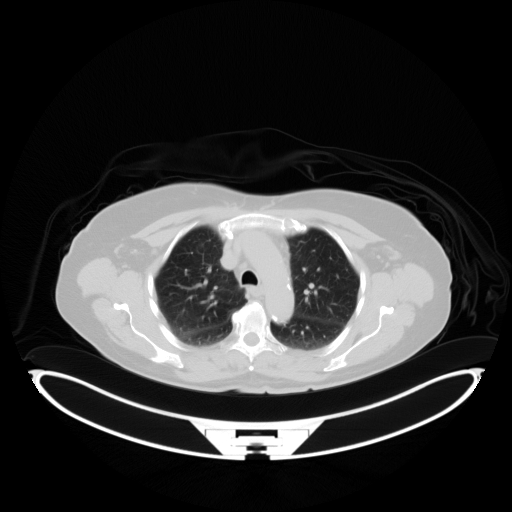
[im 221/221  brain]
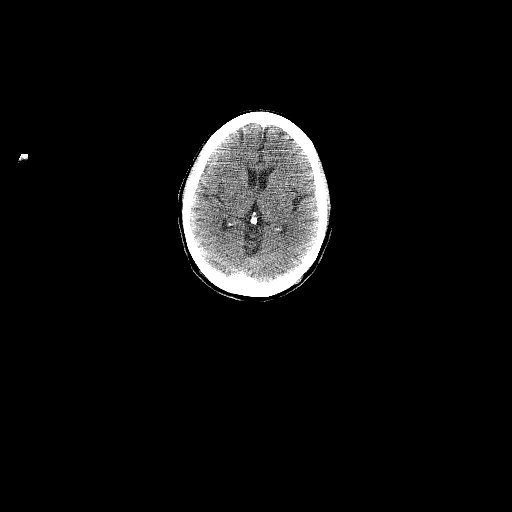

[Series 7: pet sk_thigh nac · axial · 5.0mm · 4.07mm/px · z∈[-854,+26]mm · 5 of 221 slices shown]
[im 1/221]
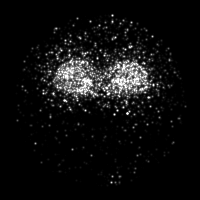
[im 56/221]
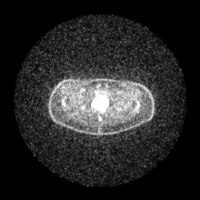
[im 111/221]
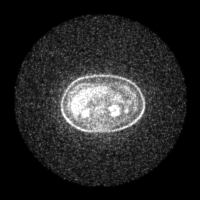
[im 166/221]
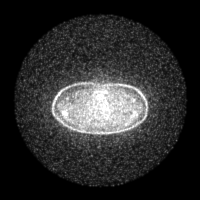
[im 221/221]
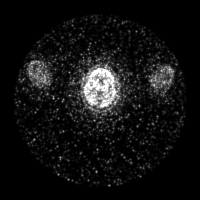

[Series 8: ct sk_thigh 5.0 b70f (id)_bone · axial · 5.0mm · 0.65mm/px · 1 of 53 slices shown]
[im 1/53  bone]
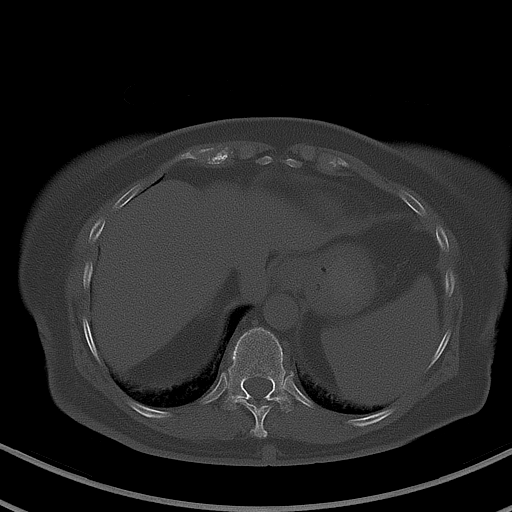

[Series 603: mip collection<mip range> · coronal · 1.83mm/px · 1 of 32 slices shown]
[im 1/32]
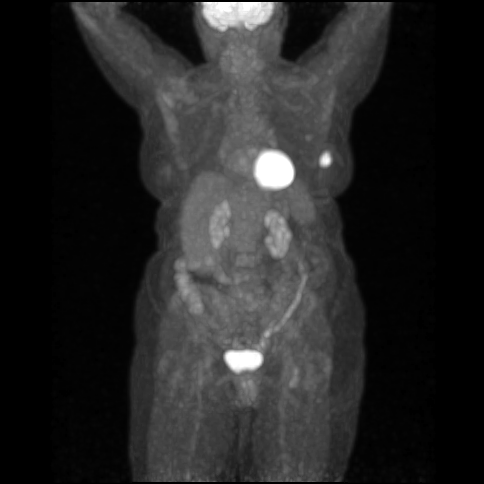

[Series 604: range-ct sk_thigh 5.0 (id)<alpha range> · 2 of 69 slices shown (1 of 2)]
[im 1/69]
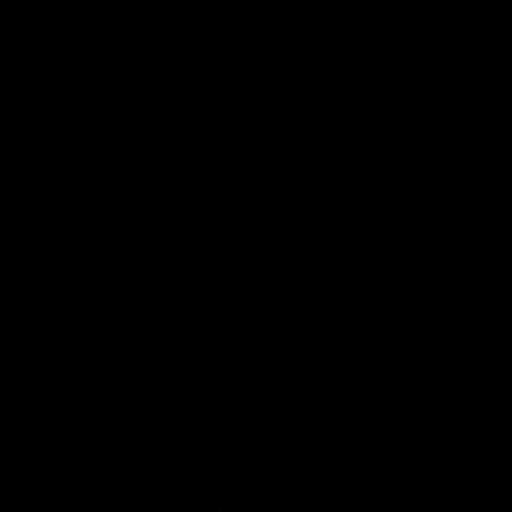
[im 69/69]
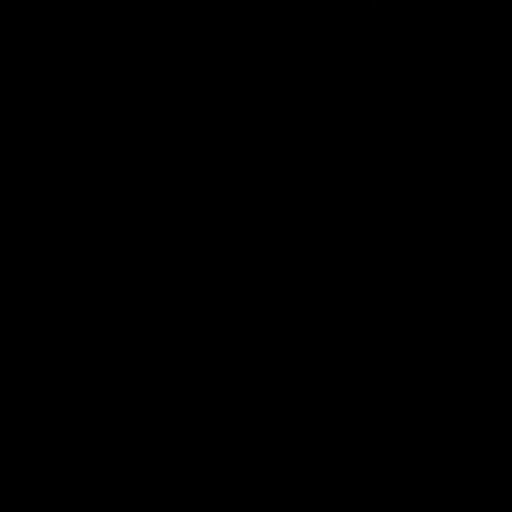

[Series 605: range-ct sk_thigh 5.0 (id)<alpha range> · 5 of 206 slices shown (2 of 2)]
[im 1/206]
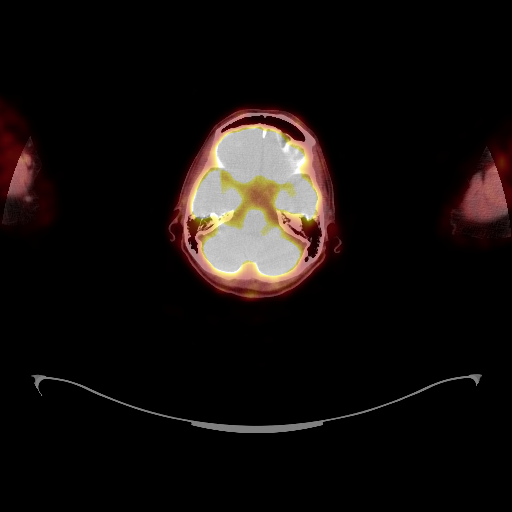
[im 52/206]
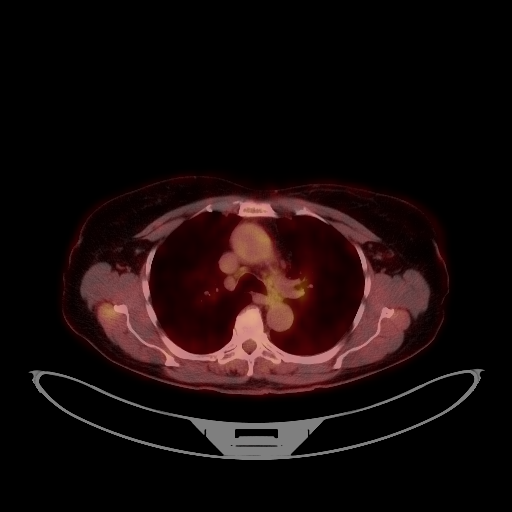
[im 103/206]
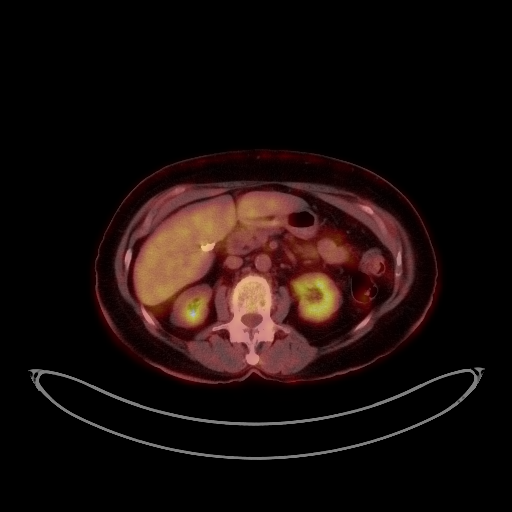
[im 154/206]
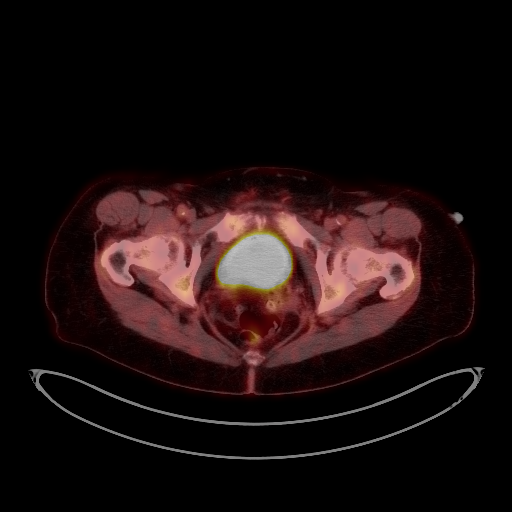
[im 206/206]
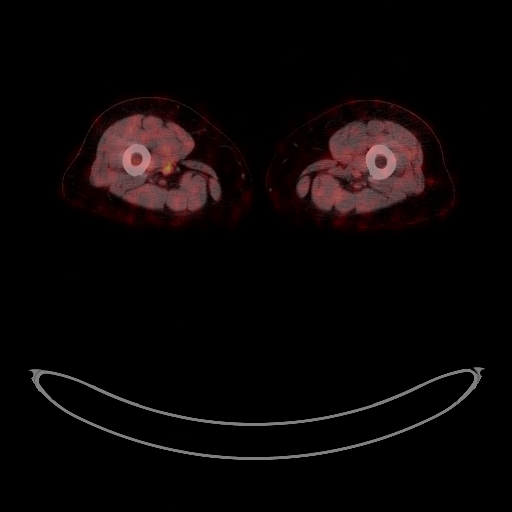

[Series 1036: results mm oncology reading · 0.89mm/px · 1 of 3 slices shown]
[im 1/3]
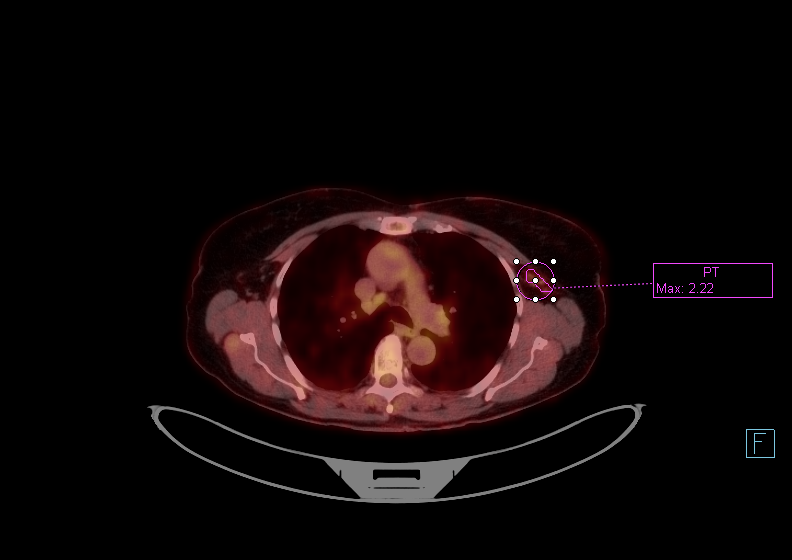

[25 of 25 positions shown; findings below may reference images not displayed]

FINDINGS: NECK

No hypermetabolic lymph nodes in the neck.

CHEST

There is a 2.8 cm solid irregular left breast mass which is
hypermetabolic. SUV max is 14.2. There is an adjacent cystic
structure which is not hypermetabolic. An 8 mm intramammary lymph
node is noted near the serrated is anterior muscle but it is not
hypermetabolic. There is a small cluster of left axillary lymph
nodes which are weakly hypermetabolic with SUV max of 2.2.

No enlarged or hypermetabolic mediastinal or hilar lymph nodes. No
worrisome pulmonary nodules to suggest metastatic disease.

ABDOMEN/PELVIS

No abnormal hypermetabolic activity within the liver, pancreas,
adrenal glands, or spleen. No hypermetabolic lymph nodes in the
abdomen or pelvis. Small adrenal gland nodules demonstrate low
attenuation and are consistent with benign adenomas. No
hypermetabolism.

Additional findings include advanced atherosclerotic calcifications
involving the aorta and a simple appearing right ovarian cyst.
Advanced diverticulosis involving the sigmoid colon.

SKELETON

No focal hypermetabolic activity to suggest skeletal metastasis.
IMPRESSION: 1. 2.8 cm solid irregular left breast mass which is hypermetabolic
and consistent with known left breast cancer.
2. Weakly positive axillary lymph nodes or equivocal.
3. No findings for metastatic disease involving the neck, chest,
abdomen or pelvis and no findings for osseous metastatic disease.

## 2016-05-15 NOTE — Progress Notes (Signed)
Patient Care Team: Thressa Sheller, MD as PCP - General (Internal Medicine) Erroll Luna, MD as Consulting Physician (General Surgery) Nicholas Lose, MD as Consulting Physician (Hematology and Oncology) Gery Pray, MD as Consulting Physician (Radiation Oncology) Mauro Kaufmann, RN as Registered Nurse Rockwell Germany, RN as Registered Nurse Sylvan Cheese, NP as Nurse Practitioner (Hematology and Oncology)  DIAGNOSIS: Breast cancer of upper-outer quadrant of left female breast [facility])   Staging form: Breast, AJCC 7th Edition   - Clinical stage from 03/07/2015: Stage IIB (T2, N1, M0) - Unsigned         Staging comments: Staged at breast conference on 5.18.16   - Pathologic stage from 08/27/2015: Stage IIB (yT2, N1a, cM0) - Signed by Enid Cutter, MD on 09/10/2015         Staging comments: Staged on final surgical specimen by Dr. Avis Epley  SUMMARY OF ONCOLOGIC HISTORY:   Breast cancer of upper-outer quadrant of left female breast ([place])   02/15/2015 Mammogram    Left breast increased density, mass is irregular with microcalcifications measuring 2.9 cm, cyst in the breast as well as abnormal enlarged lymph nodes     02/20/2015 Initial Diagnosis    Left breast biopsy: Invasive ductal carcinoma with DCIS, axillary lymph node biopsy positive, ER 0%, PR 0%, HER-2 negative ratio 1.13 with Ki-67 75%     03/08/2015 Procedure    OvaNext panel [name]) reveals no clinically significant variant at ATM, BARD1, BRCA1, BRCA2, BRIP1, CDH1, CHEK2, EPCAM, MLH1, MRE11A, MSH2, MSH6, MUTYH, NBN, NF1, PALB2, PMS2, PTEN, RAD50, RAD51C, RAD51D, SMARCA4, STK11, and TP53.      03/15/2015 PET scan    2.8 cm solid irregular left breast mass which is hypermetabolic and consistent with known left breast cancer. 2. Weakly positive axillary lymph nodes or equivocal     03/20/2015 Breast MRI    3.1 cm irregular enhancing mass located within the left breast at the 1 o'clock position; 1.6 cm Left axillary LN     03/20/2015 Clinical Stage    Stage IIB: T2 N1     03/22/2015 - 08/02/2015 Neo-Adjuvant Chemotherapy    Neo-adjuvant chemotherapy with dose dense Adriamycin and Cytoxan 4 followed by Abraxane weekly 12     08/06/2015 Breast MRI    Left breast now measures 0.9 x 0.8 x 1.9 cm (previously measured 3.0 x 3.1 x 1.8 cm). Dec size of abnormal Left axill LN     08/23/2015 Surgery    Left lumpectomy: Invasive ductal carcinoma grade 3, 2.8 cm, associated extensive DCIS with comedonecrosis, margins negative,1/2 sentinel nodes positive, ER 0%, PR 0%, HER2/neu repeated and remains negative (ratio 1.13)     08/23/2015 Pathologic Stage    Stage IIB: ypT2 ypN1     10/01/2015 - 11/16/2015 Radiation Therapy    Adjuvant RT: Left breast axillary and supraclavicular region, 45 gray in 25 fractions, the left breast received 3 additional treatments for a cumulative dose of 50.4 gray. Lumpectomy cavity boost 10 gray in 5 fractions     12/27/2015 Survivorship    Survivorship visit completed and copy of care plan provided to patient      CHIEF COMPLIANT: Follow-up of triple negative breast cancer  INTERVAL HISTORY: Carla Bennett is a 81 year old with above-mentioned history of left breast cancer treated with neoadjuvant chemotherapy followed by lumpectomy and still had residual disease. This was triple negative disease. She underwent adjuvant radiation therapy and is currently on surveillance. She reports no pain or discomfort in the breast.  She denies any lumps or nodules in the breasts.  REVIEW OF SYSTEMS:   Constitutional: Denies fevers, chills or abnormal weight loss Eyes: Denies blurriness of vision Ears, nose, mouth, throat, and face: Denies mucositis or sore throat Respiratory: Denies cough, dyspnea or wheezes Cardiovascular: Denies palpitation, chest discomfort Gastrointestinal:  Denies nausea, heartburn or change in bowel habits Skin: Denies abnormal skin rashes Lymphatics: Denies new lymphadenopathy  or easy bruising Neurological:Denies numbness, tingling or new weaknesses Behavioral/Psych: Mood is stable, no new changes  Extremities: No lower extremity edema Breast:  denies any pain or lumps or nodules in either breasts All other systems were reviewed with the patient and are negative.  I have reviewed the past medical history, past surgical history, social history and family history with the patient and they are unchanged from previous note.  ALLERGIES:  has No Known Allergies.  MEDICATIONS:  Current Outpatient Prescriptions  Medication Sig Dispense Refill  . B Complex Vitamins (VITAMIN B COMPLEX PO) Take 1 tablet by mouth daily.     . carvedilol (COREG) 12.5 MG tablet Take 1 tablet (12.5 mg total) by mouth 2 (two) times daily. 180 tablet 3  . furosemide (LASIX) 20 MG tablet Take 1 tablet (20 mg total) by mouth daily. 30 tablet 6  . lisinopril (PRINIVIL,ZESTRIL) 40 MG tablet Take 40 mg by mouth daily.     Marland Kitchen loratadine (CLARITIN) 10 MG tablet Take 10 mg by mouth daily.    Marland Kitchen spironolactone (ALDACTONE) 25 MG tablet Take 0.5 tablets (12.5 mg total) by mouth daily. 30 tablet 11   No current facility-administered medications for this visit.    Facility-Administered Medications Ordered in Other Visits  Medication Dose Route Frequency Provider Last Rate Last Dose  . sodium chloride 0.9 % injection 10 mL  10 mL Intracatheter PRN Nicholas Lose, MD   10 mL at 06/07/15 1559    PHYSICAL EXAMINATION: ECOG PERFORMANCE STATUS: 1 - Symptomatic but completely ambulatory  Vitals:   05/15/16 1019  BP: 138/76  Pulse: 82  Resp: 18  Temp: 98.2 F (36.8 C)   Filed Weights   05/15/16 1019  Weight: 132 lb 11.2 oz (60.2 kg)    GENERAL:alert, no distress and comfortable SKIN: skin color, texture, turgor are normal, no rashes or significant lesions EYES: normal, Conjunctiva are pink and non-injected, sclera clear OROPHARYNX:no exudate, no erythema and lips, buccal mucosa, and tongue normal    NECK: supple, thyroid normal size, non-tender, without nodularity LYMPH:  no palpable lymphadenopathy in the cervical, axillary or inguinal LUNGS: clear to auscultation and percussion with normal breathing effort HEART: regular rate & rhythm and no murmurs and no lower extremity edema ABDOMEN:abdomen soft, non-tender and normal bowel sounds MUSCULOSKELETAL:no cyanosis of digits and no clubbing  NEURO: alert & oriented x 3 with fluent speech, no focal motor/sensory deficits EXTREMITIES: No lower extremity edema BREAST:A No palpable masses or nodules in either right or left breasts. No palpable axillary supraclavicular or infraclavicular adenopathy no breast tenderness or nipple discharge. (exam performed in the presence of a chaperone)  LABORATORY DATA:  I have reviewed the data as listed   Chemistry      Component Value Date/Time   NA 143 05/15/2016 1006   K 4.4 05/15/2016 1006   CL 101 03/26/2016 1150   CO2 27 05/15/2016 1006   BUN 17.1 05/15/2016 1006   CREATININE 1.2 (H) 05/15/2016 1006      Component Value Date/Time   CALCIUM 11.2 (H) 05/15/2016 1006   ALKPHOS 94  05/15/2016 1006   AST 16 05/15/2016 1006   ALT 12 05/15/2016 1006   BILITOT 1.11 05/15/2016 1006       Lab Results  Component Value Date   WBC 7.8 05/15/2016   HGB 15.4 05/15/2016   HCT 46.7 (H) 05/15/2016   MCV 86.6 05/15/2016   PLT 258 05/15/2016   NEUTROABS 5.8 05/15/2016     ASSESSMENT & PLAN:  Breast cancer of upper-outer quadrant of left female breast Left breast invasive ductal carcinoma grade 3 ER 0% PR 0% HER-2 negative Ki-67 75%, 2.9 cm mass with microcalcifications plus abnormal lymph nodes biopsy-proven to be breast cancer T2 N1 M0 stage IIB clinical stage S/P Neoadjuvant Dose dense AC folll by Abraxane X 12  left lumpectomy 08/23/2015 : Invasive ductal carcinoma grade 3, 2.8 cm, associated extensive DCIS with comedonecrosis, margins negative,1/2 sentinel nodes positive, ER 0%, PR 0% T2 N1 M0  stage IIB Completed radiation therapy 11/16/2015   I offered the patient adjuvant Xeloda  But she refused.  Hypercalcemia: Ultrasound of the neck had revealed a parathyroid adenoma. I instructed the patient to discuss the ultrasound and calcium levels with her primary care physician. She may need additional imaging and or surgery evaluation given her current degree of hypercalcemia.   Return to clinic in 6 month for follow up and surveillance.  No orders of the defined types were placed in this encounter.  The patient has a good understanding of the overall plan. she agrees with it. she will call with any problems that may develop before the next visit here.   Rulon Eisenmenger, MD 05/15/16

## 2016-05-15 NOTE — Assessment & Plan Note (Signed)
Left breast invasive ductal carcinoma grade 3 ER 0% PR 0% HER-2 negative Ki-67 75%, 2.9 cm mass with microcalcifications plus abnormal lymph nodes biopsy-proven to be breast cancer T2 N1 M0 stage IIB clinical stage S/P Neoadjuvant Dose dense AC folll by Abraxane X 12  left lumpectomy 08/23/2015 : Invasive ductal carcinoma grade 3, 2.8 cm, associated extensive DCIS with comedonecrosis, margins negative,1/2 sentinel nodes positive, ER 0%, PR 0% T2 N1 M0 stage IIB Completed radiation therapy 11/16/2015   I offered the patient adjuvant Xeloda  But she refused.  Return to clinicin 6 onth for folowup and surveillance

## 2016-05-15 NOTE — Telephone Encounter (Signed)
appt made and avs printed °

## 2016-05-16 DIAGNOSIS — R1033 Periumbilical pain: Secondary | ICD-10-CM | POA: Diagnosis not present

## 2016-05-20 IMAGING — CR DG CHEST 1V
1 series · 1 of 1 positions shown · non-contrast
Comparison: PET-CT 03/15/2015.  Radiographs 03/19/2007.

CLINICAL DATA: Port-A-Cath placement.  Initial encounter.

EXAM:
CHEST  1 VIEW

[AP]
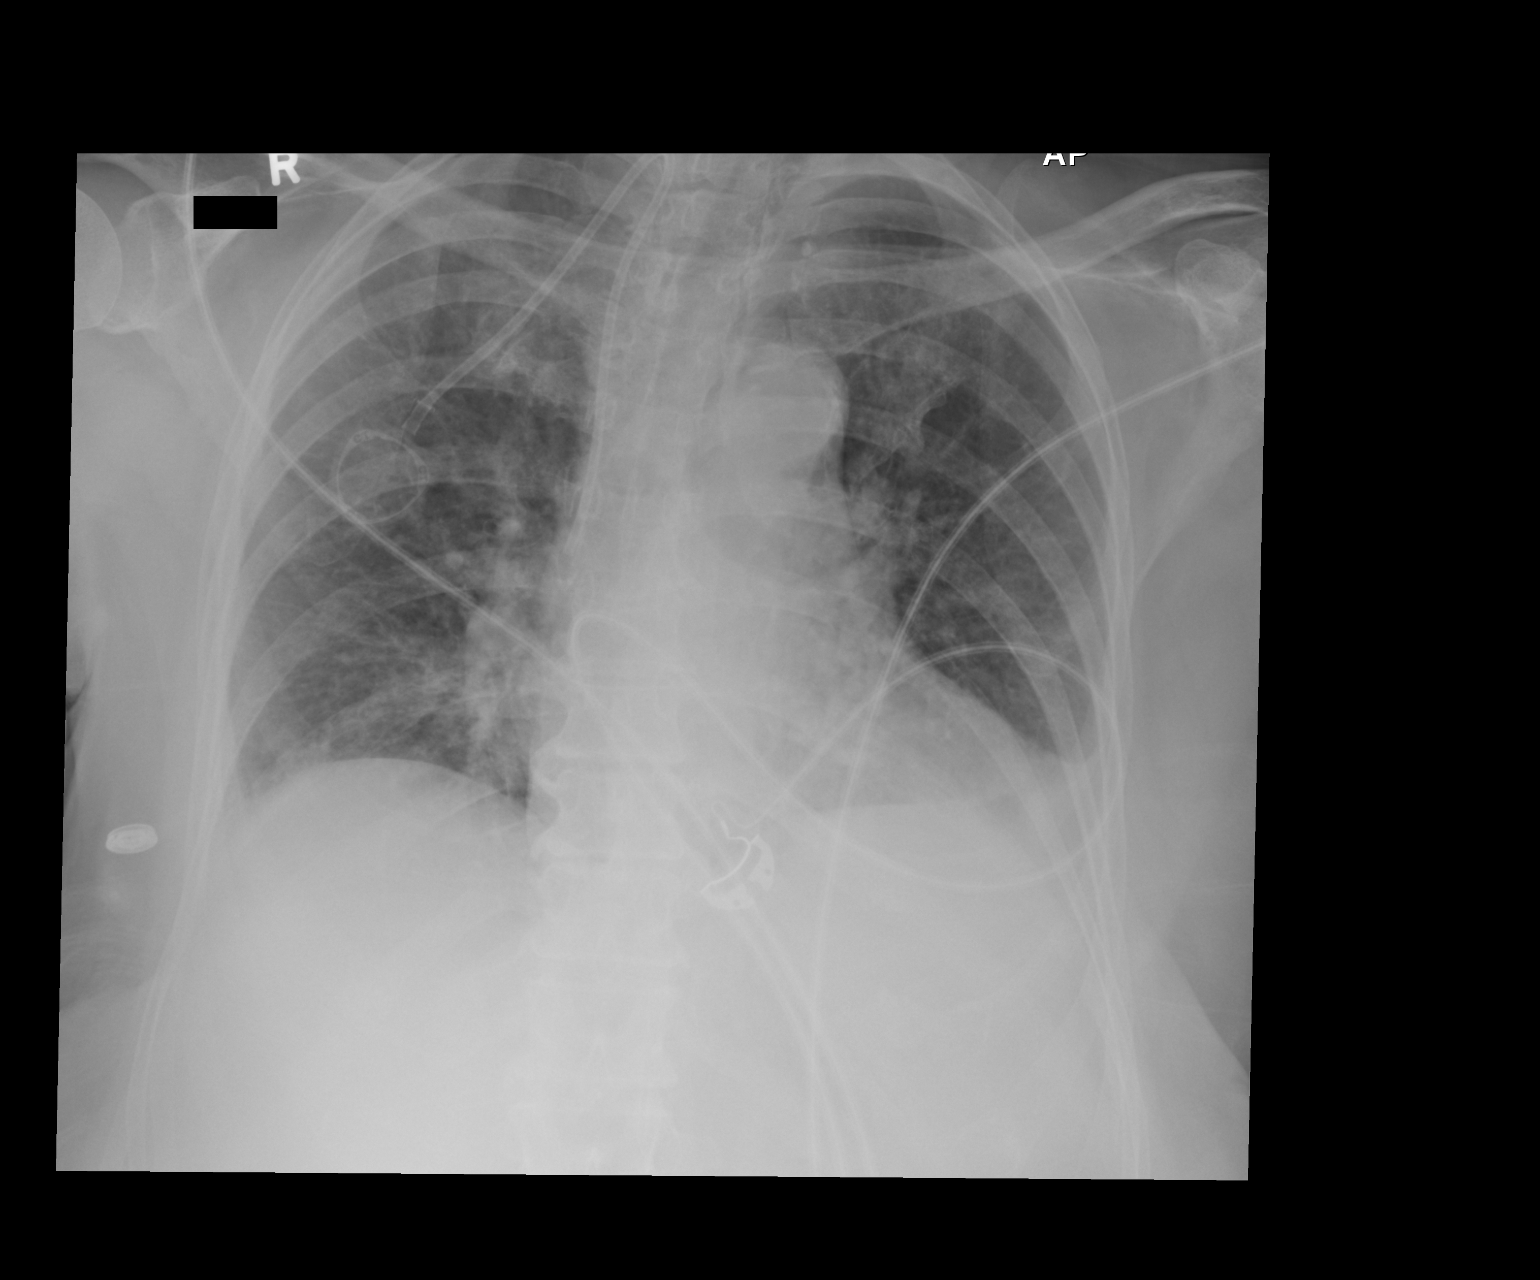

[1 of 1 positions shown; findings below may reference images not displayed]

FINDINGS: 8214 hr. Right IJ Port-A-Cath tip overlaps the lower SVC. The heart
size and mediastinal contours are stable. There are persistent low
lung volumes with associated bibasilar atelectasis. There is
possible minimal pleural fluid on the left. No confluent airspace
opacity or pneumothorax identified.
IMPRESSION: Port-A-Cath placement without complicating pneumothorax. Persistent
bibasilar atelectasis with possible small left pleural effusion.

## 2016-05-22 ENCOUNTER — Ambulatory Visit (INDEPENDENT_AMBULATORY_CARE_PROVIDER_SITE_OTHER): Payer: Medicare Other | Admitting: Physician Assistant

## 2016-05-22 ENCOUNTER — Encounter: Payer: Self-pay | Admitting: Physician Assistant

## 2016-05-22 VITALS — BP 132/70 | HR 70 | Ht 60.0 in | Wt 136.0 lb

## 2016-05-22 DIAGNOSIS — R1033 Periumbilical pain: Secondary | ICD-10-CM | POA: Diagnosis not present

## 2016-05-22 DIAGNOSIS — R1111 Vomiting without nausea: Secondary | ICD-10-CM

## 2016-05-22 MED ORDER — HYOSCYAMINE SULFATE 0.125 MG PO TABS: ORAL_TABLET | ORAL | 0 refills | Status: DC

## 2016-05-22 NOTE — Progress Notes (Signed)
Chief Complaint: Abdominal pain associated with vomiting  HPI:  Carla Bennett is a 81 year old Caucasian female, with a past medical history of chronic systolic congestive heart failure, diverticulosis, breast cancer and hypertension  who was referred to me by Thressa Sheller, MD for a complaint of abdominal pain associated with episodes of vomiting. Recent labs drawn 05/09/2016 show a CMP with an elevated calcium at 11.2 and minimally elevated creatinine at 1.2 which was otherwise normal as well as a CBC which is normal.  Per chart review patient was last seen in our clinic by Tye Savoy, NP in Jan 2014, and followed with Dr. Olevia Perches in the past. At time of last visit the patient described what was presumed to be an episode of diverticulitis which resolved after treatment with Cipro and Flagyl. At that time, the possibility that this could've been ischemic colitis was discussed.  Independent review of report and images from patient's last colonoscopy completed 01/18/13 by Dr. Olevia Perches shows diminutive sessile polyp was found in the descending colon and there was moderate diverticulosis in the sigmoid colon and throughout the entire examined colon. Pathology revealed polypoid colorectal mucosa with lymphoid aggregates. No repeat screening was recommended due to patient's age.  Today, the patient tells me that she was diagnosed with breast cancer last year and underwent chemotherapy and radiation, finishing radiation in January of this year. She expresses that she has had 3 episodes of periumbilical abdominal pain which wakes her from her sleep around 2/2:30 in the morning and lasts for an hour to an hour and a half. She describes this pain as "labor pain", building and going away repeatedly over this time period. This will radiate through to her back. She rates it as a 8-9 out of 10 The patient will then have a bowel movement which is somewhat soft followed by loose watery stool and has vomited on 2  occasions during her bowel movement. Last time this  occurred was July 27, previous to this episodes were in May and June. Typically the patient is occasionally constipated and does use stool softeners when necessary, but tells me that she does not usually need anything to have a regular solid stool every day.  Medical history is also positive for a current workup of an elevated calcium level discovered on 05/09/16 of 11.2, this is thought due to a possible parathyroid adenoma which is being evaluated.  Patient denies fever, chills, blood in her stool, melena, change in bowel habits otherwise, weight loss, fatigue, anorexia, heartburn, reflux, syncope or dizziness.   Past Medical History:  Diagnosis Date  . Allergy   . Aortic regurgitation   . Arthritis   . Basal cell carcinoma   . Benign neoplasm of colon   . Breast cancer of upper-outer quadrant of left female breast ([place]) 02/23/2015  . Cholelithiasis   . Chronic systolic CHF (congestive heart failure) ([place]) 02/26/2016   A.  Echo 02/21/16:  Mild LVH, EF 20-25%, diff HK, Gr 3 DD, mod AI, mod to severe MR, mod LAE, mod redcued RVSF, mild RAE, mod TR, PASP 53 mmHg, small pericardial effusion, mod L pleural effusion  //  B. Echo 6/17:  Mild LVH EF 30-35%, diffuse HK, grade 1 diastolic dysfunction, trivial AI, MAC, mild LAE, normal RVSF, trivial pericardial effusion    . Cystocele   . Diverticulitis   . Diverticulosis of colon (without mention of hemorrhage)   . Gout   . Hematuria    negative urology work up  .  History of blood transfusion    x 1 with D&C  . Hx of abnormal cervical Pap smear    LEEP-CIN I, negative margins and ECC  . Hypercalcemia   . Hypertension   . Mitral regurgitation   . OA (osteoarthritis) of hip    right  . Other issue of medical certificates    uterine myomata  . Parathyroid abnormality ([place])   . Radiation 10/01/15-11/16/15   left breast axillary and supraclavicular reigion 45 gray, left breast 50.4 gray, lumpectomy  cavity boost 10 gray  . Renal disease 3/16   Stage 3 kidney disease-seeing Dr Buddy Duty and Dr Marval Regal  . Uterine prolaps   . Vitamin D deficiency     Past Surgical History:  Procedure Laterality Date  . CHOLECYSTECTOMY    . CYSTOCELE REPAIR  08/2010   Rectocele, vault prolapse  . DILATION AND CURETTAGE OF UTERUS     x2  . PORT-A-CATH REMOVAL Right 08/23/2015   Procedure: REMOVAL PORT-A-CATH;  Surgeon: Erroll Luna, MD;  Location: [facility];  Service: General;  Laterality: Right;  . PORTACATH PLACEMENT Right 03/20/2015   Procedure: INSERTION PORT-A-CATH;  Surgeon: Erroll Luna, MD;  Location: [facility];  Service: General;  Laterality: Right;  . RADIOACTIVE SEED GUIDED MASTECTOMY WITH AXILLARY SENTINEL LYMPH NODE BIOPSY Left 08/23/2015   Procedure: LEFT BREAST PARTIAL MASTECTOMY WITH SENTINEL LYMPH NODE MAPPING;  Surgeon: Erroll Luna, MD;  Location: [facility];  Service: General;  Laterality: Left;  . TONSILLECTOMY AND ADENOIDECTOMY    . TOTAL VAGINAL HYSTERECTOMY     secondary to prolapse, ovaries not removed    Current Outpatient Prescriptions  Medication Sig Dispense Refill  . B Complex Vitamins (VITAMIN B COMPLEX PO) Take 1 tablet by mouth daily.     . carvedilol (COREG) 12.5 MG tablet Take 1 tablet (12.5 mg total) by mouth 2 (two) times daily. 180 tablet 3  . furosemide (LASIX) 20 MG tablet Take 1 tablet (20 mg total) by mouth daily. 30 tablet 6  . lisinopril (PRINIVIL,ZESTRIL) 40 MG tablet Take 40 mg by mouth daily.     Marland Kitchen loratadine (CLARITIN) 10 MG tablet Take 10 mg by mouth daily.    Marland Kitchen spironolactone (ALDACTONE) 25 MG tablet Take 0.5 tablets (12.5 mg total) by mouth daily. 30 tablet 11  . hyoscyamine (LEVSIN, ANASPAZ) 0.125 MG tablet Take one every 4-6 hours as needed for abdominal pain 15 tablet 0   No current facility-administered medications for this visit.    Facility-Administered Medications Ordered in Other Visits  Medication Dose Route  Frequency Provider Last Rate Last Dose  . sodium chloride 0.9 % injection 10 mL  10 mL Intracatheter PRN Nicholas Lose, MD   10 mL at 06/07/15 1559    Allergies as of 05/22/2016  . (No Known Allergies)    Family History  Problem Relation Age of Onset  . Lung cancer Father 57  . Parkinson's disease Mother   . Alzheimer's disease Mother     ? diag  . Lung cancer Mother   . Breast cancer Sister 72  . Multiple sclerosis Sister   . Prostate cancer Maternal Uncle 63  . Cancer Maternal Uncle     stomache  . Cancer Maternal Uncle 95    GI cancer - ? stomach  . Cancer Cousin 102    mat female first cousin (son of mat uncle with GI cancer) with ureter cancer  . Colon cancer Neg Hx   . Esophageal cancer Neg  Hx   . Rectal cancer Neg Hx   . Stomach cancer Neg Hx     Social History   Social History  . Marital status: Married    Spouse name: N/A  . Number of children: 4  . Years of education: N/A   Occupational History  . retired    Social History Main Topics  . Smoking status: Never Smoker  . Smokeless tobacco: Never Used  . Alcohol use Yes     Comment: occasionally  . Drug use: No  . Sexual activity: No     Comment: TVH   Other Topics Concern  . Not on file   Social History Narrative  . No narrative on file    Review of Systems:    Constitutional: Positive for cold sweats during episodes of pain No weight loss, fever, chills, weakness or fatigue HEENT: Eyes: No recent change in vision               Ears, Nose, Throat:  No change in hearing Skin: No rash or itching Cardiovascular: No chest pain, chest pressure or palpitations Respiratory: No SOB or cough Gastrointestinal: See HPI and otherwise negative Genitourinary: No dysuria or change in urinary frequency Neurological: No headache, dizziness or syncope Musculoskeletal: No new muscle or back pain Hematologic: No bleeding or bruising Psychiatric: No history of depression or anxiety   Physical Exam:  Vital  signs: BP 132/70   Pulse 70   Ht 5' (1.524 m) Comment: w/o shoes  Wt 136 lb (61.7 kg)   LMP 10/20/1996   BMI 26.56 kg/m   General:   Pleasant Elderly Caucasian female appears to be in NAD, Well developed, Well nourished, alert and cooperative Head:  Normocephalic and atraumatic. Eyes:   PEERL, EOMI. No icterus. Conjunctiva pink. Ears:  Normal auditory acuity. Neck:  Supple Throat: Oral cavity and pharynx without inflammation, swelling or lesion. Lungs: Respirations even and unlabored. Lungs clear to auscultation bilaterally.   No wheezes, crackles, or rhonchi.  Heart: Normal S1, S2. No MRG. Regular rate and rhythm. No peripheral edema, cyanosis or pallor.  Abdomen:  Soft, nondistended, nontender. No rebound or guarding. Normal bowel sounds. No appreciable masses or hepatomegaly. Rectal:  Not performed.  Msk:  Symmetrical without gross deformities. Peripheral pulses intact.  Extremities:  Without edema, no deformity or joint abnormality. Neurologic:  Alert and  oriented x4;  grossly normal neurologically.  Skin:   Dry and intact without significant lesions or rashes. Psychiatric: Oriented to person, place and time. Demonstrates good judgement and reason without abnormal affect or behaviors.  RELEVANT LABS AND IMAGING: CBC    Component Value Date/Time   WBC 7.8 05/15/2016 1006   WBC 8.0 02/07/2016 1500   RBC 5.39 05/15/2016 1006   RBC 4.87 02/07/2016 1500   HGB 15.4 05/15/2016 1006   HCT 46.7 (H) 05/15/2016 1006   PLT 258 05/15/2016 1006   MCV 86.6 05/15/2016 1006   MCH 28.5 05/15/2016 1006   MCH 29.6 02/07/2016 1500   MCHC 32.9 05/15/2016 1006   MCHC 34.0 02/07/2016 1500   RDW 15.0 (H) 05/15/2016 1006   LYMPHSABS 1.3 05/15/2016 1006   MONOABS 0.5 05/15/2016 1006   EOSABS 0.1 05/15/2016 1006   BASOSABS 0.1 05/15/2016 1006    CMP     Component Value Date/Time   NA 143 05/15/2016 1006   K 4.4 05/15/2016 1006   CL 101 03/26/2016 1150   CO2 27 05/15/2016 1006    GLUCOSE 109 05/15/2016 1006  BUN 17.1 05/15/2016 1006   CREATININE 1.2 (H) 05/15/2016 1006   CALCIUM 11.2 (H) 05/15/2016 1006   PROT 6.9 05/15/2016 1006   ALBUMIN 3.8 05/15/2016 1006   AST 16 05/15/2016 1006   ALT 12 05/15/2016 1006   ALKPHOS 94 05/15/2016 1006   BILITOT 1.11 05/15/2016 1006   GFRNONAA 54 (L) 02/07/2016 1500   GFRAA >60 02/07/2016 1500    Assessment: 1. Periumbilical abdominal pain: 3 distinct episodes of "labor pains" which last one to one and a half hours and result in an episode of loose stool and vomiting, this has occurred over the past 3 months on 3 separate occasions, concern for possible intermittent occlusive vessel disease versus IBS versus constipation 2. Vomiting: Only with above, related  Plan: 1. Did discuss this case with Dr. Henrene Pastor at the time of patient's appointment. Concern regarding possible intermittent arterial blockage. Ordered CT angiography of the abdomen and pelvis for further evaluation. 2. Recommend the patient start a daily fiber supplement such as Metamucil, Benefiber or Citrucel. Coupon was provided. 3. Prescribed Hyoscyamine Sulfate 0.125 mg every 4-6 hours as needed for abdominal cramping pain, #15 4. Patient should follow in clinic with Dr. Henrene Pastor in the next 4-6 weeks or sooner if necessary. 5. Did advise the patient that if she develops abdominal pain which is unrelenting or becomes more frequent she should proceed to the ER.  Ellouise Newer, PA-C [facility] 05/22/2016, 10:36 AM  Cc: Thressa Sheller, MD

## 2016-05-22 NOTE — Patient Instructions (Addendum)
  We have sent the following medications to your pharmacy for you to pick up at your convenience: Hyoscyamine for the abdominal pain   We are giving you a sample and coupon for the metamucil to use.    Follow up with Dr Henrene Pastor in 4-6 weeks.     You have been scheduled for a CT scan of the abdomen and pelvis at [facility] ([address]---this is in the same building as [facility]).   You are scheduled on 06/02/16 at 11:00AM. You should arrive 15 minutes prior to your appointment time for registration. Please follow the written instructions below on the day of your exam:  WARNING: IF YOU ARE ALLERGIC TO IODINE/X-RAY DYE, PLEASE NOTIFY RADIOLOGY IMMEDIATELY AT [phone]! YOU WILL BE GIVEN A 13 HOUR PREMEDICATION PREP.   Do not eat  anything after 9:00AM (2 hours prior to your test)  This test typically takes 30-45 minutes to complete.  If you have any questions regarding your exam or if you need to reschedule, you may call the CT department at [phone] between the hours of 8:00 am and 5:00 pm, Monday-Friday.  ________________________________________________________________________  I appreciate the opportunity to care for you.

## 2016-05-22 NOTE — Progress Notes (Signed)
Case discussed with GI physician assistant. Agree with assessment and plans. Not clear that this is consistent with ischemia but agree with plans for CT scan to assess for this and other potential causes for pain. Could be related to adhesions, bowel irregularity, or other. Agree with follow-up recommendations as well

## 2016-06-02 ENCOUNTER — Ambulatory Visit (INDEPENDENT_AMBULATORY_CARE_PROVIDER_SITE_OTHER)
Admission: RE | Admit: 2016-06-02 | Discharge: 2016-06-02 | Disposition: A | Discharge status: Home / Self Care | Payer: Medicare Other | Source: Ambulatory Visit | Attending: Physician Assistant | Admitting: Physician Assistant

## 2016-06-02 DIAGNOSIS — R1111 Vomiting without nausea: Secondary | ICD-10-CM | POA: Diagnosis not present

## 2016-06-02 DIAGNOSIS — R1033 Periumbilical pain: Secondary | ICD-10-CM

## 2016-06-02 DIAGNOSIS — I700 Atherosclerosis of aorta: Secondary | ICD-10-CM | POA: Diagnosis not present

## 2016-06-02 MED ORDER — IOPAMIDOL (ISOVUE-300) INJECTION 61%
100.0000 mL | Freq: Once | INTRAVENOUS | Status: AC | PRN
  Administered 2016-06-02: 80 mL via INTRAVENOUS

## 2016-06-03 ENCOUNTER — Telehealth: Payer: Self-pay | Admitting: Physician Assistant

## 2016-06-03 ENCOUNTER — Telehealth: Payer: Self-pay

## 2016-06-03 NOTE — Telephone Encounter (Signed)
Spoke with patient regarding abnormal CTA showing soft tissue prominence in cecum. Will proceed with Colonoscopy per Dr. Henrene Pastor. JLL

## 2016-06-04 ENCOUNTER — Telehealth: Payer: Self-pay | Admitting: *Deleted

## 2016-06-04 ENCOUNTER — Other Ambulatory Visit: Payer: Self-pay

## 2016-06-04 DIAGNOSIS — Z1211 Encounter for screening for malignant neoplasm of colon: Secondary | ICD-10-CM

## 2016-06-04 NOTE — Telephone Encounter (Signed)
Dr Henrene Pastor,  This patient is scheduled for a PV 8-21 with a colon 8-29 Tuesday with you. She is a previous [name] pt with her last colon 01-18-13.  She has a history of Left breast cancer, HTN, CHF, dilated cardiomyopathy, CKD. She had an OV with Darrell Jewel PA 05-23-2016.  Reviewing her chart for PV I noted her last echo 03-26-2016 she has an EF of 30-35%, in May 2017 she had an echo with an EF of 20-25%. She had a CT scan 8-14 and colon recommended after these results.  Do you want her to be a hospital case or can she have her procedure in the [facility]?  The ASA IV criteria states EF 35% or less so wanted to be sure with you before her PV Monday.  Thanks very much for your time, Marijean Niemann

## 2016-06-04 NOTE — Telephone Encounter (Signed)
Please schedule at the hospital with MAC/propofol. I am there the first week of September. Thanks

## 2016-06-04 NOTE — Telephone Encounter (Signed)
Pt scheduled for Propofol colon at [facility] 06/26/16@10am . [id number]:[id number]. Scheduled with Janie.

## 2016-06-04 NOTE — Telephone Encounter (Signed)
Thank you. Will forward to L Hunt RN to schedule Lelan Pons PV

## 2016-06-06 ENCOUNTER — Encounter: Payer: Self-pay | Admitting: Cardiology

## 2016-06-09 ENCOUNTER — Ambulatory Visit: Payer: Medicare Other | Admitting: *Deleted

## 2016-06-09 VITALS — Ht 60.0 in | Wt 136.8 lb

## 2016-06-09 DIAGNOSIS — R1033 Periumbilical pain: Secondary | ICD-10-CM

## 2016-06-09 MED ORDER — SUPREP BOWEL PREP KIT 17.5-3.13-1.6 GM/177ML PO SOLN: 1.0000 | Freq: Once | ORAL | 0 refills | Status: AC

## 2016-06-11 ENCOUNTER — Telehealth: Payer: Self-pay | Admitting: *Deleted

## 2016-06-11 ENCOUNTER — Encounter: Payer: Self-pay | Admitting: Cardiology

## 2016-06-11 ENCOUNTER — Telehealth: Payer: Self-pay | Admitting: Internal Medicine

## 2016-06-11 NOTE — Telephone Encounter (Signed)
  Oncology Nurse Navigator Documentation    Navigator Encounter Type: Telephone (06/11/16 1500) Telephone: Carla Bennett Call;Appt Confirmation/Clarification (06/11/16 1500)  Pt wishes to r/s f/u with Dr. Sondra Come. Sent to msg sent to [facility] to call pt with new appt. Pt denies further needs.                                      Time Spent with Patient: 15 (06/11/16 1500)

## 2016-06-11 NOTE — Telephone Encounter (Signed)
Discussed with patient and explained that the reason her procedure had to be changed to the hospital was because of her ejection fraction. Pt was upset she was not notified of the change right when it happened. Explained she was not notified due to having a previsit scheduled. Offered to reschedule the appt to a date that worked better for her and she did not want to reschedule the appt.

## 2016-06-17 ENCOUNTER — Encounter: Payer: Medicare Other | Admitting: Internal Medicine

## 2016-06-19 ENCOUNTER — Encounter (HOSPITAL_COMMUNITY): Payer: Self-pay | Admitting: *Deleted

## 2016-06-26 ENCOUNTER — Encounter (HOSPITAL_COMMUNITY): Payer: Self-pay | Admitting: *Deleted

## 2016-06-26 ENCOUNTER — Ambulatory Visit (HOSPITAL_COMMUNITY)
Admission: RE | Admit: 2016-06-26 | Discharge: 2016-06-26 | Disposition: A | Discharge status: Home / Self Care | Payer: Medicare Other | Source: Ambulatory Visit | Attending: Internal Medicine | Admitting: Internal Medicine

## 2016-06-26 ENCOUNTER — Ambulatory Visit (HOSPITAL_COMMUNITY): Payer: Medicare Other | Admitting: Anesthesiology

## 2016-06-26 ENCOUNTER — Ambulatory Visit: Payer: Self-pay | Admitting: Radiation Oncology

## 2016-06-26 ENCOUNTER — Encounter (HOSPITAL_COMMUNITY)
Admission: RE | Disposition: A | Discharge status: Home / Self Care | Payer: Self-pay | Source: Ambulatory Visit | Attending: Internal Medicine

## 2016-06-26 DIAGNOSIS — N183 Chronic kidney disease, stage 3 (moderate): Secondary | ICD-10-CM | POA: Diagnosis not present

## 2016-06-26 DIAGNOSIS — I130 Hypertensive heart and chronic kidney disease with heart failure and stage 1 through stage 4 chronic kidney disease, or unspecified chronic kidney disease: Secondary | ICD-10-CM | POA: Insufficient documentation

## 2016-06-26 DIAGNOSIS — Z86018 Personal history of other benign neoplasm: Secondary | ICD-10-CM | POA: Insufficient documentation

## 2016-06-26 DIAGNOSIS — R1030 Lower abdominal pain, unspecified: Secondary | ICD-10-CM | POA: Insufficient documentation

## 2016-06-26 DIAGNOSIS — Z9012 Acquired absence of left breast and nipple: Secondary | ICD-10-CM | POA: Insufficient documentation

## 2016-06-26 DIAGNOSIS — H9191 Unspecified hearing loss, right ear: Secondary | ICD-10-CM | POA: Diagnosis not present

## 2016-06-26 DIAGNOSIS — K5730 Diverticulosis of large intestine without perforation or abscess without bleeding: Secondary | ICD-10-CM | POA: Diagnosis not present

## 2016-06-26 DIAGNOSIS — M479 Spondylosis, unspecified: Secondary | ICD-10-CM | POA: Insufficient documentation

## 2016-06-26 DIAGNOSIS — R935 Abnormal findings on diagnostic imaging of other abdominal regions, including retroperitoneum: Secondary | ICD-10-CM

## 2016-06-26 DIAGNOSIS — R933 Abnormal findings on diagnostic imaging of other parts of digestive tract: Secondary | ICD-10-CM | POA: Insufficient documentation

## 2016-06-26 DIAGNOSIS — I340 Nonrheumatic mitral (valve) insufficiency: Secondary | ICD-10-CM | POA: Diagnosis not present

## 2016-06-26 DIAGNOSIS — Z923 Personal history of irradiation: Secondary | ICD-10-CM | POA: Diagnosis not present

## 2016-06-26 DIAGNOSIS — K648 Other hemorrhoids: Secondary | ICD-10-CM | POA: Insufficient documentation

## 2016-06-26 DIAGNOSIS — I509 Heart failure, unspecified: Secondary | ICD-10-CM | POA: Insufficient documentation

## 2016-06-26 DIAGNOSIS — R109 Unspecified abdominal pain: Secondary | ICD-10-CM | POA: Diagnosis not present

## 2016-06-26 DIAGNOSIS — I351 Nonrheumatic aortic (valve) insufficiency: Secondary | ICD-10-CM | POA: Diagnosis not present

## 2016-06-26 DIAGNOSIS — Z853 Personal history of malignant neoplasm of breast: Secondary | ICD-10-CM | POA: Diagnosis not present

## 2016-06-26 DIAGNOSIS — K5790 Diverticulosis of intestine, part unspecified, without perforation or abscess without bleeding: Secondary | ICD-10-CM | POA: Diagnosis not present

## 2016-06-26 DIAGNOSIS — Z1211 Encounter for screening for malignant neoplasm of colon: Secondary | ICD-10-CM

## 2016-06-26 DIAGNOSIS — M159 Polyosteoarthritis, unspecified: Secondary | ICD-10-CM | POA: Insufficient documentation

## 2016-06-26 DIAGNOSIS — I129 Hypertensive chronic kidney disease with stage 1 through stage 4 chronic kidney disease, or unspecified chronic kidney disease: Secondary | ICD-10-CM | POA: Diagnosis not present

## 2016-06-26 HISTORY — DX: Myoneural disorder, unspecified: G70.9

## 2016-06-26 HISTORY — PX: COLONOSCOPY: SHX5424

## 2016-06-26 SURGERY — COLONOSCOPY
Anesthesia: Monitor Anesthesia Care

## 2016-06-26 MED ORDER — PROPOFOL 500 MG/50ML IV EMUL
INTRAVENOUS | Status: DC | PRN
  Administered 2016-06-26: 200 ug/kg/min via INTRAVENOUS

## 2016-06-26 MED ORDER — PROPOFOL 10 MG/ML IV BOLUS
INTRAVENOUS | Status: DC | PRN
  Administered 2016-06-26 (×2): 10 mg via INTRAVENOUS

## 2016-06-26 MED ORDER — LIDOCAINE 2% (20 MG/ML) 5 ML SYRINGE
INTRAMUSCULAR | Status: AC
  Filled 2016-06-26: qty 5

## 2016-06-26 MED ORDER — PROPOFOL 10 MG/ML IV BOLUS
INTRAVENOUS | Status: AC
  Filled 2016-06-26: qty 40

## 2016-06-26 MED ORDER — SODIUM CHLORIDE 0.9 % IV SOLN: INTRAVENOUS | Status: DC

## 2016-06-26 MED ORDER — LIDOCAINE HCL (CARDIAC) 20 MG/ML IV SOLN
INTRAVENOUS | Status: DC | PRN
  Administered 2016-06-26: 50 mg via INTRATRACHEAL

## 2016-06-26 MED ORDER — LACTATED RINGERS IV SOLN
INTRAVENOUS | Status: DC
  Administered 2016-06-26: 1000 mL via INTRAVENOUS

## 2016-06-26 MED ORDER — ONDANSETRON HCL 4 MG/2ML IJ SOLN
INTRAMUSCULAR | Status: DC | PRN
  Administered 2016-06-26: 4 mg via INTRAVENOUS

## 2016-06-26 MED ORDER — ONDANSETRON HCL 4 MG/2ML IJ SOLN
INTRAMUSCULAR | Status: AC
  Filled 2016-06-26: qty 2

## 2016-06-26 NOTE — Discharge Instructions (Signed)

## 2016-06-26 NOTE — Anesthesia Preprocedure Evaluation (Addendum)
Anesthesia Evaluation  Patient identified by MRN, date of birth, ID band Patient awake    Reviewed: Allergy & Precautions, NPO status , Patient's Chart, lab work & pertinent test results  Airway Mallampati: II  TM Distance: >3 FB Neck ROM: Full    Dental no notable dental hx.    Pulmonary neg pulmonary ROS,    Pulmonary exam normal breath sounds clear to auscultation       Cardiovascular hypertension, Pt. on medications and Pt. on home beta blockers +CHF  Normal cardiovascular exam+ Valvular Problems/Murmurs  Rhythm:Regular Rate:Normal  ECHO 03/26/16: Study Conclusions  - Left ventricle: The cavity size was normal. Wall thickness was   increased in a pattern of mild LVH. Systolic function was   moderately to severely reduced. The estimated ejection fraction   was in the range of 30% to 35%. Diffuse hypokinesis. Doppler   parameters are consistent with abnormal left ventricular   relaxation (grade 1 diastolic dysfunction). - Aortic valve: There was no stenosis. There was trivial   regurgitation. - Mitral valve: Mildly calcified annulus. Normal thickness leaflets   . - Left atrium: The atrium was mildly dilated. - Right ventricle: The cavity size was normal. Systolic function   was normal. - Pulmonary arteries: No complete TR Doppler jet so unable to   estimate PA systolic pressure. - Inferior vena cava: The vessel was dilated. The respirophasic   diameter changes were blunted (< 50%), consistent with elevated   central venous pressure. - Pericardium, extracardiac: A trivial pericardial effusion was   identified.   Neuro/Psych  Neuromuscular disease negative psych ROS   GI/Hepatic negative GI ROS, Neg liver ROS,   Endo/Other  negative endocrine ROS  Renal/GU Renal disease  negative genitourinary   Musculoskeletal  (+) Arthritis ,   Abdominal   Peds negative pediatric ROS (+)  Hematology negative hematology  ROS (+)   Anesthesia Other Findings   Reproductive/Obstetrics negative OB ROS                            Anesthesia Physical Anesthesia Plan  ASA: III  Anesthesia Plan: MAC   Post-op Pain Management:    Induction: Intravenous  Airway Management Planned: Natural Airway  Additional Equipment:   Intra-op Plan:   Post-operative Plan:   Informed Consent: I have reviewed the patients History and Physical, chart, labs and discussed the procedure including the risks, benefits and alternatives for the proposed anesthesia with the patient or authorized representative who has indicated his/her understanding and acceptance.   Dental advisory given  Plan Discussed with: CRNA  Anesthesia Plan Comments:         Anesthesia Quick Evaluation

## 2016-06-26 NOTE — Anesthesia Postprocedure Evaluation (Signed)
Anesthesia Post Note  Patient: Carla Bennett  Procedure(s) Performed: Procedure(s) (LRB): COLONOSCOPY (N/A)  Patient location during evaluation: PACU Anesthesia Type: MAC Level of consciousness: awake and alert Pain management: pain level controlled Vital Signs Assessment: post-procedure vital signs reviewed and stable Respiratory status: spontaneous breathing, nonlabored ventilation, respiratory function stable and patient connected to nasal cannula oxygen Cardiovascular status: stable and blood pressure returned to baseline Anesthetic complications: no    Last Vitals:  Vitals:   06/26/16 1020 06/26/16 1030  BP: (!) 151/61 (!) 135/47  Pulse: 70 68  Resp: (!) 24 19  Temp:      Last Pain:  Vitals:   06/26/16 1005  TempSrc: Oral                 [name]

## 2016-06-26 NOTE — Op Note (Signed)
[facility] Patient Name: Carla Bennett Procedure Date: 06/26/2016 MRN: [id number] Attending MD: Docia Chuck. Henrene Pastor , MD Date of Birth: Jun 16, 1935 CSN: [id number] Age: 81 Admit Type: Outpatient Procedure:                Colonoscopy Indications:              Abdominal pain, Abnormal CT of the GI tract(? 3cm                            cecal abnormality) Providers:                Docia Chuck. Henrene Pastor, MD, Vista Lawman, RN, [facility], Technician, Virgia Land, CRNA Referring MD:              Medicines:                Monitored Anesthesia Care Complications:            No immediate complications. Estimated blood loss:                            None. Estimated Blood Loss:     Estimated blood loss: none. Procedure:                Pre-Anesthesia Assessment:                           - Prior to the procedure, a History and Physical                            was performed, and patient medications and                            allergies were reviewed. The patient's tolerance of                            previous anesthesia was also reviewed. The risks                            and benefits of the procedure and the sedation                            options and risks were discussed with the patient.                            All questions were answered, and informed consent                            was obtained. Prior Anticoagulants: The patient has                            taken no previous anticoagulant or antiplatelet  agents. ASA Grade Assessment: III - A patient with                            severe systemic disease. After reviewing the risks                            and benefits, the patient was deemed in                            satisfactory condition to undergo the procedure.                           After obtaining informed consent, the colonoscope                            was passed under direct  vision. Throughout the                            procedure, the patient's blood pressure, pulse, and                            oxygen saturations were monitored continuously. The                            EC-[id number] ([id number]) scope was introduced through                            the anus and advanced to the the cecum, identified                            by appendiceal orifice and ileocecal valve. The                            ileocecal valve, appendiceal orifice, and rectum                            were photographed. The quality of the bowel                            preparation was excellent. The colonoscopy was                            performed without difficulty. The patient tolerated                            the procedure well. The bowel preparation used was                            SUPREP. Scope In: 9:46:56 AM Scope Out: 9:59:32 AM Scope Withdrawal Time: 0 hours 6 minutes 35 seconds  Total Procedure Duration: 0 hours 12 minutes 36 seconds  Findings:      Multiple small and large-mouthed diverticula were found in the left       colon and right colon.  Internal hemorrhoids were found during retroflexion.      The exam was otherwise without abnormality on direct and retroflexion       views. Impression:               - Diverticulosis                           - Internal hemorrhoids                           - Otherwise normal colonoscopy. No cecal                            abnormality. Moderate Sedation:      none Recommendation:           - Repeat colonoscopy is not recommended for                            surveillance.                           - Patient has a contact number available for                            emergencies. The signs and symptoms of potential                            delayed complications were discussed with the                            patient. Return to normal activities tomorrow.                            Written discharge  instructions were provided to the                            patient.                           - Resume previous diet.                           - Continue present medications.                           - GI follow-up as needed Procedure Code(s):        --- Professional ---                           [phone], Colonoscopy, flexible; diagnostic, including                            collection of specimen(s) by brushing or washing,                            when performed (separate procedure) Diagnosis Code(s):        --- Professional ---  R10.9, Unspecified abdominal pain                           R93.3, Abnormal findings on diagnostic imaging of                            other parts of digestive tract CPT copyright 2016 American Medical Association. All rights reserved. The codes documented in this report are preliminary and upon coder review may  be revised to meet current compliance requirements. Docia Chuck. Henrene Pastor, MD 06/26/2016 10:08:34 AM This report has been signed electronically. Number of Addenda: 0

## 2016-06-26 NOTE — Transfer of Care (Signed)
Immediate Anesthesia Transfer of Care Note  Patient: Carla Bennett  Procedure(s) Performed: Procedure(s): COLONOSCOPY (N/A)  Patient Location: PACU  Anesthesia Type:MAC  Level of Consciousness:  sedated, patient cooperative and responds to stimulation  Airway & Oxygen Therapy:Patient Spontanous Breathing and Patient connected to face mask oxgen  Post-op Assessment:  Report given to PACU RN and Post -op Vital signs reviewed and stable  Post vital signs:  Reviewed and stable  Last Vitals:  Vitals:   06/26/16 0848  BP: (!) 140/55  Temp: 67.7 C    Complications: No apparent anesthesia complications

## 2016-06-26 NOTE — H&P (Signed)
HISTORY OF PRESENT ILLNESS:  Carla Bennett is a 81 y.o. female with multiple medical problems who presents today for colonoscopy after being evaluated in the office for lower abdominal pain. Has had previous colonoscopy as outlined. CT scan did not find a cause for pain but found a 3 cm lesion at the cecum of uncertain etiology. This needs clarification. She is for colonoscopy today. No interval problems. The examination plan at the hospital secondary to low ejection fraction and history of CHF.  REVIEW OF SYSTEMS:  All non-GI ROS negative upon review  Past Medical History:  Diagnosis Date  . Allergy   . Aortic regurgitation   . Arthritis    hands, neck  . Basal cell carcinoma   . Benign neoplasm of colon   . Breast cancer of upper-outer quadrant of left female breast ([place]) 02/23/2015   4'16-left breast cancer-surgery, chemo, radiation-[name] follows every 3 months  . Cataract    beginning stages-monitoring by MD  . Cholelithiasis    resolved with surgery  . Chronic systolic CHF (congestive heart failure) ([place]) 02/26/2016   A.  Echo 02/21/16:  Mild LVH, EF 20-25%, diff HK, Gr 3 DD, mod AI, mod to severe MR, mod LAE, mod redcued RVSF, mild RAE, mod TR, PASP 53 mmHg, small pericardial effusion, mod L pleural effusion  //  B. Echo 6/17:  Mild LVH EF 30-35%, diffuse HK, grade 1 diastolic dysfunction, trivial AI, MAC, mild LAE, normal RVSF, trivial pericardial effusion    . Cystocele   . Diverticulitis   . Diverticulosis of colon (without mention of hemorrhage)   . Gout    resolved -  . Hearing loss    some hearing loss in right ear- no hearing aids  . Heart murmur   . Hematuria    negative urology work up  . History of blood transfusion 1971   x 1 with D&C -   . Hx of abnormal cervical Pap smear    LEEP-CIN I, negative margins and ECC  . Hypercalcemia   . Hypertension   . Mitral regurgitation   . 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  . OA  (osteoarthritis) of hip    right  . Other issue of medical certificates    uterine myomata  . Parathyroid abnormality (H[facility])    being followed Dr. Barbette Hair  . Radiation 10/01/15-11/16/15   left breast axillary and supraclavicular reigion 45 gray, left breast 50.4 gray, lumpectomy cavity boost 10 gray  . Renal disease 3/16   Stage 3 kidney disease-seeing Dr Buddy Duty and Dr Marval Regal  . Uterine prolaps    resolved after surgery  . Vitamin D deficiency     Past Surgical History:  Procedure Laterality Date  . BREAST LUMPECTOMY Left 08/2015  . CHOLECYSTECTOMY    . COLONOSCOPY  01/18/2013   [name]  . CYSTOCELE REPAIR  08/2010   Rectocele, vault prolapse  . DILATION AND CURETTAGE OF UTERUS     x2  . PORT-A-CATH REMOVAL Right 08/23/2015   Procedure: REMOVAL PORT-A-CATH;  Surgeon: Erroll Luna, MD;  Location: [facility];  Service: General;  Laterality: Right;  . PORTACATH PLACEMENT Right 03/20/2015   Procedure: INSERTION PORT-A-CATH;  Surgeon: Erroll Luna, MD;  Location: [facility];  Service: General;  Laterality: Right;  . RADIOACTIVE SEED GUIDED MASTECTOMY WITH AXILLARY SENTINEL LYMPH NODE BIOPSY Left 08/23/2015   Procedure: LEFT BREAST PARTIAL MASTECTOMY WITH SENTINEL LYMPH NODE MAPPING;  Surgeon: Erroll Luna,  MD;  Location: [facility];  Service: General;  Laterality: Left;  . TONSILLECTOMY AND ADENOIDECTOMY    . TOTAL VAGINAL HYSTERECTOMY     secondary to prolapse, ovaries not removed    Social History Sharee L Mooneyham  reports that she has never smoked. She has never used smokeless tobacco. She reports that she drinks alcohol. She reports that she does not use drugs.  family history includes Alzheimer's disease in her mother; Breast cancer (age of onset: 35) in her sister; Cancer in her maternal uncle; Cancer (age of onset: 22) in her cousin and maternal uncle; Lung cancer in her mother; Lung cancer (age of onset: 1) in her father; Multiple sclerosis in  her sister; Parkinson's disease in her mother; Prostate cancer (age of onset: 1) in her maternal uncle.  No Known Allergies     PHYSICAL EXAMINATION: Vital signs: BP (!) 140/55   Temp 97.5 F (36.4 C)   Ht 5' (1.524 m)   Wt 133 lb (60.3 kg)   LMP 10/20/1996   SpO2 99%   BMI 25.97 kg/m   Constitutional: generally well-appearing, no acute distress Psychiatric: alert and oriented x3, cooperative Eyes: extraocular movements intact, anicteric, conjunctiva pink Mouth: oral pharynx moist, no lesions Neck: supple no lymphadenopathy Cardiovascular: heart regular rate and rhythm, no murmur Lungs: clear to auscultation bilaterally Abdomen: soft, nontender, nondistended, no obvious ascites, no peritoneal signs, normal bowel sounds, no organomegaly Rectal: Fair until colonoscopy Extremities: no lower extremity edema bilaterally Skin: no lesions on visible extremities Neuro: No focal deficits.   ASSESSMENT:  #1. Lower abdominal pain. Question adhesions. Question spasm #2. Abnormality on CT in the cecum. These clarified  PLAN:  #1. Colonoscopy.The nature of the procedure, as well as the risks, benefits, and alternatives were carefully and thoroughly reviewed with the patient. Ample time for discussion and questions allowed. The patient understood, was satisfied, and agreed to proceed.

## 2016-06-27 ENCOUNTER — Encounter (HOSPITAL_COMMUNITY): Payer: Self-pay | Admitting: Internal Medicine

## 2016-07-02 ENCOUNTER — Ambulatory Visit
Admission: RE | Admit: 2016-07-02 | Discharge: 2016-07-02 | Disposition: A | Discharge status: Home / Self Care | Payer: Medicare Other | Source: Ambulatory Visit | Attending: Radiation Oncology | Admitting: Radiation Oncology

## 2016-07-02 DIAGNOSIS — Z79899 Other long term (current) drug therapy: Secondary | ICD-10-CM | POA: Diagnosis not present

## 2016-07-02 DIAGNOSIS — Z853 Personal history of malignant neoplasm of breast: Secondary | ICD-10-CM | POA: Insufficient documentation

## 2016-07-02 DIAGNOSIS — Z08 Encounter for follow-up examination after completed treatment for malignant neoplasm: Secondary | ICD-10-CM | POA: Insufficient documentation

## 2016-07-02 DIAGNOSIS — C50412 Malignant neoplasm of upper-outer quadrant of left female breast: Secondary | ICD-10-CM

## 2016-07-02 NOTE — Progress Notes (Signed)
Follow up s/p rada tx left breast, no c/o pain, had mammogram last month at [facility] ws fine' will call for a copy, patient had colonoscopy 06/25/16 neg, was having abdominal pains, and throwing up, has resolved 1:43 PM BP (!) 160/70 (BP Location: Right Arm, Patient Position: Sitting, Cuff Size: Normal)   Pulse 62   Temp 97.7 F (36.5 C) (Oral)   Resp 16   Ht 5' (1.524 m)   Wt 138 lb 8 oz (62.8 kg)   LMP 10/20/1996   BMI 27.05 kg/m   Wt Readings from Last 3 Encounters:  07/02/16 138 lb 8 oz (62.8 kg)  06/26/16 133 lb (60.3 kg)  06/09/16 136 lb 12.8 oz (62.1 kg)

## 2016-07-02 NOTE — Progress Notes (Signed)
  Radiation Oncology         [phone] ________________________________  Name: Carla Bennett MRN: [id number]  Date: 07/02/2016  DOB: [birth year]     Follow-Up Visit Note  CC: Thressa Sheller, MD  Erroll Luna, MD  Diagnosis: Stage IIB (ypT2, ypN1a, cM0) invasive ductal carcinoma of the left breast (triple negative)  Interval Since Last Radiation Treatment: 8 months  10/01/2015 through 11/16/2015: Left breast axillary and supraclavicular region, 45 gray in 25 fractions, the left breast received 3 additional treatments for a cumulative dose of 50.4 gray. Lumpectomy cavity boost 10 gray in 5 fractions  Narrative:  The patient returns today for routine follow-up. The patient has no complaints of pain. The patient reports she had a mammogram last month at [facility] and states "it was fine." She will call for a copy. The patient had a colonoscopy on 06/25/16 and this was negative. She reports abdominal pains and emesis, but this has since resolved.  ALLERGIES:  has No Known Allergies.  Meds: Current Outpatient Prescriptions  Medication Sig Dispense Refill  . B Complex Vitamins (VITAMIN B COMPLEX PO) Take 1 tablet by mouth daily.     . carvedilol (COREG) 12.5 MG tablet Take 1 tablet (12.5 mg total) by mouth 2 (two) times daily. 180 tablet 3  . furosemide (LASIX) 20 MG tablet Take 1 tablet (20 mg total) by mouth daily. 30 tablet 6  . hyoscyamine (LEVSIN, ANASPAZ) 0.125 MG tablet Take one every 4-6 hours as needed for abdominal pain 15 tablet 0  . lisinopril (PRINIVIL,ZESTRIL) 40 MG tablet Take 40 mg by mouth daily.     Marland Kitchen loratadine (CLARITIN) 10 MG tablet Take 10 mg by mouth daily.    Marland Kitchen spironolactone (ALDACTONE) 25 MG tablet Take 0.5 tablets (12.5 mg total) by mouth daily. 30 tablet 11   No current facility-administered medications for this encounter.    Facility-Administered Medications Ordered in Other Encounters  Medication Dose Route Frequency Provider Last Rate Last Dose  .  sodium chloride 0.9 % injection 10 mL  10 mL Intracatheter PRN Nicholas Lose, MD   10 mL at 06/07/15 1559    Physical Findings: The patient is in no acute distress. Patient is alert and oriented.  height is 5' (1.524 m) and weight is 138 lb 8 oz (62.8 kg). Her oral temperature is 97.7 F (36.5 C). Her blood pressure is 160/70 (abnormal) and her pulse is 62. Her respiration is 16.   Lungs are clear to auscultation bilaterally. Heart has regular rate and rhythm. No palpable supraclavicular or axillary adenopathy. Some continued mild edema in the left breast. No dominant mass appreciated. No nipple discharge or bleeding.  Lab Findings: Lab Results  Component Value Date   WBC 7.8 05/15/2016   HGB 15.4 05/15/2016   HCT 46.7 (H) 05/15/2016   MCV 86.6 05/15/2016   PLT 258 05/15/2016    Radiographic Findings: No results found.  Impression: No signs of recurrence on clinical exam today.   Plan: PRN follow up with radiation oncology. The patient will continue close follow up with medical oncology and surgery.  -----------------------------------  Blair Promise, PhD, MD  This document serves as a record of services personally performed by Gery Pray, MD. It was created on his behalf by Darcus Austin, a trained medical scribe. The creation of this record is based on the scribe's personal observations and the provider's statements to them. This document has been checked and approved by the attending provider.

## 2016-07-03 DIAGNOSIS — N183 Chronic kidney disease, stage 3 (moderate): Secondary | ICD-10-CM | POA: Diagnosis not present

## 2016-07-03 DIAGNOSIS — N2581 Secondary hyperparathyroidism of renal origin: Secondary | ICD-10-CM | POA: Diagnosis not present

## 2016-07-03 DIAGNOSIS — I10 Essential (primary) hypertension: Secondary | ICD-10-CM | POA: Diagnosis not present

## 2016-07-03 DIAGNOSIS — C50919 Malignant neoplasm of unspecified site of unspecified female breast: Secondary | ICD-10-CM | POA: Diagnosis not present

## 2016-07-03 DIAGNOSIS — I429 Cardiomyopathy, unspecified: Secondary | ICD-10-CM | POA: Diagnosis not present

## 2016-07-03 DIAGNOSIS — E210 Primary hyperparathyroidism: Secondary | ICD-10-CM | POA: Diagnosis not present

## 2016-07-03 DIAGNOSIS — I313 Pericardial effusion (noninflammatory): Secondary | ICD-10-CM | POA: Diagnosis not present

## 2016-07-04 ENCOUNTER — Ambulatory Visit: Payer: Medicare Other | Admitting: Cardiology

## 2016-07-08 ENCOUNTER — Other Ambulatory Visit (HOSPITAL_COMMUNITY): Payer: Self-pay | Admitting: Nephrology

## 2016-07-08 ENCOUNTER — Other Ambulatory Visit: Payer: Self-pay | Admitting: Nephrology

## 2016-07-08 DIAGNOSIS — N2581 Secondary hyperparathyroidism of renal origin: Secondary | ICD-10-CM

## 2016-07-15 ENCOUNTER — Encounter (HOSPITAL_COMMUNITY)
Admission: RE | Admit: 2016-07-15 | Discharge: 2016-07-15 | Disposition: A | Discharge status: Home / Self Care | Payer: Medicare Other | Source: Ambulatory Visit | Attending: Nephrology | Admitting: Nephrology

## 2016-07-15 ENCOUNTER — Ambulatory Visit: Payer: Medicare Other | Admitting: Internal Medicine

## 2016-07-15 ENCOUNTER — Ambulatory Visit (HOSPITAL_COMMUNITY)
Admission: RE | Admit: 2016-07-15 | Discharge: 2016-07-15 | Disposition: A | Discharge status: Home / Self Care | Payer: Medicare Other | Source: Ambulatory Visit | Attending: Nephrology | Admitting: Nephrology

## 2016-07-15 DIAGNOSIS — N2581 Secondary hyperparathyroidism of renal origin: Secondary | ICD-10-CM

## 2016-07-15 DIAGNOSIS — E210 Primary hyperparathyroidism: Secondary | ICD-10-CM | POA: Insufficient documentation

## 2016-07-15 MED ORDER — TECHNETIUM TC 99M SESTAMIBI GENERIC - CARDIOLITE
25.0000 | Freq: Once | INTRAVENOUS | Status: AC | PRN
  Administered 2016-07-15: 25 via INTRAVENOUS

## 2016-07-16 ENCOUNTER — Ambulatory Visit (INDEPENDENT_AMBULATORY_CARE_PROVIDER_SITE_OTHER): Payer: Medicare Other | Admitting: Cardiology

## 2016-07-16 ENCOUNTER — Encounter: Payer: Self-pay | Admitting: Cardiology

## 2016-07-16 VITALS — BP 118/58 | HR 80 | Ht 60.0 in | Wt 140.2 lb

## 2016-07-16 DIAGNOSIS — I313 Pericardial effusion (noninflammatory): Secondary | ICD-10-CM

## 2016-07-16 DIAGNOSIS — I319 Disease of pericardium, unspecified: Secondary | ICD-10-CM | POA: Diagnosis not present

## 2016-07-16 DIAGNOSIS — I420 Dilated cardiomyopathy: Secondary | ICD-10-CM

## 2016-07-16 DIAGNOSIS — I5022 Chronic systolic (congestive) heart failure: Secondary | ICD-10-CM | POA: Diagnosis not present

## 2016-07-16 DIAGNOSIS — I3139 Other pericardial effusion (noninflammatory): Secondary | ICD-10-CM

## 2016-07-16 NOTE — Progress Notes (Signed)
Cardiology Office Note    Date:  07/16/2016   ID:  Carlis Stable Brittingham, DOB [birth year], MRN [id number]  PCP:  Thressa Sheller, MD  Cardiologist:   Candee Furbish, MD     History of Present Illness:  Carla Krider Lyerly is a 81 y.o. female here for Follow-up of systolic heart failure, cardiomyopathy, previous chemotherapy for breast cancer, pericardial effusion.  She had an echo performed on 03/13/15 which at that time showed no evidence of pericardial effusion. Mild mitral and aortic regurgitation noted. Normal ejection fraction. This was pre-chemotherapy to breast cancer.  Moderate-sized pericardial effusion was personally viewed on CT scan from 02/07/16. Aortic atherosclerosis and mild coronary artery atherosclerosis was also noted. Pleural effusions were also noted.  In general, she did feel weakness and fatigue surrounding her chemotherapy/radiation however in March for instance she was feeling much better from a dyspnea standpoint. Really did not have any significant issues with that she states. Her shortness of breath when going up stairs is much improved, fairly relieved She denies any chest pain, syncope, bleeding, fevers, orthopnea, and explained weight loss, night sweats  On her echocardiogram originally in April, her ejection fraction was markedly reduced at 20-25%. She had been treated with Adriamycin. This also showed moderate pericardial effusion.  A subsequent follow-up echo 1 month later showed ejection fraction of 35%. Improved. Effusion was trace.  She has been feeling some mild dizziness over the last week or so since the heat wave. She no longer has any edema and she had been taking Lasix 40/20 daily.    Past Medical History:  Diagnosis Date  . Allergy   . Aortic regurgitation   . Arthritis    hands, neck  . Basal cell carcinoma   . Benign neoplasm of colon   . Breast cancer of upper-outer quadrant of left female breast ([place]) 02/23/2015   4'16-left breast  cancer-surgery, chemo, radiation-[name] follows every 3 months  . Cataract    beginning stages-monitoring by MD  . Cholelithiasis    resolved with surgery  . Chronic systolic CHF (congestive heart failure) ([place]) 02/26/2016   A.  Echo 02/21/16:  Mild LVH, EF 20-25%, diff HK, Gr 3 DD, mod AI, mod to severe MR, mod LAE, mod redcued RVSF, mild RAE, mod TR, PASP 53 mmHg, small pericardial effusion, mod L pleural effusion  //  B. Echo 6/17:  Mild LVH EF 30-35%, diffuse HK, grade 1 diastolic dysfunction, trivial AI, MAC, mild LAE, normal RVSF, trivial pericardial effusion    . Cystocele   . Diverticulitis   . Diverticulosis of colon (without mention of hemorrhage)   . Gout    resolved -  . Hearing loss    some hearing loss in right ear- no hearing aids  . Heart murmur   . Hematuria    negative urology work up  . History of blood transfusion 1971   x 1 with D&C -   . Hx of abnormal cervical Pap smear    LEEP-CIN I, negative margins and ECC  . Hypercalcemia   . Hypertension   . Mitral regurgitation   . 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  . OA (osteoarthritis) of hip    right  . Other issue of medical certificates    uterine myomata  . Parathyroid abnormality (HCC)    being followed Dr. Barbette Hair  . Radiation 10/01/15-11/16/15   left breast axillary and supraclavicular reigion 45 gray, left breast 50.4 gray, lumpectomy  cavity boost 10 gray  . Renal disease 3/16   Stage 3 kidney disease-seeing Dr Buddy Duty and Dr Marval Regal  . Uterine prolaps    resolved after surgery  . Vitamin D deficiency     Past Surgical History:  Procedure Laterality Date  . BREAST LUMPECTOMY Left 08/2015  . CHOLECYSTECTOMY    . COLONOSCOPY  01/18/2013   [name]  . COLONOSCOPY N/A 06/26/2016   Procedure: COLONOSCOPY;  Surgeon: Irene Shipper, MD;  Location: [facility];  Service: Endoscopy;  Laterality: N/A;  . CYSTOCELE REPAIR  08/2010   Rectocele, vault prolapse  . DILATION AND  CURETTAGE OF UTERUS     x2  . PORT-A-CATH REMOVAL Right 08/23/2015   Procedure: REMOVAL PORT-A-CATH;  Surgeon: Erroll Luna, MD;  Location: [place];  Service: General;  Laterality: Right;  . PORTACATH PLACEMENT Right 03/20/2015   Procedure: INSERTION PORT-A-CATH;  Surgeon: Erroll Luna, MD;  Location: [facility];  Service: General;  Laterality: Right;  . RADIOACTIVE SEED GUIDED MASTECTOMY WITH AXILLARY SENTINEL LYMPH NODE BIOPSY Left 08/23/2015   Procedure: LEFT BREAST PARTIAL MASTECTOMY WITH SENTINEL LYMPH NODE MAPPING;  Surgeon: Erroll Luna, MD;  Location: [facility];  Service: General;  Laterality: Left;  . TONSILLECTOMY AND ADENOIDECTOMY    . TOTAL VAGINAL HYSTERECTOMY     secondary to prolapse, ovaries not removed    Current Medications: Outpatient Medications Prior to Visit  Medication Sig Dispense Refill  . B Complex Vitamins (VITAMIN B COMPLEX PO) Take 1 tablet by mouth daily.     . carvedilol (COREG) 12.5 MG tablet Take 1 tablet (12.5 mg total) by mouth 2 (two) times daily. 180 tablet 3  . furosemide (LASIX) 20 MG tablet Take 1 tablet (20 mg total) by mouth daily. 30 tablet 6  . hyoscyamine (LEVSIN, ANASPAZ) 0.125 MG tablet Take one every 4-6 hours as needed for abdominal pain 15 tablet 0  . lisinopril (PRINIVIL,ZESTRIL) 40 MG tablet Take 40 mg by mouth daily.     Marland Kitchen loratadine (CLARITIN) 10 MG tablet Take 10 mg by mouth daily.    Marland Kitchen spironolactone (ALDACTONE) 25 MG tablet Take 0.5 tablets (12.5 mg total) by mouth daily. 30 tablet 11   Facility-Administered Medications Prior to Visit  Medication Dose Route Frequency Provider Last Rate Last Dose  . sodium chloride 0.9 % injection 10 mL  10 mL Intracatheter PRN Nicholas Lose, MD   10 mL at 06/07/15 1559     Allergies:   Review of patient's allergies indicates no known allergies.   Social History   Social History  . Marital status: Widowed    Spouse name: N/A  . Number of children: 4  . Years of  education: N/A   Occupational History  . retired    Social History Main Topics  . Smoking status: Never Smoker  . Smokeless tobacco: Never Used  . Alcohol use Yes     Comment: occasionally  wine/beer  . Drug use: No  . Sexual activity: No     Comment: Vaginal hysterectomy   Other Topics Concern  . None   Social History Narrative  . None     Family History:  The patient's family history includes Alzheimer's disease in her mother; Breast cancer (age of onset: 35) in her sister; Cancer in her maternal uncle; Cancer (age of onset: 3) in her cousin and maternal uncle; Lung cancer in her mother; Lung cancer (age of onset: 50) in her father; Multiple sclerosis in her sister; Parkinson's  disease in her mother; Prostate cancer (age of onset: 35) in her maternal uncle.   ROS:   Please see the history of present illness.    ROS All other systems reviewed and are negative.   PHYSICAL EXAM:   VS:  BP (!) 118/58   Pulse 80   Ht 5' (1.524 m)   Wt 140 lb 4 oz (63.6 kg)   LMP 10/20/1996   BMI 27.39 kg/m    GEN: Well nourished, well developed, in no acute distress HEENT: normal Neck: no significant JVD, carotid bruits, or masses Cardiac: RRR; no significant murmurs, rubs, or gallops,no edema  Respiratory:  clear to auscultation bilaterally, normal work of breathing GI: soft, nontender, nondistended, + BS MS: no deformity or atrophy Skin: warm and dry, no rash Neuro:  Alert and Oriented x 3, Strength and sensation are intact Psych: euthymic mood, full affect  Wt Readings from Last 3 Encounters:  07/16/16 140 lb 4 oz (63.6 kg)  07/02/16 138 lb 8 oz (62.8 kg)  06/26/16 133 lb (60.3 kg)      Studies/Labs Reviewed:   EKG:  EKG is not ordered today.  The ekg from 02/07/16 shows sinus rhythm, lower voltage, poor R-wave progression, no other significant abnormality.  Recent Labs: 05/15/2016: ALT 12; BUN 17.1; Creatinine 1.2; HGB 15.4; Platelets 258; Potassium 4.4; Sodium 143   Lipid  Panel No results found for: CHOL, TRIG, HDL, CHOLHDL, VLDL, LDLCALC, LDLDIRECT  Additional studies/ records that were reviewed today include:  CT scan prior old notes, lab work, prior echocardiogram    ASSESSMENT:    1. Chronic systolic CHF (congestive heart failure) ([place])   2. Dilated cardiomyopathy ([place])   3. Pericardial effusion      PLAN:  In order of problems listed above:  Pericardial effusion    - Seen on CT scan 02/07/16 moderate in size.  - Currently trivial on most recent echocardiogram. Reassuring.  - I'm aware of her creatinine of 1.3. She is following with Dr. Marval Regal.   Aortic atherosclerosis, mild coronary atherosclerosis seen on CT  - Secondary prevention.  Chronic systolic heart failure  - Feeling well.  Lasix to 20 mg once a day   - Continue carvedilol and lisinopril and spironolactone.  - Currently NYHA class I. No symptoms. Doing well.  - Likely nonischemic cardiomyopathy, likely from chemotherapy or perhaps viral mediated.  - In 6 months, we may recheck echocardiogram EF.  - I personally reviewed CT scan of chest, no obvious coronary calcium detected. EF prior to breast cancer was normal. This minimizes the likelihood of ischemic cardiomyopathy.  - We will hold off from cardiac catheterization at this time. EF has improved from 20 up to 35.   Medication Adjustments/Labs and Tests Ordered: Current medicines are reviewed at length with the patient today.  Concerns regarding medicines are outlined above.  Medication changes, Labs and Tests ordered today are listed in the Patient Instructions below. Patient Instructions  Medication Instructions:  The current medical regimen is effective;  continue present plan and medications.  Follow-Up: Follow up in 6 months with Dr. Marlou Porch.  You will receive a letter in the mail 2 months before you are due.  Please call us when you receive this letter to schedule your follow up appointment.  If you need a refill  on your cardiac medications before your next appointment, please call your pharmacy.  Thank you for choosing [facility]!!        Signed, Candee Furbish, MD  07/16/2016 10:30 AM    [address], [place], [place]  [address] Phone: [phone]; Fax: [phone]

## 2016-07-16 NOTE — Patient Instructions (Signed)

## 2016-08-14 DIAGNOSIS — E559 Vitamin D deficiency, unspecified: Secondary | ICD-10-CM | POA: Diagnosis not present

## 2016-08-14 DIAGNOSIS — N390 Urinary tract infection, site not specified: Secondary | ICD-10-CM | POA: Diagnosis not present

## 2016-08-14 DIAGNOSIS — E210 Primary hyperparathyroidism: Secondary | ICD-10-CM | POA: Diagnosis not present

## 2016-08-14 DIAGNOSIS — Z0000 Encounter for general adult medical examination without abnormal findings: Secondary | ICD-10-CM | POA: Diagnosis not present

## 2016-08-14 DIAGNOSIS — I129 Hypertensive chronic kidney disease with stage 1 through stage 4 chronic kidney disease, or unspecified chronic kidney disease: Secondary | ICD-10-CM | POA: Diagnosis not present

## 2016-08-14 DIAGNOSIS — Z23 Encounter for immunization: Secondary | ICD-10-CM | POA: Diagnosis not present

## 2016-08-19 DIAGNOSIS — I129 Hypertensive chronic kidney disease with stage 1 through stage 4 chronic kidney disease, or unspecified chronic kidney disease: Secondary | ICD-10-CM | POA: Diagnosis not present

## 2016-08-19 DIAGNOSIS — E559 Vitamin D deficiency, unspecified: Secondary | ICD-10-CM | POA: Diagnosis not present

## 2016-08-19 DIAGNOSIS — N183 Chronic kidney disease, stage 3 (moderate): Secondary | ICD-10-CM | POA: Diagnosis not present

## 2016-08-19 DIAGNOSIS — I5022 Chronic systolic (congestive) heart failure: Secondary | ICD-10-CM | POA: Diagnosis not present

## 2016-09-25 ENCOUNTER — Encounter: Payer: Self-pay | Admitting: Obstetrics and Gynecology

## 2016-10-02 DIAGNOSIS — N2581 Secondary hyperparathyroidism of renal origin: Secondary | ICD-10-CM | POA: Diagnosis not present

## 2016-10-02 DIAGNOSIS — C50919 Malignant neoplasm of unspecified site of unspecified female breast: Secondary | ICD-10-CM | POA: Diagnosis not present

## 2016-10-02 DIAGNOSIS — E210 Primary hyperparathyroidism: Secondary | ICD-10-CM | POA: Diagnosis not present

## 2016-10-02 DIAGNOSIS — I10 Essential (primary) hypertension: Secondary | ICD-10-CM | POA: Diagnosis not present

## 2016-10-02 DIAGNOSIS — N183 Chronic kidney disease, stage 3 (moderate): Secondary | ICD-10-CM | POA: Diagnosis not present

## 2016-10-02 DIAGNOSIS — I429 Cardiomyopathy, unspecified: Secondary | ICD-10-CM | POA: Diagnosis not present

## 2016-10-02 DIAGNOSIS — I313 Pericardial effusion (noninflammatory): Secondary | ICD-10-CM | POA: Diagnosis not present

## 2016-10-09 DIAGNOSIS — N390 Urinary tract infection, site not specified: Secondary | ICD-10-CM | POA: Diagnosis not present

## 2016-10-14 ENCOUNTER — Other Ambulatory Visit: Payer: Self-pay | Admitting: Nurse Practitioner

## 2016-10-28 DIAGNOSIS — Z853 Personal history of malignant neoplasm of breast: Secondary | ICD-10-CM | POA: Diagnosis not present

## 2016-11-11 NOTE — Progress Notes (Signed)
Received [facility] 3d mm results.

## 2016-11-13 NOTE — Assessment & Plan Note (Signed)
Left breast invasive ductal carcinoma grade 3 ER 0% PR 0% HER-2 negative Ki-67 75%, 2.9 cm mass with microcalcifications plus abnormal lymph nodes biopsy-proven to be breast cancer T2 N1 M0 stage IIB clinical stage S/P Neoadjuvant Dose dense AC folll by Abraxane X 12  left lumpectomy 08/23/2015 : Invasive ductal carcinoma grade 3, 2.8 cm, associated extensive DCIS with comedonecrosis, margins negative,1/2 sentinel nodes positive, ER 0%, PR 0% T2 N1 M0 stage IIB Completed radiation therapy 11/16/2015   I offered the patient adjuvant Xeloda  But she refused.  Hypercalcemia: Ultrasound of the neck had revealed a parathyroid adenoma. I instructed the patient to discuss the ultrasound and calcium levels with her primary care physician. She may need additional imaging and or surgery evaluation given her current degree of hypercalcemia.   Return to clinic in 1 yr

## 2016-11-14 ENCOUNTER — Encounter: Payer: Self-pay | Admitting: Hematology and Oncology

## 2016-11-14 ENCOUNTER — Ambulatory Visit (HOSPITAL_BASED_OUTPATIENT_CLINIC_OR_DEPARTMENT_OTHER): Payer: Medicare Other | Admitting: Hematology and Oncology

## 2016-11-14 ENCOUNTER — Other Ambulatory Visit (HOSPITAL_BASED_OUTPATIENT_CLINIC_OR_DEPARTMENT_OTHER): Payer: Medicare Other

## 2016-11-14 DIAGNOSIS — Z853 Personal history of malignant neoplasm of breast: Secondary | ICD-10-CM

## 2016-11-14 DIAGNOSIS — C50412 Malignant neoplasm of upper-outer quadrant of left female breast: Secondary | ICD-10-CM

## 2016-11-14 DIAGNOSIS — Z171 Estrogen receptor negative status [ER-]: Principal | ICD-10-CM

## 2016-11-14 LAB — COMPREHENSIVE METABOLIC PANEL
ALT: 13 U/L (ref 0–55)
AST: 17 U/L (ref 5–34)
Albumin: 4 g/dL (ref 3.5–5.0)
Alkaline Phosphatase: 96 U/L (ref 40–150)
Anion Gap: 9 mEq/L (ref 3–11)
BUN: 20.4 mg/dL (ref 7.0–26.0)
CALCIUM: 11.5 mg/dL — AB (ref 8.4–10.4)
CHLORIDE: 101 meq/L (ref 98–109)
CO2: 29 mEq/L (ref 22–29)
Creatinine: 1.3 mg/dL — ABNORMAL HIGH (ref 0.6–1.1)
EGFR: 38 mL/min/{1.73_m2} — AB (ref >90)
Glucose: 103 mg/dl (ref 70–140)
POTASSIUM: 4.8 meq/L (ref 3.5–5.1)
Sodium: 139 mEq/L (ref 136–145)
Total Bilirubin: 1.25 mg/dL — ABNORMAL HIGH (ref 0.20–1.20)
Total Protein: 7.2 g/dL (ref 6.4–8.3)

## 2016-11-14 LAB — CBC WITH DIFFERENTIAL/PLATELET
BASO%: 0.4 % (ref 0.0–2.0)
Basophils Absolute: 0.1 10*3/uL (ref 0.0–0.1)
EOS ABS: 0.2 10*3/uL (ref 0.0–0.5)
EOS%: 1.7 % (ref 0.0–7.0)
HCT: 45.7 % (ref 34.8–46.6)
HGB: 15.4 g/dL (ref 11.6–15.9)
LYMPH%: 11.3 % — AB (ref 14.0–49.7)
MCH: 30 pg (ref 25.1–34.0)
MCHC: 33.7 g/dL (ref 31.5–36.0)
MCV: 89.1 fL (ref 79.5–101.0)
MONO#: 0.8 10*3/uL (ref 0.1–0.9)
MONO%: 7.2 % (ref 0.0–14.0)
NEUT%: 79.4 % — AB (ref 38.4–76.8)
NEUTROS ABS: 8.9 10*3/uL — AB (ref 1.5–6.5)
PLATELETS: 262 10*3/uL (ref 145–400)
RBC: 5.13 10*6/uL (ref 3.70–5.45)
RDW: 13.2 % (ref 11.2–14.5)
WBC: 11.2 10*3/uL — ABNORMAL HIGH (ref 3.9–10.3)
lymph#: 1.3 10*3/uL (ref 0.9–3.3)

## 2016-11-14 NOTE — Progress Notes (Signed)
Patient Care Team: Thressa Sheller, MD as PCP - General (Internal Medicine) Erroll Luna, MD as Consulting Physician (General Surgery) Nicholas Lose, MD as Consulting Physician (Hematology and Oncology) Gery Pray, MD as Consulting Physician (Radiation Oncology) Mauro Kaufmann, RN as Registered Nurse Rockwell Germany, RN as Registered Nurse Sylvan Cheese, NP as Nurse Practitioner (Hematology and Oncology)  DIAGNOSIS:  Encounter Diagnosis  Name Primary?  . Malignant neoplasm of upper-outer quadrant of left breast in female, estrogen receptor negative ([place])     SUMMARY OF ONCOLOGIC HISTORY:   Breast cancer of upper-outer quadrant of left female breast ([place])   02/15/2015 Mammogram    Left breast increased density, mass is irregular with microcalcifications measuring 2.9 cm, cyst in the breast as well as abnormal enlarged lymph nodes      02/20/2015 Initial Diagnosis    Left breast biopsy: Invasive ductal carcinoma with DCIS, axillary lymph node biopsy positive, ER 0%, PR 0%, HER-2 negative ratio 1.13 with Ki-67 75%      03/08/2015 Procedure    OvaNext panel [name]) reveals no clinically significant variant at ATM, BARD1, BRCA1, BRCA2, BRIP1, CDH1, CHEK2, EPCAM, MLH1, MRE11A, MSH2, MSH6, MUTYH, NBN, NF1, PALB2, PMS2, PTEN, RAD50, RAD51C, RAD51D, SMARCA4, STK11, and TP53.       03/15/2015 PET scan    2.8 cm solid irregular left breast mass which is hypermetabolic and consistent with known left breast cancer. 2. Weakly positive axillary lymph nodes or equivocal      03/20/2015 Breast MRI    3.1 cm irregular enhancing mass located within the left breast at the 1 o'clock position; 1.6 cm Left axillary LN      03/20/2015 Clinical Stage    Stage IIB: T2 N1      03/22/2015 - 08/02/2015 Neo-Adjuvant Chemotherapy    Neo-adjuvant chemotherapy with dose dense Adriamycin and Cytoxan 4 followed by Abraxane weekly 12      08/06/2015 Breast MRI    Left breast now measures 0.9 x  0.8 x 1.9 cm (previously measured 3.0 x 3.1 x 1.8 cm). Dec size of abnormal Left axill LN      08/23/2015 Surgery    Left lumpectomy: Invasive ductal carcinoma grade 3, 2.8 cm, associated extensive DCIS with comedonecrosis, margins negative,1/2 sentinel nodes positive, ER 0%, PR 0%, HER2/neu repeated and remains negative (ratio 1.13)      08/23/2015 Pathologic Stage    Stage IIB: ypT2 ypN1      10/01/2015 - 11/16/2015 Radiation Therapy    Adjuvant RT: Left breast axillary and supraclavicular region, 45 gray in 25 fractions, the left breast received 3 additional treatments for a cumulative dose of 50.4 gray. Lumpectomy cavity boost 10 gray in 5 fractions      12/27/2015 Survivorship    Survivorship visit completed and copy of care plan provided to patient       CHIEF COMPLIANT: Surveillance of breast cancer  INTERVAL HISTORY: Carla Bennett is a 86 year with above-mentioned history of left breast cancer who was treated with neoadjuvant chemotherapy followed by lumpectomy. Patient still had residual disease as well as 1 positive lymph node. She underwent adjuvant radiation therapy and is currently on surveillance. She denies any lumps or nodules in the breast.She complains of mid back pain as well as decreased energy levels. She is also having problems with abdominal spasms were which she is taking Levsin. These are done spasms can be severe enough where she has to throw up. After that she feels better. She  had a CT angiogram for evaluation of mesenteric vascular Insufficiency and did not have any such issues. There was a cecal abnormality which was evaluated by colonoscopy and was felt to be normal. She had a recent mammogram on the left breast for follow-up of calcifications.  REVIEW OF SYSTEMS:   Constitutional: Denies fevers, chills or abnormal weight loss Eyes: Denies blurriness of vision Ears, nose, mouth, throat, and face: Denies mucositis or sore throat Respiratory: Denies cough,  dyspnea or wheezes Cardiovascular: Denies palpitation, chest discomfort Gastrointestinal: Abdominal spasms, nausea and vomiting  Skin: Denies abnormal skin rashes Lymphatics: Denies new lymphadenopathy or easy bruising Neurological:Denies numbness, tingling or new weaknesses Behavioral/Psych: Mood is stable, no new changes  Extremities: No lower extremity edema Breast:  denies any pain or lumps or nodules in either breasts All other systems were reviewed with the patient and are negative.  I have reviewed the past medical history, past surgical history, social history and family history with the patient and they are unchanged from previous note.  ALLERGIES:  has No Known Allergies.  MEDICATIONS:  Current Outpatient Prescriptions  Medication Sig Dispense Refill  . B Complex Vitamins (VITAMIN B COMPLEX PO) Take 1 tablet by mouth daily.     . carvedilol (COREG) 12.5 MG tablet Take 1 tablet (12.5 mg total) by mouth 2 (two) times daily. 180 tablet 3  . furosemide (LASIX) 20 MG tablet Take 1 tablet (20 mg total) by mouth daily. 30 tablet 6  . hyoscyamine (LEVSIN, ANASPAZ) 0.125 MG tablet Take one every 4-6 hours as needed for abdominal pain 15 tablet 0  . lisinopril (PRINIVIL,ZESTRIL) 40 MG tablet Take 40 mg by mouth daily.     Marland Kitchen loratadine (CLARITIN) 10 MG tablet Take 10 mg by mouth daily.    Marland Kitchen spironolactone (ALDACTONE) 25 MG tablet Take 0.5 tablets (12.5 mg total) by mouth daily. 30 tablet 11   No current facility-administered medications for this visit.     PHYSICAL EXAMINATION: ECOG PERFORMANCE STATUS: 1 - Symptomatic but completely ambulatory  Vitals:   11/14/16 1035  BP: (!) 158/68  Pulse: 77  Resp: 18  Temp: 98 F (36.7 C)   Filed Weights   11/14/16 1035  Weight: 149 lb 8 oz (67.8 kg)    GENERAL:alert, no distress and comfortable SKIN: skin color, texture, turgor are normal, no rashes or significant lesions EYES: normal, Conjunctiva are pink and non-injected, sclera  clear OROPHARYNX:no exudate, no erythema and lips, buccal mucosa, and tongue normal  NECK: supple, thyroid normal size, non-tender, without nodularity LYMPH:  no palpable lymphadenopathy in the cervical, axillary or inguinal LUNGS: clear to auscultation and percussion with normal breathing effort HEART: regular rate & rhythm and no murmurs and no lower extremity edema ABDOMEN:abdomen soft, non-tender and normal bowel sounds MUSCULOSKELETAL:no cyanosis of digits and no clubbing  NEURO: alert & oriented x 3 with fluent speech, no focal motor/sensory deficits EXTREMITIES: No lower extremity edema BREAST: No palpable masses or nodules in either right or left breasts. No palpable axillary supraclavicular or infraclavicular adenopathy no breast tenderness or nipple discharge. (exam performed in the presence of a chaperone)  LABORATORY DATA:  I have reviewed the data as listed   Chemistry      Component Value Date/Time   NA 139 11/14/2016 1021   K 4.8 11/14/2016 1021   CL 101 03/26/2016 1150   CO2 29 11/14/2016 1021   BUN 20.4 11/14/2016 1021   CREATININE 1.3 (H) 11/14/2016 1021      Component  Value Date/Time   CALCIUM 11.5 (H) 11/14/2016 1021   ALKPHOS 96 11/14/2016 1021   AST 17 11/14/2016 1021   ALT 13 11/14/2016 1021   BILITOT 1.25 (H) 11/14/2016 1021       Lab Results  Component Value Date   WBC 11.2 (H) 11/14/2016   HGB 15.4 11/14/2016   HCT 45.7 11/14/2016   MCV 89.1 11/14/2016   PLT 262 11/14/2016   NEUTROABS 8.9 (H) 11/14/2016    ASSESSMENT & PLAN:  Breast cancer of upper-outer quadrant of left female breast Left breast invasive ductal carcinoma grade 3 ER 0% PR 0% HER-2 negative Ki-67 75%, 2.9 cm mass with microcalcifications plus abnormal lymph nodes biopsy-proven to be breast cancer T2 N1 M0 stage IIB clinical stage S/P Neoadjuvant Dose dense AC folll by Abraxane X 12  left lumpectomy 08/23/2015 : Invasive ductal carcinoma grade 3, 2.8 cm, associated extensive  DCIS with comedonecrosis, margins negative,1/2 sentinel nodes positive, ER 0%, PR 0% T2 N1 M0 stage IIB Completed radiation therapy 11/16/2015   I offered the patient adjuvant Xeloda  But she refused.  Hypercalcemia: Ultrasound of the neck had revealed a parathyroid adenoma. I instructed the patient to discuss the ultrasound and calcium levels with her primary care physician. She may need additional imaging and or surgery evaluation given her current degree of hypercalcemia. Abdominal pain and spasms with nausea and vomiting  Back pain: After much discussion we decided to do a bone scan. I will call her with the result of the scan. Fatigue related to prior chemotherapy: Still taking time to improve.  Breast Cancer Surveillance: 1. Breast exam 11/14/2016: Normal 2. Mammogram 04/24/2016 at [facility] showed a 7 mm area of grouped calcifications in the left breast suspicious for malignancy.  Biopsy of this area on 04/28/2016 did not show any malignancy.   Return to clinic in 1 yr   I spent 25 minutes talking to the patient of which more than half was spent in counseling and coordination of care.  Orders Placed This Encounter  Procedures  . NM Bone Scan Whole Body    Standing Status:   Future    Standing Expiration Date:   11/14/2017    Order Specific Question:   Reason for Exam (SYMPTOM  OR DIAGNOSIS REQUIRED)    Answer:   Back pain with breast cancer    Order Specific Question:   Preferred imaging location?    Answer:   [facility]    Order Specific Question:   If indicated for the ordered procedure, I authorize the administration of a radiopharmaceutical per Radiology protocol    Answer:   Yes   The patient has a good understanding of the overall plan. she agrees with it. she will call with any problems that may develop before the next visit here.   Rulon Eisenmenger, MD 11/14/16

## 2016-11-24 ENCOUNTER — Encounter (HOSPITAL_COMMUNITY)
Admission: RE | Admit: 2016-11-24 | Discharge: 2016-11-24 | Disposition: A | Discharge status: Home / Self Care | Payer: Medicare Other | Source: Ambulatory Visit | Attending: Hematology and Oncology | Admitting: Hematology and Oncology

## 2016-11-24 DIAGNOSIS — Z171 Estrogen receptor negative status [ER-]: Secondary | ICD-10-CM | POA: Diagnosis not present

## 2016-11-24 DIAGNOSIS — C50912 Malignant neoplasm of unspecified site of left female breast: Secondary | ICD-10-CM | POA: Diagnosis not present

## 2016-11-24 DIAGNOSIS — C50412 Malignant neoplasm of upper-outer quadrant of left female breast: Secondary | ICD-10-CM | POA: Diagnosis not present

## 2016-11-24 MED ORDER — TECHNETIUM TC 99M MEDRONATE IV KIT
21.8000 | PACK | Freq: Once | INTRAVENOUS | Status: AC | PRN
  Administered 2016-11-24: 21.8 via INTRAVENOUS

## 2016-11-27 DIAGNOSIS — H2513 Age-related nuclear cataract, bilateral: Secondary | ICD-10-CM | POA: Diagnosis not present

## 2016-12-25 DIAGNOSIS — I429 Cardiomyopathy, unspecified: Secondary | ICD-10-CM | POA: Diagnosis not present

## 2016-12-25 DIAGNOSIS — N2581 Secondary hyperparathyroidism of renal origin: Secondary | ICD-10-CM | POA: Diagnosis not present

## 2016-12-25 DIAGNOSIS — I313 Pericardial effusion (noninflammatory): Secondary | ICD-10-CM | POA: Diagnosis not present

## 2016-12-25 DIAGNOSIS — N183 Chronic kidney disease, stage 3 (moderate): Secondary | ICD-10-CM | POA: Diagnosis not present

## 2016-12-25 DIAGNOSIS — E210 Primary hyperparathyroidism: Secondary | ICD-10-CM | POA: Diagnosis not present

## 2016-12-25 DIAGNOSIS — I10 Essential (primary) hypertension: Secondary | ICD-10-CM | POA: Diagnosis not present

## 2016-12-25 DIAGNOSIS — C50919 Malignant neoplasm of unspecified site of unspecified female breast: Secondary | ICD-10-CM | POA: Diagnosis not present

## 2017-01-12 ENCOUNTER — Encounter: Payer: Self-pay | Admitting: Cardiology

## 2017-01-12 ENCOUNTER — Ambulatory Visit (INDEPENDENT_AMBULATORY_CARE_PROVIDER_SITE_OTHER): Payer: Medicare Other | Admitting: Cardiology

## 2017-01-12 VITALS — BP 122/78 | HR 64 | Ht 60.0 in | Wt 154.2 lb

## 2017-01-12 DIAGNOSIS — I313 Pericardial effusion (noninflammatory): Secondary | ICD-10-CM

## 2017-01-12 DIAGNOSIS — I420 Dilated cardiomyopathy: Secondary | ICD-10-CM | POA: Diagnosis not present

## 2017-01-12 DIAGNOSIS — I10 Essential (primary) hypertension: Secondary | ICD-10-CM | POA: Diagnosis not present

## 2017-01-12 DIAGNOSIS — I3139 Other pericardial effusion (noninflammatory): Secondary | ICD-10-CM

## 2017-01-12 DIAGNOSIS — I5022 Chronic systolic (congestive) heart failure: Secondary | ICD-10-CM | POA: Diagnosis not present

## 2017-01-12 MED ORDER — FUROSEMIDE 20 MG PO TABS: 20.0000 mg | ORAL_TABLET | Freq: Every day | ORAL | 6 refills | Status: DC

## 2017-01-12 NOTE — Patient Instructions (Signed)

## 2017-01-12 NOTE — Progress Notes (Signed)
Cardiology Office Note    Date:  01/12/2017   ID:  Carla Bennett, DOB [id number], MRN [id number]  PCP:  Thressa Sheller, MD  Cardiologist:   Candee Furbish, MD     History of Present Illness:  Carla Bennett is a 82 y.o. female here for follow-up of systolic heart failure, cardiomyopathy, previous chemotherapy for breast cancer, pericardial effusion.  She had an echo performed on 03/13/15 which at that time showed no evidence of pericardial effusion. Mild mitral and aortic regurgitation noted. Normal ejection fraction. This was pre-chemotherapy to breast cancer.  Moderate-sized pericardial effusion was personally viewed on CT scan from 02/07/16. Aortic atherosclerosis and mild coronary artery atherosclerosis was also noted. Pleural effusions were also noted.  In general, she did feel weakness and fatigue surrounding her chemotherapy/radiation however in March for instance she was feeling much better from a dyspnea standpoint. Really did not have any significant issues with that she states. Her shortness of breath when going up stairs is much improved, fairly relieved She denies any chest pain, syncope, bleeding, fevers, orthopnea, and explained weight loss, night sweats  On her echocardiogram originally in April, her ejection fraction was markedly reduced at 20-25%. She had been treated with Adriamycin. This also showed moderate pericardial effusion.  A subsequent follow-up echo 1 month later showed ejection fraction of 35%. Improved. Effusion was trace.     Past Medical History:  Diagnosis Date  . Allergy   . Aortic regurgitation   . Arthritis    hands, neck  . Basal cell carcinoma   . Benign neoplasm of colon   . Breast cancer of upper-outer quadrant of left female breast ([place]) 02/23/2015   4'16-left breast cancer-surgery, chemo, radiation-[name] follows every 3 months  . Cataract    beginning stages-monitoring by MD  . Cholelithiasis    resolved with surgery  . Chronic  systolic CHF (congestive heart failure) ([place]) 02/26/2016   A.  Echo 02/21/16:  Mild LVH, EF 20-25%, diff HK, Gr 3 DD, mod AI, mod to severe MR, mod LAE, mod redcued RVSF, mild RAE, mod TR, PASP 53 mmHg, small pericardial effusion, mod L pleural effusion  //  B. Echo 6/17:  Mild LVH EF 30-35%, diffuse HK, grade 1 diastolic dysfunction, trivial AI, MAC, mild LAE, normal RVSF, trivial pericardial effusion    . Cystocele   . Diverticulitis   . Diverticulosis of colon (without mention of hemorrhage)   . Gout    resolved -  . Hearing loss    some hearing loss in right ear- no hearing aids  . Heart murmur   . Hematuria    negative urology work up  . History of blood transfusion 1971   x 1 with D&C -   . Hx of abnormal cervical Pap smear    LEEP-CIN I, negative margins and ECC  . Hypercalcemia   . Hypertension   . Mitral regurgitation   . 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  . OA (osteoarthritis) of hip    right  . Other issue of medical certificates    uterine myomata  . Parathyroid abnormality ([facility])    being followed Dr. Barbette Hair  . Radiation 10/01/15-11/16/15   left breast axillary and supraclavicular reigion 45 gray, left breast 50.4 gray, lumpectomy cavity boost 10 gray  . Renal disease 3/16   Stage 3 kidney disease-seeing Dr Buddy Duty and Dr Marval Regal  . Uterine prolaps    resolved after surgery  .  Vitamin D deficiency     Past Surgical History:  Procedure Laterality Date  . BREAST LUMPECTOMY Left 08/2015  . CHOLECYSTECTOMY    . COLONOSCOPY  01/18/2013   [name]  . COLONOSCOPY N/A 06/26/2016   Procedure: COLONOSCOPY;  Surgeon: Irene Shipper, MD;  Location: [facility];  Service: Endoscopy;  Laterality: N/A;  . CYSTOCELE REPAIR  08/2010   Rectocele, vault prolapse  . DILATION AND CURETTAGE OF UTERUS     x2  . PORT-A-CATH REMOVAL Right 08/23/2015   Procedure: REMOVAL PORT-A-CATH;  Surgeon: Erroll Luna, MD;  Location: [facility];   Service: General;  Laterality: Right;  . PORTACATH PLACEMENT Right 03/20/2015   Procedure: INSERTION PORT-A-CATH;  Surgeon: Erroll Luna, MD;  Location: [place];  Service: General;  Laterality: Right;  . RADIOACTIVE SEED GUIDED MASTECTOMY WITH AXILLARY SENTINEL LYMPH NODE BIOPSY Left 08/23/2015   Procedure: LEFT BREAST PARTIAL MASTECTOMY WITH SENTINEL LYMPH NODE MAPPING;  Surgeon: Erroll Luna, MD;  Location: [place];  Service: General;  Laterality: Left;  . TONSILLECTOMY AND ADENOIDECTOMY    . TOTAL VAGINAL HYSTERECTOMY     secondary to prolapse, ovaries not removed    Current Medications: Outpatient Medications Prior to Visit  Medication Sig Dispense Refill  . B Complex Vitamins (VITAMIN B COMPLEX PO) Take 1 tablet by mouth daily.     . carvedilol (COREG) 12.5 MG tablet Take 1 tablet (12.5 mg total) by mouth 2 (two) times daily. 180 tablet 3  . hyoscyamine (LEVSIN, ANASPAZ) 0.125 MG tablet Take one every 4-6 hours as needed for abdominal pain 15 tablet 0  . lisinopril (PRINIVIL,ZESTRIL) 40 MG tablet Take 40 mg by mouth daily.     Marland Kitchen loratadine (CLARITIN) 10 MG tablet Take 10 mg by mouth daily.    Marland Kitchen spironolactone (ALDACTONE) 25 MG tablet Take 0.5 tablets (12.5 mg total) by mouth daily. 30 tablet 11  . furosemide (LASIX) 20 MG tablet Take 1 tablet (20 mg total) by mouth daily. 30 tablet 6   No facility-administered medications prior to visit.      Allergies:   Patient has no known allergies.   Social History   Social History  . Marital status: Widowed    Spouse name: N/A  . Number of children: 4  . Years of education: N/A   Occupational History  . retired    Social History Main Topics  . Smoking status: Never Smoker  . Smokeless tobacco: Never Used  . Alcohol use Yes     Comment: occasionally  wine/beer  . Drug use: No  . Sexual activity: No     Comment: Vaginal hysterectomy   Other Topics Concern  . None   Social History Narrative  . None      Family History:  The patient's family history includes Alzheimer's disease in her mother; Breast cancer (age of onset: 17) in her sister; Cancer in her maternal uncle; Cancer (age of onset: 50) in her cousin and maternal uncle; Lung cancer in her mother; Lung cancer (age of onset: 62) in her father; Multiple sclerosis in her sister; Parkinson's disease in her mother; Prostate cancer (age of onset: 25) in her maternal uncle.   ROS:   Please see the history of present illness.    ROS All other systems reviewed and are negative.   PHYSICAL EXAM:   VS:  BP 122/78   Pulse 64   Ht 5' (1.524 m)   Wt 154 lb 3.2 oz (69.9 kg)  LMP 10/20/1996   BMI 30.12 kg/m    GEN: Well nourished, well developed, in no acute distress  HEENT: normal  Neck: no significant JVD, carotid bruits, or masses Cardiac: RRR; no significant murmurs, rubs, or gallops,no edema  Respiratory:  clear to auscultation bilaterally, normal work of breathing GI: soft, nontender, nondistended, + BS MS: no deformity or atrophy  Skin: warm and dry, no rash Neuro:  Alert and Oriented x 3, Strength and sensation are intact Psych: euthymic mood, full affect  Wt Readings from Last 3 Encounters:  01/12/17 154 lb 3.2 oz (69.9 kg)  11/14/16 149 lb 8 oz (67.8 kg)  07/16/16 140 lb 4 oz (63.6 kg)      Studies/Labs Reviewed:   EKG:   EKG from today 3/18 shows sinus rhythm 64 with no other abnormalitis..  The ekg from 02/07/16 shows sinus rhythm, lower voltage, poor R-wave progression, no other significant abnormality.  Recent Labs: 11/14/2016: ALT 13; BUN 20.4; Creatinine 1.3; HGB 15.4; Platelets 262; Potassium 4.8; Sodium 139   Lipid Panel No results found for: CHOL, TRIG, HDL, CHOLHDL, VLDL, LDLCALC, LDLDIRECT  Additional studies/ records that were reviewed today include:  CT scan prior old notes, lab work, prior echocardiogram  ECHO 03/26/16 - Left ventricle: The cavity size was normal. Wall thickness was   increased in a  pattern of mild LVH. Systolic function was   moderately to severely reduced. The estimated ejection fraction   was in the range of 30% to 35%. Diffuse hypokinesis. Doppler   parameters are consistent with abnormal left ventricular   relaxation (grade 1 diastolic dysfunction). - Aortic valve: There was no stenosis. There was trivial   regurgitation. - Mitral valve: Mildly calcified annulus. Normal thickness leaflets   . - Left atrium: The atrium was mildly dilated. - Right ventricle: The cavity size was normal. Systolic function   was normal. - Pulmonary arteries: No complete TR Doppler jet so unable to   estimate PA systolic pressure. - Inferior vena cava: The vessel was dilated. The respirophasic   diameter changes were blunted (< 50%), consistent with elevated   central venous pressure. - Pericardium, extracardiac: A trivial pericardial effusion was   identified.  Impressions:  - Normal LV size with mild LV hypertrophy. EF 30-35% with diffuse   hypokinesis. Normal RV size and systolic function. No significant   valvular abnormalities.    ASSESSMENT:    1. Chronic systolic heart failure ([place])   2. Chronic systolic CHF (congestive heart failure) ([place])   3. Pericardial effusion   4. Dilated cardiomyopathy ([place])   5. Essential hypertension      PLAN:  In order of problems listed above:  Pericardial effusion  -  Trivial , much improved.   - Seen on CT scan 02/07/16 moderate in size.  - I'm aware of her creatinine of 1.3. She is following with Dr. Marval Regal.   Aortic atherosclerosis, mild coronary atherosclerosis seen on CT  - Secondary prevention.  Chronic systolic heart failure  - Doing quite well, NYHA class I, no shortness of breath  - No angina  - Likely nonischemic cardiomyopathy, likely from chemotherapy or perhaps viral mediated.  - rechecking echocardiogram EF.  - I personally reviewed CT scan of chest, no obvious coronary calcium detected. EF prior to  breast cancer was normal. This minimizes the likelihood of ischemic cardiomyopathy.   -would like to recheck EF with long standing medical tx.   - For now continue  Bb and ACE-I  Medication Adjustments/Labs and Tests Ordered: Current medicines are reviewed at length with the patient today.  Concerns regarding medicines are outlined above.  Medication changes, Labs and Tests ordered today are listed in the Patient Instructions below. Patient Instructions  Medication Instructions:  The current medical regimen is effective;  continue present plan and medications.  Testing/Procedures: Your physician has requested that you have an echocardiogram. Echocardiography is a painless test that uses sound waves to create images of your heart. It provides your doctor with information about the size and shape of your heart and how well your heart's chambers and valves are working. This procedure takes approximately one hour. There are no restrictions for this procedure.  Follow-Up: Follow up in 1 year with Dr. Marlou Porch.  You will receive a letter in the mail 2 months before you are due.  Please call us when you receive this letter to schedule your follow up appointment.  If you need a refill on your cardiac medications before your next appointment, please call your pharmacy.  Thank you for choosing [facility]!!        Signed, Candee Furbish, MD  01/12/2017 1:16 PM    [address], [place], [place]  [id number] Phone: [phone]; Fax: [phone]

## 2017-01-21 ENCOUNTER — Ambulatory Visit (HOSPITAL_BASED_OUTPATIENT_CLINIC_OR_DEPARTMENT_OTHER)
Admission: RE | Admit: 2017-01-21 | Discharge: 2017-01-21 | Disposition: A | Discharge status: Home / Self Care | Payer: Medicare Other | Source: Ambulatory Visit | Attending: Cardiology | Admitting: Cardiology

## 2017-01-21 DIAGNOSIS — I5022 Chronic systolic (congestive) heart failure: Secondary | ICD-10-CM

## 2017-01-21 DIAGNOSIS — I351 Nonrheumatic aortic (valve) insufficiency: Secondary | ICD-10-CM | POA: Insufficient documentation

## 2017-01-21 DIAGNOSIS — I429 Cardiomyopathy, unspecified: Secondary | ICD-10-CM | POA: Insufficient documentation

## 2017-01-21 LAB — ECHOCARDIOGRAM COMPLETE
AVPHT: 613 ms
E decel time: 243 msec
E/e' ratio: 15.01
FS: 26 % — AB (ref 28–44)
IVS/LV PW RATIO, ED: 1.13
LA diam end sys: 42 mm
LA diam index: 2.51 cm/m2
LA vol A4C: 42.8 ml
LA vol index: 23.3 mL/m2
LA vol: 38.9 mL
LASIZE: 42 mm
LDCA: 2.54 cm2
LV PW d: 9.35 mm — AB (ref 0.6–1.1)
LV TDI E'LATERAL: 3.37
LV TDI E'MEDIAL: 3.59
LVEEAVG: 15.01
LVEEMED: 15.01
LVELAT: 3.37 cm/s
LVOT SV: 45 mL
LVOT VTI: 17.9 cm
LVOTD: 18 mm
LVOTPV: 72.5 cm/s
MV Dec: 243
MV pk A vel: 92.9 m/s
MVPKEVEL: 50.6 m/s
RV LATERAL S' VELOCITY: 13.6 cm/s
TAPSE: 16.3 mm

## 2017-01-21 NOTE — Progress Notes (Signed)
  Echocardiogram 2D Echocardiogram has been performed.  Carla Bennett 01/21/2017, 12:08 PM

## 2017-02-09 DIAGNOSIS — E559 Vitamin D deficiency, unspecified: Secondary | ICD-10-CM | POA: Diagnosis not present

## 2017-02-09 DIAGNOSIS — N183 Chronic kidney disease, stage 3 (moderate): Secondary | ICD-10-CM | POA: Diagnosis not present

## 2017-02-09 DIAGNOSIS — E210 Primary hyperparathyroidism: Secondary | ICD-10-CM | POA: Diagnosis not present

## 2017-02-17 DIAGNOSIS — Z853 Personal history of malignant neoplasm of breast: Secondary | ICD-10-CM | POA: Diagnosis not present

## 2017-02-17 DIAGNOSIS — E210 Primary hyperparathyroidism: Secondary | ICD-10-CM | POA: Diagnosis not present

## 2017-02-17 DIAGNOSIS — N183 Chronic kidney disease, stage 3 (moderate): Secondary | ICD-10-CM | POA: Diagnosis not present

## 2017-02-17 DIAGNOSIS — I5022 Chronic systolic (congestive) heart failure: Secondary | ICD-10-CM | POA: Diagnosis not present

## 2017-02-17 DIAGNOSIS — I129 Hypertensive chronic kidney disease with stage 1 through stage 4 chronic kidney disease, or unspecified chronic kidney disease: Secondary | ICD-10-CM | POA: Diagnosis not present

## 2017-03-19 DIAGNOSIS — N183 Chronic kidney disease, stage 3 (moderate): Secondary | ICD-10-CM | POA: Diagnosis not present

## 2017-03-19 DIAGNOSIS — Z6829 Body mass index (BMI) 29.0-29.9, adult: Secondary | ICD-10-CM | POA: Diagnosis not present

## 2017-03-19 DIAGNOSIS — C50919 Malignant neoplasm of unspecified site of unspecified female breast: Secondary | ICD-10-CM | POA: Diagnosis not present

## 2017-03-19 DIAGNOSIS — N2581 Secondary hyperparathyroidism of renal origin: Secondary | ICD-10-CM | POA: Diagnosis not present

## 2017-03-19 DIAGNOSIS — E210 Primary hyperparathyroidism: Secondary | ICD-10-CM | POA: Diagnosis not present

## 2017-03-19 DIAGNOSIS — I10 Essential (primary) hypertension: Secondary | ICD-10-CM | POA: Diagnosis not present

## 2017-03-19 DIAGNOSIS — I429 Cardiomyopathy, unspecified: Secondary | ICD-10-CM | POA: Diagnosis not present

## 2017-03-19 DIAGNOSIS — I313 Pericardial effusion (noninflammatory): Secondary | ICD-10-CM | POA: Diagnosis not present

## 2017-04-02 ENCOUNTER — Emergency Department (HOSPITAL_BASED_OUTPATIENT_CLINIC_OR_DEPARTMENT_OTHER)
Admission: EM | Admit: 2017-04-02 | Discharge: 2017-04-02 | Disposition: A | Discharge status: Home / Self Care | Payer: Medicare Other | Attending: Physician Assistant | Admitting: Physician Assistant

## 2017-04-02 ENCOUNTER — Encounter (HOSPITAL_BASED_OUTPATIENT_CLINIC_OR_DEPARTMENT_OTHER): Payer: Self-pay | Admitting: *Deleted

## 2017-04-02 ENCOUNTER — Other Ambulatory Visit: Payer: Self-pay | Admitting: Physician Assistant

## 2017-04-02 DIAGNOSIS — A09 Infectious gastroenteritis and colitis, unspecified: Secondary | ICD-10-CM | POA: Diagnosis not present

## 2017-04-02 DIAGNOSIS — I420 Dilated cardiomyopathy: Secondary | ICD-10-CM

## 2017-04-02 DIAGNOSIS — Z79899 Other long term (current) drug therapy: Secondary | ICD-10-CM | POA: Insufficient documentation

## 2017-04-02 DIAGNOSIS — R197 Diarrhea, unspecified: Secondary | ICD-10-CM | POA: Insufficient documentation

## 2017-04-02 DIAGNOSIS — I10 Essential (primary) hypertension: Secondary | ICD-10-CM | POA: Insufficient documentation

## 2017-04-02 DIAGNOSIS — K2900 Acute gastritis without bleeding: Secondary | ICD-10-CM | POA: Diagnosis not present

## 2017-04-02 DIAGNOSIS — E860 Dehydration: Secondary | ICD-10-CM | POA: Diagnosis not present

## 2017-04-02 LAB — URINALYSIS, ROUTINE W REFLEX MICROSCOPIC
BILIRUBIN URINE: NEGATIVE
Glucose, UA: NEGATIVE mg/dL
HGB URINE DIPSTICK: NEGATIVE
KETONES UR: NEGATIVE mg/dL
Nitrite: NEGATIVE
PROTEIN: NEGATIVE mg/dL
Specific Gravity, Urine: 1.009 (ref 1.005–1.030)
pH: 6 (ref 5.0–8.0)

## 2017-04-02 LAB — COMPREHENSIVE METABOLIC PANEL
ALT: 18 U/L (ref 14–54)
AST: 25 U/L (ref 15–41)
Albumin: 4.3 g/dL (ref 3.5–5.0)
Alkaline Phosphatase: 72 U/L (ref 38–126)
Anion gap: 10 (ref 5–15)
BILIRUBIN TOTAL: 0.8 mg/dL (ref 0.3–1.2)
BUN: 17 mg/dL (ref 6–20)
CO2: 22 mmol/L (ref 22–32)
CREATININE: 1.4 mg/dL — AB (ref 0.44–1.00)
Calcium: 10.5 mg/dL — ABNORMAL HIGH (ref 8.9–10.3)
Chloride: 104 mmol/L (ref 101–111)
GFR calc Af Amer: 40 mL/min — ABNORMAL LOW (ref >60)
GFR calc non Af Amer: 34 mL/min — ABNORMAL LOW (ref >60)
Glucose, Bld: 100 mg/dL — ABNORMAL HIGH (ref 65–99)
POTASSIUM: 4.1 mmol/L (ref 3.5–5.1)
Sodium: 136 mmol/L (ref 135–145)
TOTAL PROTEIN: 7.1 g/dL (ref 6.5–8.1)

## 2017-04-02 LAB — CBC WITH DIFFERENTIAL/PLATELET
Basophils Absolute: 0 10*3/uL (ref 0.0–0.1)
Basophils Relative: 0 %
EOS ABS: 0.2 10*3/uL (ref 0.0–0.7)
EOS PCT: 3 %
HCT: 47.3 % — ABNORMAL HIGH (ref 36.0–46.0)
Hemoglobin: 16 g/dL — ABNORMAL HIGH (ref 12.0–15.0)
Lymphocytes Relative: 14 %
Lymphs Abs: 1.3 10*3/uL (ref 0.7–4.0)
MCH: 29.8 pg (ref 26.0–34.0)
MCHC: 33.8 g/dL (ref 30.0–36.0)
MCV: 88.1 fL (ref 78.0–100.0)
Monocytes Absolute: 0.8 10*3/uL (ref 0.1–1.0)
Monocytes Relative: 9 %
Neutro Abs: 6.8 10*3/uL (ref 1.7–7.7)
Neutrophils Relative %: 74 %
PLATELETS: 277 10*3/uL (ref 150–400)
RBC: 5.37 MIL/uL — AB (ref 3.87–5.11)
RDW: 13.5 % (ref 11.5–15.5)
WBC: 9.2 10*3/uL (ref 4.0–10.5)

## 2017-04-02 LAB — C DIFFICILE QUICK SCREEN W PCR REFLEX
C DIFFICLE (CDIFF) ANTIGEN: NEGATIVE
C Diff interpretation: NOT DETECTED
C Diff toxin: NEGATIVE

## 2017-04-02 LAB — URINALYSIS, MICROSCOPIC (REFLEX)

## 2017-04-02 LAB — LIPASE, BLOOD: Lipase: 32 U/L (ref 11–51)

## 2017-04-02 LAB — I-STAT CG4 LACTIC ACID, ED: LACTIC ACID, VENOUS: 1.2 mmol/L (ref 0.5–1.9)

## 2017-04-02 MED ORDER — SODIUM CHLORIDE 0.9 % IV BOLUS (SEPSIS)
500.0000 mL | Freq: Once | INTRAVENOUS | Status: AC
  Administered 2017-04-02: 500 mL via INTRAVENOUS

## 2017-04-02 MED ORDER — SODIUM CHLORIDE 0.9 % IV BOLUS (SEPSIS)
1000.0000 mL | Freq: Once | INTRAVENOUS | Status: AC
  Administered 2017-04-02: 1000 mL via INTRAVENOUS

## 2017-04-02 NOTE — ED Triage Notes (Signed)
Diarrhea x 4 days. No vomiting. She went to [facility] and was told she may be dehydrated.

## 2017-04-02 NOTE — ED Notes (Signed)
Pt started drinking water. Her our own water bottle

## 2017-04-02 NOTE — ED Provider Notes (Addendum)
[facility] DEPT [facility] Provider Note   CSN: [id number] Arrival date & time: 04/02/17  1138     History   Chief Complaint Chief Complaint  Patient presents with  . Diarrhea    HPI Carla Bennett is a 82 y.o. female.  HPI   Patient is an 82 year old female who is very well-appearing presenting today with diarrhea. Patient had diarrhea for the last 2 days. Patient had no vomiting. No abdominal pain. Reports 4-5 episodes of diarrhea today. No blood in the diarrhea and no dark stools. No fevers.  Patient went to urgent care today and was sent here for labs and further evaluation.  Past Medical History:  Diagnosis Date  . Allergy   . Aortic regurgitation   . Arthritis    hands, neck  . Basal cell carcinoma   . Benign neoplasm of colon   . Breast cancer of upper-outer quadrant of left female breast ([place]) 02/23/2015   4'16-left breast cancer-surgery, chemo, radiation-[name] follows every 3 months  . Cataract    beginning stages-monitoring by MD  . Cholelithiasis    resolved with surgery  . Chronic systolic CHF (congestive heart failure) ([place]) 02/26/2016   A.  Echo 02/21/16:  Mild LVH, EF 20-25%, diff HK, Gr 3 DD, mod AI, mod to severe MR, mod LAE, mod redcued RVSF, mild RAE, mod TR, PASP 53 mmHg, small pericardial effusion, mod L pleural effusion  //  B. Echo 6/17:  Mild LVH EF 30-35%, diffuse HK, grade 1 diastolic dysfunction, trivial AI, MAC, mild LAE, normal RVSF, trivial pericardial effusion    . Cystocele   . Diverticulitis   . Diverticulosis of colon (without mention of hemorrhage)   . Gout    resolved -  . Hearing loss    some hearing loss in right ear- no hearing aids  . Heart murmur   . Hematuria    negative urology work up  . History of blood transfusion 1971   x 1 with D&C -   . Hx of abnormal cervical Pap smear    LEEP-CIN I, negative margins and ECC  . Hypercalcemia   . Hypertension   . Mitral regurgitation   . Neuromuscular disorder ([place])    mild  neuropathy in feet due to chemo.  . OA (osteoarthritis) of hip    right  . Other issue of medical certificates    uterine myomata  . Parathyroid abnormality ([facility])    being followed Dr. Barbette Hair  . Radiation 10/01/15-11/16/15   left breast axillary and supraclavicular reigion 45 gray, left breast 50.4 gray, lumpectomy cavity boost 10 gray  . Renal disease 3/16   Stage 3 kidney disease-seeing Dr Buddy Duty and Dr Marval Regal  . Uterine prolaps    resolved after surgery  . Vitamin D deficiency     Patient Active Problem List   Diagnosis Date Noted  . Diverticulosis of colon without hemorrhage   . AP (abdominal pain)   . Abnormal CT of the abdomen   . Chronic systolic CHF (congestive heart failure) ([place]) 02/26/2016  . Dilated cardiomyopathy ([place]) 02/26/2016  . Pericardial effusion 02/26/2016  . CKD (chronic kidney disease) 02/26/2016  . Essential hypertension 07/26/2015  . Central line complication 75/07/2584  . Sinusitis 06/07/2015  . Hypercalcemia 05/24/2015  . Family history of malignant neoplasm of gastrointestinal tract 03/08/2015  . Family history of malignant neoplasm of breast 03/08/2015  . Breast cancer of upper-outer quadrant of left female breast ([place]) 02/23/2015  . Hematochezia 01/06/2013  .  Abdominal pain, other specified site 01/06/2013    Past Surgical History:  Procedure Laterality Date  . BREAST LUMPECTOMY Left 08/2015  . CHOLECYSTECTOMY    . COLONOSCOPY  01/18/2013   [name]  . COLONOSCOPY N/A 06/26/2016   Procedure: COLONOSCOPY;  Surgeon: Irene Shipper, MD;  Location: [facility];  Service: Endoscopy;  Laterality: N/A;  . CYSTOCELE REPAIR  08/2010   Rectocele, vault prolapse  . DILATION AND CURETTAGE OF UTERUS     x2  . PORT-A-CATH REMOVAL Right 08/23/2015   Procedure: REMOVAL PORT-A-CATH;  Surgeon: Erroll Luna, MD;  Location: [facility];  Service: General;  Laterality: Right;  . PORTACATH PLACEMENT Right 03/20/2015   Procedure: INSERTION  PORT-A-CATH;  Surgeon: Erroll Luna, MD;  Location: [facility];  Service: General;  Laterality: Right;  . RADIOACTIVE SEED GUIDED MASTECTOMY WITH AXILLARY SENTINEL LYMPH NODE BIOPSY Left 08/23/2015   Procedure: LEFT BREAST PARTIAL MASTECTOMY WITH SENTINEL LYMPH NODE MAPPING;  Surgeon: Erroll Luna, MD;  Location: [facility];  Service: General;  Laterality: Left;  . TONSILLECTOMY AND ADENOIDECTOMY    . TOTAL VAGINAL HYSTERECTOMY     secondary to prolapse, ovaries not removed    OB History    Gravida Para Term Preterm AB Living   _0 SAB TAB Ectopic Multiple Live Births   2               Home Medications    Prior to Admission medications   Medication Sig Start Date End Date Taking? Authorizing Provider  alendronate (FOSAMAX) 35 MG tablet Take 35 mg by mouth once a week. 12/26/16   [provider]  B Complex Vitamins (VITAMIN B COMPLEX PO) Take 1 tablet by mouth daily.     [provider]  carvedilol (COREG) 12.5 MG tablet Take 1 tablet (12.5 mg total) by mouth 2 (two) times daily. 02/26/16   Richardson Dopp T, PA-C  furosemide (LASIX) 20 MG tablet Take 1 tablet (20 mg total) by mouth daily. 01/12/17   Jerline Pain, MD  hyoscyamine (LEVSIN, ANASPAZ) 0.125 MG tablet Take one every 4-6 hours as needed for abdominal pain 05/22/16   Levin Erp, PA  lisinopril (PRINIVIL,ZESTRIL) 40 MG tablet Take 40 mg by mouth daily.  09/01/13   [provider]  loratadine (CLARITIN) 10 MG tablet Take 10 mg by mouth daily.    [provider]  spironolactone (ALDACTONE) 25 MG tablet TAKE ONE-HALF TABLET (12.5 MG TOTAL) BY MOUTH DAILY. 04/02/17   Liliane Shi, PA-C    Family History Family History  Problem Relation Age of Onset  . Lung cancer Father 54  . Parkinson's disease Mother   . Alzheimer's disease Mother        ? diag  . Lung cancer Mother   . Breast cancer Sister 31  . Multiple sclerosis Sister   . Prostate cancer Maternal  Uncle 63  . Cancer Maternal Uncle        stomache  . Cancer Maternal Uncle 95       GI cancer - ? stomach  . Cancer Cousin 78       mat female first cousin (son of mat uncle with GI cancer) with ureter cancer  . Colon cancer Neg Hx   . Esophageal cancer Neg Hx   . Rectal cancer Neg Hx   . Stomach cancer Neg Hx   . Colon polyps Neg Hx  Social History Social History  Substance Use Topics  . Smoking status: Never Smoker  . Smokeless tobacco: Never Used  . Alcohol use Yes     Comment: occasionally  wine/beer     Allergies   Patient has no known allergies.   Review of Systems Review of Systems  Constitutional: Positive for fatigue. Negative for fever.  Gastrointestinal: Positive for diarrhea and nausea. Negative for abdominal distention, abdominal pain, anal bleeding, blood in stool, constipation, rectal pain and vomiting.  Neurological: Positive for light-headedness.  All other systems reviewed and are negative.    Physical Exam Updated Vital Signs BP (!) 148/79 (BP Location: Right Arm)   Pulse 77   Temp 97.9 F (36.6 C) (Oral)   Resp 18   Ht _0  (1.549 m)   Wt 69.4 kg (153 lb)   LMP 10/20/1996   SpO2 99%   BMI 28.91 kg/m   Physical Exam  Constitutional: She is oriented to person, place, and time. She appears well-developed and well-nourished.  HENT:  Head: Normocephalic and atraumatic.  Eyes: Right eye exhibits no discharge.  Cardiovascular: Normal rate, regular rhythm and normal heart sounds.   No murmur heard. Pulmonary/Chest: Effort normal and breath sounds normal. She has no wheezes. She has no rales.  Abdominal: Soft. She exhibits no distension. There is no tenderness.  Neurological: She is oriented to person, place, and time.  Skin: Skin is warm and dry. She is not diaphoretic.  Psychiatric: She has a normal mood and affect.  Nursing note and vitals reviewed.    ED Treatments / Results  Labs (all labs ordered are listed, but only abnormal  results are displayed) Labs Reviewed  COMPREHENSIVE METABOLIC PANEL - Abnormal; Notable for the following:       Result Value   Glucose, Bld 100 (*)    Creatinine, Ser 1.40 (*)    Calcium 10.5 (*)    GFR calc non Af Amer 34 (*)    GFR calc Af Amer 40 (*)    All other components within normal limits  URINALYSIS, ROUTINE W REFLEX MICROSCOPIC - Abnormal; Notable for the following:    APPearance CLOUDY (*)    Leukocytes, UA SMALL (*)    All other components within normal limits  CBC WITH DIFFERENTIAL/PLATELET - Abnormal; Notable for the following:    RBC 5.37 (*)    Hemoglobin 16.0 (*)    HCT 47.3 (*)    All other components within normal limits  URINALYSIS, MICROSCOPIC (REFLEX) - Abnormal; Notable for the following:    Bacteria, UA FEW (*)    Squamous Epithelial / LPF 0-5 (*)    All other components within normal limits  GASTROINTESTINAL PANEL BY PCR, STOOL (REPLACES STOOL CULTURE)  C DIFFICILE QUICK SCREEN W PCR REFLEX  LIPASE, BLOOD  I-STAT CG4 LACTIC ACID, ED  I-STAT CG4 LACTIC ACID, ED    EKG  EKG Interpretation None       Radiology No results found.  Procedures Procedures (including critical care time)  Medications Ordered in ED Medications  sodium chloride 0.9 % bolus 1,000 mL (0 mLs Intravenous Stopped 04/02/17 1436)  sodium chloride 0.9 % bolus 500 mL (500 mLs Intravenous New Bag/Given 04/02/17 1420)     Initial Impression / Assessment and Plan / ED Course  I have reviewed the triage vital signs and the nursing notes.  Pertinent labs & imaging results that were available during my care of the patient were reviewed by me and considered in my medical  decision making (see chart for details).    Patient's a very spry and well-appearing 82 year old female presenting with diarrhea for 2 days. Patient's had no vomiting. I suspect some kind of virus or food borne illness. Patient has no tenderness to her abdomen. Even so I'm tempted to CAT scan to this patient,  however patient requseting to avoid  CAT scan if at all possible because she's had so many in the past. I think it is reasonable to get labs and give fluid resuscitation first.   2:54 PM Labs reassuring. Feels better after fluids, stilll taking PO. No recent abx or travel. No packaged fruit intake (salmonella outbreak currently).  Will have her monitor symptoms at home and follow upw with PCP.   Patient had a couple episodes of diarrhea here. Her vital signs remain normal. Patient walking around the department with plenty of energy. We'll have her stool sent for cultures and C. difficile. Patient told that she'll be called with results. Patient's told to follow-up with primary care physician. Patient told to return if her diarrhea continues. She allowed to take Lomiitil at home.  Given that I don't want to give patient unnecessary antibiotics given her age, we will have her follow up about the results of her stool studies rather than empiric antibiotics. Patient is in agreement with plan.     Final Clinical Impressions(s) / ED Diagnoses   Final diagnoses:  None    New Prescriptions New Prescriptions   No medications on file     Macarthur Critchley, MD 04/02/17 1340    [place], Fredia Sorrow, MD 04/02/17 1454

## 2017-04-02 NOTE — Discharge Instructions (Signed)
We are unsure what is causing your diarrhea. We think it may be a virus. However we sent your diarrhea to be checked to see if there is a bacteria that we can give the antibiotics to get better. You will be called with the results of the positive. Otherwise follow-up with her primary care physician for the results.  Please return if you are unable to stay hydrated at home. You may take an antidiarrheal medication over-the-counter if it helps.

## 2017-04-03 LAB — GASTROINTESTINAL PANEL BY PCR, STOOL (REPLACES STOOL CULTURE)
ASTROVIRUS: DETECTED — AB
Adenovirus F40/41: NOT DETECTED
Campylobacter species: NOT DETECTED
Cryptosporidium: NOT DETECTED
Cyclospora cayetanensis: NOT DETECTED
ENTAMOEBA HISTOLYTICA: NOT DETECTED
ENTEROAGGREGATIVE E COLI (EAEC): NOT DETECTED
Enteropathogenic E coli (EPEC): NOT DETECTED
Enterotoxigenic E coli (ETEC): NOT DETECTED
GIARDIA LAMBLIA: NOT DETECTED
NOROVIRUS GI/GII: NOT DETECTED
Plesimonas shigelloides: NOT DETECTED
ROTAVIRUS A: NOT DETECTED
SAPOVIRUS (I, II, IV, AND V): NOT DETECTED
SHIGA LIKE TOXIN PRODUCING E COLI (STEC): NOT DETECTED
Salmonella species: NOT DETECTED
Shigella/Enteroinvasive E coli (EIEC): NOT DETECTED
VIBRIO CHOLERAE: NOT DETECTED
Vibrio species: NOT DETECTED
Yersinia enterocolitica: NOT DETECTED

## 2017-04-07 ENCOUNTER — Other Ambulatory Visit: Payer: Self-pay | Admitting: Physician Assistant

## 2017-04-07 DIAGNOSIS — I420 Dilated cardiomyopathy: Secondary | ICD-10-CM

## 2017-04-07 DIAGNOSIS — I5022 Chronic systolic (congestive) heart failure: Secondary | ICD-10-CM

## 2017-04-09 IMAGING — CR DG CHEST 2V
2 series · 2 of 2 positions shown · non-contrast
Comparison: 03/20/2015

CLINICAL DATA: Shortness of breath and weakness for 2 weeks

EXAM:
CHEST  2 VIEW

[w chest pa]
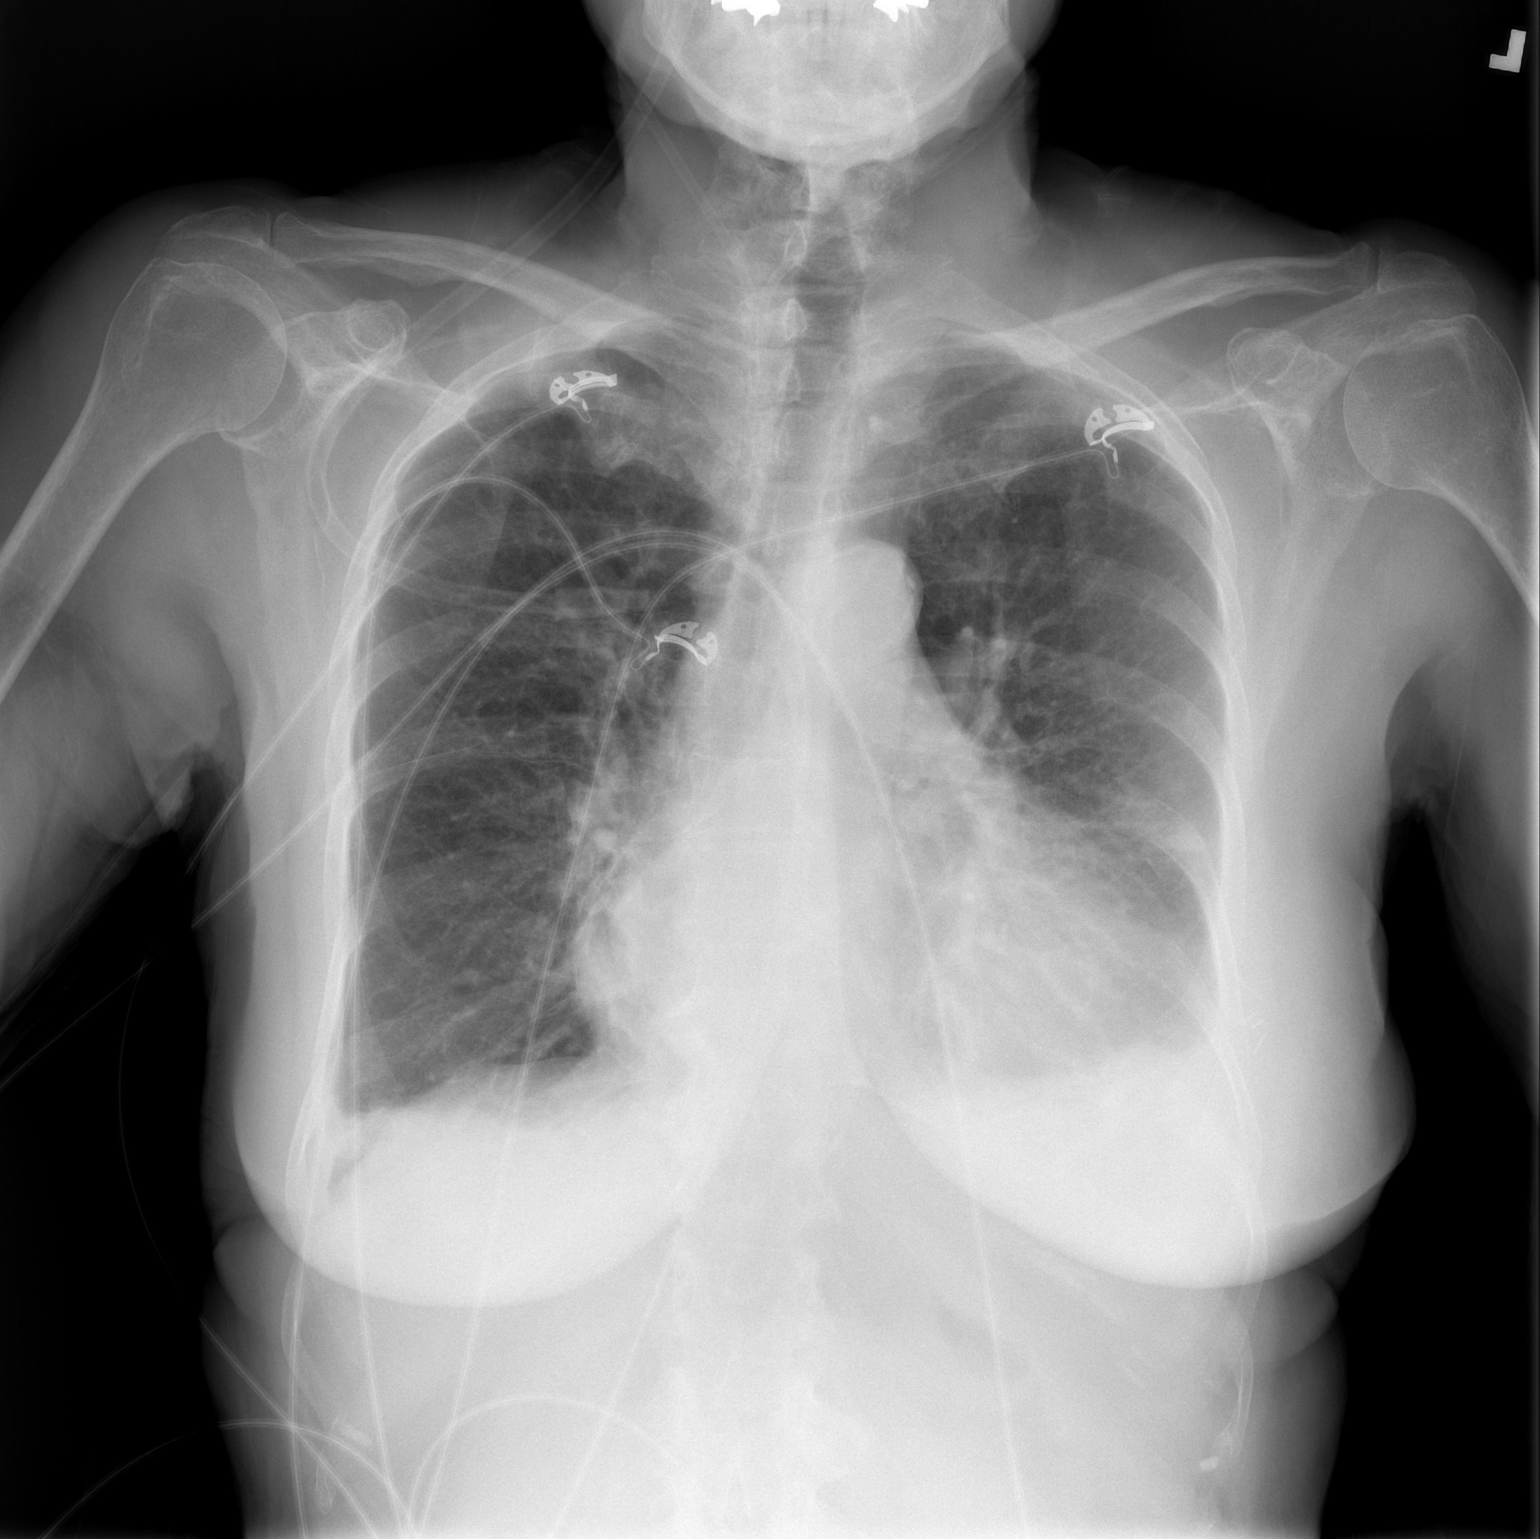

[w chest lat]
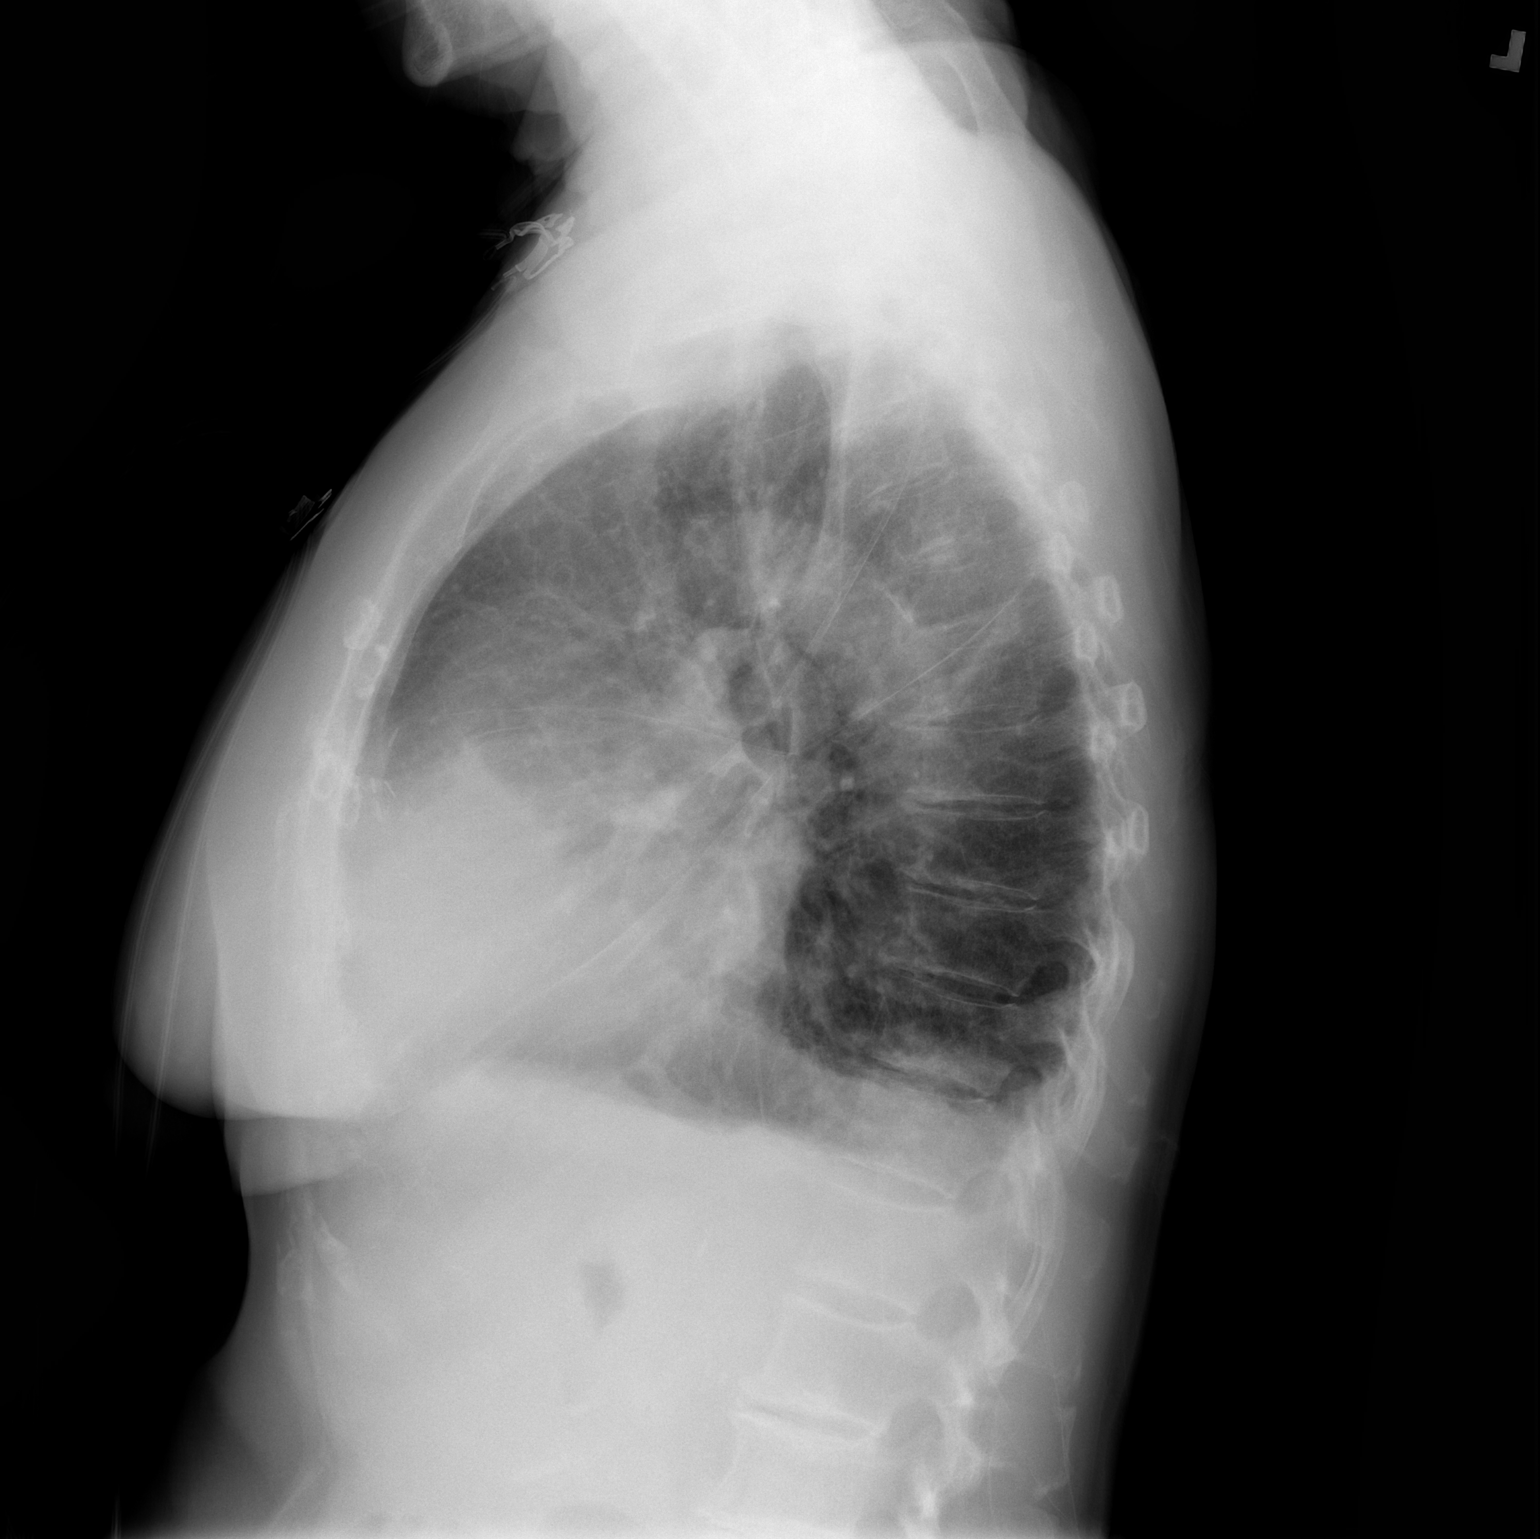

[2 of 2 positions shown; findings below may reference images not displayed]

FINDINGS: Cardiac shadow is mildly enlarged. Bibasilar atelectasis/ infiltrate
is seen. The previously noted right chest wall port has been removed
in the interval. No acute bony abnormality is noted.
IMPRESSION: Bibasilar atelectasis/ infiltrate.

## 2017-04-15 DIAGNOSIS — E210 Primary hyperparathyroidism: Secondary | ICD-10-CM | POA: Diagnosis not present

## 2017-04-15 DIAGNOSIS — E042 Nontoxic multinodular goiter: Secondary | ICD-10-CM | POA: Diagnosis not present

## 2017-04-30 DIAGNOSIS — Z853 Personal history of malignant neoplasm of breast: Secondary | ICD-10-CM | POA: Diagnosis not present

## 2017-04-30 DIAGNOSIS — Z780 Asymptomatic menopausal state: Secondary | ICD-10-CM | POA: Diagnosis not present

## 2017-04-30 DIAGNOSIS — R928 Other abnormal and inconclusive findings on diagnostic imaging of breast: Secondary | ICD-10-CM | POA: Diagnosis not present

## 2017-05-28 ENCOUNTER — Telehealth: Payer: Self-pay | Admitting: Cardiology

## 2017-05-28 DIAGNOSIS — I420 Dilated cardiomyopathy: Secondary | ICD-10-CM

## 2017-05-28 DIAGNOSIS — I5022 Chronic systolic (congestive) heart failure: Secondary | ICD-10-CM

## 2017-05-28 MED ORDER — CARVEDILOL 12.5 MG PO TABS: 18.7500 mg | ORAL_TABLET | Freq: Two times a day (BID) | ORAL | 1 refills | Status: DC

## 2017-05-28 NOTE — Telephone Encounter (Signed)
F/u message  Pt call requesting to speak with RN. Pt states she called the office at 730am this morning and left a message.;  She does not know who the person she spoke with. Pt states she would rather disclose further information with RN. Please call back to discuss

## 2017-05-28 NOTE — Telephone Encounter (Addendum)
Pt states that she called early this AM because her BP last night was 193/110 pt took an extra carvedilol 12.5 mg about 10 PM last night.  . Pt C/O of her head feels heavy when her BP is running  High. Pt  went to sleep when her BP was 155/88. This  AM after she got up at 7:30 AM her BP was 174/88. Pt took her am medication carvedilol 12.5 mg wish she takes twice daily and Aldactone 12.5  mg  Daily at 9:20 AM. At 11:30 AM her BP is 144/77. Pt wants to know if she has to take an extra BP medication. Pt is aware that will let DOD know about his BP issues. Dr. Angelena Form DOD aware. He recommends to let Dr Marlou Porch know to see what he recommends, if to increase the Carvedilol.   Spoke with Eino Farber [facility], she  Agree to make recommendation for pt.

## 2017-05-28 NOTE — Telephone Encounter (Signed)
Spoke with patient who states that pressure is up to 157/80 at 115pm. She rechecked while on phone with me and 179/94 HR 66. No symptoms - no chest pain, SOB, blurred vision. She denies head ache today but states she has had headache last few days. She reports her pressure has been high for last 4 days. HR usually running 60s-70s.   Will increase carvedilol to 18.75mg  BID and scheduled in HTN clinic in 2 weeks to check pressure. Advised to call if additional issues or go to be evaluated if develop symptoms. She states understanding and appreciation.

## 2017-05-29 ENCOUNTER — Telehealth: Payer: Self-pay | Admitting: Cardiology

## 2017-05-29 NOTE — Telephone Encounter (Signed)
New message      Pt c/o BP issue: STAT if pt c/o blurred vision, one-sided weakness or slurred speech  1. What are your last 5 BP readings? 104/50  2. Are you having any other symptoms (ex. Dizziness, headache, blurred vision, passed out)?  Dizziness  3. What is your BP issue? Pt states that her bp is too low.  Medications were recently adjusted.  Pt want to be seen today.  Please call

## 2017-05-29 NOTE — Telephone Encounter (Signed)
Spoke with patient and she reports that she is feeling better now her pressure is 129/70. She states she was not dehydrated and has no reason to be low. She reports her pressure fluctuated on 12.5mg  BID dose as well. But then reports her lowest pressure was 150s. She states she felt so poorly this morning due to dizziness. However when discussed decreasing dose of carvedilol back to 12.5mg  BID she states she does not wish to do so as her pressure has been high for several days/weeks on lower dose of carvedilol. Over the last few weeks. She states she wishes to continue with 18.75mg  BID and move her follow up in HTN clinic sooner. This has been done. Advised to call on call or got to ER if pressure drops over weekend. She states appreciation and understanding.

## 2017-05-29 NOTE — Telephone Encounter (Signed)
Patient calling and states that her BP is too low this morning. Patient called in yesterday with complaints of HTN. Patient was instructed by Claiborne Billings, [facility] to increase her carvedilol from 12.5 mg BID to 18.75 mg BID and return for a BP check on 8/30. Patient states that she started taking the higher dose of carvedilol last night. She states when she got up this morning her BP was 156/78 HR 72. She states that she took the carvedilol 18.75 mg and she started feeling dizzy while she was washing up. She rechecked her BP and it was 104/50 HR 60. Advised that the patient stay hydrated and change positions slowly. The patient states that she did not feel dizzy with position changes that she felt dizzy when her BP was too low. Patient denies having any symptoms at this time. Patient is concerned with the fluctuations in her BP. Made patient aware that the information would be forwarded to [facility], [facility] for further recommendation.

## 2017-06-03 ENCOUNTER — Ambulatory Visit: Payer: Medicare Other

## 2017-06-05 ENCOUNTER — Other Ambulatory Visit: Payer: Self-pay | Admitting: Physician Assistant

## 2017-06-18 ENCOUNTER — Ambulatory Visit: Payer: Medicare Other

## 2017-06-26 DIAGNOSIS — E210 Primary hyperparathyroidism: Secondary | ICD-10-CM | POA: Diagnosis not present

## 2017-06-26 DIAGNOSIS — N183 Chronic kidney disease, stage 3 (moderate): Secondary | ICD-10-CM | POA: Diagnosis not present

## 2017-06-26 DIAGNOSIS — I10 Essential (primary) hypertension: Secondary | ICD-10-CM | POA: Diagnosis not present

## 2017-06-26 DIAGNOSIS — N2581 Secondary hyperparathyroidism of renal origin: Secondary | ICD-10-CM | POA: Diagnosis not present

## 2017-06-26 DIAGNOSIS — Z6829 Body mass index (BMI) 29.0-29.9, adult: Secondary | ICD-10-CM | POA: Diagnosis not present

## 2017-06-26 DIAGNOSIS — I313 Pericardial effusion (noninflammatory): Secondary | ICD-10-CM | POA: Diagnosis not present

## 2017-06-26 DIAGNOSIS — C50919 Malignant neoplasm of unspecified site of unspecified female breast: Secondary | ICD-10-CM | POA: Diagnosis not present

## 2017-06-26 DIAGNOSIS — I429 Cardiomyopathy, unspecified: Secondary | ICD-10-CM | POA: Diagnosis not present

## 2017-08-13 DIAGNOSIS — Z23 Encounter for immunization: Secondary | ICD-10-CM | POA: Diagnosis not present

## 2017-08-26 DIAGNOSIS — I10 Essential (primary) hypertension: Secondary | ICD-10-CM | POA: Diagnosis not present

## 2017-08-26 DIAGNOSIS — E559 Vitamin D deficiency, unspecified: Secondary | ICD-10-CM | POA: Diagnosis not present

## 2017-08-26 DIAGNOSIS — Z0000 Encounter for general adult medical examination without abnormal findings: Secondary | ICD-10-CM | POA: Diagnosis not present

## 2017-08-26 DIAGNOSIS — N390 Urinary tract infection, site not specified: Secondary | ICD-10-CM | POA: Diagnosis not present

## 2017-08-26 DIAGNOSIS — E213 Hyperparathyroidism, unspecified: Secondary | ICD-10-CM | POA: Diagnosis not present

## 2017-08-31 DIAGNOSIS — N390 Urinary tract infection, site not specified: Secondary | ICD-10-CM | POA: Diagnosis not present

## 2017-08-31 DIAGNOSIS — I5022 Chronic systolic (congestive) heart failure: Secondary | ICD-10-CM | POA: Diagnosis not present

## 2017-08-31 DIAGNOSIS — Z01419 Encounter for gynecological examination (general) (routine) without abnormal findings: Secondary | ICD-10-CM | POA: Diagnosis not present

## 2017-08-31 DIAGNOSIS — E210 Primary hyperparathyroidism: Secondary | ICD-10-CM | POA: Diagnosis not present

## 2017-08-31 DIAGNOSIS — N183 Chronic kidney disease, stage 3 (moderate): Secondary | ICD-10-CM | POA: Diagnosis not present

## 2017-08-31 DIAGNOSIS — E559 Vitamin D deficiency, unspecified: Secondary | ICD-10-CM | POA: Diagnosis not present

## 2017-08-31 DIAGNOSIS — I10 Essential (primary) hypertension: Secondary | ICD-10-CM | POA: Diagnosis not present

## 2017-08-31 DIAGNOSIS — R319 Hematuria, unspecified: Secondary | ICD-10-CM | POA: Diagnosis not present

## 2017-09-24 DIAGNOSIS — Z6829 Body mass index (BMI) 29.0-29.9, adult: Secondary | ICD-10-CM | POA: Diagnosis not present

## 2017-09-24 DIAGNOSIS — N2581 Secondary hyperparathyroidism of renal origin: Secondary | ICD-10-CM | POA: Diagnosis not present

## 2017-09-24 DIAGNOSIS — I129 Hypertensive chronic kidney disease with stage 1 through stage 4 chronic kidney disease, or unspecified chronic kidney disease: Secondary | ICD-10-CM | POA: Diagnosis not present

## 2017-09-24 DIAGNOSIS — E210 Primary hyperparathyroidism: Secondary | ICD-10-CM | POA: Diagnosis not present

## 2017-09-24 DIAGNOSIS — I313 Pericardial effusion (noninflammatory): Secondary | ICD-10-CM | POA: Diagnosis not present

## 2017-09-24 DIAGNOSIS — I429 Cardiomyopathy, unspecified: Secondary | ICD-10-CM | POA: Diagnosis not present

## 2017-09-24 DIAGNOSIS — N183 Chronic kidney disease, stage 3 (moderate): Secondary | ICD-10-CM | POA: Diagnosis not present

## 2017-10-28 DIAGNOSIS — N183 Chronic kidney disease, stage 3 (moderate): Secondary | ICD-10-CM | POA: Diagnosis not present

## 2017-10-28 DIAGNOSIS — I129 Hypertensive chronic kidney disease with stage 1 through stage 4 chronic kidney disease, or unspecified chronic kidney disease: Secondary | ICD-10-CM | POA: Diagnosis not present

## 2017-10-30 ENCOUNTER — Telehealth: Payer: Self-pay | Admitting: Hematology and Oncology

## 2017-10-30 NOTE — Telephone Encounter (Signed)
Lvm advising appt chg from 1/25 to 1/30 @ 10.45am due to md pal.

## 2017-11-11 ENCOUNTER — Other Ambulatory Visit: Payer: Medicare Other

## 2017-11-11 ENCOUNTER — Ambulatory Visit: Payer: Medicare Other | Admitting: Hematology and Oncology

## 2017-11-13 ENCOUNTER — Ambulatory Visit: Payer: Medicare Other | Admitting: Hematology and Oncology

## 2017-11-13 ENCOUNTER — Other Ambulatory Visit: Payer: Medicare Other

## 2017-11-17 ENCOUNTER — Other Ambulatory Visit: Payer: Self-pay

## 2017-11-17 DIAGNOSIS — C50412 Malignant neoplasm of upper-outer quadrant of left female breast: Secondary | ICD-10-CM

## 2017-11-17 DIAGNOSIS — Z171 Estrogen receptor negative status [ER-]: Principal | ICD-10-CM

## 2017-11-17 NOTE — Assessment & Plan Note (Signed)
Left breast invasive ductal carcinoma grade 3 ER 0% PR 0% HER-2 negative Ki-67 75%, 2.9 cm mass with microcalcifications plus abnormal lymph nodes biopsy-proven to be breast cancer T2 N1 M0 stage IIB clinical stage S/P Neoadjuvant Dose dense AC folll by Abraxane X 12  left lumpectomy 08/23/2015 : Invasive ductal carcinoma grade 3, 2.8 cm, associated extensive DCIS with comedonecrosis, margins negative,1/2 sentinel nodes positive, ER 0%, PR 0% T2 N1 M0 stage IIB Completed radiation therapy 11/16/2015  I offered the patient adjuvant Xeloda But she refused.  Hypercalcemia: Ultrasound of the neck had revealed a parathyroid adenoma. I instructed the patient to discuss the ultrasound and calcium levels with her primary care physician. She may need additional imaging and or surgery evaluation given her current degree of hypercalcemia. Abdominal pain and spasms with nausea and vomiting  Back pain: Bone scan 11/24/16: Normal Fatigue related to prior chemotherapy: Still taking time to improve.  Breast Cancer Surveillance: 1. Breast exam 11/18/2017: Normal 2. Mammogram 04/24/2016 at [facility] showed a 7 mm area of grouped calcifications in the left breast suspicious for malignancy.  Biopsy of this area on 04/28/2016 did not show any malignancy.  Return to clinic in 1 yr

## 2017-11-18 ENCOUNTER — Inpatient Hospital Stay (HOSPITAL_BASED_OUTPATIENT_CLINIC_OR_DEPARTMENT_OTHER): Payer: Medicare Other | Admitting: Hematology and Oncology

## 2017-11-18 ENCOUNTER — Telehealth: Payer: Self-pay | Admitting: Hematology and Oncology

## 2017-11-18 ENCOUNTER — Inpatient Hospital Stay: Payer: Medicare Other | Attending: Hematology and Oncology

## 2017-11-18 DIAGNOSIS — Z9221 Personal history of antineoplastic chemotherapy: Secondary | ICD-10-CM | POA: Insufficient documentation

## 2017-11-18 DIAGNOSIS — Z923 Personal history of irradiation: Secondary | ICD-10-CM

## 2017-11-18 DIAGNOSIS — Z79899 Other long term (current) drug therapy: Secondary | ICD-10-CM | POA: Diagnosis not present

## 2017-11-18 DIAGNOSIS — Z171 Estrogen receptor negative status [ER-]: Secondary | ICD-10-CM | POA: Diagnosis not present

## 2017-11-18 DIAGNOSIS — D351 Benign neoplasm of parathyroid gland: Secondary | ICD-10-CM | POA: Diagnosis not present

## 2017-11-18 DIAGNOSIS — C50412 Malignant neoplasm of upper-outer quadrant of left female breast: Secondary | ICD-10-CM | POA: Diagnosis not present

## 2017-11-18 LAB — CMP (CANCER CENTER ONLY)
ALBUMIN: 4.2 g/dL (ref 3.5–5.0)
ALT: 13 U/L (ref 0–55)
ANION GAP: 9 (ref 3–11)
AST: 18 U/L (ref 5–34)
Alkaline Phosphatase: 83 U/L (ref 40–150)
BUN: 20 mg/dL (ref 7–26)
CO2: 28 mmol/L (ref 22–29)
Calcium: 11 mg/dL — ABNORMAL HIGH (ref 8.4–10.4)
Chloride: 103 mmol/L (ref 98–109)
Creatinine: 1.41 mg/dL — ABNORMAL HIGH (ref 0.60–1.10)
GFR, Est AFR Am: 40 mL/min — ABNORMAL LOW (ref >60)
GFR, Estimated: 34 mL/min — ABNORMAL LOW (ref >60)
GLUCOSE: 90 mg/dL (ref 70–140)
POTASSIUM: 4.4 mmol/L (ref 3.5–5.1)
SODIUM: 140 mmol/L (ref 136–145)
Total Bilirubin: 0.7 mg/dL (ref 0.2–1.2)
Total Protein: 7.4 g/dL (ref 6.4–8.3)

## 2017-11-18 LAB — CBC WITH DIFFERENTIAL (CANCER CENTER ONLY)
Basophils Absolute: 0.1 10*3/uL (ref 0.0–0.1)
Basophils Relative: 1 %
Eosinophils Absolute: 0.2 10*3/uL (ref 0.0–0.5)
Eosinophils Relative: 3 %
HEMATOCRIT: 46.9 % — AB (ref 34.8–46.6)
Hemoglobin: 15.7 g/dL (ref 11.6–15.9)
LYMPHS PCT: 14 %
Lymphs Abs: 1.2 10*3/uL (ref 0.9–3.3)
MCH: 29.6 pg (ref 25.1–34.0)
MCHC: 33.4 g/dL (ref 31.5–36.0)
MCV: 88.7 fL (ref 79.5–101.0)
MONO ABS: 0.6 10*3/uL (ref 0.1–0.9)
MONOS PCT: 7 %
NEUTROS ABS: 6.9 10*3/uL — AB (ref 1.5–6.5)
Neutrophils Relative %: 75 %
Platelet Count: 309 10*3/uL (ref 145–400)
RBC: 5.29 MIL/uL (ref 3.70–5.45)
RDW: 13.6 % (ref 11.2–14.5)
WBC Count: 9 10*3/uL (ref 3.9–10.3)

## 2017-11-18 NOTE — Telephone Encounter (Signed)
Gave patient AVs and calendar of upcoming January 2020 appointments.

## 2017-11-18 NOTE — Progress Notes (Signed)
Patient Care Team: Carla Sheller, MD as PCP - General (Internal Medicine) Carla Luna, MD as Consulting Physician (General Surgery) Carla Lose, MD as Consulting Physician (Hematology and Oncology) Carla Pray, MD as Consulting Physician (Radiation Oncology) Carla Kaufmann, RN as Registered Nurse Carla Germany, RN as Registered Nurse Carla Shark Johny Blamer, NP as Nurse Practitioner (Hematology and Oncology)  DIAGNOSIS:  Encounter Diagnosis  Name Primary?  . Malignant neoplasm of upper-outer quadrant of left breast in female, estrogen receptor negative ([name])     SUMMARY OF ONCOLOGIC HISTORY:   Breast cancer of upper-outer quadrant of left female breast ([name])   02/15/2015 Mammogram    Left breast increased density, mass is irregular with microcalcifications measuring 2.9 cm, cyst in the breast as well as abnormal enlarged lymph nodes      02/20/2015 Initial Diagnosis    Left breast biopsy: Invasive ductal carcinoma with DCIS, axillary lymph node biopsy positive, ER 0%, PR 0%, HER-2 negative ratio 1.13 with Ki-67 75%      03/08/2015 Procedure    OvaNext panel Carla Bennett) reveals no clinically significant variant at ATM, BARD1, BRCA1, BRCA2, BRIP1, CDH1, CHEK2, EPCAM, MLH1, MRE11A, MSH2, MSH6, MUTYH, NBN, NF1, PALB2, PMS2, PTEN, RAD50, RAD51C, RAD51D, SMARCA4, STK11, and TP53.       03/15/2015 PET scan    2.8 cm solid irregular left breast mass which is hypermetabolic and consistent with known left breast cancer. 2. Weakly positive axillary lymph nodes or equivocal      03/20/2015 Breast MRI    3.1 cm irregular enhancing mass located within the left breast at the 1 o'clock position; 1.6 cm Left axillary LN      03/20/2015 Clinical Stage    Stage IIB: T2 N1      03/22/2015 - 08/02/2015 Neo-Adjuvant Chemotherapy    Neo-adjuvant chemotherapy with dose dense Adriamycin and Cytoxan 4 followed by Abraxane weekly 12      08/06/2015 Breast MRI    Left breast now measures  0.9 x 0.8 x 1.9 cm (previously measured 3.0 x 3.1 x 1.8 cm). Dec size of abnormal Left axill LN      08/23/2015 Surgery    Left lumpectomy: Invasive ductal carcinoma grade 3, 2.8 cm, associated extensive DCIS with comedonecrosis, margins negative,1/2 sentinel nodes positive, ER 0%, PR 0%, HER2/neu repeated and remains negative (ratio 1.13)      08/23/2015 Pathologic Stage    Stage IIB: ypT2 ypN1      10/01/2015 - 11/16/2015 Radiation Therapy    Adjuvant RT: Left breast axillary and supraclavicular region, 45 gray in 25 fractions, the left breast received 3 additional treatments for a cumulative dose of 50.4 gray. Lumpectomy cavity boost 10 gray in 5 fractions      12/27/2015 Survivorship    Survivorship visit completed and copy of care plan provided to patient       CHIEF COMPLIANT: Surveillance of breast cancer  INTERVAL HISTORY: Carla Bennett is a 82 year old with above-mentioned history left breast cancer treated with neoadjuvant chemotherapy followed by lumpectomy.  She still had residual disease and followed up with adjuvant radiation therapy.  She is here for follow-up visit and reports that her health has been excellent.  She denies any lumps or nodules in the breast.  She has some breast tenderness but that is nothing new.  REVIEW OF SYSTEMS:   Constitutional: Denies fevers, chills or abnormal weight loss Eyes: Denies blurriness of vision Ears, nose, mouth, throat, and face: Denies mucositis or sore throat Respiratory:  Denies cough, dyspnea or wheezes Cardiovascular: Denies palpitation, chest discomfort Gastrointestinal:  Denies nausea, heartburn or change in bowel habits Skin: Denies abnormal skin rashes Lymphatics: Denies new lymphadenopathy or easy bruising Neurological:Denies numbness, tingling or new weaknesses Behavioral/Psych: Mood is stable, no new changes  Extremities: No lower extremity edema Breast: Mild left breast tenderness All other systems were reviewed  with the patient and are negative.  I have reviewed the past medical history, past surgical history, social history and family history with the patient and they are unchanged from previous note.  ALLERGIES:  has No Known Allergies.  MEDICATIONS:  Current Outpatient Medications  Medication Sig Dispense Refill  . alendronate (FOSAMAX) 35 MG tablet Take 35 mg by mouth once a week.  6  . B Complex Vitamins (VITAMIN B COMPLEX PO) Take 1 tablet by mouth daily.     . carvedilol (COREG) 12.5 MG tablet Take 1.5 tablets (18.75 mg total) by mouth 2 (two) times daily. 270 tablet 1  . furosemide (LASIX) 20 MG tablet Take 1 tablet (20 mg total) by mouth daily. 30 tablet 6  . hyoscyamine (LEVSIN, ANASPAZ) 0.125 MG tablet TAKE 1 TABLET EVERY 4-6 HOURS AS NEEDED FOR ABDOMINAL PAIN 15 tablet 0  . lisinopril (PRINIVIL,ZESTRIL) 40 MG tablet Take 40 mg by mouth daily.     Marland Kitchen loratadine (CLARITIN) 10 MG tablet Take 10 mg by mouth daily.    Marland Kitchen spironolactone (ALDACTONE) 25 MG tablet TAKE ONE-HALF TABLET (12.5 MG TOTAL) BY MOUTH DAILY. 15 tablet 8   No current facility-administered medications for this visit.     PHYSICAL EXAMINATION: ECOG PERFORMANCE STATUS: 1 - Symptomatic but completely ambulatory  Vitals:   11/18/17 1040  Pulse: 65  Resp: 18  Temp: 98.3 F (36.8 C)  SpO2: 98%   Filed Weights   11/18/17 1040  Weight: 161 lb 1.6 oz (73.1 kg)    GENERAL:alert, no distress and comfortable SKIN: skin color, texture, turgor are normal, no rashes or significant lesions EYES: normal, Conjunctiva are pink and non-injected, sclera clear OROPHARYNX:no exudate, no erythema and lips, buccal mucosa, and tongue normal  NECK: supple, thyroid normal size, non-tender, without nodularity LYMPH:  no palpable lymphadenopathy in the cervical, axillary or inguinal LUNGS: clear to auscultation and percussion with normal breathing effort HEART: regular rate & rhythm and no murmurs and no lower extremity  edema ABDOMEN:abdomen soft, non-tender and normal bowel sounds MUSCULOSKELETAL:no cyanosis of digits and no clubbing  NEURO: alert & oriented x 3 with fluent speech, no focal motor/sensory deficits EXTREMITIES: No lower extremity edema BREAST: No palpable masses or nodules in either right or left breasts. No palpable axillary supraclavicular or infraclavicular adenopathy no breast tenderness or nipple discharge. (exam performed in the presence of a chaperone)  LABORATORY DATA:  I have reviewed the data as listed CMP Latest Ref Rng & Units 11/18/2017 04/02/2017 11/14/2016  Glucose 70 - 140 mg/dL 90 100(H) 103  BUN 7 - 26 mg/dL 20 17 20.4  Creatinine 0.44 - 1.00 mg/dL - 1.40(H) 1.3(H)  Sodium 136 - 145 mmol/L 140 136 139  Potassium 3.5 - 5.1 mmol/L 4.4 4.1 4.8  Chloride 98 - 109 mmol/L 103 104 -  CO2 22 - 29 mmol/L _0 Calcium 8.4 - 10.4 mg/dL 11.0(H) 10.5(H) 11.5(H)  Total Protein 6.4 - 8.3 g/dL 7.4 7.1 7.2  Total Bilirubin 0.2 - 1.2 mg/dL 0.7 0.8 1.25(H)  Alkaline Phos 40 - 150 U/L 83 72 96  AST 5 - 34 U/L 18  25 17  ALT 0 - 55 U/L _0 Lab Results  Component Value Date   WBC 9.0 11/18/2017   HGB 16.0 (H) 04/02/2017   HCT 46.9 (H) 11/18/2017   MCV 88.7 11/18/2017   PLT 309 11/18/2017   NEUTROABS 6.9 (H) 11/18/2017    ASSESSMENT & PLAN:  Breast cancer of upper-outer quadrant of left female breast Left breast invasive ductal carcinoma grade 3 ER 0% PR 0% HER-2 negative Ki-67 75%, 2.9 cm mass with microcalcifications plus abnormal lymph nodes biopsy-proven to be breast cancer T2 N1 M0 stage IIB clinical stage S/P Neoadjuvant Dose dense AC folll by Abraxane X 12  left lumpectomy 08/23/2015 : Invasive ductal carcinoma grade 3, 2.8 cm, associated extensive DCIS with comedonecrosis, margins negative,1/2 sentinel nodes positive, ER 0%, PR 0% T2 N1 M0 stage IIB Completed radiation therapy 11/16/2015  I offered the patient adjuvant Xeloda But she  refused.  Hypercalcemia: Ultrasound of the neck had revealed a parathyroid adenoma. Back pain: Bone scan 11/24/16: Normal Fatigue related to prior chemotherapy: Much improved  Breast Cancer Surveillance: 1. Breast exam 11/18/2017: Normal 2. Mammogram  04/30/2017 at [facility]: Benign 3.  Bone density 04/30/2017 at [facility]: T score -0.7: Normal  Return to clinic in 1 yr  I spent 25 minutes talking to the patient of which more than half was spent in counseling and coordination of care.  No orders of the defined types were placed in this encounter.  The patient has a good understanding of the overall plan. she agrees with it. she will call with any problems that may develop before the next visit here.   Harriette Ohara, MD 11/18/17

## 2017-12-02 DIAGNOSIS — H2513 Age-related nuclear cataract, bilateral: Secondary | ICD-10-CM | POA: Diagnosis not present

## 2017-12-14 ENCOUNTER — Other Ambulatory Visit: Payer: Self-pay | Admitting: Cardiology

## 2017-12-14 DIAGNOSIS — I420 Dilated cardiomyopathy: Secondary | ICD-10-CM

## 2017-12-14 MED ORDER — SPIRONOLACTONE 25 MG PO TABS: ORAL_TABLET | ORAL | 0 refills | Status: DC

## 2017-12-21 ENCOUNTER — Telehealth: Payer: Self-pay | Admitting: Physician Assistant

## 2017-12-21 ENCOUNTER — Other Ambulatory Visit: Payer: Self-pay | Admitting: Physician Assistant

## 2017-12-21 MED ORDER — HYOSCYAMINE SULFATE 0.125 MG PO TABS: ORAL_TABLET | ORAL | 0 refills | Status: DC

## 2017-12-21 NOTE — Telephone Encounter (Signed)
The pt has been advised that she needs an appt for further refills.  Appt made and 1 month refill sent

## 2017-12-21 NOTE — Telephone Encounter (Signed)
Patient states she needs medication hyoscyamine refilled at [facility] in [place] on [address]. Notified patient that she might need follow up.

## 2018-01-04 DIAGNOSIS — Z853 Personal history of malignant neoplasm of breast: Secondary | ICD-10-CM | POA: Diagnosis not present

## 2018-01-19 ENCOUNTER — Other Ambulatory Visit: Payer: Self-pay | Admitting: Cardiology

## 2018-01-19 DIAGNOSIS — I420 Dilated cardiomyopathy: Secondary | ICD-10-CM

## 2018-01-26 ENCOUNTER — Ambulatory Visit (INDEPENDENT_AMBULATORY_CARE_PROVIDER_SITE_OTHER): Payer: Medicare Other | Admitting: Internal Medicine

## 2018-01-26 ENCOUNTER — Encounter: Payer: Self-pay | Admitting: Internal Medicine

## 2018-01-26 VITALS — BP 132/82 | HR 76 | Ht 61.0 in | Wt 162.4 lb

## 2018-01-26 DIAGNOSIS — R1033 Periumbilical pain: Secondary | ICD-10-CM | POA: Diagnosis not present

## 2018-01-26 DIAGNOSIS — K219 Gastro-esophageal reflux disease without esophagitis: Secondary | ICD-10-CM

## 2018-01-26 DIAGNOSIS — R109 Unspecified abdominal pain: Secondary | ICD-10-CM

## 2018-01-26 MED ORDER — HYOSCYAMINE SULFATE 0.125 MG PO TABS: ORAL_TABLET | ORAL | 11 refills | Status: DC

## 2018-01-26 NOTE — Progress Notes (Signed)
HISTORY OF PRESENT ILLNESS:  Carla Bennett is a 83 y.o. female with past medical history as listed below who was seen in this office by the GI physician assistant August 2017 regarding abdominal pain. Previous patient of Dr. Delfin Edis. As part of the patient's workup she underwent CT scan of the abdomen and pelvis with contrast. Examination revealed multiple incidental findings. No evidence for significant vascular disease. Soft tissue prominence of the cecum measuring 3 cm was said to be indeterminate for which colonoscopy was recommended. Thus, the patient underwent complete colonoscopy 06/26/2016. She was found to have diverticulosis and internal hemorrhoids. No other abnormalities. The cecum was unremarkable. She was felt to have intermittent postprandial spasm for which led as an sublingual was provided. She tells me that she will use this approximately 4 times per year when she gets postprandial abdominal discomfort which last less than 30 minutes. The medication promptly helps. She does have some intermittent nausea occasional rectal pressure and bloating. She also reports significant problems with acid reflux but does not take any medication. No dysphagia. Review of outside laboratories from 11/18/2017 finds hemoglobin of 15.7 and normal liver tests. She is status post cholecystectomy  REVIEW OF SYSTEMS:  All non-GI ROS negative unless otherwise stated in the history of present illness except for sinus and allergy, arthritis, back pain, heart rhythm change, hearing problems, fatigue, shortness of breath  Past Medical History:  Diagnosis Date  . Allergy   . Aortic regurgitation   . Arthritis    hands, neck  . Basal cell carcinoma   . Benign neoplasm of colon   . Breast cancer of upper-outer quadrant of left female breast ([place]) 02/23/2015   4'16-left breast cancer-surgery, chemo, radiation-[name] follows every 3 months  . Cataract    beginning stages-monitoring by MD  . Cholelithiasis     resolved with surgery  . Chronic systolic CHF (congestive heart failure) ([place]) 02/26/2016   A.  Echo 02/21/16:  Mild LVH, EF 20-25%, diff HK, Gr 3 DD, mod AI, mod to severe MR, mod LAE, mod redcued RVSF, mild RAE, mod TR, PASP 53 mmHg, small pericardial effusion, mod L pleural effusion  //  B. Echo 6/17:  Mild LVH EF 30-35%, diffuse HK, grade 1 diastolic dysfunction, trivial AI, MAC, mild LAE, normal RVSF, trivial pericardial effusion    . Cystocele   . Diverticulitis   . Diverticulosis of colon (without mention of hemorrhage)   . Gout    resolved -  . Hearing loss    some hearing loss in right ear- no hearing aids  . Heart murmur   . Hematuria    negative urology work up  . History of blood transfusion 1971   x 1 with D&C -   . Hx of abnormal cervical Pap smear    LEEP-CIN I, negative margins and ECC  . Hypercalcemia   . Hypertension   . Mitral regurgitation   . 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  . OA (osteoarthritis) of hip    right  . Other issue of medical certificates    uterine myomata  . Parathyroid abnormality ([facility])    being followed Dr. Barbette Hair  . Radiation 10/01/15-11/16/15   left breast axillary and supraclavicular reigion 45 gray, left breast 50.4 gray, lumpectomy cavity boost 10 gray  . Renal disease 3/16   Stage 3 kidney disease-seeing Dr Buddy Duty and Dr Marval Regal  . Uterine prolaps    resolved after surgery  .  Vitamin D deficiency     Past Surgical History:  Procedure Laterality Date  . BREAST LUMPECTOMY Left 08/2015  . CHOLECYSTECTOMY    . COLONOSCOPY  01/18/2013   [name]  . COLONOSCOPY N/A 06/26/2016   Procedure: COLONOSCOPY;  Surgeon: Irene Shipper, MD;  Location: [facility];  Service: Endoscopy;  Laterality: N/A;  . CYSTOCELE REPAIR  08/2010   Rectocele, vault prolapse  . DILATION AND CURETTAGE OF UTERUS     x2  . PORT-A-CATH REMOVAL Right 08/23/2015   Procedure: REMOVAL PORT-A-CATH;  Surgeon: Erroll Luna, MD;   Location: [place];  Service: General;  Laterality: Right;  . PORTACATH PLACEMENT Right 03/20/2015   Procedure: INSERTION PORT-A-CATH;  Surgeon: Erroll Luna, MD;  Location: [facility];  Service: General;  Laterality: Right;  . RADIOACTIVE SEED GUIDED PARTIAL MASTECTOMY WITH AXILLARY SENTINEL LYMPH NODE BIOPSY Left 08/23/2015   Procedure: LEFT BREAST PARTIAL MASTECTOMY WITH SENTINEL LYMPH NODE MAPPING;  Surgeon: Erroll Luna, MD;  Location: [place];  Service: General;  Laterality: Left;  . TONSILLECTOMY AND ADENOIDECTOMY    . TOTAL VAGINAL HYSTERECTOMY     secondary to prolapse, ovaries not removed    Social History Anitta L Mcneal  reports that she has never smoked. She has never used smokeless tobacco. She reports that she drinks alcohol. She reports that she does not use drugs.  family history includes Alzheimer's disease in her mother; Breast cancer (age of onset: 65) in her sister; Cancer in her maternal uncle; Cancer (age of onset: 32) in her cousin and maternal uncle; Lung cancer in her mother; Lung cancer (age of onset: 50) in her father; Multiple sclerosis in her sister; Parkinson's disease in her mother; Prostate cancer (age of onset: 62) in her maternal uncle.  No Known Allergies     PHYSICAL EXAMINATION: Vital signs: BP 132/82   Pulse 76   Ht 5' 1"  (1.549 m)   Wt 162 lb 6 oz (73.7 kg)   LMP 10/20/1996   BMI 30.68 kg/m   Constitutional: generally well-appearing, no acute distress Psychiatric: alert and oriented x3, cooperative Eyes: extraocular movements intact, anicteric, conjunctiva pink Mouth: oral pharynx moist, no lesions Neck: supple no lymphadenopathy Cardiovascular: heart regular rate and rhythm, no murmur Lungs: clear to auscultation bilaterally Abdomen: soft, nontender, nondistended, no obvious ascites, no peritoneal signs, normal bowel sounds, no organomegaly Rectal:omitted Extremities: no clubbing, cyanosis, or lower  extremity edema bilaterally Skin: no lesions on visible extremities Neuro: No focal deficits. Cranial nerves intact  ASSESSMENT:  #1. Occasional postprandial abdominal discomfort improved with antispasmodics. Likely secondary to diverticular spasm #2. GERD. Active symptoms #3. Complete colonoscopy September 2017 as described   PLAN:  #1. Refill Levsin sublingual #2. Reflux precautions #3. Recommended omeprazole 20 mg daily for active GERD symptoms #4. Routine GI follow-up as needed  25 minutes spent face-to-face with the patient. Greater than 50% a time use for counseling Regarding her multiple GI complaints and there recommended management

## 2018-01-26 NOTE — Patient Instructions (Signed)
We have sent the following medications to your pharmacy for you to pick up at your convenience:  Levsin  You may take Prilosec over the counter

## 2018-01-28 ENCOUNTER — Telehealth: Payer: Self-pay | Admitting: *Deleted

## 2018-01-28 NOTE — Telephone Encounter (Signed)
Spoke with patient who is concerned about her BP.  She reports most every morning when she gets up it is too high.  504 systolic.  This AM it was 152/75.  She reports after taking her medication it drops to around 100/50.  Today it dropped to 102/58.  When this occurs she feel extremely weak.   She states any BP less than 130 causes her to feel wiped out.  HR 71-75.  She reports taking carvedilol  12.5 mg BID, Furosemide 20 mg a day, Lisinopril 40 mg every evening and spironolactone 25 mg 1/2 tablet a day.  Advised I will forward this to Dr Marlou Porch for review and recommends.  She would like to be seen ASAP however the first appt available is May 3.  Pt is aware I will continue to look for an appt for her.

## 2018-02-01 NOTE — Telephone Encounter (Signed)
Pt aware and in agreement.  She will try taking spironolactone at bedtime and see how she does.  She will c/b if further questions or concerns.

## 2018-02-01 NOTE — Telephone Encounter (Signed)
Since you are having high blood pressures in the early morning, let us try taking the spironolactone 12.5 mg once a day before bedtime.  If you are noticing that you are having to get up to go to the bathroom more often we can always change it back to the morning time.  Candee Furbish, MD

## 2018-02-19 ENCOUNTER — Ambulatory Visit (INDEPENDENT_AMBULATORY_CARE_PROVIDER_SITE_OTHER): Payer: Medicare Other | Admitting: Physician Assistant

## 2018-02-19 VITALS — BP 130/74 | HR 69 | Ht 61.0 in | Wt 165.0 lb

## 2018-02-19 DIAGNOSIS — I5022 Chronic systolic (congestive) heart failure: Secondary | ICD-10-CM | POA: Diagnosis not present

## 2018-02-19 DIAGNOSIS — I3139 Other pericardial effusion (noninflammatory): Secondary | ICD-10-CM

## 2018-02-19 DIAGNOSIS — I420 Dilated cardiomyopathy: Secondary | ICD-10-CM | POA: Diagnosis not present

## 2018-02-19 DIAGNOSIS — I313 Pericardial effusion (noninflammatory): Secondary | ICD-10-CM

## 2018-02-19 DIAGNOSIS — R0989 Other specified symptoms and signs involving the circulatory and respiratory systems: Secondary | ICD-10-CM

## 2018-02-19 MED ORDER — CARVEDILOL 12.5 MG PO TABS: 12.5000 mg | ORAL_TABLET | Freq: Two times a day (BID) | ORAL | 6 refills | Status: DC

## 2018-02-19 MED ORDER — SPIRONOLACTONE 25 MG PO TABS: 12.5000 mg | ORAL_TABLET | Freq: Every day | ORAL | 5 refills | Status: DC

## 2018-02-19 NOTE — Patient Instructions (Addendum)
Medication Instructions:   Your physician recommends that you continue on your current medications as directed. Please refer to the Current Medication list given to you today.   If you need a refill on your cardiac medications before your next appointment, please call your pharmacy.  Labwork:  NONE ORDERED  TODAY    Testing/Procedures:  NONE ORDERED  TODAY    Follow-Up:  IN 3 MONTHS  WITH DR SKAINS ONLY!!!  PER PATIENT    Any Other Special Instructions Will Be Listed Below (If Applicable).

## 2018-02-19 NOTE — Progress Notes (Signed)
Cardiology Office Note    Date:  02/19/2018   ID:  Carla Bennett, DOB [birth year], MRN [id number]  PCP:  System, Pcp Not In  Cardiologist:  Dr. Marlou Porch   Chief Complaint: blood pressure  History of Present Illness:   Carla Bennett is a 83 y.o. female with hx of chronic systolic CHF with improved LVEF (now 45-50%), pericardial effusion, breast cancer s/p chemo, CKD stage III and hypertension presents for fluctuating blood pressure.   Moderate-sized pericardial effusion (no prior hx) was personally viewed on CT scan from 02/07/16. Aortic atherosclerosis and mild coronary artery atherosclerosis was also noted. Pleural effusions were also noted. Echo 02/21/16 showed LVEF of 20-25% (reduced from prior) with grade 3 DD and small pericardial effusion. Echo 02/24/16 showed improved LVEF to 30-35%; no effusion noted.   In regards to her cardiomyopathy, It likely felt nonischemic, likely from chemotherapy or perhaps viral mediated. Dr. Marlou Porch, no obvious coronary calcium detected. Last echo 01/2017 showed improved LVEF to 45-50% - there is inferior and inferolateral hypokinesis.  Recently noted elevated BP to 170s in morning . After taking morning meds, BP drops to 100s. Advised to take Spironolactone at bedtime.   Here today for blood pressure follow up.  BP now improved to 140-150s in AM; 115-135 in afternoon and 160s in bedtime (10pm) on Coreg 12.66m BID, Lasix 218min AM, Lisinoprol 4042mn PM and spironolactone 12.5mg64m PM. She feels "fatigue" SBP below 120s. She drink water and feels better after wards. No chest pain, dyspnea, palpitation, orthopnea, PND, LE edema or melena. No daytime tiredness or snoring.   Past Medical History:  Diagnosis Date  . Allergy   . Aortic regurgitation   . Arthritis    hands, neck  . Basal cell carcinoma   . Benign neoplasm of colon   . Breast cancer of upper-outer quadrant of left female breast (HCC)[place]6/2016   4'16-left breast cancer-surgery, chemo,  radiation-[name] follows every 3 months  . Cataract    beginning stages-monitoring by MD  . Cholelithiasis    resolved with surgery  . Chronic systolic CHF (congestive heart failure) (HCC)[place]9/2017   A.  Echo 02/21/16:  Mild LVH, EF 20-25%, diff HK, Gr 3 DD, mod AI, mod to severe MR, mod LAE, mod redcued RVSF, mild RAE, mod TR, PASP 53 mmHg, small pericardial effusion, mod L pleural effusion  //  B. Echo 6/17:  Mild LVH EF 30-35%, diffuse HK, grade 1 diastolic dysfunction, trivial AI, MAC, mild LAE, normal RVSF, trivial pericardial effusion    . Cystocele   . Diverticulitis   . Diverticulosis of colon (without mention of hemorrhage)   . Gout    resolved -  . Hearing loss    some hearing loss in right ear- no hearing aids  . Heart murmur   . Hematuria    negative urology work up  . History of blood transfusion 1971   x 1 with D&C -   . Hx of abnormal cervical Pap smear    LEEP-CIN I, negative margins and ECC  . Hypercalcemia   . Hypertension   . Mitral regurgitation   . 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  . OA (osteoarthritis) of hip    right  . Other issue of medical certificates    uterine myomata  . Parathyroid abnormality (HCC)    being followed Dr. J. KBarbette HairRadiation 10/01/15-11/16/15   left breast axillary and supraclavicular  reigion 45 gray, left breast 50.4 gray, lumpectomy cavity boost 10 gray  . Renal disease 3/16   Stage 3 kidney disease-seeing Dr Buddy Duty and Dr Marval Regal  . Uterine prolaps    resolved after surgery  . Vitamin D deficiency     Past Surgical History:  Procedure Laterality Date  . BREAST LUMPECTOMY Left 08/2015  . CHOLECYSTECTOMY    . COLONOSCOPY  01/18/2013   [name]  . COLONOSCOPY N/A 06/26/2016   Procedure: COLONOSCOPY;  Surgeon: Irene Shipper, MD;  Location: [facility];  Service: Endoscopy;  Laterality: N/A;  . CYSTOCELE REPAIR  08/2010   Rectocele, vault prolapse  . DILATION AND CURETTAGE OF UTERUS     x2    . PORT-A-CATH REMOVAL Right 08/23/2015   Procedure: REMOVAL PORT-A-CATH;  Surgeon: Erroll Luna, MD;  Location: [facility];  Service: General;  Laterality: Right;  . PORTACATH PLACEMENT Right 03/20/2015   Procedure: INSERTION PORT-A-CATH;  Surgeon: Erroll Luna, MD;  Location: [facility];  Service: General;  Laterality: Right;  . RADIOACTIVE SEED GUIDED PARTIAL MASTECTOMY WITH AXILLARY SENTINEL LYMPH NODE BIOPSY Left 08/23/2015   Procedure: LEFT BREAST PARTIAL MASTECTOMY WITH SENTINEL LYMPH NODE MAPPING;  Surgeon: Erroll Luna, MD;  Location: [facility];  Service: General;  Laterality: Left;  . TONSILLECTOMY AND ADENOIDECTOMY    . TOTAL VAGINAL HYSTERECTOMY     secondary to prolapse, ovaries not removed    Current Medications: Prior to Admission medications   Medication Sig Start Date End Date Taking? Authorizing Provider  B Complex Vitamins (VITAMIN B COMPLEX PO) Take 1 tablet by mouth daily.     [provider]  carvedilol (COREG) 12.5 MG tablet Take 1.5 tablets (18.75 mg total) by mouth 2 (two) times daily. 05/28/17   Jerline Pain, MD  furosemide (LASIX) 20 MG tablet Take 1 tablet (20 mg total) by mouth daily. 01/12/17   Jerline Pain, MD  hyoscyamine (LEVSIN, ANASPAZ) 0.125 MG tablet TAKE 1 TABLET EVERY 4-6 HOURS AS NEEDED FOR ABDOMINAL PAIN 01/26/18   Irene Shipper, MD  lisinopril (PRINIVIL,ZESTRIL) 40 MG tablet Take 40 mg by mouth daily.  09/01/13   [provider]  loratadine (CLARITIN) 10 MG tablet Take 10 mg by mouth daily.    [provider]  spironolactone (ALDACTONE) 25 MG tablet TAKE ONE-HALF TABLET BY MOUTH DAILY. Please make overdue yearly appt with Dr. Marlou Porch before anymore refills. 2nd attempt 01/19/18   Jerline Pain, MD    Allergies:   Patient has no known allergies.   Social History   Socioeconomic History  . Marital status: Widowed    Spouse name: Not on file  . Number of children: 4  . Years of education: Not  on file  . Highest education level: Not on file  Occupational History  . Occupation: retired  Scientific laboratory technician  . Financial resource strain: Not on file  . Food insecurity:    Worry: Not on file    Inability: Not on file  . Transportation needs:    Medical: Not on file    Non-medical: Not on file  Tobacco Use  . Smoking status: Never Smoker  . Smokeless tobacco: Never Used  Substance and Sexual Activity  . Alcohol use: Yes    Comment: occasionally  wine/beer  . Drug use: No  . Sexual activity: Never    Birth control/protection: Surgical    Comment: Vaginal hysterectomy  Lifestyle  . Physical activity:    Days per week:  Not on file    Minutes per session: Not on file  . Stress: Not on file  Relationships  . Social connections:    Talks on phone: Not on file    Gets together: Not on file    Attends religious service: Not on file    Active member of club or organization: Not on file    Attends meetings of clubs or organizations: Not on file    Relationship status: Not on file  Other Topics Concern  . Not on file  Social History Narrative  . Not on file     Family History:  The patient's family history includes Alzheimer's disease in her mother; Breast cancer (age of onset: 91) in her sister; Cancer in her maternal uncle; Cancer (age of onset: 31) in her cousin and maternal uncle; Lung cancer in her mother; Lung cancer (age of onset: 38) in her father; Multiple sclerosis in her sister; Parkinson's disease in her mother; Prostate cancer (age of onset: 5) in her maternal uncle.   ROS:   Please see the history of present illness.    ROS All other systems reviewed and are negative.   PHYSICAL EXAM:   VS:  BP 130/74   Pulse 69   Ht 5' 1"  (1.549 m)   Wt 165 lb (74.8 kg)   LMP 10/20/1996   BMI 31.18 kg/m    GEN: Well nourished, well developed, in no acute distress  HEENT: normal  Neck: no JVD, carotid bruits, or masses Cardiac: RRR; no murmurs, rubs, or gallops,no edema   Respiratory:  clear to auscultation bilaterally, normal work of breathing GI: soft, nontender, nondistended, + BS MS: no deformity or atrophy  Skin: warm and dry, no rash Neuro:  Alert and Oriented x 3, Strength and sensation are intact Psych: euthymic mood, full affect  Wt Readings from Last 3 Encounters:  02/19/18 165 lb (74.8 kg)  01/26/18 162 lb 6 oz (73.7 kg)  11/18/17 161 lb 1.6 oz (73.1 kg)      Studies/Labs Reviewed:   EKG:  EKG is ordered today.  The ekg ordered today demonstrates NSR  Recent Labs: 11/18/2017: ALT 13; BUN 20; Creatinine 1.41; Hemoglobin 15.7; Platelet Count 309; Potassium 4.4; Sodium 140   Lipid Panel No results found for: CHOL, TRIG, HDL, CHOLHDL, VLDL, LDLCALC, LDLDIRECT  Additional studies/ records that were reviewed today include:   As summarized above  ASSESSMENT & PLAN:    1. Chronic systolic CHF with likely non ischemic cardiomyopathy -Last echo 01/2017 showed improved LVEF to 45-50% - there is inferior and inferolateral hypokinesis. - Continue BB, ACE and lasix.   2. hx of Pericardiac effusion - Last echo 01/2017 is reassuring  3. Labile hypertension - BP improved after recent adjustment. She feels after when SBP less than 120s, mostly in afternoon. Improves with fluid intake. Denies snoring or daytime sleepiness. Bedtime blood pressure in 160s  Range prior to lisinopril and spironolactone. She has CKD stage III. Discussed renal artery doppler. She has appointment with Dr. Marval Regal in 2 weeks. She will discuss at that time. If ongoing issue, may consider 24 hours blood pressure monitor. No change made today. She wants to discuss with Dr. Marval Regal.  - Continue Coreg 12.62m BID, Lasix 259min AM, Lisinoprol 4033mn PM and spironolactone 12.5mg52m PM.  Medication Adjustments/Labs and Tests Ordered: Current medicines are reviewed at length with the patient today.  Concerns regarding medicines are outlined above.  Medication changes, Labs and  Tests ordered  today are listed in the Patient Instructions below. Patient Instructions  Medication Instructions:     If you need a refill on your cardiac medications before your next appointment, please call your pharmacy.  Labwork:   Testing/Procedures:   Follow-Up:   Any Other Special Instructions Will Be Listed Below (If Applicable).                                                                                                                                                      Jarrett Soho, [place]  02/19/2018 3:39 PM    [facility] [address], [place], [place]  [address] Phone: [phone]; Fax: [phone]

## 2018-03-04 DIAGNOSIS — Z6829 Body mass index (BMI) 29.0-29.9, adult: Secondary | ICD-10-CM | POA: Diagnosis not present

## 2018-03-04 DIAGNOSIS — I129 Hypertensive chronic kidney disease with stage 1 through stage 4 chronic kidney disease, or unspecified chronic kidney disease: Secondary | ICD-10-CM | POA: Diagnosis not present

## 2018-03-04 DIAGNOSIS — N2581 Secondary hyperparathyroidism of renal origin: Secondary | ICD-10-CM | POA: Diagnosis not present

## 2018-03-04 DIAGNOSIS — I313 Pericardial effusion (noninflammatory): Secondary | ICD-10-CM | POA: Diagnosis not present

## 2018-03-04 DIAGNOSIS — N183 Chronic kidney disease, stage 3 (moderate): Secondary | ICD-10-CM | POA: Diagnosis not present

## 2018-03-04 DIAGNOSIS — E210 Primary hyperparathyroidism: Secondary | ICD-10-CM | POA: Diagnosis not present

## 2018-03-04 DIAGNOSIS — C50919 Malignant neoplasm of unspecified site of unspecified female breast: Secondary | ICD-10-CM | POA: Diagnosis not present

## 2018-03-04 DIAGNOSIS — I429 Cardiomyopathy, unspecified: Secondary | ICD-10-CM | POA: Diagnosis not present

## 2018-04-28 DIAGNOSIS — E042 Nontoxic multinodular goiter: Secondary | ICD-10-CM | POA: Diagnosis not present

## 2018-04-28 DIAGNOSIS — E210 Primary hyperparathyroidism: Secondary | ICD-10-CM | POA: Diagnosis not present

## 2018-05-04 DIAGNOSIS — Z1231 Encounter for screening mammogram for malignant neoplasm of breast: Secondary | ICD-10-CM | POA: Diagnosis not present

## 2018-05-04 DIAGNOSIS — Z803 Family history of malignant neoplasm of breast: Secondary | ICD-10-CM | POA: Diagnosis not present

## 2018-05-04 DIAGNOSIS — Z853 Personal history of malignant neoplasm of breast: Secondary | ICD-10-CM | POA: Diagnosis not present

## 2018-05-04 DIAGNOSIS — Z780 Asymptomatic menopausal state: Secondary | ICD-10-CM | POA: Diagnosis not present

## 2018-05-04 DIAGNOSIS — M85851 Other specified disorders of bone density and structure, right thigh: Secondary | ICD-10-CM | POA: Diagnosis not present

## 2018-05-26 ENCOUNTER — Encounter: Payer: Self-pay | Admitting: Cardiology

## 2018-05-26 ENCOUNTER — Ambulatory Visit (INDEPENDENT_AMBULATORY_CARE_PROVIDER_SITE_OTHER): Payer: Medicare Other | Admitting: Cardiology

## 2018-05-26 VITALS — BP 146/80 | HR 69 | Ht 61.0 in | Wt 161.6 lb

## 2018-05-26 DIAGNOSIS — I10 Essential (primary) hypertension: Secondary | ICD-10-CM | POA: Diagnosis not present

## 2018-05-26 DIAGNOSIS — R0989 Other specified symptoms and signs involving the circulatory and respiratory systems: Secondary | ICD-10-CM | POA: Diagnosis not present

## 2018-05-26 DIAGNOSIS — I420 Dilated cardiomyopathy: Secondary | ICD-10-CM

## 2018-05-26 NOTE — Patient Instructions (Signed)
Medication Instructions:  Your physician recommends that you continue on your current medications as directed. Please refer to the Current Medication list given to you today.  Follow-Up: Your physician wants you to follow-up in: 6 month with Dr. Marlou Porch. You will receive a reminder letter in the mail two months in advance. If you don't receive a letter, please call our office to schedule the follow-up appointment.  If you need a refill on your cardiac medications before your next appointment, please call your pharmacy.

## 2018-05-26 NOTE — Progress Notes (Signed)
Cardiology Office Note    Date:  05/26/2018   ID:  Carla Bennett, DOB [birth year], MRN [id number]  PCP:  System, Pcp Not In  Cardiologist:   Candee Furbish, MD     History of Present Illness:  Carla Bennett is a 83 y.o. female here for follow-up of systolic heart failure, cardiomyopathy, previous chemotherapy for breast cancer, pericardial effusion.  She had an echo performed on 03/13/15 which at that time showed no evidence of pericardial effusion. Mild mitral and aortic regurgitation noted. Normal ejection fraction. This was pre-chemotherapy to breast cancer.  Moderate-sized pericardial effusion was personally viewed on CT scan from 02/07/16. Aortic atherosclerosis and mild coronary artery atherosclerosis was also noted. Pleural effusions were also noted.  In general, she did feel weakness and fatigue surrounding her chemotherapy/radiation however in March for instance she was feeling much better from a dyspnea standpoint. Really did not have any significant issues with that she states. Her shortness of breath when going up stairs is much improved, fairly relieved She denies any chest pain, syncope, bleeding, fevers, orthopnea, and explained weight loss, night sweats  On her echocardiogram originally in April, her ejection fraction was markedly reduced at 20-25%. She had been treated with Adriamycin. This also showed moderate pericardial effusion.  A subsequent follow-up echo 1 month later showed ejection fraction of 35%. Improved. Effusion was trace.  05/26/18 - last Sept BP was quite high. Scared. Trying different times of medication. All good since June. Saw Dr. Carollee Leitz. BP too low in AM with coreg AM. Stopped AM dose. MRI done - renal OK. No renal u/s needed. She showed me multiple blood pressure readings.  She was frustrated previously.  No chest pain.   Past Medical History:  Diagnosis Date  . Allergy   . Aortic regurgitation   . Arthritis    hands, neck  . Basal cell  carcinoma   . Benign neoplasm of colon   . Breast cancer of upper-outer quadrant of left female breast ([place]) 02/23/2015   4'16-left breast cancer-surgery, chemo, radiation-[name] follows every 3 months  . Cataract    beginning stages-monitoring by MD  . Cholelithiasis    resolved with surgery  . Chronic systolic CHF (congestive heart failure) ([place]) 02/26/2016   A.  Echo 02/21/16:  Mild LVH, EF 20-25%, diff HK, Gr 3 DD, mod AI, mod to severe MR, mod LAE, mod redcued RVSF, mild RAE, mod TR, PASP 53 mmHg, small pericardial effusion, mod L pleural effusion  //  B. Echo 6/17:  Mild LVH EF 30-35%, diffuse HK, grade 1 diastolic dysfunction, trivial AI, MAC, mild LAE, normal RVSF, trivial pericardial effusion    . Cystocele   . Diverticulitis   . Diverticulosis of colon (without mention of hemorrhage)   . Gout    resolved -  . Hearing loss    some hearing loss in right ear- no hearing aids  . Heart murmur   . Hematuria    negative urology work up  . History of blood transfusion 1971   x 1 with D&C -   . Hx of abnormal cervical Pap smear    LEEP-CIN I, negative margins and ECC  . Hypercalcemia   . Hypertension   . Mitral regurgitation   . Neuromuscular disorder ([place])    mild neuropathy in feet due to chemo.  . OA (osteoarthritis) of hip    right  . Other issue of medical certificates    uterine myomata  . Parathyroid abnormality ([place])  being followed Dr. Barbette Hair  . Radiation 10/01/15-11/16/15   left breast axillary and supraclavicular reigion 45 gray, left breast 50.4 gray, lumpectomy cavity boost 10 gray  . Renal disease 3/16   Stage 3 kidney disease-seeing Dr Buddy Duty and Dr Marval Regal  . Uterine prolaps    resolved after surgery  . Vitamin D deficiency     Past Surgical History:  Procedure Laterality Date  . BREAST LUMPECTOMY Left 08/2015  . CHOLECYSTECTOMY    . COLONOSCOPY  01/18/2013   [name]  . COLONOSCOPY N/A 06/26/2016   Procedure: COLONOSCOPY;  Surgeon: Irene Shipper, MD;   [facility];  Service: Endoscopy;  Laterality: N/A;  . CYSTOCELE REPAIR  08/2010   Rectocele, vault prolapse  . DILATION AND CURETTAGE OF UTERUS     x2  . PORT-A-CATH REMOVAL Right 08/23/2015   Procedure: REMOVAL PORT-A-CATH;  Surgeon: Erroll Luna, MD;  Location: [facility];  Service: General;  Laterality: Right;  . PORTACATH PLACEMENT Right 03/20/2015   Procedure: INSERTION PORT-A-CATH;  Surgeon: Erroll Luna, MD;  Location: [facility];  Service: General;  Laterality: Right;  . RADIOACTIVE SEED GUIDED PARTIAL MASTECTOMY WITH AXILLARY SENTINEL LYMPH NODE BIOPSY Left 08/23/2015   Procedure: LEFT BREAST PARTIAL MASTECTOMY WITH SENTINEL LYMPH NODE MAPPING;  Surgeon: Erroll Luna, MD;  Location: [facility];  Service: General;  Laterality: Left;  . TONSILLECTOMY AND ADENOIDECTOMY    . TOTAL VAGINAL HYSTERECTOMY     secondary to prolapse, ovaries not removed    Current Medications: Outpatient Medications Prior to Visit  Medication Sig Dispense Refill  . B Complex Vitamins (VITAMIN B COMPLEX PO) Take 1 tablet by mouth daily.     . carvedilol (COREG) 12.5 MG tablet Take 1 tablet (12.5 mg total) by mouth 2 (two) times daily. 60 tablet 6  . furosemide (LASIX) 20 MG tablet Take 1 tablet (20 mg total) by mouth daily. 30 tablet 6  . hyoscyamine (LEVSIN, ANASPAZ) 0.125 MG tablet TAKE 1 TABLET EVERY 4-6 HOURS AS NEEDED FOR ABDOMINAL PAIN 30 tablet 11  . lisinopril (PRINIVIL,ZESTRIL) 40 MG tablet Take 40 mg by mouth daily.     Marland Kitchen loratadine (CLARITIN) 10 MG tablet Take 10 mg by mouth daily.    Marland Kitchen spironolactone (ALDACTONE) 25 MG tablet Take 0.5 tablets (12.5 mg total) by mouth daily. TAKE ONE-HALF TABLET BY MOUTH DAILY. 30 tablet 5   No facility-administered medications prior to visit.      Allergies:   Patient has no known allergies.   Social History   Socioeconomic History  . Marital status: Widowed    Spouse name: Not on file  . Number of children: 4  .  Years of education: Not on file  . Highest education level: Not on file  Occupational History  . Occupation: retired  Scientific laboratory technician  . Financial resource strain: Not on file  . Food insecurity:    Worry: Not on file    Inability: Not on file  . Transportation needs:    Medical: Not on file    Non-medical: Not on file  Tobacco Use  . Smoking status: Never Smoker  . Smokeless tobacco: Never Used  Substance and Sexual Activity  . Alcohol use: Yes    Comment: occasionally  wine/beer  . Drug use: No  . Sexual activity: Never    Birth control/protection: Surgical    Comment: Vaginal hysterectomy  Lifestyle  . Physical activity:    Days per week: Not on file  Minutes per session: Not on file  . Stress: Not on file  Relationships  . Social connections:    Talks on phone: Not on file    Gets together: Not on file    Attends religious service: Not on file    Active member of club or organization: Not on file    Attends meetings of clubs or organizations: Not on file    Relationship status: Not on file  Other Topics Concern  . Not on file  Social History Narrative  . Not on file     Family History:  The patient's family history includes Alzheimer's disease in her mother; Breast cancer (age of onset: 9) in her sister; Cancer in her maternal uncle; Cancer (age of onset: 51) in her cousin and maternal uncle; Lung cancer in her mother; Lung cancer (age of onset: 73) in her father; Multiple sclerosis in her sister; Parkinson's disease in her mother; Prostate cancer (age of onset: 61) in her maternal uncle.   ROS:   Please see the history of present illness.    Review of Systems  All other systems reviewed and are negative.    PHYSICAL EXAM:   VS:  LMP 10/20/1996    GEN: Well nourished, well developed, in no acute distress  HEENT: normal  Neck: no JVD, carotid bruits, or masses Cardiac: RRR; no murmurs, rubs, or gallops,no edema  Respiratory:  clear to auscultation  bilaterally, normal work of breathing GI: soft, nontender, nondistended, + BS MS: no deformity or atrophy  Skin: warm and dry, no rash Neuro:  Alert and Oriented x 3, Strength and sensation are intact Psych: euthymic mood, full affect   Wt Readings from Last 3 Encounters:  02/19/18 165 lb (74.8 kg)  01/26/18 162 lb 6 oz (73.7 kg)  11/18/17 161 lb 1.6 oz (73.1 kg)      Studies/Labs Reviewed:   EKG:   EKG from today 3/18 shows sinus rhythm 64 with no other abnormalitis..  The ekg from 02/07/16 shows sinus rhythm, lower voltage, poor R-wave progression, no other significant abnormality.  Recent Labs: 11/18/2017: ALT 13; BUN 20; Creatinine 1.41; Hemoglobin 15.7; Platelet Count 309; Potassium 4.4; Sodium 140   Lipid Panel No results found for: CHOL, TRIG, HDL, CHOLHDL, VLDL, LDLCALC, LDLDIRECT  Additional studies/ records that were reviewed today include:  CT scan prior old notes, lab work, prior echocardiogram  ECHO 03/26/16 - Left ventricle: The cavity size was normal. Wall thickness was   increased in a pattern of mild LVH. Systolic function was   moderately to severely reduced. The estimated ejection fraction   was in the range of 30% to 35%. Diffuse hypokinesis. Doppler   parameters are consistent with abnormal left ventricular   relaxation (grade 1 diastolic dysfunction). - Aortic valve: There was no stenosis. There was trivial   regurgitation. - Mitral valve: Mildly calcified annulus. Normal thickness leaflets   . - Left atrium: The atrium was mildly dilated. - Right ventricle: The cavity size was normal. Systolic function   was normal. - Pulmonary arteries: No complete TR Doppler jet so unable to   estimate PA systolic pressure. - Inferior vena cava: The vessel was dilated. The respirophasic   diameter changes were blunted (< 50%), consistent with elevated   central venous pressure. - Pericardium, extracardiac: A trivial pericardial effusion was    identified.  Impressions:  - Normal LV size with mild LV hypertrophy. EF 30-35% with diffuse   hypokinesis. Normal RV size and systolic function.  No significant   valvular abnormalities.  ECHO 01/21/17 - Left ventricle: The cavity size was normal. Wall thickness was   normal. Systolic function was mildly reduced. The estimated   ejection fraction was in the range of 45% to 50%. Inferolateral   hypokinesis. Abnormal GLPSS at -14% wtih inferior strain   abnormality. Doppler parameters are consistent with abnormal left   ventricular relaxation (grade 1 diastolic dysfunction). The E/e&'   ratio is >15, suggesting elevated LV filling pressure. - Aortic valve: Trileaflet. Sclerosis without stenosis. There was   trivial regurgitation. - Left atrium: The atrium was normal in size. - Inferior vena cava: The vessel was normal in size. The   respirophasic diameter changes were in the normal range (>= 50%),   consistent with normal central venous pressure.  Impressions:  - Compared to a prior study in 2017, the LVEF has improved to   45-50% - there is inferior and inferolateral hypokinesis.   ASSESSMENT:    No diagnosis found.   PLAN:  In order of problems listed above:  Essential hypertension -She takes her blood pressure 3 times a day.  Most of the time in the mid day around noon it is been the best controlled.  Occasionally when she was taking her carvedilol in the morning it would get too low.  She is now only taking it in the p.m. hours, carvedilol that is.  I am actually fine with this since she is seeing dips too low in the mid day.  Also I told her that if her blood pressure were to get too low in the morning, 105 for instance systolic, she could hold her Lasix in that situation.  No major changes are needed at this time.  On average, her blood pressure is been under reasonable control.  No need for renal ultrasound since she has had a renal MRI which was normal.  Plan to her  that sometimes it is very challenging to take care of labile hypertension because we do not want to over treat especially when the blood pressure gets too low and she becomes symptomatic.  Pericardial effusion   - Seen on CT scan 02/07/16 moderate in size. This has resolved.  Aortic atherosclerosis, mild coronary atherosclerosis seen on CT  - Secondary prevention.  Chronic systolic heart failure  - Doing quite well, NYHA class I, no shortness of breath  - No angina, EF improved  - Likely nonischemic cardiomyopathy, likely from chemotherapy or perhaps viral mediated.  - I personally reviewed CT scan of chest, no obvious coronary calcium detected. EF prior to breast cancer was normal. This minimizes the likelihood of ischemic cardiomyopathy. Continue with current therapy.   Medication Adjustments/Labs and Tests Ordered: Current medicines are reviewed at length with the patient today.  Concerns regarding medicines are outlined above.  Medication changes, Labs and Tests ordered today are listed in the Patient Instructions below. There are no Patient Instructions on file for this visit.   Signed, Candee Furbish, MD  05/26/2018 10:57 AM    [facility] [address], [place], [place]  [address] Phone: [phone]; Fax: [phone]

## 2018-07-13 DIAGNOSIS — I429 Cardiomyopathy, unspecified: Secondary | ICD-10-CM | POA: Diagnosis not present

## 2018-07-13 DIAGNOSIS — J309 Allergic rhinitis, unspecified: Secondary | ICD-10-CM | POA: Diagnosis not present

## 2018-07-13 DIAGNOSIS — Z1389 Encounter for screening for other disorder: Secondary | ICD-10-CM | POA: Diagnosis not present

## 2018-07-13 DIAGNOSIS — I509 Heart failure, unspecified: Secondary | ICD-10-CM | POA: Diagnosis not present

## 2018-07-13 DIAGNOSIS — I129 Hypertensive chronic kidney disease with stage 1 through stage 4 chronic kidney disease, or unspecified chronic kidney disease: Secondary | ICD-10-CM | POA: Diagnosis not present

## 2018-07-13 DIAGNOSIS — N183 Chronic kidney disease, stage 3 (moderate): Secondary | ICD-10-CM | POA: Diagnosis not present

## 2018-07-28 DIAGNOSIS — E210 Primary hyperparathyroidism: Secondary | ICD-10-CM | POA: Diagnosis not present

## 2018-07-28 DIAGNOSIS — N183 Chronic kidney disease, stage 3 (moderate): Secondary | ICD-10-CM | POA: Diagnosis not present

## 2018-07-28 DIAGNOSIS — Z6829 Body mass index (BMI) 29.0-29.9, adult: Secondary | ICD-10-CM | POA: Diagnosis not present

## 2018-07-28 DIAGNOSIS — C50919 Malignant neoplasm of unspecified site of unspecified female breast: Secondary | ICD-10-CM | POA: Diagnosis not present

## 2018-07-28 DIAGNOSIS — I129 Hypertensive chronic kidney disease with stage 1 through stage 4 chronic kidney disease, or unspecified chronic kidney disease: Secondary | ICD-10-CM | POA: Diagnosis not present

## 2018-07-28 DIAGNOSIS — I429 Cardiomyopathy, unspecified: Secondary | ICD-10-CM | POA: Diagnosis not present

## 2018-07-28 DIAGNOSIS — I313 Pericardial effusion (noninflammatory): Secondary | ICD-10-CM | POA: Diagnosis not present

## 2018-07-28 DIAGNOSIS — Z23 Encounter for immunization: Secondary | ICD-10-CM | POA: Diagnosis not present

## 2018-07-28 DIAGNOSIS — N2581 Secondary hyperparathyroidism of renal origin: Secondary | ICD-10-CM | POA: Diagnosis not present

## 2018-11-16 NOTE — Progress Notes (Signed)
Patient Care Team: Donald Prose, MD as PCP - General (Family Medicine) Erroll Luna, MD as Consulting Physician (General Surgery) Nicholas Lose, MD as Consulting Physician (Hematology and Oncology) Gery Pray, MD as Consulting Physician (Radiation Oncology) Mauro Kaufmann, RN as Registered Nurse Rockwell Germany, RN as Registered Nurse Jake Shark Johny Blamer, NP as Nurse Practitioner (Hematology and Oncology)  DIAGNOSIS:    ICD-10-CM   1. Malignant neoplasm of upper-outer quadrant of left breast in female, estrogen receptor negative ([place]) C50.412    Z17.1   2. Chronic systolic CHF (congestive heart failure) (HCC) I50.22 carvedilol (COREG) 12.5 MG tablet  3. Dilated cardiomyopathy (HCC) I42.0 carvedilol (COREG) 12.5 MG tablet    SUMMARY OF ONCOLOGIC HISTORY:   Breast cancer of upper-outer quadrant of left female breast ([place])   02/15/2015 Mammogram    Left breast increased density, mass is irregular with microcalcifications measuring 2.9 cm, cyst in the breast as well as abnormal enlarged lymph nodes    02/20/2015 Initial Diagnosis    Left breast biopsy: Invasive ductal carcinoma with DCIS, axillary lymph node biopsy positive, ER 0%, PR 0%, HER-2 negative ratio 1.13 with Ki-67 75%    03/08/2015 Procedure    OvaNext panel [name]) reveals no clinically significant variant at ATM, BARD1, BRCA1, BRCA2, BRIP1, CDH1, CHEK2, EPCAM, MLH1, MRE11A, MSH2, MSH6, MUTYH, NBN, NF1, PALB2, PMS2, PTEN, RAD50, RAD51C, RAD51D, SMARCA4, STK11, and TP53.     03/15/2015 PET scan    2.8 cm solid irregular left breast mass which is hypermetabolic and consistent with known left breast cancer. 2. Weakly positive axillary lymph nodes or equivocal    03/20/2015 Breast MRI    3.1 cm irregular enhancing mass located within the left breast at the 1 o'clock position; 1.6 cm Left axillary LN    03/20/2015 Clinical Stage    Stage IIB: T2 N1    03/22/2015 - 08/02/2015 Neo-Adjuvant Chemotherapy    Neo-adjuvant  chemotherapy with dose dense Adriamycin and Cytoxan 4 followed by Abraxane weekly 12    08/06/2015 Breast MRI    Left breast now measures 0.9 x 0.8 x 1.9 cm (previously measured 3.0 x 3.1 x 1.8 cm). Dec size of abnormal Left axill LN    08/23/2015 Surgery    Left lumpectomy: Invasive ductal carcinoma grade 3, 2.8 cm, associated extensive DCIS with comedonecrosis, margins negative,1/2 sentinel nodes positive, ER 0%, PR 0%, HER2/neu repeated and remains negative (ratio 1.13)    08/23/2015 Pathologic Stage    Stage IIB: ypT2 ypN1    10/01/2015 - 11/16/2015 Radiation Therapy    Adjuvant RT: Left breast axillary and supraclavicular region, 45 gray in 25 fractions, the left breast received 3 additional treatments for a cumulative dose of 50.4 gray. Lumpectomy cavity boost 10 gray in 5 fractions    12/27/2015 Survivorship    Survivorship visit completed and copy of care plan provided to patient     CHIEF COMPLIANT: Surveillance of triple negative breast cancer  INTERVAL HISTORY: Carla Bennett is a 84 y.o. with above-mentioned history of triple negative left breast cancer treated with neoadjuvant chemotherapy followed by lumpectomy and adjuvant radiation. I last saw the patient one year ago. She presents to the clinic today and is doing very well. She volunteers often, spends time with her large family, and takes senior computer classes. She denies any pain in her breasts. She notes digestive issues and heartburn. She is not interested in a clinical trial. She notes an itchy patch on her right lower back. She  walks about 2 miles every day. She reviewed her medication list with me.   REVIEW OF SYSTEMS:   Constitutional: Denies fevers, chills or abnormal weight loss Eyes: Denies blurriness of vision Ears, nose, mouth, throat, and face: Denies mucositis or sore throat Respiratory: Denies cough, dyspnea or wheezes Cardiovascular: Denies palpitation, chest discomfort Gastrointestinal: Denies nausea  or change in bowel habits (+) heartburn  Skin: (+) itchy patch on right lower back Lymphatics: Denies new lymphadenopathy or easy bruising Neurological: Denies numbness, tingling or new weaknesses Behavioral/Psych: Mood is stable, no new changes  Extremities: No lower extremity edema Breast: denies any pain or lumps or nodules in either breasts All other systems were reviewed with the patient and are negative.  I have reviewed the past medical history, past surgical history, social history and family history with the patient and they are unchanged from previous note.  ALLERGIES:  has No Known Allergies.  MEDICATIONS:  Current Outpatient Medications  Medication Sig Dispense Refill  . B Complex Vitamins (VITAMIN B COMPLEX PO) Take 1 tablet by mouth daily.     . calcium carbonate (TUMS) 500 MG chewable tablet Chew 2 tablets (400 mg of elemental calcium total) by mouth 3 times/day as needed-between meals & bedtime for indigestion or heartburn.    . carvedilol (COREG) 12.5 MG tablet Take 1 tablet (12.5 mg total) by mouth daily. 1/2 tab in AM and 1 tab in PM    . furosemide (LASIX) 20 MG tablet Take 1 tablet (20 mg total) by mouth daily. 30 tablet 6  . hyoscyamine (LEVSIN, ANASPAZ) 0.125 MG tablet TAKE 1 TABLET EVERY 4-6 HOURS AS NEEDED FOR ABDOMINAL PAIN 30 tablet 11  . lisinopril (PRINIVIL,ZESTRIL) 40 MG tablet Take 40 mg by mouth daily.     Marland Kitchen loratadine (CLARITIN) 10 MG tablet Take 10 mg by mouth daily.    Marland Kitchen omeprazole (PRILOSEC) 20 MG capsule Take 20 mg by mouth as needed.    Marland Kitchen spironolactone (ALDACTONE) 25 MG tablet Take 0.5 tablets (12.5 mg total) by mouth daily. 90 tablet 0   No current facility-administered medications for this visit.     PHYSICAL EXAMINATION: ECOG PERFORMANCE STATUS: 0 - Asymptomatic  Vitals:   11/17/18 1019  BP: (!) 150/72  Pulse: 83  Resp: 18  Temp: 97.8 F (36.6 C)  SpO2: 100%   Filed Weights   11/17/18 1019  Weight: 158 lb 12.8 oz (72 kg)     GENERAL: alert, no distress and comfortable SKIN: skin color, texture, turgor are normal, no rashes or significant lesions EYES: normal, Conjunctiva are pink and non-injected, sclera clear OROPHARYNX: no exudate, no erythema and lips, buccal mucosa, and tongue normal  NECK: supple, thyroid normal size, non-tender, without nodularity LYMPH: no palpable lymphadenopathy in the cervical, axillary or inguinal LUNGS: clear to auscultation and percussion with normal breathing effort HEART: regular rate & rhythm and no murmurs and no lower extremity edema ABDOMEN: abdomen soft, non-tender and normal bowel sounds MUSCULOSKELETAL: no cyanosis of digits and no clubbing  NEURO: alert & oriented x 3 with fluent speech, no focal motor/sensory deficits EXTREMITIES: No lower extremity edema BREAST: No palpable masses or nodules in either right or left breasts.  Scar tissue from prior surgery in the left breast around the nipple and areole a.  No palpable axillary supraclavicular or infraclavicular adenopathy no breast tenderness or nipple discharge. (exam performed in the presence of a chaperone)  LABORATORY DATA:  I have reviewed the data as listed CMP Latest Ref Rng &  Units 11/18/2017 04/02/2017 11/14/2016  Glucose 70 - 140 mg/dL 90 100(H) 103  BUN 7 - 26 mg/dL 20 17 20.4  Creatinine 0.60 - 1.10 mg/dL 1.41(H) 1.40(H) 1.3(H)  Sodium 136 - 145 mmol/L 140 136 139  Potassium 3.5 - 5.1 mmol/L 4.4 4.1 4.8  Chloride 98 - 109 mmol/L 103 104 -  CO2 22 - 29 mmol/L _0 Calcium 8.4 - 10.4 mg/dL 11.0(H) 10.5(H) 11.5(H)  Total Protein 6.4 - 8.3 g/dL 7.4 7.1 7.2  Total Bilirubin 0.2 - 1.2 mg/dL 0.7 0.8 1.25(H)  Alkaline Phos 40 - 150 U/L 83 72 96  AST 5 - 34 U/L _1 ALT 0 - 55 U/L _2 Lab Results  Component Value Date   WBC 9.0 11/18/2017   HGB 15.7 11/18/2017   HCT 46.9 (H) 11/18/2017   MCV 88.7 11/18/2017   PLT 309 11/18/2017   NEUTROABS 6.9 (H) 11/18/2017    ASSESSMENT & PLAN:   Breast cancer of upper-outer quadrant of left female breast Left breast invasive ductal carcinoma grade 3 ER 0% PR 0% HER-2 negative Ki-67 75%, 2.9 cm mass with microcalcifications plus abnormal lymph nodes biopsy-proven to be breast cancer T2 N1 M0 stage IIB clinical stage S/P Neoadjuvant Dose dense AC folll by Abraxane X 12  left lumpectomy 08/23/2015 : Invasive ductal carcinoma grade 3, 2.8 cm, associated extensive DCIS with comedonecrosis, margins negative,1/2 sentinel nodes positive, ER 0%, PR 0% T2 N1 M0 stage IIB Completed radiation therapy 11/16/2015  I offered the patient adjuvant Xeloda But she refused.  Hypercalcemia: Ultrasound of the neck had revealed a parathyroid adenoma. Back pain: Bone scan 11/24/16: Normal Fatigue related to prior chemotherapy: Much improved  Breast Cancer Surveillance: 1. Breast exam1/29/2020: Benign 2. Mammogram  July 2019 at [facility]: Benign 3.  Bone density 04/30/2017 at [facility]: T score -0.7: Normal  Return to clinic in1 yr   No orders of the defined types were placed in this encounter.  The patient has a good understanding of the overall plan. she agrees with it. she will call with any problems that may develop before the next visit here.  Nicholas Lose, MD 11/17/2018  Carla Bennett am acting as scribe for Dr. Nicholas Lose.  I have reviewed the above documentation for accuracy and completeness, and I agree with the above.

## 2018-11-17 ENCOUNTER — Inpatient Hospital Stay: Payer: Medicare Other | Attending: Hematology and Oncology | Admitting: Hematology and Oncology

## 2018-11-17 ENCOUNTER — Telehealth: Payer: Self-pay | Admitting: Hematology and Oncology

## 2018-11-17 ENCOUNTER — Other Ambulatory Visit: Payer: Self-pay | Admitting: Physician Assistant

## 2018-11-17 DIAGNOSIS — R12 Heartburn: Secondary | ICD-10-CM

## 2018-11-17 DIAGNOSIS — I420 Dilated cardiomyopathy: Secondary | ICD-10-CM | POA: Diagnosis not present

## 2018-11-17 DIAGNOSIS — C50412 Malignant neoplasm of upper-outer quadrant of left female breast: Secondary | ICD-10-CM

## 2018-11-17 DIAGNOSIS — I5022 Chronic systolic (congestive) heart failure: Secondary | ICD-10-CM | POA: Diagnosis not present

## 2018-11-17 DIAGNOSIS — Z923 Personal history of irradiation: Secondary | ICD-10-CM | POA: Diagnosis not present

## 2018-11-17 DIAGNOSIS — Z171 Estrogen receptor negative status [ER-]: Secondary | ICD-10-CM

## 2018-11-17 DIAGNOSIS — Z9221 Personal history of antineoplastic chemotherapy: Secondary | ICD-10-CM

## 2018-11-17 DIAGNOSIS — Z79899 Other long term (current) drug therapy: Secondary | ICD-10-CM

## 2018-11-17 DIAGNOSIS — Z853 Personal history of malignant neoplasm of breast: Secondary | ICD-10-CM

## 2018-11-17 MED ORDER — CARVEDILOL 12.5 MG PO TABS: 12.5000 mg | ORAL_TABLET | Freq: Every day | ORAL | Status: DC

## 2018-11-17 MED ORDER — CALCIUM CARBONATE ANTACID 500 MG PO CHEW: 2.0000 | CHEWABLE_TABLET | Freq: Two times a day (BID) | ORAL | Status: AC | PRN

## 2018-11-17 NOTE — Telephone Encounter (Signed)
Gave AVS and calendar °

## 2018-11-17 NOTE — Assessment & Plan Note (Signed)
Left breast invasive ductal carcinoma grade 3 ER 0% PR 0% HER-2 negative Ki-67 75%, 2.9 cm mass with microcalcifications plus abnormal lymph nodes biopsy-proven to be breast cancer T2 N1 M0 stage IIB clinical stage S/P Neoadjuvant Dose dense AC folll by Abraxane X 12  left lumpectomy 08/23/2015 : Invasive ductal carcinoma grade 3, 2.8 cm, associated extensive DCIS with comedonecrosis, margins negative,1/2 sentinel nodes positive, ER 0%, PR 0% T2 N1 M0 stage IIB Completed radiation therapy 11/16/2015  I offered the patient adjuvant Xeloda But she refused.  Hypercalcemia: Ultrasound of the neck had revealed a parathyroid adenoma. Back pain: Bone scan 11/24/16: Normal Fatigue related to prior chemotherapy: Much improved  Breast Cancer Surveillance: 1. Breast exam1/29/2020: Benign 2. Mammogram  July 2019 at [facility]: Benign 3.  Bone density 04/30/2017 at [facility]: T score -0.7: Normal  Return to clinic in1 yr

## 2018-11-23 ENCOUNTER — Ambulatory Visit: Payer: Medicare Other | Admitting: Cardiology

## 2018-11-24 DIAGNOSIS — I509 Heart failure, unspecified: Secondary | ICD-10-CM | POA: Diagnosis not present

## 2018-11-24 DIAGNOSIS — I129 Hypertensive chronic kidney disease with stage 1 through stage 4 chronic kidney disease, or unspecified chronic kidney disease: Secondary | ICD-10-CM | POA: Diagnosis not present

## 2018-11-24 DIAGNOSIS — N2581 Secondary hyperparathyroidism of renal origin: Secondary | ICD-10-CM | POA: Diagnosis not present

## 2018-11-24 DIAGNOSIS — N183 Chronic kidney disease, stage 3 (moderate): Secondary | ICD-10-CM | POA: Diagnosis not present

## 2018-11-29 DIAGNOSIS — H2513 Age-related nuclear cataract, bilateral: Secondary | ICD-10-CM | POA: Diagnosis not present

## 2018-12-08 DIAGNOSIS — R221 Localized swelling, mass and lump, neck: Secondary | ICD-10-CM | POA: Diagnosis not present

## 2018-12-08 DIAGNOSIS — L821 Other seborrheic keratosis: Secondary | ICD-10-CM | POA: Diagnosis not present

## 2018-12-08 DIAGNOSIS — L989 Disorder of the skin and subcutaneous tissue, unspecified: Secondary | ICD-10-CM | POA: Diagnosis not present

## 2018-12-08 DIAGNOSIS — E042 Nontoxic multinodular goiter: Secondary | ICD-10-CM | POA: Diagnosis not present

## 2018-12-13 ENCOUNTER — Other Ambulatory Visit: Payer: Self-pay | Admitting: Family Medicine

## 2018-12-13 DIAGNOSIS — R221 Localized swelling, mass and lump, neck: Secondary | ICD-10-CM

## 2018-12-16 ENCOUNTER — Ambulatory Visit
Admission: RE | Admit: 2018-12-16 | Discharge: 2018-12-16 | Disposition: A | Discharge status: Home / Self Care | Payer: Medicare Other | Source: Ambulatory Visit | Attending: Family Medicine | Admitting: Family Medicine

## 2018-12-16 DIAGNOSIS — R221 Localized swelling, mass and lump, neck: Secondary | ICD-10-CM | POA: Diagnosis not present

## 2018-12-27 DIAGNOSIS — L719 Rosacea, unspecified: Secondary | ICD-10-CM | POA: Diagnosis not present

## 2018-12-27 DIAGNOSIS — Z23 Encounter for immunization: Secondary | ICD-10-CM | POA: Diagnosis not present

## 2018-12-27 DIAGNOSIS — L821 Other seborrheic keratosis: Secondary | ICD-10-CM | POA: Diagnosis not present

## 2018-12-27 DIAGNOSIS — L570 Actinic keratosis: Secondary | ICD-10-CM | POA: Diagnosis not present

## 2019-02-04 ENCOUNTER — Other Ambulatory Visit: Payer: Self-pay

## 2019-02-04 ENCOUNTER — Telehealth (INDEPENDENT_AMBULATORY_CARE_PROVIDER_SITE_OTHER): Payer: Medicare Other | Admitting: Cardiology

## 2019-02-04 ENCOUNTER — Encounter: Payer: Self-pay | Admitting: Cardiology

## 2019-02-04 VITALS — BP 143/74 | HR 69 | Ht 61.0 in | Wt 158.0 lb

## 2019-02-04 DIAGNOSIS — I5022 Chronic systolic (congestive) heart failure: Secondary | ICD-10-CM | POA: Diagnosis not present

## 2019-02-04 DIAGNOSIS — I700 Atherosclerosis of aorta: Secondary | ICD-10-CM

## 2019-02-04 DIAGNOSIS — Z79899 Other long term (current) drug therapy: Secondary | ICD-10-CM

## 2019-02-04 DIAGNOSIS — I10 Essential (primary) hypertension: Secondary | ICD-10-CM

## 2019-02-04 DIAGNOSIS — R0989 Other specified symptoms and signs involving the circulatory and respiratory systems: Secondary | ICD-10-CM

## 2019-02-04 DIAGNOSIS — I420 Dilated cardiomyopathy: Secondary | ICD-10-CM

## 2019-02-04 NOTE — Patient Instructions (Signed)
Medication Instructions:  The current medical regimen is effective;  continue present plan and medications.  If you need a refill on your cardiac medications before your next appointment, please call your pharmacy.   Lab work: Please have blood work in 34 month.  (BMP)  If you have labs (blood work) drawn today and your tests are completely normal, you will receive your results only by: Marland Kitchen MyChart Message (if you have MyChart) OR . A paper copy in the mail If you have any lab test that is abnormal or we need to change your treatment, we will call you to review the results.  Follow-Up: Follow up in 6 months with Dr. Marlou Porch.  You will receive a letter in the mail 2 months before you are due.  Please call us when you receive this letter to schedule your follow up appointment.  Thank you for choosing [facility]!!

## 2019-02-04 NOTE — Progress Notes (Signed)
Virtual Visit via Video Note   This visit type was conducted due to national recommendations for restrictions regarding the COVID-19 Pandemic (e.g. social distancing) in an effort to limit this patient's exposure and mitigate transmission in our community.  Due to her co-morbid illnesses, this patient is at least at moderate risk for complications without adequate follow up.  This format is felt to be most appropriate for this patient at this time.  All issues noted in this document were discussed and addressed.  A limited physical exam was performed with this format.  Please refer to the patient's chart for her consent to telehealth for [facility].   Evaluation Performed:  Follow-up visit  Date:  02/04/2019   ID:  Carla Bennett, DOB [birth year], MRN [id number]  Patient Location: Home Provider Location: Home  PCP:  Donald Prose, MD  Cardiologist:  Candee Furbish, MD  Electrophysiologist:  None   Chief Complaint: Follow-up cardiomyopathy and labile blood pressure  History of Present Illness:    Carla Bennett is a 84 y.o. female with follow-up of systolic heart failure, cardiomyopathy, previous chemotherapy for breast cancer, pericardial effusion.  She had an echo performed on 03/13/15 which at that time showed no evidence of pericardial effusion. Mild mitral and aortic regurgitation noted. Normal ejection fraction. This was pre-chemotherapy to breast cancer.  Moderate-sized pericardial effusion was personally viewed on CT scan from 02/07/16. Aortic atherosclerosis and mild coronary artery atherosclerosis was also noted. Pleural effusions were also noted.  In general, she did feel weakness and fatigue surrounding her chemotherapy/radiation however in March for instance she was feeling much better from a dyspnea standpoint. Really did not have any significant issues with that she states. Her shortness of breath when going up stairs is much improved, fairly relieved She denies any  chest pain, syncope, bleeding, fevers, orthopnea, and explained weight loss, night sweats  On her echocardiogram originally in April, her ejection fraction was markedly reduced at 20-25%. She had been treated with Adriamycin. This also showed moderate pericardial effusion.  A subsequent follow-up echo 1 month later showed ejection fraction of 35%. Improved. Effusion was trace.  05/26/18 - last Sept BP was quite high. Scared. Trying different times of medication. All good since June. Saw Dr. Carollee Leitz. BP too low in AM with coreg AM. Stopped AM dose. MRI done - renal OK. No renal u/s needed. She showed me multiple blood pressure readings.  She was frustrated previously.  No chest pain.  4/70/20  - showed me some BP readings. Could be dehydrated. Drinks 48 oz water a day. BP mid day goes down.  She discussed this with an anesthesiologist friend of hers who suggested drinking 16 ounce of fluid in the morning.  This seemed to help her out and help stabilize her blood pressures.  Overall they are slightly above normal but I am pleased.  No symptoms otherwise, no fevers chills nausea vomiting syncope shortness of breath.  No side effects from medications.  She has not had blood work recently.  We will set up.   The patient does not have symptoms concerning for COVID-19 infection (fever, chills, cough, or new shortness of breath).    Past Medical History:  Diagnosis Date  . Allergy   . Aortic regurgitation   . Arthritis    hands, neck  . Basal cell carcinoma   . Benign neoplasm of colon   . Breast cancer of upper-outer quadrant of left female breast ([place]) 02/23/2015   4'16-left breast cancer-surgery,  chemo, radiation-[name] follows every 3 months  . Cataract    beginning stages-monitoring by MD  . Cholelithiasis    resolved with surgery  . Chronic systolic CHF (congestive heart failure) ([place]) 02/26/2016   A.  Echo 02/21/16:  Mild LVH, EF 20-25%, diff HK, Gr 3 DD, mod AI, mod to severe MR, mod  LAE, mod redcued RVSF, mild RAE, mod TR, PASP 53 mmHg, small pericardial effusion, mod L pleural effusion  //  B. Echo 6/17:  Mild LVH EF 30-35%, diffuse HK, grade 1 diastolic dysfunction, trivial AI, MAC, mild LAE, normal RVSF, trivial pericardial effusion    . Cystocele   . Diverticulitis   . Diverticulosis of colon (without mention of hemorrhage)   . Gout    resolved -  . Hearing loss    some hearing loss in right ear- no hearing aids  . Heart murmur   . Hematuria    negative urology work up  . History of blood transfusion 1971   x 1 with D&C -   . Hx of abnormal cervical Pap smear    LEEP-CIN I, negative margins and ECC  . Hypercalcemia   . Hypertension   . Mitral regurgitation   . 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  . OA (osteoarthritis) of hip    right  . Other issue of medical certificates    uterine myomata  . Parathyroid abnormality (HCC)    being followed Dr. Barbette Hair  . Radiation 10/01/15-11/16/15   left breast axillary and supraclavicular reigion 45 gray, left breast 50.4 gray, lumpectomy cavity boost 10 gray  . Renal disease 3/16   Stage 3 kidney disease-seeing Dr Buddy Duty and Dr Marval Regal  . Uterine prolaps    resolved after surgery  . Vitamin D deficiency    Past Surgical History:  Procedure Laterality Date  . BREAST LUMPECTOMY Left 08/2015  . CHOLECYSTECTOMY    . COLONOSCOPY  01/18/2013   [name]  . COLONOSCOPY N/A 06/26/2016   Procedure: COLONOSCOPY;  Surgeon: Irene Shipper, MD;  Location: [facility];  Service: Endoscopy;  Laterality: N/A;  . CYSTOCELE REPAIR  08/2010   Rectocele, vault prolapse  . DILATION AND CURETTAGE OF UTERUS     x2  . PORT-A-CATH REMOVAL Right 08/23/2015   Procedure: REMOVAL PORT-A-CATH;  Surgeon: Erroll Luna, MD;  Location: [place];  Service: General;  Laterality: Right;  . PORTACATH PLACEMENT Right 03/20/2015   Procedure: INSERTION PORT-A-CATH;  Surgeon: Erroll Luna, MD;   Location: [facility];  Service: General;  Laterality: Right;  . RADIOACTIVE SEED GUIDED PARTIAL MASTECTOMY WITH AXILLARY SENTINEL LYMPH NODE BIOPSY Left 08/23/2015   Procedure: LEFT BREAST PARTIAL MASTECTOMY WITH SENTINEL LYMPH NODE MAPPING;  Surgeon: Erroll Luna, MD;  Location: [place];  Service: General;  Laterality: Left;  . TONSILLECTOMY AND ADENOIDECTOMY    . TOTAL VAGINAL HYSTERECTOMY     secondary to prolapse, ovaries not removed     Current Meds  Medication Sig  . B Complex Vitamins (VITAMIN B COMPLEX PO) Take 1 tablet by mouth daily.   . calcium carbonate (TUMS) 500 MG chewable tablet Chew 2 tablets (400 mg of elemental calcium total) by mouth 3 times/day as needed-between meals & bedtime for indigestion or heartburn.  . carvedilol (COREG) 12.5 MG tablet Take 1 tablet (12.5 mg total) by mouth daily. 1/2 tab in AM and 1 tab in PM  . furosemide (LASIX) 20 MG tablet Take 1  tablet (20 mg total) by mouth daily.  . hyoscyamine (LEVSIN, ANASPAZ) 0.125 MG tablet TAKE 1 TABLET EVERY 4-6 HOURS AS NEEDED FOR ABDOMINAL PAIN  . lisinopril (PRINIVIL,ZESTRIL) 40 MG tablet Take 40 mg by mouth daily.   Marland Kitchen spironolactone (ALDACTONE) 25 MG tablet Take 0.5 tablets (12.5 mg total) by mouth daily.     Allergies:   Patient has no known allergies.   Social History   Tobacco Use  . Smoking status: Never Smoker  . Smokeless tobacco: Never Used  Substance Use Topics  . Alcohol use: Yes    Comment: occasionally  wine/beer  . Drug use: No     Family Hx: The patient's family history includes Alzheimer's disease in her mother; Breast cancer (age of onset: 67) in her sister; Cancer in her maternal uncle; Cancer (age of onset: 54) in her cousin and maternal uncle; Lung cancer in her mother; Lung cancer (age of onset: 76) in her father; Multiple sclerosis in her sister; Parkinson's disease in her mother; Prostate cancer (age of onset: 60) in her maternal uncle. There is no history of Colon  cancer, Esophageal cancer, Rectal cancer, Stomach cancer, or Colon polyps.  ROS:   Please see the history of present illness.    Denies any fevers chills nausea vomiting syncope chest pain All other systems reviewed and are negative.   Prior CV studies:   The following studies were reviewed today:  Most recent echocardiogram with EF of 45 to 50%, improved.  Labs/Other Tests and Data Reviewed:    EKG:  An ECG dated 02/19/18 was personally reviewed today and demonstrated:  NSR, low voltage  Recent Labs: No results found for requested labs within last 8760 hours.   Recent Lipid Panel No results found for: CHOL, TRIG, HDL, CHOLHDL, LDLCALC, LDLDIRECT  Wt Readings from Last 3 Encounters:  02/04/19 158 lb (71.7 kg)  11/17/18 158 lb 12.8 oz (72 kg)  05/26/18 161 lb 9.6 oz (73.3 kg)     Objective:    Vital Signs:  BP (!) 143/74   Pulse 69   Ht _0  (1.549 m)   Wt 158 lb (71.7 kg)   LMP 10/20/1996   BMI 29.85 kg/m    VITAL SIGNS:  reviewed GEN:  no acute distress EYES:  sclerae anicteric, EOMI - Extraocular Movements Intact RESPIRATORY:  normal respiratory effort, symmetric expansion CARDIOVASCULAR:  no peripheral edema SKIN:  no rash, lesions or ulcers. MUSCULOSKELETAL:  no obvious deformities. NEURO:  alert and oriented x 3, no obvious focal deficit PSYCH:  normal affect  ASSESSMENT & PLAN:    Systolic heart failure -Doing well, no significant symptoms of shortness of breath.  No angina, EF is improved to 45 to 50%. -Likely nonischemic cardiomyopathy from chemo or viral mediated. -No obvious coronary calcium on CT scan freely.  EF prior to breast cancer was normal. -Continue with current therapy.  Labile blood pressure - Dr. Simonne Martinet has been seeing her as well. - Blood pressures in midday or improved after she is drinking about 16 ounce of liquids after getting up from bed.  Her anesthesiologist friend helped her with this suggestion. -We will check a basic  metabolic profile in 1 month, hopefully when it is safer for her to come out, coronavirus  Aortic atherosclerosis -Continue with secondary prevention efforts.  History of pericardial effusion -Resolved.   COVID-19 Education: The signs and symptoms of COVID-19 were discussed with the patient and how to seek care for testing (follow up with PCP  or arrange E-visit).  The importance of social distancing was discussed today.  Time:   Today, I have spent 16 minutes with the patient with telehealth technology discussing the above problems.     Medication Adjustments/Labs and Tests Ordered: Current medicines are reviewed at length with the patient today.  Concerns regarding medicines are outlined above.   Tests Ordered: Orders Placed This Encounter  Procedures  . Basic metabolic panel    Medication Changes: No orders of the defined types were placed in this encounter.   Disposition:  Follow up in 6 month(s)  Signed, Candee Furbish, MD  02/04/2019 4:14 PM    [facility]

## 2019-03-08 ENCOUNTER — Other Ambulatory Visit: Payer: Medicare Other

## 2019-03-08 ENCOUNTER — Other Ambulatory Visit: Payer: Self-pay

## 2019-03-08 DIAGNOSIS — Z79899 Other long term (current) drug therapy: Secondary | ICD-10-CM

## 2019-03-08 DIAGNOSIS — I10 Essential (primary) hypertension: Secondary | ICD-10-CM

## 2019-03-09 LAB — BASIC METABOLIC PANEL
BUN/Creatinine Ratio: 13 (ref 12–28)
BUN: 16 mg/dL (ref 8–27)
CO2: 23 mmol/L (ref 20–29)
Calcium: 10.6 mg/dL — ABNORMAL HIGH (ref 8.7–10.3)
Chloride: 100 mmol/L (ref 96–106)
Creatinine, Ser: 1.22 mg/dL — ABNORMAL HIGH (ref 0.57–1.00)
GFR calc Af Amer: 48 mL/min/{1.73_m2} — ABNORMAL LOW (ref >59)
GFR calc non Af Amer: 42 mL/min/{1.73_m2} — ABNORMAL LOW (ref >59)
Glucose: 80 mg/dL (ref 65–99)
Potassium: 4.9 mmol/L (ref 3.5–5.2)
Sodium: 139 mmol/L (ref 134–144)

## 2019-03-22 DIAGNOSIS — D225 Melanocytic nevi of trunk: Secondary | ICD-10-CM | POA: Diagnosis not present

## 2019-03-22 DIAGNOSIS — L821 Other seborrheic keratosis: Secondary | ICD-10-CM | POA: Diagnosis not present

## 2019-03-22 DIAGNOSIS — L719 Rosacea, unspecified: Secondary | ICD-10-CM | POA: Diagnosis not present

## 2019-03-22 DIAGNOSIS — Z85828 Personal history of other malignant neoplasm of skin: Secondary | ICD-10-CM | POA: Diagnosis not present

## 2019-05-03 ENCOUNTER — Telehealth: Payer: Self-pay | Admitting: Internal Medicine

## 2019-05-03 DIAGNOSIS — E042 Nontoxic multinodular goiter: Secondary | ICD-10-CM | POA: Diagnosis not present

## 2019-05-03 DIAGNOSIS — E210 Primary hyperparathyroidism: Secondary | ICD-10-CM | POA: Diagnosis not present

## 2019-05-03 MED ORDER — HYOSCYAMINE SULFATE 0.125 MG PO TABS: ORAL_TABLET | ORAL | 3 refills | Status: DC

## 2019-05-03 NOTE — Telephone Encounter (Signed)
Patient called for refill

## 2019-05-03 NOTE — Telephone Encounter (Signed)
Refilled Levsin

## 2019-05-10 DIAGNOSIS — Z853 Personal history of malignant neoplasm of breast: Secondary | ICD-10-CM | POA: Diagnosis not present

## 2019-06-16 DIAGNOSIS — L570 Actinic keratosis: Secondary | ICD-10-CM | POA: Diagnosis not present

## 2019-06-16 DIAGNOSIS — D280 Benign neoplasm of vulva: Secondary | ICD-10-CM | POA: Diagnosis not present

## 2019-06-17 DIAGNOSIS — N183 Chronic kidney disease, stage 3 (moderate): Secondary | ICD-10-CM | POA: Diagnosis not present

## 2019-06-17 DIAGNOSIS — N2581 Secondary hyperparathyroidism of renal origin: Secondary | ICD-10-CM | POA: Diagnosis not present

## 2019-06-17 DIAGNOSIS — I129 Hypertensive chronic kidney disease with stage 1 through stage 4 chronic kidney disease, or unspecified chronic kidney disease: Secondary | ICD-10-CM | POA: Diagnosis not present

## 2019-06-17 DIAGNOSIS — I5020 Unspecified systolic (congestive) heart failure: Secondary | ICD-10-CM | POA: Diagnosis not present

## 2019-06-29 DIAGNOSIS — Z1389 Encounter for screening for other disorder: Secondary | ICD-10-CM | POA: Diagnosis not present

## 2019-06-29 DIAGNOSIS — I129 Hypertensive chronic kidney disease with stage 1 through stage 4 chronic kidney disease, or unspecified chronic kidney disease: Secondary | ICD-10-CM | POA: Diagnosis not present

## 2019-06-29 DIAGNOSIS — I700 Atherosclerosis of aorta: Secondary | ICD-10-CM | POA: Diagnosis not present

## 2019-06-29 DIAGNOSIS — I5022 Chronic systolic (congestive) heart failure: Secondary | ICD-10-CM | POA: Diagnosis not present

## 2019-06-29 DIAGNOSIS — Z0000 Encounter for general adult medical examination without abnormal findings: Secondary | ICD-10-CM | POA: Diagnosis not present

## 2019-06-29 DIAGNOSIS — N183 Chronic kidney disease, stage 3 (moderate): Secondary | ICD-10-CM | POA: Diagnosis not present

## 2019-07-12 DIAGNOSIS — E213 Hyperparathyroidism, unspecified: Secondary | ICD-10-CM | POA: Diagnosis not present

## 2019-07-12 DIAGNOSIS — R29890 Loss of height: Secondary | ICD-10-CM | POA: Diagnosis not present

## 2019-07-12 DIAGNOSIS — Z853 Personal history of malignant neoplasm of breast: Secondary | ICD-10-CM | POA: Diagnosis not present

## 2019-07-12 DIAGNOSIS — N289 Disorder of kidney and ureter, unspecified: Secondary | ICD-10-CM | POA: Diagnosis not present

## 2019-07-12 DIAGNOSIS — Z8262 Family history of osteoporosis: Secondary | ICD-10-CM | POA: Diagnosis not present

## 2019-07-12 DIAGNOSIS — Z90710 Acquired absence of both cervix and uterus: Secondary | ICD-10-CM | POA: Diagnosis not present

## 2019-07-13 DIAGNOSIS — M25569 Pain in unspecified knee: Secondary | ICD-10-CM | POA: Diagnosis not present

## 2019-07-13 DIAGNOSIS — R109 Unspecified abdominal pain: Secondary | ICD-10-CM | POA: Diagnosis not present

## 2019-07-13 DIAGNOSIS — Z23 Encounter for immunization: Secondary | ICD-10-CM | POA: Diagnosis not present

## 2019-07-25 ENCOUNTER — Other Ambulatory Visit: Payer: Self-pay | Admitting: Physician Assistant

## 2019-07-25 DIAGNOSIS — A691 Other Vincent's infections: Secondary | ICD-10-CM | POA: Diagnosis not present

## 2019-07-25 DIAGNOSIS — I420 Dilated cardiomyopathy: Secondary | ICD-10-CM

## 2019-08-12 ENCOUNTER — Ambulatory Visit (INDEPENDENT_AMBULATORY_CARE_PROVIDER_SITE_OTHER): Payer: Medicare Other | Admitting: Cardiology

## 2019-08-12 ENCOUNTER — Encounter: Payer: Self-pay | Admitting: Cardiology

## 2019-08-12 ENCOUNTER — Other Ambulatory Visit: Payer: Self-pay

## 2019-08-12 VITALS — BP 140/76 | HR 64 | Ht 60.0 in | Wt 154.0 lb

## 2019-08-12 DIAGNOSIS — I5022 Chronic systolic (congestive) heart failure: Secondary | ICD-10-CM

## 2019-08-12 DIAGNOSIS — R0989 Other specified symptoms and signs involving the circulatory and respiratory systems: Secondary | ICD-10-CM | POA: Diagnosis not present

## 2019-08-12 DIAGNOSIS — I10 Essential (primary) hypertension: Secondary | ICD-10-CM

## 2019-08-12 DIAGNOSIS — I420 Dilated cardiomyopathy: Secondary | ICD-10-CM | POA: Diagnosis not present

## 2019-08-12 DIAGNOSIS — I700 Atherosclerosis of aorta: Secondary | ICD-10-CM | POA: Diagnosis not present

## 2019-08-12 NOTE — Patient Instructions (Signed)
Medication Instructions:  You may try splitting your Lisinopril in half and taking 20 mg twice a day.  Continue all other medications as listed.  *If you need a refill on your cardiac medications before your next appointment, please call your pharmacy*  Follow-Up: At [facility], you and your health needs are our priority.  As part of our continuing mission to provide you with exceptional heart care, we have created designated Provider Care Teams.  These Care Teams include your primary Cardiologist (physician) and Advanced Practice Providers (APPs -  Physician Assistants and Nurse Practitioners) who all work together to provide you with the care you need, when you need it.  Your next appointment:   6 months  The format for your next appointment:   In Person  Provider:   Kathyrn Drown, NP   Thank you for choosing [facility]!!

## 2019-08-12 NOTE — Progress Notes (Signed)
Cardiology Office Note    Date:  08/12/2019   ID:  Carla Bennett, DOB [birth year], MRN [id number]  PCP:  Donald Prose, MD  Cardiologist:   Candee Furbish, MD     History of Present Illness:  Carla Bennett is a 84 y.o. female here for follow-up of systolic heart failure, cardiomyopathy, previous chemotherapy for breast cancer, pericardial effusion.  She had an echo performed on 03/13/15 which at that time showed no evidence of pericardial effusion. Mild mitral and aortic regurgitation noted. Normal ejection fraction. This was pre-chemotherapy to breast cancer.  Moderate-sized pericardial effusion was personally viewed on CT scan from 02/07/16. Aortic atherosclerosis and mild coronary artery atherosclerosis was also noted. Pleural effusions were also noted.  In general, she did feel weakness and fatigue surrounding her chemotherapy/radiation however in March for instance she was feeling much better from a dyspnea standpoint. Really did not have any significant issues with that she states. Her shortness of breath when going up stairs is much improved, fairly relieved She denies any chest pain, syncope, bleeding, fevers, orthopnea, and explained weight loss, night sweats  On her echocardiogram originally in April, her ejection fraction was markedly reduced at 20-25%. She had been treated with Adriamycin. This also showed moderate pericardial effusion.  A subsequent follow-up echo 1 month later showed ejection fraction of 35%. Improved. Effusion was trace.  05/26/18 - last Sept BP was quite high. Scared. Trying different times of medication. All good since June. Saw Dr. Carollee Leitz. BP too low in AM with coreg AM. Stopped AM dose. MRI done - renal OK. No renal u/s needed. She showed me multiple blood pressure readings.  She was frustrated previously.  No chest pain.  03/47/4259-DGLO for systolic heart failure follow-up-doing very well no shortness of breath may be a little bit of tiredness  but overall feeling well.  No chest pain no syncope no bleeding.  Tolerating medications.  Usually in the mid day her blood pressures do run a little bit low when she feels slightly worse.  Asked her to try to split her lisinopril into half.   Past Medical History:  Diagnosis Date  . Allergy   . Aortic regurgitation   . Arthritis    hands, neck  . Basal cell carcinoma   . Benign neoplasm of colon   . Breast cancer of upper-outer quadrant of left female breast ([place]) 02/23/2015   4'16-left breast cancer-surgery, chemo, radiation-[name] follows every 3 months  . Cataract    beginning stages-monitoring by MD  . Cholelithiasis    resolved with surgery  . Chronic systolic CHF (congestive heart failure) ([place]) 02/26/2016   A.  Echo 02/21/16:  Mild LVH, EF 20-25%, diff HK, Gr 3 DD, mod AI, mod to severe MR, mod LAE, mod redcued RVSF, mild RAE, mod TR, PASP 53 mmHg, small pericardial effusion, mod L pleural effusion  //  B. Echo 6/17:  Mild LVH EF 30-35%, diffuse HK, grade 1 diastolic dysfunction, trivial AI, MAC, mild LAE, normal RVSF, trivial pericardial effusion    . Cystocele   . Diverticulitis   . Diverticulosis of colon (without mention of hemorrhage)   . Gout    resolved -  . Hearing loss    some hearing loss in right ear- no hearing aids  . Heart murmur   . Hematuria    negative urology work up  . History of blood transfusion 1971   x 1 with D&C -   . Hx of abnormal cervical Pap  smear    LEEP-CIN I, negative margins and ECC  . Hypercalcemia   . Hypertension   . Mitral regurgitation   . 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  . OA (osteoarthritis) of hip    right  . Other issue of medical certificates    uterine myomata  . Parathyroid abnormality (HCC)    being followed Dr. Barbette Hair  . Radiation 10/01/15-11/16/15   left breast axillary and supraclavicular reigion 45 gray, left breast 50.4 gray, lumpectomy cavity boost 10 gray  . Renal disease 3/16    Stage 3 kidney disease-seeing Dr Buddy Duty and Dr Marval Regal  . Uterine prolaps    resolved after surgery  . Vitamin D deficiency     Past Surgical History:  Procedure Laterality Date  . BREAST LUMPECTOMY Left 08/2015  . CHOLECYSTECTOMY    . COLONOSCOPY  01/18/2013   [name]  . COLONOSCOPY N/A 06/26/2016   Procedure: COLONOSCOPY;  Surgeon: Irene Shipper, MD;  Location: [facility];  Service: Endoscopy;  Laterality: N/A;  . CYSTOCELE REPAIR  08/2010   Rectocele, vault prolapse  . DILATION AND CURETTAGE OF UTERUS     x2  . PORT-A-CATH REMOVAL Right 08/23/2015   Procedure: REMOVAL PORT-A-CATH;  Surgeon: Erroll Luna, MD;  Location: [facility];  Service: General;  Laterality: Right;  . PORTACATH PLACEMENT Right 03/20/2015   Procedure: INSERTION PORT-A-CATH;  Surgeon: Erroll Luna, MD;  Location: [facility];  Service: General;  Laterality: Right;  . RADIOACTIVE SEED GUIDED PARTIAL MASTECTOMY WITH AXILLARY SENTINEL LYMPH NODE BIOPSY Left 08/23/2015   Procedure: LEFT BREAST PARTIAL MASTECTOMY WITH SENTINEL LYMPH NODE MAPPING;  Surgeon: Erroll Luna, MD;  Location: [facility];  Service: General;  Laterality: Left;  . TONSILLECTOMY AND ADENOIDECTOMY    . TOTAL VAGINAL HYSTERECTOMY     secondary to prolapse, ovaries not removed    Current Medications: Outpatient Medications Prior to Visit  Medication Sig Dispense Refill  . B Complex Vitamins (VITAMIN B COMPLEX PO) Take 1 tablet by mouth daily.     . calcium carbonate (TUMS) 500 MG chewable tablet Chew 2 tablets (400 mg of elemental calcium total) by mouth 3 times/day as needed-between meals & bedtime for indigestion or heartburn.    . carvedilol (COREG) 12.5 MG tablet Take 1 tablet (12.5 mg total) by mouth daily. 1/2 tab in AM and 1 tab in PM    . furosemide (LASIX) 20 MG tablet Take 1 tablet (20 mg total) by mouth daily. 30 tablet 6  . hyoscyamine (LEVSIN) 0.125 MG tablet TAKE 1 TABLET EVERY 4-6 HOURS AS NEEDED FOR  ABDOMINAL PAIN 30 tablet 3  . lisinopril (PRINIVIL,ZESTRIL) 40 MG tablet Take 40 mg by mouth daily.     Marland Kitchen loratadine (CLARITIN) 10 MG tablet Take 10 mg by mouth daily.    Marland Kitchen spironolactone (ALDACTONE) 25 MG tablet TAKE 1/2 TABLET BY MOUTH EVERY DAY 45 tablet 1   No facility-administered medications prior to visit.      Allergies:   Patient has no known allergies.   Social History   Socioeconomic History  . Marital status: Widowed    Spouse name: Not on file  . Number of children: 4  . Years of education: Not on file  . Highest education level: Not on file  Occupational History  . Occupation: retired  Scientific laboratory technician  . Financial resource strain: Not on file  . Food insecurity    Worry: Not on  file    Inability: Not on file  . Transportation needs    Medical: Not on file    Non-medical: Not on file  Tobacco Use  . Smoking status: Never Smoker  . Smokeless tobacco: Never Used  Substance and Sexual Activity  . Alcohol use: Yes    Comment: occasionally  wine/beer  . Drug use: No  . Sexual activity: Never    Birth control/protection: Surgical    Comment: Vaginal hysterectomy  Lifestyle  . Physical activity    Days per week: Not on file    Minutes per session: Not on file  . Stress: Not on file  Relationships  . Social Herbalist on phone: Not on file    Gets together: Not on file    Attends religious service: Not on file    Active member of club or organization: Not on file    Attends meetings of clubs or organizations: Not on file    Relationship status: Not on file  Other Topics Concern  . Not on file  Social History Narrative  . Not on file     Family History:  The patient's family history includes Alzheimer's disease in her mother; Breast cancer (age of onset: 60) in her sister; Cancer in her maternal uncle; Cancer (age of onset: 69) in her cousin and maternal uncle; Lung cancer in her mother; Lung cancer (age of onset: 21) in her father; Multiple  sclerosis in her sister; Parkinson's disease in her mother; Prostate cancer (age of onset: 60) in her maternal uncle.   ROS:   Please see the history of present illness.    Review of Systems  All other systems reviewed and are negative.    PHYSICAL EXAM:   VS:  BP 140/76   Pulse 64   Ht 5' (1.524 m)   Wt 154 lb (69.9 kg)   LMP 10/20/1996   BMI 30.08 kg/m    GEN: Well nourished, well developed, in no acute distress  HEENT: normal  Neck: no JVD, carotid bruits, or masses Cardiac: RRR; no murmurs, rubs, or gallops,no edema  Respiratory:  clear to auscultation bilaterally, normal work of breathing GI: soft, nontender, nondistended, + BS MS: no deformity or atrophy  Skin: warm and dry, no rash, spider veins right greater than left Neuro:  Alert and Oriented x 3, Strength and sensation are intact Psych: euthymic mood, full affect    Wt Readings from Last 3 Encounters:  08/12/19 154 lb (69.9 kg)  02/04/19 158 lb (71.7 kg)  11/17/18 158 lb 12.8 oz (72 kg)      Studies/Labs Reviewed:   EKG:   EKG from today 08/12/2019-sinus rhythm old inferior infarct pattern poor R wave progression otherwise normal.  3/18 shows sinus rhythm 64 with no other abnormalitis..  The ekg from 02/07/16 shows sinus rhythm, lower voltage, poor R-wave progression, no other significant abnormality.  Recent Labs: 03/08/2019: BUN 16; Creatinine, Ser 1.22; Potassium 4.9; Sodium 139   Lipid Panel No results found for: CHOL, TRIG, HDL, CHOLHDL, VLDL, LDLCALC, LDLDIRECT  Additional studies/ records that were reviewed today include:  CT scan prior old notes, lab work, prior echocardiogram  ECHO 03/26/16 - Left ventricle: The cavity size was normal. Wall thickness was   increased in a pattern of mild LVH. Systolic function was   moderately to severely reduced. The estimated ejection fraction   was in the range of 30% to 35%. Diffuse hypokinesis. Doppler   parameters are consistent with  abnormal left  ventricular   relaxation (grade 1 diastolic dysfunction). - Aortic valve: There was no stenosis. There was trivial   regurgitation. - Mitral valve: Mildly calcified annulus. Normal thickness leaflets   . - Left atrium: The atrium was mildly dilated. - Right ventricle: The cavity size was normal. Systolic function   was normal. - Pulmonary arteries: No complete TR Doppler jet so unable to   estimate PA systolic pressure. - Inferior vena cava: The vessel was dilated. The respirophasic   diameter changes were blunted (< 50%), consistent with elevated   central venous pressure. - Pericardium, extracardiac: A trivial pericardial effusion was   identified.  Impressions:  - Normal LV size with mild LV hypertrophy. EF 30-35% with diffuse   hypokinesis. Normal RV size and systolic function. No significant   valvular abnormalities.  ECHO 01/21/17 - Left ventricle: The cavity size was normal. Wall thickness was   normal. Systolic function was mildly reduced. The estimated   ejection fraction was in the range of 45% to 50%. Inferolateral   hypokinesis. Abnormal GLPSS at -14% wtih inferior strain   abnormality. Doppler parameters are consistent with abnormal left   ventricular relaxation (grade 1 diastolic dysfunction). The E/e&'   ratio is >15, suggesting elevated LV filling pressure. - Aortic valve: Trileaflet. Sclerosis without stenosis. There was   trivial regurgitation. - Left atrium: The atrium was normal in size. - Inferior vena cava: The vessel was normal in size. The   respirophasic diameter changes were in the normal range (>= 50%),   consistent with normal central venous pressure.  Impressions:  - Compared to a prior study in 2017, the LVEF has improved to   45-50% - there is inferior and inferolateral hypokinesis.   ASSESSMENT:    1. Dilated cardiomyopathy ([place])   2. Essential hypertension   3. Labile hypertension   4. Chronic systolic CHF (congestive heart  failure) ([place])   5. Aortic atherosclerosis (HCC)      PLAN:  In order of problems listed above:  Essential hypertension -She takes her blood pressure 3 times a day.  Most of the time in the mid day around noon it is been the best controlled.  Occasionally when she was taking her carvedilol in the morning it would get too low.  She is now only taking it in the p.m. hours, carvedilol that is.  I am actually fine with this since she is seeing dips too low in the mid day.  Also I told her that if her blood pressure were to get too low in the morning, 105 for instance systolic, she could hold her Lasix in that situation.  No major changes are needed at this time.  On average, her blood pressure is been under reasonable control.  No need for renal ultrasound since she has had a renal MRI which was normal.  Plan to her that sometimes it is very challenging to take care of labile hypertension because we do not want to over treat especially when the blood pressure gets too low and she becomes symptomatic.  She could also try cutting her lisinopril 40 mg in half and taking 20 a.m. 20 p.m.  This may help.  She takes her spironolactone in the evening.  Pericardial effusion   - Seen on CT scan 02/07/16 moderate in size. This has resolved.  No changes  Aortic atherosclerosis, mild coronary atherosclerosis seen on CT  - Secondary prevention.  No changes made.  Chronic systolic heart failure  -  Doing quite well, NYHA class I, no shortness of breath  - No angina, EF improved  - Likely nonischemic cardiomyopathy, likely from chemotherapy or perhaps viral mediated.  - I personally reviewed CT scan of chest, no obvious coronary calcium detected. EF prior to breast cancer was normal. This minimizes the likelihood of ischemic cardiomyopathy. Continue with current therapy.  Doing very well. EF has improved.   Medication Adjustments/Labs and Tests Ordered: Current medicines are reviewed at length with the patient  today.  Concerns regarding medicines are outlined above.  Medication changes, Labs and Tests ordered today are listed in the Patient Instructions below. Patient Instructions  Medication Instructions:  You may try splitting your Lisinopril in half and taking 20 mg twice a day.  Continue all other medications as listed.  *If you need a refill on your cardiac medications before your next appointment, please call your pharmacy*  Follow-Up: At [facility], you and your health needs are our priority.  As part of our continuing mission to provide you with exceptional heart care, we have created designated Provider Care Teams.  These Care Teams include your primary Cardiologist (physician) and Advanced Practice Providers (APPs -  Physician Assistants and Nurse Practitioners) who all work together to provide you with the care you need, when you need it.  Your next appointment:   6 months  The format for your next appointment:   In Person  Provider:   Kathyrn Drown, NP   Thank you for choosing [facility]!!         Signed, Candee Furbish, MD  08/12/2019 4:00 PM    [address], [place], [place]  [address] Phone: [phone]; Fax: [phone]

## 2019-09-01 DIAGNOSIS — Z20828 Contact with and (suspected) exposure to other viral communicable diseases: Secondary | ICD-10-CM | POA: Diagnosis not present

## 2019-09-27 DIAGNOSIS — D485 Neoplasm of uncertain behavior of skin: Secondary | ICD-10-CM | POA: Diagnosis not present

## 2019-09-27 DIAGNOSIS — Z23 Encounter for immunization: Secondary | ICD-10-CM | POA: Diagnosis not present

## 2019-10-06 DIAGNOSIS — I129 Hypertensive chronic kidney disease with stage 1 through stage 4 chronic kidney disease, or unspecified chronic kidney disease: Secondary | ICD-10-CM | POA: Diagnosis not present

## 2019-10-06 DIAGNOSIS — I5022 Chronic systolic (congestive) heart failure: Secondary | ICD-10-CM | POA: Diagnosis not present

## 2019-10-06 DIAGNOSIS — N183 Chronic kidney disease, stage 3 unspecified: Secondary | ICD-10-CM | POA: Diagnosis not present

## 2019-10-27 ENCOUNTER — Other Ambulatory Visit: Payer: Self-pay

## 2019-10-27 ENCOUNTER — Ambulatory Visit (INDEPENDENT_AMBULATORY_CARE_PROVIDER_SITE_OTHER): Payer: Medicare Other | Admitting: Plastic Surgery

## 2019-10-27 ENCOUNTER — Encounter: Payer: Self-pay | Admitting: Plastic Surgery

## 2019-10-27 VITALS — BP 155/92 | HR 80 | Temp 97.9°F | Ht 60.0 in | Wt 154.0 lb

## 2019-10-27 DIAGNOSIS — D229 Melanocytic nevi, unspecified: Secondary | ICD-10-CM

## 2019-10-27 NOTE — Progress Notes (Signed)
Referring Provider Donald Prose, MD [address] [place],  [place] [address]   CC: No chief complaint on file.     Carla Bennett is an 85 y.o. female.  HPI: Patient presents with a concerning lesion on her right labia majora.  This was biopsied with a shave biopsy by her dermatologist and appears to be a atypical nevus.  She is sent for excision with margins.  Additionally she come complains of a lesion that she is noted on her left labia that has a similar appearance to the 1 on the right.  Additionally on her left lower extremity she has a lesion that was previously frozen off but not biopsied that has recurred and she would like me to look at that as well.  No Known Allergies  Outpatient Encounter Medications as of 10/27/2019  Medication Sig  . B Complex Vitamins (VITAMIN B COMPLEX PO) Take 1 tablet by mouth daily.   . calcium carbonate (TUMS) 500 MG chewable tablet Chew 2 tablets (400 mg of elemental calcium total) by mouth 3 times/day as needed-between meals & bedtime for indigestion or heartburn.  . carvedilol (COREG) 12.5 MG tablet Take 1 tablet (12.5 mg total) by mouth daily. 1/2 tab in AM and 1 tab in PM (Patient taking differently: Take 12.5 mg by mouth 2 (two) times daily with a meal. 1/2 tab in AM and 1 tab in PM)  . furosemide (LASIX) 20 MG tablet Take 1 tablet (20 mg total) by mouth daily.  . hyoscyamine (LEVSIN) 0.125 MG tablet TAKE 1 TABLET EVERY 4-6 HOURS AS NEEDED FOR ABDOMINAL PAIN  . lisinopril (PRINIVIL,ZESTRIL) 40 MG tablet Take 40 mg by mouth daily.   Marland Kitchen loratadine (CLARITIN) 10 MG tablet Take 10 mg by mouth daily.  Marland Kitchen spironolactone (ALDACTONE) 25 MG tablet TAKE 1/2 TABLET BY MOUTH EVERY DAY   No facility-administered encounter medications on file as of 10/27/2019.     Past Medical History:  Diagnosis Date  . Allergy   . Aortic regurgitation   . Arthritis    hands, neck  . Basal cell carcinoma   . Benign neoplasm of colon   . Breast cancer of  upper-outer quadrant of left female breast ([place]) 02/23/2015   4'16-left breast cancer-surgery, chemo, radiation-[name] follows every 3 months  . Cataract    beginning stages-monitoring by MD  . Cholelithiasis    resolved with surgery  . Chronic systolic CHF (congestive heart failure) ([place]) 02/26/2016   A.  Echo 02/21/16:  Mild LVH, EF 20-25%, diff HK, Gr 3 DD, mod AI, mod to severe MR, mod LAE, mod redcued RVSF, mild RAE, mod TR, PASP 53 mmHg, small pericardial effusion, mod L pleural effusion  //  B. Echo 6/17:  Mild LVH EF 30-35%, diffuse HK, grade 1 diastolic dysfunction, trivial AI, MAC, mild LAE, normal RVSF, trivial pericardial effusion    . Cystocele   . Diverticulitis   . Diverticulosis of colon (without mention of hemorrhage)   . Gout    resolved -  . Hearing loss    some hearing loss in right ear- no hearing aids  . Heart murmur   . Hematuria    negative urology work up  . History of blood transfusion 1971   x 1 with D&C -   . Hx of abnormal cervical Pap smear    LEEP-CIN I, negative margins and ECC  . Hypercalcemia   . Hypertension   . Mitral regurgitation   . 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  . OA (osteoarthritis) of hip    right  . Other issue of medical certificates    uterine myomata  . Parathyroid abnormality (HCC)    being followed Dr. Barbette Hair  . Radiation 10/01/15-11/16/15   left breast axillary and supraclavicular reigion 45 gray, left breast 50.4 gray, lumpectomy cavity boost 10 gray  . Renal disease 3/16   Stage 3 kidney disease-seeing Dr Buddy Duty and Dr Marval Regal  . Uterine prolaps    resolved after surgery  . Vitamin D deficiency     Past Surgical History:  Procedure Laterality Date  . BREAST LUMPECTOMY Left 08/2015  . CHOLECYSTECTOMY    . COLONOSCOPY  01/18/2013   [name]  . COLONOSCOPY N/A 06/26/2016   Procedure: COLONOSCOPY;  Surgeon: Irene Shipper, MD;  [facility];  Service: Endoscopy;  Laterality: N/A;  .  CYSTOCELE REPAIR  08/2010   Rectocele, vault prolapse  . DILATION AND CURETTAGE OF UTERUS     x2  . PORT-A-CATH REMOVAL Right 08/23/2015   Procedure: REMOVAL PORT-A-CATH;  Surgeon: Erroll Luna, MD;  Location: [facility];  Service: General;  Laterality: Right;  . PORTACATH PLACEMENT Right 03/20/2015   Procedure: INSERTION PORT-A-CATH;  Surgeon: Erroll Luna, MD;  Location: [facility];  Service: General;  Laterality: Right;  . RADIOACTIVE SEED GUIDED PARTIAL MASTECTOMY WITH AXILLARY SENTINEL LYMPH NODE BIOPSY Left 08/23/2015   Procedure: LEFT BREAST PARTIAL MASTECTOMY WITH SENTINEL LYMPH NODE MAPPING;  Surgeon: Erroll Luna, MD;  Location: [facility];  Service: General;  Laterality: Left;  . TONSILLECTOMY AND ADENOIDECTOMY    . TOTAL VAGINAL HYSTERECTOMY     secondary to prolapse, ovaries not removed    Family History  Problem Relation Age of Onset  . Lung cancer Father 76  . Parkinson's disease Mother   . Alzheimer's disease Mother        ? diag  . Lung cancer Mother   . Breast cancer Sister 81  . Multiple sclerosis Sister   . Prostate cancer Maternal Uncle 63  . Cancer Maternal Uncle        stomache  . Cancer Maternal Uncle 58       GI cancer - ? stomach  . Cancer Cousin 38       mat female first cousin (son of mat uncle with GI cancer) with ureter cancer  . Colon cancer Neg Hx   . Esophageal cancer Neg Hx   . Rectal cancer Neg Hx   . Stomach cancer Neg Hx   . Colon polyps Neg Hx     Social History   Social History Narrative  . Not on file     Review of Systems General: Denies fevers, chills, weight loss CV: Denies chest pain, shortness of breath, palpitations  Physical Exam Vitals with BMI 10/27/2019 08/12/2019 02/04/2019  Height _0  _1  _2   Weight 154 lbs 154 lbs 158 lbs  BMI 30.08 70.96 28.36  Systolic 629 476 546  Diastolic 92 76 74  Pulse 80 64 69    General:  No acute distress,  Alert and oriented, Non-Toxic, Normal speech  and affect Examination of her right labia shows no obvious biopsy site as she is totally healed at this point.  She cannot quite identify where the lesion was.  On her left labia there is a lesion that is about 3 or 4 mm in diameter that is pigmented flat  with irregular borders.  On her left lower extremity there is an erythematous scaly lesion at about 4 mm in size in the area where she said she had a lesion frozen off previously.  Assessment/Plan Patient presents with a shave biopsy showing atypical nevus in the right labia majora.  I am planning to obtain color photographs of the lesion so that I can identify the location to excise.  Regarding the other lesions have offered excision of those as well which she is interested in.  Due to the recurrent nature of the 1 on her leg and the similar appearance on her left labia to the 1 on the right she wants to just go ahead and get them all taken care of.  We will plan to do this in the office under local.  I discussed the risk that include bleeding, infection, damage surrounding structures.  Cindra Presume 10/27/2019, 1:28 PM

## 2019-11-01 ENCOUNTER — Institutional Professional Consult (permissible substitution): Payer: Medicare Other | Admitting: Plastic Surgery

## 2019-11-10 ENCOUNTER — Telehealth: Payer: Self-pay | Admitting: Hematology and Oncology

## 2019-11-10 NOTE — Telephone Encounter (Signed)
Returned patient's phone call regarding rescheduling 01/29 appointment, per patient's request appointment has moved to 02/01.

## 2019-11-17 ENCOUNTER — Other Ambulatory Visit: Payer: Self-pay

## 2019-11-17 ENCOUNTER — Ambulatory Visit (INDEPENDENT_AMBULATORY_CARE_PROVIDER_SITE_OTHER): Payer: Medicare Other | Admitting: Plastic Surgery

## 2019-11-17 ENCOUNTER — Other Ambulatory Visit (HOSPITAL_COMMUNITY)
Admission: RE | Admit: 2019-11-17 | Discharge: 2019-11-17 | Disposition: A | Discharge status: Home / Self Care | Payer: Medicare Other | Source: Ambulatory Visit | Attending: Plastic Surgery | Admitting: Plastic Surgery

## 2019-11-17 ENCOUNTER — Encounter: Payer: Self-pay | Admitting: Plastic Surgery

## 2019-11-17 VITALS — BP 143/74 | HR 80 | Temp 97.9°F | Ht 60.0 in | Wt 154.0 lb

## 2019-11-17 DIAGNOSIS — L570 Actinic keratosis: Secondary | ICD-10-CM | POA: Diagnosis not present

## 2019-11-17 DIAGNOSIS — D485 Neoplasm of uncertain behavior of skin: Secondary | ICD-10-CM

## 2019-11-17 DIAGNOSIS — D229 Melanocytic nevi, unspecified: Secondary | ICD-10-CM | POA: Diagnosis not present

## 2019-11-17 DIAGNOSIS — D280 Benign neoplasm of vulva: Secondary | ICD-10-CM | POA: Diagnosis not present

## 2019-11-17 NOTE — Progress Notes (Signed)
Operative Note   DATE OF OPERATION: 11/17/2019  LOCATION:    SURGICAL DEPARTMENT: Plastic Surgery  PREOPERATIVE DIAGNOSES:  1.  Atypical nevus right labia majora 2.  Suspicious pigmented lesion left labia majora 3.  Suspicious lesion left lower extremity  POSTOPERATIVE DIAGNOSES:  same  PROCEDURE:  1. Excision of atypical nevus right labia majora measuring 3.5 cm 2. Excision suspicious pigmented lesion left labia majora measuring 3.5 cm 3. Excision of suspicious lesion left lower extremity measuring 3 cm 4. Complex closure right and left labia majora measuring 7 cm 5. Complex closure left lower extremity measuring 3 cm  SURGEON: Talmadge Coventry, MD  ANESTHESIA:  Local  COMPLICATIONS: None.   INDICATIONS FOR PROCEDURE:  The patient, Carla Bennett is a 85 y.o. female born on [birth year], is here for treatment of multiple skin lesions MRN: [id number]  CONSENT:  Informed consent was obtained directly from the patient. Risks, benefits and alternatives were fully discussed. Specific risks including but not limited to bleeding, infection, hematoma, seroma, scarring, pain, infection, wound healing problems, and need for further surgery were all discussed. The patient did have an ample opportunity to have questions answered to satisfaction.   DESCRIPTION OF PROCEDURE:  Local anesthesia was administered. The patient's operative site was prepped and draped in a sterile fashion. A time out was performed and all information was confirmed to be correct.  The lesions were excised with a 15 blade.  Hemostasis was obtained.  Circumferential undermining was performed and the skin was advanced and closed in layers with interrupted buried 4-0 Vicryl sutures and 4-0 Vicryl for the skin on the labia.  For the leg buried 4-0 Vicryl were used along with 4-0 nylon for the skin.    The patient tolerated the procedure well.  There were no complications.

## 2019-11-18 ENCOUNTER — Ambulatory Visit: Payer: Medicare Other | Admitting: Hematology and Oncology

## 2019-11-20 NOTE — Progress Notes (Signed)
Patient Care Team: Donald Prose, MD as PCP - General (Family Medicine) Jerline Pain, MD as PCP - Cardiology (Cardiology) Erroll Luna, MD as Consulting Physician (General Surgery) Nicholas Lose, MD as Consulting Physician (Hematology and Oncology) Gery Pray, MD as Consulting Physician (Radiation Oncology) Mauro Kaufmann, RN as Registered Nurse Rockwell Germany, RN as Registered Nurse Sylvan Cheese, NP as Nurse Practitioner (Hematology and Oncology)  DIAGNOSIS:    ICD-10-CM   1. Malignant neoplasm of upper-outer quadrant of left breast in female, estrogen receptor negative ([place])  C50.412    Z17.1     SUMMARY OF ONCOLOGIC HISTORY: Oncology History  Breast cancer of upper-outer quadrant of left female breast ([place])  02/15/2015 Mammogram   Left breast increased density, mass is irregular with microcalcifications measuring 2.9 cm, cyst in the breast as well as abnormal enlarged lymph nodes   02/20/2015 Initial Diagnosis   Left breast biopsy: Invasive ductal carcinoma with DCIS, axillary lymph node biopsy positive, ER 0%, PR 0%, HER-2 negative ratio 1.13 with Ki-67 75%   03/08/2015 Procedure   OvaNext panel [name]) reveals no clinically significant variant at ATM, BARD1, BRCA1, BRCA2, BRIP1, CDH1, CHEK2, EPCAM, MLH1, MRE11A, MSH2, MSH6, MUTYH, NBN, NF1, PALB2, PMS2, PTEN, RAD50, RAD51C, RAD51D, SMARCA4, STK11, and TP53.    03/15/2015 PET scan   2.8 cm solid irregular left breast mass which is hypermetabolic and consistent with known left breast cancer. 2. Weakly positive axillary lymph nodes or equivocal   03/20/2015 Breast MRI   3.1 cm irregular enhancing mass located within the left breast at the 1 o'clock position; 1.6 cm Left axillary LN   03/20/2015 Clinical Stage   Stage IIB: T2 N1   03/22/2015 - 08/02/2015 Neo-Adjuvant Chemotherapy   Neo-adjuvant chemotherapy with dose dense Adriamycin and Cytoxan 4 followed by Abraxane weekly 12   08/06/2015 Breast MRI   Left breast now measures 0.9 x 0.8 x 1.9 cm (previously measured 3.0 x 3.1 x 1.8 cm). Dec size of abnormal Left axill LN   08/23/2015 Surgery   Left lumpectomy: Invasive ductal carcinoma grade 3, 2.8 cm, associated extensive DCIS with comedonecrosis, margins negative,1/2 sentinel nodes positive, ER 0%, PR 0%, HER2/neu repeated and remains negative (ratio 1.13)   08/23/2015 Pathologic Stage   Stage IIB: ypT2 ypN1   10/01/2015 - 11/16/2015 Radiation Therapy   Adjuvant RT: Left breast axillary and supraclavicular region, 45 gray in 25 fractions, the left breast received 3 additional treatments for a cumulative dose of 50.4 gray. Lumpectomy cavity boost 10 gray in 5 fractions   12/27/2015 Survivorship   Survivorship visit completed and copy of care plan provided to patient     CHIEF COMPLIANT: Surveillance of triple negative breast cancer  INTERVAL HISTORY: Carla Bennett is a 85 y.o. with above-mentioned history of triple negative left breast cancer treated with neoadjuvant chemotherapy, lumpectomy, adjuvant radiation, and who is currently on surveillance. Mammogram on 05/10/19 showed no evidence of malignancy bilaterally. She presents to the clinic today for annual follow-up.  She had surgery on 3 moles 2 in her thighs and one on the lower extremity on the left leg.  She still has some sutures related to that.  ALLERGIES:  has No Known Allergies.  MEDICATIONS:  Current Outpatient Medications  Medication Sig Dispense Refill  . B Complex Vitamins (VITAMIN B COMPLEX PO) Take 1 tablet by mouth daily.     . calcium carbonate (TUMS) 500 MG chewable tablet Chew 2 tablets (400 mg of elemental calcium total)  by mouth 3 times/day as needed-between meals & bedtime for indigestion or heartburn.    . carvedilol (COREG) 12.5 MG tablet Take 1 tablet (12.5 mg total) by mouth daily. 1/2 tab in AM and 1 tab in PM (Patient taking differently: Take 12.5 mg by mouth. 1 tab in AM and 1 tab in PM)    . furosemide  (LASIX) 20 MG tablet Take 1 tablet (20 mg total) by mouth daily. 30 tablet 6  . hyoscyamine (LEVSIN) 0.125 MG tablet TAKE 1 TABLET EVERY 4-6 HOURS AS NEEDED FOR ABDOMINAL PAIN 30 tablet 3  . lisinopril (PRINIVIL,ZESTRIL) 40 MG tablet Take 40 mg by mouth daily.     Marland Kitchen loratadine (CLARITIN) 10 MG tablet Take 10 mg by mouth daily.    Marland Kitchen spironolactone (ALDACTONE) 25 MG tablet TAKE 1/2 TABLET BY MOUTH EVERY DAY 45 tablet 1   No current facility-administered medications for this visit.    PHYSICAL EXAMINATION: ECOG PERFORMANCE STATUS: 1 - Symptomatic but completely ambulatory  Vitals:   11/21/19 1118  BP: (!) 157/82  Pulse: 80  Resp: 20  Temp: 97.8 F (36.6 C)   Filed Weights   11/21/19 1118  Weight: 156 lb 12.8 oz (71.1 kg)    BREAST: No palpable masses or nodules in either right or left breasts. No palpable axillary supraclavicular or infraclavicular adenopathy no breast tenderness or nipple discharge. (exam performed in the presence of a chaperone)  LABORATORY DATA:  I have reviewed the data as listed CMP Latest Ref Rng & Units 03/08/2019 11/18/2017 04/02/2017  Glucose 65 - 99 mg/dL 80 90 100(H)  BUN 8 - 27 mg/dL '16 20 17  ' Creatinine 0.57 - 1.00 mg/dL 1.22(H) 1.41(H) 1.40(H)  Sodium 134 - 144 mmol/L 139 140 136  Potassium 3.5 - 5.2 mmol/L 4.9 4.4 4.1  Chloride 96 - 106 mmol/L 100 103 104  CO2 20 - 29 mmol/L '23 28 22  ' Calcium 8.7 - 10.3 mg/dL 10.6(H) 11.0(H) 10.5(H)  Total Protein 6.4 - 8.3 g/dL - 7.4 7.1  Total Bilirubin 0.2 - 1.2 mg/dL - 0.7 0.8  Alkaline Phos 40 - 150 U/L - 83 72  AST 5 - 34 U/L - 18 25  ALT 0 - 55 U/L - 13 18    Lab Results  Component Value Date   WBC 9.0 11/18/2017   HGB 15.7 11/18/2017   HCT 46.9 (H) 11/18/2017   MCV 88.7 11/18/2017   PLT 309 11/18/2017   NEUTROABS 6.9 (H) 11/18/2017    ASSESSMENT & PLAN:  Breast cancer of upper-outer quadrant of left female breast Left breast invasive ductal carcinoma grade 3 ER 0% PR 0% HER-2 negative Ki-67  75%, 2.9 cm mass with microcalcifications plus abnormal lymph nodes biopsy-proven to be breast cancer T2 N1 M0 stage IIB clinical stage S/P Neoadjuvant Dose dense AC folll by Abraxane X 12  left lumpectomy 08/23/2015 : Invasive ductal carcinoma grade 3, 2.8 cm, associated extensive DCIS with comedonecrosis, margins negative,1/2 sentinel nodes positive, ER 0%, PR 0% T2 N1 M0 stage IIB Completed radiation therapy 11/16/2015  I offered the patient adjuvant Xeloda But she refused.  Hypercalcemia: Ultrasound of the neck had revealed a parathyroid adenoma. Back pain:Bone scan2/5/18: Normal Fatigue related to prior chemotherapy:Much improved  Breast Cancer Surveillance: 1. Breast exam2/10/2019: Benign 2. Mammogram7/21/2020at [facility]: Benign 3.Bone density 04/30/2017 at [facility]: T score -0.7: Normal  Return to clinic in1 yr    No orders of the defined types were placed in this encounter.  The patient has  a good understanding of the overall plan. she agrees with it. she will call with any problems that may develop before the next visit here.  Total time spent: 20 mins including face to face time and time spent for planning, charting and coordination of care  Nicholas Lose, MD 11/21/2019  I, Cloyde Reams Dorshimer, am acting as scribe for Dr. Nicholas Lose.  I have reviewed the above documentation for accuracy and completeness, and I agree with the above.

## 2019-11-21 ENCOUNTER — Inpatient Hospital Stay: Payer: Medicare Other | Attending: Hematology and Oncology | Admitting: Hematology and Oncology

## 2019-11-21 ENCOUNTER — Other Ambulatory Visit: Payer: Self-pay

## 2019-11-21 DIAGNOSIS — C50412 Malignant neoplasm of upper-outer quadrant of left female breast: Secondary | ICD-10-CM

## 2019-11-21 DIAGNOSIS — Z923 Personal history of irradiation: Secondary | ICD-10-CM | POA: Insufficient documentation

## 2019-11-21 DIAGNOSIS — Z853 Personal history of malignant neoplasm of breast: Secondary | ICD-10-CM | POA: Diagnosis not present

## 2019-11-21 DIAGNOSIS — I420 Dilated cardiomyopathy: Secondary | ICD-10-CM

## 2019-11-21 DIAGNOSIS — I5022 Chronic systolic (congestive) heart failure: Secondary | ICD-10-CM | POA: Diagnosis not present

## 2019-11-21 DIAGNOSIS — D351 Benign neoplasm of parathyroid gland: Secondary | ICD-10-CM | POA: Insufficient documentation

## 2019-11-21 DIAGNOSIS — Z171 Estrogen receptor negative status [ER-]: Secondary | ICD-10-CM | POA: Diagnosis not present

## 2019-11-21 DIAGNOSIS — Z9221 Personal history of antineoplastic chemotherapy: Secondary | ICD-10-CM | POA: Insufficient documentation

## 2019-11-21 DIAGNOSIS — Z79899 Other long term (current) drug therapy: Secondary | ICD-10-CM | POA: Insufficient documentation

## 2019-11-21 MED ORDER — CARVEDILOL 12.5 MG PO TABS: 12.5000 mg | ORAL_TABLET | Freq: Two times a day (BID) | ORAL | Status: DC

## 2019-11-21 NOTE — Assessment & Plan Note (Signed)
Left breast invasive ductal carcinoma grade 3 ER 0% PR 0% HER-2 negative Ki-67 75%, 2.9 cm mass with microcalcifications plus abnormal lymph nodes biopsy-proven to be breast cancer T2 N1 M0 stage IIB clinical stage S/P Neoadjuvant Dose dense AC folll by Abraxane X 12  left lumpectomy 08/23/2015 : Invasive ductal carcinoma grade 3, 2.8 cm, associated extensive DCIS with comedonecrosis, margins negative,1/2 sentinel nodes positive, ER 0%, PR 0% T2 N1 M0 stage IIB Completed radiation therapy 11/16/2015  I offered the patient adjuvant Xeloda But she refused.  Hypercalcemia: Ultrasound of the neck had revealed a parathyroid adenoma. Back pain:Bone scan2/5/18: Normal Fatigue related to prior chemotherapy:Much improved  Breast Cancer Surveillance: 1. Breast exam2/10/2019: Benign 2. Mammogram7/21/2020at [facility]: Benign 3.[facility]30/2017 [facility]: T score -0.7: Normal  Return to clinic in1 yr

## 2019-11-22 ENCOUNTER — Telehealth: Payer: Self-pay | Admitting: Hematology and Oncology

## 2019-11-22 LAB — SURGICAL PATHOLOGY

## 2019-11-22 NOTE — Telephone Encounter (Signed)
I talk with patient regarding 11/20/2020

## 2019-11-23 ENCOUNTER — Telehealth: Payer: Self-pay

## 2019-11-23 NOTE — Telephone Encounter (Signed)
Call back to pt- she had questions regarding her pathology report- I consulted with Dr. Claudia Desanctis & per his evaluation-  Left leg specimen is benign- no atypia Left & right labial specimens have clear margins & no residual atypia- no further excision needed Pt reports that she is healing well- leg is minimally painful, but she has some burning & stinging- at the  bilateral labial incision sites with urination- but no difficulty urinating. No bleeding/drainage & denies fever or chills She understands the path reports & is reminded to call is she has any concerns She has a f/u with Dr. Claudia Desanctis on 12/01/19

## 2019-11-25 DIAGNOSIS — N2581 Secondary hyperparathyroidism of renal origin: Secondary | ICD-10-CM | POA: Diagnosis not present

## 2019-11-25 DIAGNOSIS — N1831 Chronic kidney disease, stage 3a: Secondary | ICD-10-CM | POA: Diagnosis not present

## 2019-11-25 DIAGNOSIS — I129 Hypertensive chronic kidney disease with stage 1 through stage 4 chronic kidney disease, or unspecified chronic kidney disease: Secondary | ICD-10-CM | POA: Diagnosis not present

## 2019-12-01 ENCOUNTER — Ambulatory Visit (INDEPENDENT_AMBULATORY_CARE_PROVIDER_SITE_OTHER): Payer: Medicare Other | Admitting: Plastic Surgery

## 2019-12-01 ENCOUNTER — Encounter: Payer: Self-pay | Admitting: Plastic Surgery

## 2019-12-01 ENCOUNTER — Other Ambulatory Visit: Payer: Self-pay

## 2019-12-01 VITALS — BP 160/77 | HR 89 | Temp 97.7°F | Ht 60.0 in | Wt 157.4 lb

## 2019-12-01 DIAGNOSIS — D229 Melanocytic nevi, unspecified: Secondary | ICD-10-CM

## 2019-12-01 NOTE — Progress Notes (Signed)
Post op from excision bilateral labia and left leg.  All path benign.  Incisions healing fine.  Sutures removed from leg.  Follow up as needed.

## 2019-12-06 ENCOUNTER — Emergency Department (HOSPITAL_COMMUNITY)
Admission: EM | Admit: 2019-12-06 | Discharge: 2019-12-06 | Disposition: A | Discharge status: Home / Self Care | Payer: Medicare Other | Attending: Emergency Medicine | Admitting: Emergency Medicine

## 2019-12-06 ENCOUNTER — Encounter (HOSPITAL_COMMUNITY): Payer: Self-pay

## 2019-12-06 ENCOUNTER — Emergency Department (HOSPITAL_COMMUNITY): Payer: Medicare Other

## 2019-12-06 ENCOUNTER — Other Ambulatory Visit: Payer: Self-pay

## 2019-12-06 DIAGNOSIS — R55 Syncope and collapse: Secondary | ICD-10-CM | POA: Insufficient documentation

## 2019-12-06 DIAGNOSIS — Z79899 Other long term (current) drug therapy: Secondary | ICD-10-CM | POA: Insufficient documentation

## 2019-12-06 DIAGNOSIS — N189 Chronic kidney disease, unspecified: Secondary | ICD-10-CM | POA: Diagnosis not present

## 2019-12-06 DIAGNOSIS — R531 Weakness: Secondary | ICD-10-CM | POA: Diagnosis not present

## 2019-12-06 DIAGNOSIS — I509 Heart failure, unspecified: Secondary | ICD-10-CM | POA: Insufficient documentation

## 2019-12-06 DIAGNOSIS — I130 Hypertensive heart and chronic kidney disease with heart failure and stage 1 through stage 4 chronic kidney disease, or unspecified chronic kidney disease: Secondary | ICD-10-CM | POA: Insufficient documentation

## 2019-12-06 DIAGNOSIS — R197 Diarrhea, unspecified: Secondary | ICD-10-CM | POA: Diagnosis not present

## 2019-12-06 DIAGNOSIS — R42 Dizziness and giddiness: Secondary | ICD-10-CM | POA: Diagnosis not present

## 2019-12-06 LAB — COMPREHENSIVE METABOLIC PANEL
ALT: 17 U/L (ref 0–44)
AST: 24 U/L (ref 15–41)
Albumin: 3.8 g/dL (ref 3.5–5.0)
Alkaline Phosphatase: 69 U/L (ref 38–126)
Anion gap: 11 (ref 5–15)
BUN: 14 mg/dL (ref 8–23)
CO2: 21 mmol/L — ABNORMAL LOW (ref 22–32)
Calcium: 10.2 mg/dL (ref 8.9–10.3)
Chloride: 107 mmol/L (ref 98–111)
Creatinine, Ser: 1.19 mg/dL — ABNORMAL HIGH (ref 0.44–1.00)
GFR calc Af Amer: 49 mL/min — ABNORMAL LOW (ref >60)
GFR calc non Af Amer: 42 mL/min — ABNORMAL LOW (ref >60)
Glucose, Bld: 103 mg/dL — ABNORMAL HIGH (ref 70–99)
Potassium: 4 mmol/L (ref 3.5–5.1)
Sodium: 139 mmol/L (ref 135–145)
Total Bilirubin: 1.5 mg/dL — ABNORMAL HIGH (ref 0.3–1.2)
Total Protein: 6.6 g/dL (ref 6.5–8.1)

## 2019-12-06 LAB — CBC WITH DIFFERENTIAL/PLATELET
Abs Immature Granulocytes: 0.07 10*3/uL (ref 0.00–0.07)
Basophils Absolute: 0.1 10*3/uL (ref 0.0–0.1)
Basophils Relative: 0 %
Eosinophils Absolute: 0.2 10*3/uL (ref 0.0–0.5)
Eosinophils Relative: 1 %
HCT: 47.2 % — ABNORMAL HIGH (ref 36.0–46.0)
Hemoglobin: 15.4 g/dL — ABNORMAL HIGH (ref 12.0–15.0)
Immature Granulocytes: 1 %
Lymphocytes Relative: 9 %
Lymphs Abs: 1.3 10*3/uL (ref 0.7–4.0)
MCH: 29.7 pg (ref 26.0–34.0)
MCHC: 32.6 g/dL (ref 30.0–36.0)
MCV: 91.1 fL (ref 80.0–100.0)
Monocytes Absolute: 0.7 10*3/uL (ref 0.1–1.0)
Monocytes Relative: 4 %
Neutro Abs: 13.1 10*3/uL — ABNORMAL HIGH (ref 1.7–7.7)
Neutrophils Relative %: 85 %
Platelets: 326 10*3/uL (ref 150–400)
RBC: 5.18 MIL/uL — ABNORMAL HIGH (ref 3.87–5.11)
RDW: 12.8 % (ref 11.5–15.5)
WBC: 15.4 10*3/uL — ABNORMAL HIGH (ref 4.0–10.5)
nRBC: 0 % (ref 0.0–0.2)

## 2019-12-06 LAB — URINALYSIS, ROUTINE W REFLEX MICROSCOPIC
Bilirubin Urine: NEGATIVE
Glucose, UA: NEGATIVE mg/dL
Hgb urine dipstick: NEGATIVE
Ketones, ur: NEGATIVE mg/dL
Leukocytes,Ua: NEGATIVE
Nitrite: NEGATIVE
Protein, ur: NEGATIVE mg/dL
Specific Gravity, Urine: 1.005 (ref 1.005–1.030)
pH: 6 (ref 5.0–8.0)

## 2019-12-06 LAB — TROPONIN I (HIGH SENSITIVITY)
Troponin I (High Sensitivity): 6 ng/L (ref <18)
Troponin I (High Sensitivity): 7 ng/L (ref <18)

## 2019-12-06 NOTE — ED Notes (Signed)
Patient verbalizes understanding of discharge instructions. Opportunity for questioning and answers were provided. Armband removed by staff, pt discharged from ED.  

## 2019-12-06 NOTE — ED Triage Notes (Signed)
Pt had second Covid vaccine last Tuesday ( one week ago)

## 2019-12-06 NOTE — ED Triage Notes (Signed)
[facility] reports pt went to dentist this am for a deep cleaning on half of her mouth, then went to play cards with friends. She had a syncopal event on the toilet, reporting 4 instances of D today. Pt given 530ml NS. After bolus by 144/81. 98% on RA.

## 2019-12-06 NOTE — Discharge Instructions (Addendum)
Return if any problems.  See your Physician for recheck  °

## 2019-12-07 NOTE — ED Provider Notes (Signed)
[facility] EMERGENCY DEPARTMENT Provider Note   CSN: [id number] Arrival date & time: 12/06/19  1500     History Chief Complaint  Patient presents with  . Loss of Consciousness    Carla Bennett is a 85 y.o. female.  Pt reports she had a deep dental cleaning this am.  Pt reports she did not eat or drink.  Pt reports she had abdominal cramping and had 5 episodes of diarrhea.  Pt reports she passed out after last episode.  Pt thinks she may be dehydrated.   The history is provided by the patient. No language interpreter was used.  Loss of Consciousness Episode history:  Single Most recent episode:  Today Progression:  Resolved Chronicity:  New Context: bowel movement and dehydration   Witnessed: yes   Relieved by:  Nothing Worsened by:  Nothing Ineffective treatments:  None tried Associated symptoms: dizziness and weakness        Past Medical History:  Diagnosis Date  . Allergy   . Aortic regurgitation   . Arthritis    hands, neck  . Basal cell carcinoma   . Benign neoplasm of colon   . Breast cancer of upper-outer quadrant of left female breast ([place]) 02/23/2015   4'16-left breast cancer-surgery, chemo, radiation-[name] follows every 3 months  . Cataract    beginning stages-monitoring by MD  . Cholelithiasis    resolved with surgery  . Chronic systolic CHF (congestive heart failure) ([place]) 02/26/2016   A.  Echo 02/21/16:  Mild LVH, EF 20-25%, diff HK, Gr 3 DD, mod AI, mod to severe MR, mod LAE, mod redcued RVSF, mild RAE, mod TR, PASP 53 mmHg, small pericardial effusion, mod L pleural effusion  //  B. Echo 6/17:  Mild LVH EF 30-35%, diffuse HK, grade 1 diastolic dysfunction, trivial AI, MAC, mild LAE, normal RVSF, trivial pericardial effusion    . Cystocele   . Diverticulitis   . Diverticulosis of colon (without mention of hemorrhage)   . Gout    resolved -  . Hearing loss    some hearing loss in right ear- no hearing aids  . Heart murmur   .  Hematuria    negative urology work up  . History of blood transfusion 1971   x 1 with D&C -   . Hx of abnormal cervical Pap smear    LEEP-CIN I, negative margins and ECC  . Hypercalcemia   . Hypertension   . Mitral regurgitation   . 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  . OA (osteoarthritis) of hip    right  . Other issue of medical certificates    uterine myomata  . Parathyroid abnormality ([facility])    being followed Dr. Barbette Hair  . Radiation 10/01/15-11/16/15   left breast axillary and supraclavicular reigion 45 gray, left breast 50.4 gray, lumpectomy cavity boost 10 gray  . Renal disease 3/16   Stage 3 kidney disease-seeing Dr Buddy Duty and Dr Marval Regal  . Uterine prolaps    resolved after surgery  . Vitamin D deficiency     Patient Active Problem List   Diagnosis Date Noted  . Diverticulosis of colon without hemorrhage   . AP (abdominal pain)   . Abnormal CT of the abdomen   . Chronic systolic CHF (congestive heart failure) ([place]) 02/26/2016  . Dilated cardiomyopathy ([place]) 02/26/2016  . Pericardial effusion 02/26/2016  . CKD (chronic kidney disease) 02/26/2016  . Essential hypertension 07/26/2015  .  Central line complication 86/76/7209  . Sinusitis 06/07/2015  . Hypercalcemia 05/24/2015  . Family history of malignant neoplasm of gastrointestinal tract 03/08/2015  . Family history of malignant neoplasm of breast 03/08/2015  . Breast cancer of upper-outer quadrant of left female breast ([place]) 02/23/2015  . Hematochezia 01/06/2013  . Abdominal pain, other specified site 01/06/2013    Past Surgical History:  Procedure Laterality Date  . BREAST LUMPECTOMY Left 08/2015  . CHOLECYSTECTOMY    . COLONOSCOPY  01/18/2013   [name]  . COLONOSCOPY N/A 06/26/2016   Procedure: COLONOSCOPY;  Surgeon: Irene Shipper, MD;  Location: [facility];  Service: Endoscopy;  Laterality: N/A;  . CYSTOCELE REPAIR  08/2010   Rectocele, vault prolapse  . DILATION AND  CURETTAGE OF UTERUS     x2  . PORT-A-CATH REMOVAL Right 08/23/2015   Procedure: REMOVAL PORT-A-CATH;  Surgeon: Erroll Luna, MD;  Location: [facility];  Service: General;  Laterality: Right;  . PORTACATH PLACEMENT Right 03/20/2015   Procedure: INSERTION PORT-A-CATH;  Surgeon: Erroll Luna, MD;  Location: [facility];  Service: General;  Laterality: Right;  . RADIOACTIVE SEED GUIDED PARTIAL MASTECTOMY WITH AXILLARY SENTINEL LYMPH NODE BIOPSY Left 08/23/2015   Procedure: LEFT BREAST PARTIAL MASTECTOMY WITH SENTINEL LYMPH NODE MAPPING;  Surgeon: Erroll Luna, MD;  Location: [facility];  Service: General;  Laterality: Left;  . TONSILLECTOMY AND ADENOIDECTOMY    . TOTAL VAGINAL HYSTERECTOMY     secondary to prolapse, ovaries not removed     OB History    Gravida  6   Para  4   Term      Preterm      AB  2   Living  4     SAB  2   TAB      Ectopic      Multiple      Live Births              Family History  Problem Relation Age of Onset  . Lung cancer Father 26  . Parkinson's disease Mother   . Alzheimer's disease Mother        ? diag  . Lung cancer Mother   . Breast cancer Sister 49  . Multiple sclerosis Sister   . Prostate cancer Maternal Uncle 63  . Cancer Maternal Uncle        stomache  . Cancer Maternal Uncle 90       GI cancer - ? stomach  . Cancer Cousin 75       mat female first cousin (son of mat uncle with GI cancer) with ureter cancer  . Colon cancer Neg Hx   . Esophageal cancer Neg Hx   . Rectal cancer Neg Hx   . Stomach cancer Neg Hx   . Colon polyps Neg Hx     Social History   Tobacco Use  . Smoking status: Never Smoker  . Smokeless tobacco: Never Used  Substance Use Topics  . Alcohol use: Yes    Comment: occasionally  wine/beer  . Drug use: No    Home Medications Prior to Admission medications   Medication Sig Start Date End Date Taking? Authorizing Provider  B Complex Vitamins (VITAMIN B COMPLEX PO) Take  1 tablet by mouth daily.    Yes [provider]  calcium carbonate (TUMS) 500 MG chewable tablet Chew 2 tablets (400 mg of elemental calcium total) by mouth 3 times/day as needed-between meals & bedtime for indigestion or heartburn.  Patient taking differently: Chew 2 tablets by mouth 2 (two) times daily as needed for indigestion or heartburn.  11/17/18  Yes Nicholas Lose, MD  carvedilol (COREG) 12.5 MG tablet Take 1 tablet (12.5 mg total) by mouth 2 (two) times daily with a meal. 1/2 tab in AM and 1 tab in PM Patient taking differently: Take 12.5 mg by mouth See admin instructions. Take 12.5 mg by mouth in the morning and 12.5 mg in the evening 11/21/19  Yes Nicholas Lose, MD  furosemide (LASIX) 20 MG tablet Take 1 tablet (20 mg total) by mouth daily. 01/12/17  Yes Jerline Pain, MD  hyoscyamine (LEVSIN) 0.125 MG tablet TAKE 1 TABLET EVERY 4-6 HOURS AS NEEDED FOR ABDOMINAL PAIN Patient taking differently: Take 0.125 mg by mouth See admin instructions. Take 0.125 mg by mouth every 4-6 hours as needed for abdominal pain 05/03/19  Yes Irene Shipper, MD  lisinopril (PRINIVIL,ZESTRIL) 40 MG tablet Take 40 mg by mouth at bedtime.  09/01/13  Yes [provider]  loratadine (CLARITIN) 10 MG tablet Take 10 mg by mouth daily.   Yes [provider]  metroNIDAZOLE (METROCREAM) 0.75 % cream Apply 1 application topically daily.  11/27/19  Yes [provider]  spironolactone (ALDACTONE) 25 MG tablet TAKE 1/2 TABLET BY MOUTH EVERY DAY Patient taking differently: Take 12.5 mg by mouth at bedtime.  07/25/19  Yes Jerline Pain, MD  triamcinolone ointment (KENALOG) 0.1 % Apply 1 application topically 2 (two) times daily as needed (for itching).  06/16/19  Yes [provider]    Allergies    Patient has no known allergies.  Review of Systems   Review of Systems  Cardiovascular: Positive for syncope.  Neurological: Positive for dizziness and weakness.  All other systems  reviewed and are negative.   Physical Exam Updated Vital Signs BP (!) 175/89   Pulse 90   Temp 97.7 F (36.5 C) (Oral)   Resp 18   Ht 5' (1.524 m)   Wt 70.3 kg   LMP 10/20/1996   SpO2 96%   BMI 30.27 kg/m   Physical Exam Vitals and nursing note reviewed.  Constitutional:      Appearance: She is well-developed.  HENT:     Head: Normocephalic.     Nose: Nose normal.     Mouth/Throat:     Mouth: Mucous membranes are moist.  Eyes:     Pupils: Pupils are equal, round, and reactive to light.  Cardiovascular:     Rate and Rhythm: Normal rate and regular rhythm.  Pulmonary:     Effort: Pulmonary effort is normal.  Abdominal:     General: Abdomen is flat. There is no distension.  Musculoskeletal:        General: Normal range of motion.     Cervical back: Normal range of motion.  Skin:    General: Skin is warm.  Neurological:     Mental Status: She is alert and oriented to person, place, and time.  Psychiatric:        Mood and Affect: Mood normal.     ED Results / Procedures / Treatments   Labs (all labs ordered are listed, but only abnormal results are displayed) Labs Reviewed  CBC WITH DIFFERENTIAL/PLATELET - Abnormal; Notable for the following components:      Result Value   WBC 15.4 (*)    RBC 5.18 (*)    Hemoglobin 15.4 (*)    HCT 47.2 (*)    Neutro Abs  13.1 (*)    All other components within normal limits  COMPREHENSIVE METABOLIC PANEL - Abnormal; Notable for the following components:   CO2 21 (*)    Glucose, Bld 103 (*)    Creatinine, Ser 1.19 (*)    Total Bilirubin 1.5 (*)    GFR calc non Af Amer 42 (*)    GFR calc Af Amer 49 (*)    All other components within normal limits  URINALYSIS, ROUTINE W REFLEX MICROSCOPIC  TROPONIN I (HIGH SENSITIVITY)  TROPONIN I (HIGH SENSITIVITY)    EKG EKG Interpretation  Date/Time:  Tuesday December 06 2019 15:23:33 EST Ventricular Rate:  71 PR Interval:    QRS Duration: 84 QT Interval:  399 QTC  Calculation: 434 R Axis:   -21 Text Interpretation: Sinus rhythm Borderline left axis deviation Probable anteroseptal infarct, old No significant change since last tracing Confirmed by Wandra Arthurs [phone]) on 12/06/2019 3:27:01 PM   Radiology DG Chest Port 1 View  Result Date: 12/06/2019 CLINICAL DATA:  Syncope EXAM: PORTABLE CHEST 1 VIEW COMPARISON:  02/07/2016 FINDINGS: Heart size and vascularity normal. Lungs are clear without infiltrate or effusion. Atherosclerotic aortic arch. IMPRESSION: No active disease. Electronically Signed   By: Franchot Gallo M.D.   On: 12/06/2019 17:36    Procedures Procedures (including critical care time)  Medications Ordered in ED Medications - No data to display  ED Course  I have reviewed the triage vital signs and the nursing notes.  Pertinent labs & imaging results that were available during my care of the patient were reviewed by me and considered in my medical decision making (see chart for details).    MDM Rules/Calculators/A&P                      MDM: Ekg no acute abnormality, labs reviewed.  Pt given iv fluids x 1 liter.  Pt able to eat, drink and ambulate without difficulty.    Final Clinical Impression(s) / ED Diagnoses Final diagnoses:  Vasovagal syncope    Rx / DC Orders ED Discharge Orders    None    An After Visit Summary was printed and given to the patient.    Sidney Ace 12/07/19 [id number]    Carmin Muskrat, MD 12/08/19 2216

## 2019-12-16 DIAGNOSIS — H2513 Age-related nuclear cataract, bilateral: Secondary | ICD-10-CM | POA: Diagnosis not present

## 2019-12-16 DIAGNOSIS — H5203 Hypermetropia, bilateral: Secondary | ICD-10-CM | POA: Diagnosis not present

## 2020-01-09 DIAGNOSIS — N183 Chronic kidney disease, stage 3 unspecified: Secondary | ICD-10-CM | POA: Diagnosis not present

## 2020-01-09 DIAGNOSIS — I129 Hypertensive chronic kidney disease with stage 1 through stage 4 chronic kidney disease, or unspecified chronic kidney disease: Secondary | ICD-10-CM | POA: Diagnosis not present

## 2020-01-09 DIAGNOSIS — I5022 Chronic systolic (congestive) heart failure: Secondary | ICD-10-CM | POA: Diagnosis not present

## 2020-01-15 ENCOUNTER — Other Ambulatory Visit: Payer: Self-pay | Admitting: Cardiology

## 2020-01-15 DIAGNOSIS — I420 Dilated cardiomyopathy: Secondary | ICD-10-CM

## 2020-02-10 ENCOUNTER — Encounter: Payer: Self-pay | Admitting: Cardiology

## 2020-02-10 ENCOUNTER — Telehealth: Payer: Self-pay | Admitting: Cardiology

## 2020-02-10 ENCOUNTER — Ambulatory Visit (INDEPENDENT_AMBULATORY_CARE_PROVIDER_SITE_OTHER): Payer: Medicare Other | Admitting: Cardiology

## 2020-02-10 ENCOUNTER — Other Ambulatory Visit: Payer: Self-pay

## 2020-02-10 VITALS — BP 116/68 | HR 70 | Ht 60.0 in | Wt 155.0 lb

## 2020-02-10 DIAGNOSIS — I700 Atherosclerosis of aorta: Secondary | ICD-10-CM | POA: Diagnosis not present

## 2020-02-10 DIAGNOSIS — I10 Essential (primary) hypertension: Secondary | ICD-10-CM

## 2020-02-10 DIAGNOSIS — I5022 Chronic systolic (congestive) heart failure: Secondary | ICD-10-CM

## 2020-02-10 NOTE — Telephone Encounter (Signed)
Dr Marlou Porch is aware of this request and will review ekgs as requested.

## 2020-02-10 NOTE — Telephone Encounter (Signed)
Patient forgot to mention that she ended up going to [facility] by Ambulance on 02-16. She had an EKG done in the ambulance and another done in the ER. She wanted to have Dr. Marlou Porch look at those EKGs and visit information and interpret the results.

## 2020-02-10 NOTE — Progress Notes (Signed)
Cardiology Office Note    Date:  02/10/2020   ID:  Carlis Carla Bennett, DOB [birth year], MRN [id number]  PCP:  Donald Prose, MD  Cardiologist:   Candee Furbish, MD     History of Present Illness:  Carla Bennett is a 85 y.o. female here for follow-up of systolic heart failure, cardiomyopathy, previous chemotherapy for breast cancer, pericardial effusion.  She had an echo performed on 03/13/15 which at that time showed no evidence of pericardial effusion. Mild mitral and aortic regurgitation noted. Normal ejection fraction. This was pre-chemotherapy to breast cancer.  Moderate-sized pericardial effusion was personally viewed on CT scan from 02/07/16. Aortic atherosclerosis and mild coronary artery atherosclerosis was also noted. Pleural effusions were also noted.  In general, she did feel weakness and fatigue surrounding her chemotherapy/radiation however in March for instance she was feeling much better from a dyspnea standpoint. Really did not have any significant issues with that she states. Her shortness of breath when going up stairs is much improved, fairly relieved She denies any chest pain, syncope, bleeding, fevers, orthopnea, and explained weight loss, night sweats  On her echocardiogram originally in April, her ejection fraction was markedly reduced at 20-25%. She had been treated with Adriamycin. This also showed moderate pericardial effusion.  A subsequent follow-up echo 1 month later showed ejection fraction of 35%. Improved. Effusion was trace.  05/26/18 - last Sept BP was quite high. Scared. Trying different times of medication. All good since June. Saw Dr. Carollee Leitz. BP too low in AM with coreg AM. Stopped AM dose. MRI done - renal OK. No renal u/s needed. She showed me multiple blood pressure readings.  She was frustrated previously.  No chest pain.  [id number] for systolic heart failure follow-up-doing very well no shortness of breath may be a little bit of tiredness  but overall feeling well.  No chest pain no syncope no bleeding.  Tolerating medications.  Usually in the mid day her blood pressures do run a little bit low when she feels slightly worse.  Asked her to try to split her lisinopril into half.  02/10/20 - BP too low in afternoon, washed out. Try cutting lisinopril in half BID. Now better.    Past Medical History:  Diagnosis Date  . Allergy   . Aortic regurgitation   . Arthritis    hands, neck  . Basal cell carcinoma   . Benign neoplasm of colon   . Breast cancer of upper-outer quadrant of left female breast ([place]) 02/23/2015   4'16-left breast cancer-surgery, chemo, radiation-[name] follows every 3 months  . Cataract    beginning stages-monitoring by MD  . Cholelithiasis    resolved with surgery  . Chronic systolic CHF (congestive heart failure) ([place]) 02/26/2016   A.  Echo 02/21/16:  Mild LVH, EF 20-25%, diff HK, Gr 3 DD, mod AI, mod to severe MR, mod LAE, mod redcued RVSF, mild RAE, mod TR, PASP 53 mmHg, small pericardial effusion, mod L pleural effusion  //  B. Echo 6/17:  Mild LVH EF 30-35%, diffuse HK, grade 1 diastolic dysfunction, trivial AI, MAC, mild LAE, normal RVSF, trivial pericardial effusion    . Cystocele   . Diverticulitis   . Diverticulosis of colon (without mention of hemorrhage)   . Gout    resolved -  . Hearing loss    some hearing loss in right ear- no hearing aids  . Heart murmur   . Hematuria    negative urology work up  . History  of blood transfusion 1971   x 1 with D&C -   . Hx of abnormal cervical Pap smear    LEEP-CIN I, negative margins and ECC  . Hypercalcemia   . Hypertension   . Mitral regurgitation   . 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  . OA (osteoarthritis) of hip    right  . Other issue of medical certificates    uterine myomata  . Parathyroid abnormality (HCC)    being followed Dr. Barbette Hair  . Radiation 10/01/15-11/16/15   left breast axillary and supraclavicular  reigion 45 gray, left breast 50.4 gray, lumpectomy cavity boost 10 gray  . Renal disease 3/16   Stage 3 kidney disease-seeing Dr Buddy Duty and Dr Marval Regal  . Uterine prolaps    resolved after surgery  . Vitamin D deficiency     Past Surgical History:  Procedure Laterality Date  . BREAST LUMPECTOMY Left 08/2015  . CHOLECYSTECTOMY    . COLONOSCOPY  01/18/2013   [name]  . COLONOSCOPY N/A 06/26/2016   Procedure: COLONOSCOPY;  Surgeon: Irene Shipper, MD;  Location: [facility];  Service: Endoscopy;  Laterality: N/A;  . CYSTOCELE REPAIR  08/2010   Rectocele, vault prolapse  . DILATION AND CURETTAGE OF UTERUS     x2  . PORT-A-CATH REMOVAL Right 08/23/2015   Procedure: REMOVAL PORT-A-CATH;  Surgeon: Erroll Luna, MD;  Location: [facility];  Service: General;  Laterality: Right;  . PORTACATH PLACEMENT Right 03/20/2015   Procedure: INSERTION PORT-A-CATH;  Surgeon: Erroll Luna, MD;  Location: [facility];  Service: General;  Laterality: Right;  . RADIOACTIVE SEED GUIDED PARTIAL MASTECTOMY WITH AXILLARY SENTINEL LYMPH NODE BIOPSY Left 08/23/2015   Procedure: LEFT BREAST PARTIAL MASTECTOMY WITH SENTINEL LYMPH NODE MAPPING;  Surgeon: Erroll Luna, MD;  Location: [facility];  Service: General;  Laterality: Left;  . TONSILLECTOMY AND ADENOIDECTOMY    . TOTAL VAGINAL HYSTERECTOMY     secondary to prolapse, ovaries not removed    Current Medications: Outpatient Medications Prior to Visit  Medication Sig Dispense Refill  . B Complex Vitamins (VITAMIN B COMPLEX PO) Take 1 tablet by mouth daily.     . calcium carbonate (TUMS) 500 MG chewable tablet Chew 2 tablets (400 mg of elemental calcium total) by mouth 3 times/day as needed-between meals & bedtime for indigestion or heartburn. (Patient taking differently: Chew 2 tablets by mouth 2 (two) times daily as needed for indigestion or heartburn. )    . carvedilol (COREG) 12.5 MG tablet Take 1 tablet (12.5 mg total) by mouth 2 (two)  times daily with a meal. 1/2 tab in AM and 1 tab in PM (Patient taking differently: Take 12.5 mg by mouth See admin instructions. Take 12.5 mg by mouth in the morning and 12.5 mg in the evening)    . furosemide (LASIX) 20 MG tablet Take 1 tablet (20 mg total) by mouth daily. 30 tablet 6  . hyoscyamine (LEVSIN) 0.125 MG tablet TAKE 1 TABLET EVERY 4-6 HOURS AS NEEDED FOR ABDOMINAL PAIN (Patient taking differently: Take 0.125 mg by mouth See admin instructions. Take 0.125 mg by mouth every 4-6 hours as needed for abdominal pain) 30 tablet 3  . lisinopril (ZESTRIL) 40 MG tablet Take 20 mg by mouth 2 (two) times daily.    Marland Kitchen loratadine (CLARITIN) 10 MG tablet Take 10 mg by mouth daily.    . metroNIDAZOLE (METROCREAM) 0.75 % cream Apply 1 application topically daily.     Marland Kitchen  spironolactone (ALDACTONE) 25 MG tablet TAKE 1/2 TABLET BY MOUTH EVERY DAY 45 tablet 1  . triamcinolone ointment (KENALOG) 0.1 % Apply 1 application topically 2 (two) times daily as needed (for itching).     Marland Kitchen lisinopril (PRINIVIL,ZESTRIL) 40 MG tablet Take 40 mg by mouth at bedtime.      No facility-administered medications prior to visit.     Allergies:   Patient has no known allergies.   Social History   Socioeconomic History  . Marital status: Widowed    Spouse name: Not on file  . Number of children: 4  . Years of education: Not on file  . Highest education level: Not on file  Occupational History  . Occupation: retired  Tobacco Use  . Smoking status: Never Smoker  . Smokeless tobacco: Never Used  Substance and Sexual Activity  . Alcohol use: Yes    Comment: occasionally  wine/beer  . Drug use: No  . Sexual activity: Never    Birth control/protection: Surgical    Comment: Vaginal hysterectomy  Other Topics Concern  . Not on file  Social History Narrative  . Not on file   Social Determinants of Health   Financial Resource Strain:   . Difficulty of Paying Living Expenses:   Food Insecurity:   . Worried  About Charity fundraiser in the Last Year:   . Arboriculturist in the Last Year:   Transportation Needs:   . Film/video editor (Medical):   Marland Kitchen Lack of Transportation (Non-Medical):   Physical Activity:   . Days of Exercise per Week:   . Minutes of Exercise per Session:   Stress:   . Feeling of Stress :   Social Connections:   . Frequency of Communication with Friends and Family:   . Frequency of Social Gatherings with Friends and Family:   . Attends Religious Services:   . Active Member of Clubs or Organizations:   . Attends Archivist Meetings:   Marland Kitchen Marital Status:      Family History:  The patient's family history includes Alzheimer's disease in her mother; Breast cancer (age of onset: 62) in her sister; Cancer in her maternal uncle; Cancer (age of onset: 62) in her cousin and maternal uncle; Lung cancer in her mother; Lung cancer (age of onset: 86) in her father; Multiple sclerosis in her sister; Parkinson's disease in her mother; Prostate cancer (age of onset: 53) in her maternal uncle.   ROS:   Please see the history of present illness.    Review of Systems  All other systems reviewed and are negative.    PHYSICAL EXAM:   VS:  BP 116/68   Pulse 70   Ht 5' (1.524 m)   Wt 155 lb (70.3 kg)   LMP 10/20/1996   SpO2 100%   BMI 30.27 kg/m    GEN: Well nourished, well developed, in no acute distress  HEENT: normal  Neck: no JVD, carotid bruits, or masses Cardiac: RRR; no murmurs, rubs, or gallops,no edema  Respiratory:  clear to auscultation bilaterally, normal work of breathing GI: soft, nontender, nondistended, + BS MS: no deformity or atrophy  Skin: warm and dry, no rash, spider veins right greater than left Neuro:  Alert and Oriented x 3, Strength and sensation are intact Psych: euthymic mood, full affect    Wt Readings from Last 3 Encounters:  02/10/20 155 lb (70.3 kg)  12/06/19 155 lb (70.3 kg)  12/01/19 157 lb 6.4 oz (71.4  kg)       Studies/Labs Reviewed:   EKG:   EKG from today 08/12/2019-sinus rhythm old inferior infarct pattern poor R wave progression otherwise normal.  3/18 shows sinus rhythm 64 with no other abnormalitis..  The ekg from 02/07/16 shows sinus rhythm, lower voltage, poor R-wave progression, no other significant abnormality.  Recent Labs: 12/06/2019: ALT 17; BUN 14; Creatinine, Ser 1.19; Hemoglobin 15.4; Platelets 326; Potassium 4.0; Sodium 139   Lipid Panel No results found for: CHOL, TRIG, HDL, CHOLHDL, VLDL, LDLCALC, LDLDIRECT  Additional studies/ records that were reviewed today include:  CT scan prior old notes, lab work, prior echocardiogram  ECHO 03/26/16 - Left ventricle: The cavity size was normal. Wall thickness was   increased in a pattern of mild LVH. Systolic function was   moderately to severely reduced. The estimated ejection fraction   was in the range of 30% to 35%. Diffuse hypokinesis. Doppler   parameters are consistent with abnormal left ventricular   relaxation (grade 1 diastolic dysfunction). - Aortic valve: There was no stenosis. There was trivial   regurgitation. - Mitral valve: Mildly calcified annulus. Normal thickness leaflets   . - Left atrium: The atrium was mildly dilated. - Right ventricle: The cavity size was normal. Systolic function   was normal. - Pulmonary arteries: No complete TR Doppler jet so unable to   estimate PA systolic pressure. - Inferior vena cava: The vessel was dilated. The respirophasic   diameter changes were blunted (< 50%), consistent with elevated   central venous pressure. - Pericardium, extracardiac: A trivial pericardial effusion was   identified.  Impressions:  - Normal LV size with mild LV hypertrophy. EF 30-35% with diffuse   hypokinesis. Normal RV size and systolic function. No significant   valvular abnormalities.  ECHO 01/21/17 - Left ventricle: The cavity size was normal. Wall thickness was   normal. Systolic function  was mildly reduced. The estimated   ejection fraction was in the range of 45% to 50%. Inferolateral   hypokinesis. Abnormal GLPSS at -14% wtih inferior strain   abnormality. Doppler parameters are consistent with abnormal left   ventricular relaxation (grade 1 diastolic dysfunction). The E/e&'   ratio is >15, suggesting elevated LV filling pressure. - Aortic valve: Trileaflet. Sclerosis without stenosis. There was   trivial regurgitation. - Left atrium: The atrium was normal in size. - Inferior vena cava: The vessel was normal in size. The   respirophasic diameter changes were in the normal range (>= 50%),   consistent with normal central venous pressure.  Impressions:  - Compared to a prior study in 2017, the LVEF has improved to   45-50% - there is inferior and inferolateral hypokinesis.   ASSESSMENT:    1. Chronic systolic CHF (congestive heart failure) ([place])   2. Essential hypertension   3. Aortic atherosclerosis (HCC)      PLAN:  In order of problems listed above:  Essential hypertension -She takes her blood pressure 3 times a day.  In midday she has been demonstrating low blood pressures that make her feel washed out as she wants to lay down.  The suggestion was made to cut her lisinopril in half and take it twice daily which has helped out significantly.  Her middle day blood pressures are much better.  Pericardial effusion   - Seen on CT scan 02/07/16 moderate in size. This has resolved.  Excellent  Aortic atherosclerosis, mild coronary atherosclerosis seen on CT  - Secondary prevention.  No changes made.  Chronic systolic heart failure  - Doing quite well, NYHA class I, no shortness of breath  - No angina, EF improved 50%  - Likely nonischemic cardiomyopathy, likely from chemotherapy or perhaps viral mediated.  Overall improved however.  This is great.  Continue with beta-blocker and ACE inhibitor.  - I personally reviewed CT scan of chest, no obvious coronary  calcium detected. EF prior to breast cancer was normal. This minimizes the likelihood of ischemic cardiomyopathy. Continue with current therapy.  Doing very well. EF has improved.  Carla.   Medication Adjustments/Labs and Tests Ordered: Current medicines are reviewed at length with the patient today.  Concerns regarding medicines are outlined above.  Medication changes, Labs and Tests ordered today are listed in the Patient Instructions below. Patient Instructions  Medication Instructions:  The current medical regimen is effective;  continue present plan and medications.  *If you need a refill on your cardiac medications before your next appointment, please call your pharmacy*  Follow-Up: At [facility], you and your health needs are our priority.  As part of our continuing mission to provide you with exceptional heart care, we have created designated Provider Care Teams.  These Care Teams include your primary Cardiologist (physician) and Advanced Practice Providers (APPs -  Physician Assistants and Nurse Practitioners) who all work together to provide you with the care you need, when you need it.  We recommend signing up for the patient portal called "MyChart".  Sign up information is provided on this After Visit Summary.  MyChart is used to connect with patients for Virtual Visits (Telemedicine).  Patients are able to view lab/test results, encounter notes, upcoming appointments, etc.  Non-urgent messages can be sent to your provider as well.   To learn more about what you can do with MyChart, go to [email or web address].    Your next appointment:   6 month(s)  The format for your next appointment:   In Person  Provider:   Candee Furbish, MD   Thank you for choosing [facility]!!         Signed, Candee Furbish, MD  02/10/2020 12:07 PM    [address], [place], [place]  [address] Phone: [phone]; Fax: [phone]

## 2020-02-10 NOTE — Patient Instructions (Signed)

## 2020-02-14 NOTE — Telephone Encounter (Signed)
Left message on VM - that Dr Marlou Porch did review the EKGs as requested and they looked stable compared to previous ones.  Requested pt c/b with any other questions.

## 2020-02-14 NOTE — Telephone Encounter (Signed)
Reviewed ER notes and ECG. ECG is stable from prior.  BP likely got too low following dental visit. Glad your regiment was changed.   Candee Furbish, MD

## 2020-02-17 DIAGNOSIS — I129 Hypertensive chronic kidney disease with stage 1 through stage 4 chronic kidney disease, or unspecified chronic kidney disease: Secondary | ICD-10-CM | POA: Diagnosis not present

## 2020-02-17 DIAGNOSIS — I5022 Chronic systolic (congestive) heart failure: Secondary | ICD-10-CM | POA: Diagnosis not present

## 2020-02-17 DIAGNOSIS — N183 Chronic kidney disease, stage 3 unspecified: Secondary | ICD-10-CM | POA: Diagnosis not present

## 2020-03-27 DIAGNOSIS — L821 Other seborrheic keratosis: Secondary | ICD-10-CM | POA: Diagnosis not present

## 2020-03-27 DIAGNOSIS — L719 Rosacea, unspecified: Secondary | ICD-10-CM | POA: Diagnosis not present

## 2020-03-27 DIAGNOSIS — D225 Melanocytic nevi of trunk: Secondary | ICD-10-CM | POA: Diagnosis not present

## 2020-03-27 DIAGNOSIS — L309 Dermatitis, unspecified: Secondary | ICD-10-CM | POA: Diagnosis not present

## 2020-03-27 DIAGNOSIS — Z85828 Personal history of other malignant neoplasm of skin: Secondary | ICD-10-CM | POA: Diagnosis not present

## 2020-04-05 DIAGNOSIS — N183 Chronic kidney disease, stage 3 unspecified: Secondary | ICD-10-CM | POA: Diagnosis not present

## 2020-04-05 DIAGNOSIS — I129 Hypertensive chronic kidney disease with stage 1 through stage 4 chronic kidney disease, or unspecified chronic kidney disease: Secondary | ICD-10-CM | POA: Diagnosis not present

## 2020-04-05 DIAGNOSIS — I5022 Chronic systolic (congestive) heart failure: Secondary | ICD-10-CM | POA: Diagnosis not present

## 2020-04-26 ENCOUNTER — Other Ambulatory Visit: Payer: Self-pay | Admitting: Cardiology

## 2020-04-26 DIAGNOSIS — I420 Dilated cardiomyopathy: Secondary | ICD-10-CM

## 2020-05-01 ENCOUNTER — Other Ambulatory Visit: Payer: Self-pay | Admitting: Internal Medicine

## 2020-05-01 DIAGNOSIS — E042 Nontoxic multinodular goiter: Secondary | ICD-10-CM

## 2020-05-01 DIAGNOSIS — E210 Primary hyperparathyroidism: Secondary | ICD-10-CM | POA: Diagnosis not present

## 2020-05-07 ENCOUNTER — Ambulatory Visit
Admission: RE | Admit: 2020-05-07 | Discharge: 2020-05-07 | Disposition: A | Discharge status: Home / Self Care | Payer: Medicare Other | Source: Ambulatory Visit | Attending: Internal Medicine | Admitting: Internal Medicine

## 2020-05-07 DIAGNOSIS — E042 Nontoxic multinodular goiter: Secondary | ICD-10-CM

## 2020-05-07 DIAGNOSIS — E041 Nontoxic single thyroid nodule: Secondary | ICD-10-CM | POA: Diagnosis not present

## 2020-05-15 DIAGNOSIS — R928 Other abnormal and inconclusive findings on diagnostic imaging of breast: Secondary | ICD-10-CM | POA: Diagnosis not present

## 2020-05-15 DIAGNOSIS — Z853 Personal history of malignant neoplasm of breast: Secondary | ICD-10-CM | POA: Diagnosis not present

## 2020-05-17 ENCOUNTER — Telehealth: Payer: Self-pay | Admitting: Internal Medicine

## 2020-05-17 NOTE — Telephone Encounter (Signed)
Patient is calling states she did not receive the refill pharmacy called her that they do not have it and the patient said she is leaving out of town and needs her medication

## 2020-05-17 NOTE — Telephone Encounter (Signed)
Caryl Pina from [facility] is requesting a refill on the pt's hyoscyamine.

## 2020-05-18 MED ORDER — HYOSCYAMINE SULFATE 0.125 MG PO TABS: ORAL_TABLET | ORAL | 3 refills | Status: DC

## 2020-05-18 NOTE — Telephone Encounter (Signed)
Refilled Levsin

## 2020-05-29 ENCOUNTER — Ambulatory Visit: Payer: Medicare Other | Admitting: Physician Assistant

## 2020-06-29 DIAGNOSIS — N183 Chronic kidney disease, stage 3 unspecified: Secondary | ICD-10-CM | POA: Diagnosis not present

## 2020-06-29 DIAGNOSIS — I129 Hypertensive chronic kidney disease with stage 1 through stage 4 chronic kidney disease, or unspecified chronic kidney disease: Secondary | ICD-10-CM | POA: Diagnosis not present

## 2020-06-29 DIAGNOSIS — I5022 Chronic systolic (congestive) heart failure: Secondary | ICD-10-CM | POA: Diagnosis not present

## 2020-07-09 ENCOUNTER — Ambulatory Visit (INDEPENDENT_AMBULATORY_CARE_PROVIDER_SITE_OTHER): Payer: Medicare Other | Admitting: Physician Assistant

## 2020-07-09 ENCOUNTER — Encounter: Payer: Self-pay | Admitting: Physician Assistant

## 2020-07-09 VITALS — BP 142/78 | HR 74 | Ht 60.0 in | Wt 153.5 lb

## 2020-07-09 DIAGNOSIS — R197 Diarrhea, unspecified: Secondary | ICD-10-CM

## 2020-07-09 DIAGNOSIS — R1084 Generalized abdominal pain: Secondary | ICD-10-CM | POA: Diagnosis not present

## 2020-07-09 DIAGNOSIS — R1013 Epigastric pain: Secondary | ICD-10-CM | POA: Diagnosis not present

## 2020-07-09 MED ORDER — HYOSCYAMINE SULFATE 0.125 MG PO TABS: ORAL_TABLET | ORAL | 3 refills | Status: DC

## 2020-07-09 NOTE — Progress Notes (Signed)
Chief Complaint: Abdominal pain  HPI:    Carla Bennett is an 85 year old female with a past medical history as listed below, known to Dr. Henrene Pastor, who was referred to me by Donald Prose, MD for a complaint of abdominal pain.      01/26/2018 seen in clinic by Dr. Henrene Pastor.  At that time was noted patient had been seen previously in 2017 by myself and had a CT scan of the abdomen and pelvis with contrast with incidental findings and no evidence for significant vascular disease.  Soft tissue prominence of the cecum measuring 3 cm was said to be indeterminate for which colonoscopy is recommended.  Underwent complete colonoscopy 06/26/2016 with diverticulosis and internal hemorrhoids and no other abnormalities.  She was felt to have intermittent postprandial spasm for which hyoscyamine was given.  She described using this 4 times a year for postprandial abdominal discomfort which lasts less than 30 minutes.  Also discussed some intermittent nausea and occasional rectal pressure and bloating.  At that time her Levsin was refilled she was started on Omeprazole 20 mg daily for reflux.    Today, the patient presents to clinic and tells me that her episodes of abdominal pain have become more frequent now occurring maybe 2-4 times a month.  Tells me that 90% of the time if she takes a Hyoscyamine when this cramping pain starts across her lower abdomen then it will stop within 20 minutes.  Occasionally though it will not work and she will have pain for 2 to 2-1/2 hours which is severe rated as a 9-10/10 and typically culminates in vomiting.  When she vomits then it feels better.  She has noticed that some foods make this worse like [place] meats and cheeses or anything really greasy.    Also describes some abdominal tenderness in her epigastrium if she eats a late night snack and then lays down to sleep.  Tells me that she typically does not have heartburn or reflux but will occasionally take a Tums.    Also describes sometimes  of urgent diarrhea after eating, this occurs maybe once a week.    Patient is a widow and tells me she is at home by herself so when she has severe abdominal pain it makes her somewhat nervous because she almost feels like she is going to pass out.    Denies fever, chills or blood in her stool.  Past Medical History:  Diagnosis Date  . Allergy   . Aortic regurgitation   . Arthritis    hands, neck  . Basal cell carcinoma   . Benign neoplasm of colon   . Breast cancer of upper-outer quadrant of left female breast ([facility]) 02/23/2015   4'16-left breast cancer-surgery, chemo, radiation-[name] follows every 3 months  . Cataract    beginning stages-monitoring by MD  . Cholelithiasis    resolved with surgery  . Chronic systolic CHF (congestive heart failure) ([facility]) 02/26/2016   A.  Echo 02/21/16:  Mild LVH, EF 20-25%, diff HK, Gr 3 DD, mod AI, mod to severe MR, mod LAE, mod redcued RVSF, mild RAE, mod TR, PASP 53 mmHg, small pericardial effusion, mod L pleural effusion  //  B. Echo 6/17:  Mild LVH EF 30-35%, diffuse HK, grade 1 diastolic dysfunction, trivial AI, MAC, mild LAE, normal RVSF, trivial pericardial effusion    . Cystocele   . Diverticulitis   . Diverticulosis of colon (without mention of hemorrhage)   . Gout    resolved -  .  Hearing loss    some hearing loss in right ear- no hearing aids  . Heart murmur   . Hematuria    negative urology work up  . History of blood transfusion 1971   x 1 with D&C -   . Hx of abnormal cervical Pap smear    LEEP-CIN I, negative margins and ECC  . Hypercalcemia   . Hypertension   . Mitral regurgitation   . 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  . OA (osteoarthritis) of hip    right  . Other issue of medical certificates    uterine myomata  . Parathyroid abnormality ([facility])    being followed Dr. Barbette Hair  . Radiation 10/01/15-11/16/15   left breast axillary and supraclavicular reigion 45 gray, left breast 50.4 gray,  lumpectomy cavity boost 10 gray  . Renal disease 3/16   Stage 3 kidney disease-seeing Dr Buddy Duty and Dr Marval Regal  . Uterine prolaps    resolved after surgery  . Vitamin D deficiency     Past Surgical History:  Procedure Laterality Date  . BREAST LUMPECTOMY Left 08/2015  . CHOLECYSTECTOMY    . COLONOSCOPY  01/18/2013   [name]  . COLONOSCOPY N/A 06/26/2016   Procedure: COLONOSCOPY;  Surgeon: Irene Shipper, MD;  Location: [facility];  Service: Endoscopy;  Laterality: N/A;  . CYSTOCELE REPAIR  08/2010   Rectocele, vault prolapse  . DILATION AND CURETTAGE OF UTERUS     x2  . PORT-A-CATH REMOVAL Right 08/23/2015   Procedure: REMOVAL PORT-A-CATH;  Surgeon: Erroll Luna, MD;  Location: [facility];  Service: General;  Laterality: Right;  . PORTACATH PLACEMENT Right 03/20/2015   Procedure: INSERTION PORT-A-CATH;  Surgeon: Erroll Luna, MD;  Location: [facility];  Service: General;  Laterality: Right;  . RADIOACTIVE SEED GUIDED PARTIAL MASTECTOMY WITH AXILLARY SENTINEL LYMPH NODE BIOPSY Left 08/23/2015   Procedure: LEFT BREAST PARTIAL MASTECTOMY WITH SENTINEL LYMPH NODE MAPPING;  Surgeon: Erroll Luna, MD;  Location: [facility];  Service: General;  Laterality: Left;  . TONSILLECTOMY AND ADENOIDECTOMY    . TOTAL VAGINAL HYSTERECTOMY     secondary to prolapse, ovaries not removed    Current Outpatient Medications  Medication Sig Dispense Refill  . B Complex Vitamins (VITAMIN B COMPLEX PO) Take 1 tablet by mouth daily.     . calcium carbonate (TUMS) 500 MG chewable tablet Chew 2 tablets (400 mg of elemental calcium total) by mouth 3 times/day as needed-between meals & bedtime for indigestion or heartburn. (Patient taking differently: Chew 2 tablets by mouth 2 (two) times daily as needed for indigestion or heartburn. )    . carvedilol (COREG) 12.5 MG tablet Take 1 tablet (12.5 mg total) by mouth 2 (two) times daily with a meal. 1/2 tab in AM and 1 tab in PM (Patient  taking differently: Take 12.5 mg by mouth See admin instructions. Take 12.5 mg by mouth in the morning and 12.5 mg in the evening)    . furosemide (LASIX) 20 MG tablet Take 1 tablet (20 mg total) by mouth daily. 30 tablet 6  . hyoscyamine (LEVSIN) 0.125 MG tablet TAKE 1 TABLET EVERY 4-6 HOURS AS NEEDED FOR ABDOMINAL PAIN 30 tablet 3  . lisinopril (ZESTRIL) 40 MG tablet Take 20 mg by mouth 2 (two) times daily.    Marland Kitchen loratadine (CLARITIN) 10 MG tablet Take 10 mg by mouth daily.    . metroNIDAZOLE (METROCREAM) 0.75 % cream Apply 1 application  topically daily.     Marland Kitchen spironolactone (ALDACTONE) 25 MG tablet TAKE 1/2 TABLET BY MOUTH EVERY DAY 45 tablet 1  . triamcinolone ointment (KENALOG) 0.1 % Apply 1 application topically 2 (two) times daily as needed (for itching).      No current facility-administered medications for this visit.    Allergies as of 07/09/2020  . (No Known Allergies)    Family History  Problem Relation Age of Onset  . Lung cancer Father 60  . Parkinson's disease Mother   . Alzheimer's disease Mother        ? diag  . Lung cancer Mother   . Breast cancer Sister 76  . Multiple sclerosis Sister   . Prostate cancer Maternal Uncle 63  . Cancer Maternal Uncle        stomache  . Cancer Maternal Uncle 83       GI cancer - ? stomach  . Cancer Cousin 37       mat female first cousin (son of mat uncle with GI cancer) with ureter cancer  . Colon cancer Neg Hx   . Esophageal cancer Neg Hx   . Rectal cancer Neg Hx   . Stomach cancer Neg Hx   . Colon polyps Neg Hx     Social History   Socioeconomic History  . Marital status: Widowed    Spouse name: Not on file  . Number of children: 4  . Years of education: Not on file  . Highest education level: Not on file  Occupational History  . Occupation: retired  Tobacco Use  . Smoking status: Never Smoker  . Smokeless tobacco: Never Used  Vaping Use  . Vaping Use: Never used  Substance and Sexual Activity  . Alcohol use: Yes      Comment: occasionally  wine/beer  . Drug use: No  . Sexual activity: Never    Birth control/protection: Surgical    Comment: Vaginal hysterectomy  Other Topics Concern  . Not on file  Social History Narrative  . Not on file   Social Determinants of Health   Financial Resource Strain:   . Difficulty of Paying Living Expenses: Not on file  Food Insecurity:   . Worried About Charity fundraiser in the Last Year: Not on file  . Ran Out of Food in the Last Year: Not on file  Transportation Needs:   . Lack of Transportation (Medical): Not on file  . Lack of Transportation (Non-Medical): Not on file  Physical Activity:   . Days of Exercise per Week: Not on file  . Minutes of Exercise per Session: Not on file  Stress:   . Feeling of Stress : Not on file  Social Connections:   . Frequency of Communication with Friends and Family: Not on file  . Frequency of Social Gatherings with Friends and Family: Not on file  . Attends Religious Services: Not on file  . Active Member of Clubs or Organizations: Not on file  . Attends Archivist Meetings: Not on file  . Marital Status: Not on file  Intimate Partner Violence:   . Fear of Current or Ex-Partner: Not on file  . Emotionally Abused: Not on file  . Physically Abused: Not on file  . Sexually Abused: Not on file    Review of Systems:    Constitutional: No weight loss, fever or chills Skin: No rash  Cardiovascular: No chest pain Respiratory: No SOB Gastrointestinal: See HPI and otherwise negative Genitourinary: No dysuria  Neurological: No headache, dizziness or syncope Musculoskeletal: No new muscle or joint pain Hematologic: No bleeding  Psychiatric: No history of depression or anxiety   Physical Exam:  Vital signs: BP (!) 142/78 (BP Location: Right Arm, Patient Position: Sitting, Cuff Size: Normal)   Pulse 74   Ht 5' (1.524 m)   Wt 153 lb 8 oz (69.6 kg)   LMP 10/20/1996   BMI 29.98 kg/m   Constitutional:    Pleasant Elderly Caucasian female appears to be in NAD, Well developed, Well nourished, alert and cooperative Head:  Normocephalic and atraumatic. Eyes:   PEERL, EOMI. No icterus. Conjunctiva pink. Ears:  Normal auditory acuity. Neck:  Supple Throat: Oral cavity and pharynx without inflammation, swelling or lesion.  Respiratory: Respirations even and unlabored. Lungs clear to auscultation bilaterally.   No wheezes, crackles, or rhonchi.  Cardiovascular: Normal S1, S2. No MRG. Regular rate and rhythm. No peripheral edema, cyanosis or pallor.  Gastrointestinal:  Soft, nondistended, nontender. No rebound or guarding. Normal bowel sounds. No appreciable masses or hepatomegaly. Rectal:  Not performed.  Msk:  Symmetrical without gross deformities. Without edema, no deformity or joint abnormality.  Neurologic:  Alert and  oriented x4;  grossly normal neurologically.  Skin:   Dry and intact without significant lesions or rashes. Psychiatric: Demonstrates good judgement and reason without abnormal affect or behaviors.  RELEVANT LABS AND IMAGING: CBC    Component Value Date/Time   WBC 15.4 (H) 12/06/2019 1545   RBC 5.18 (H) 12/06/2019 1545   HGB 15.4 (H) 12/06/2019 1545   HGB 15.7 11/18/2017 0950   HGB 15.4 11/14/2016 1021   HCT 47.2 (H) 12/06/2019 1545   HCT 45.7 11/14/2016 1021   PLT 326 12/06/2019 1545   PLT 309 11/18/2017 0950   PLT 262 11/14/2016 1021   MCV 91.1 12/06/2019 1545   MCV 89.1 11/14/2016 1021   MCH 29.7 12/06/2019 1545   MCHC 32.6 12/06/2019 1545   RDW 12.8 12/06/2019 1545   RDW 13.2 11/14/2016 1021   LYMPHSABS 1.3 12/06/2019 1545   LYMPHSABS 1.3 11/14/2016 1021   MONOABS 0.7 12/06/2019 1545   MONOABS 0.8 11/14/2016 1021   EOSABS 0.2 12/06/2019 1545   EOSABS 0.2 11/14/2016 1021   BASOSABS 0.1 12/06/2019 1545   BASOSABS 0.1 11/14/2016 1021    CMP     Component Value Date/Time   NA 139 12/06/2019 1545   NA 139 03/08/2019 1311   NA 139 11/14/2016 1021   K 4.0  12/06/2019 1545   K 4.8 11/14/2016 1021   CL 107 12/06/2019 1545   CO2 21 (L) 12/06/2019 1545   CO2 29 11/14/2016 1021   GLUCOSE 103 (H) 12/06/2019 1545   GLUCOSE 103 11/14/2016 1021   BUN 14 12/06/2019 1545   BUN 16 03/08/2019 1311   BUN 20.4 11/14/2016 1021   CREATININE 1.19 (H) 12/06/2019 1545   CREATININE 1.41 (H) 11/18/2017 0950   CREATININE 1.3 (H) 11/14/2016 1021   CALCIUM 10.2 12/06/2019 1545   CALCIUM 11.5 (H) 11/14/2016 1021   PROT 6.6 12/06/2019 1545   PROT 7.2 11/14/2016 1021   ALBUMIN 3.8 12/06/2019 1545   ALBUMIN 4.0 11/14/2016 1021   AST 24 12/06/2019 1545   AST 18 11/18/2017 0950   AST 17 11/14/2016 1021   ALT 17 12/06/2019 1545   ALT 13 11/18/2017 0950   ALT 13 11/14/2016 1021   ALKPHOS 69 12/06/2019 1545   ALKPHOS 96 11/14/2016 1021   BILITOT 1.5 (H) 12/06/2019 1545   BILITOT  0.7 11/18/2017 0950   BILITOT 1.25 (H) 11/14/2016 1021   GFRNONAA 42 (L) 12/06/2019 1545   GFRNONAA 34 (L) 11/18/2017 0950   GFRAA 49 (L) 12/06/2019 1545   GFRAA 40 (L) 11/18/2017 0950    Assessment: 1.  Generalized abdominal pain: Patient has had this chronic pain for years, it is somewhat worse now recurring 2 types of with one previously this is 3-4 times a year, helped by Hyoscyamine; most likely IBS+/-diverticular spasm as discussed in the past 2.  Epigastric pain: When laying down to sleep at night, reflux and gastritis have been discussed in the past and patient tells me she was on Omeprazole but had "a bad reaction" 3. Diarrhea: occasionally after eating; consider relation to IBS.  Plan: 1.  Discussed the low FODMAP diet.  Provided her with a handout. 2.  Refilled Hyoscyamine 1-2 tabs every 4-6 hours as needed for abdominal pain.  Discussed that if she has pain which is not stopped after 20 minutes from taking her first pill she can try a second.  Refills for a year. 3.  Discussed trying Tums or Pepcid when she lays down at night and has epigastric discomfort. 4.  Discussed  using Imodium if she starts with times of diarrhea and needs to go somewhere. Also discussed we could trial scheduling Hyoscymaine in the future 5.  Patient to follow in clinic with me in 3 months or sooner if necessary.  Ellouise Newer, PA-C [facility] 07/09/2020, 3:02 PM  Cc: Donald Prose, MD

## 2020-07-09 NOTE — Patient Instructions (Addendum)
If you are age 83 or older, your body mass index should be between 23-30. Your Body mass index is 29.98 kg/m. If this is out of the aforementioned range listed, please consider follow up with your Primary Care Provider.  If you are age 7 or younger, your body mass index should be between 19-25. Your Body mass index is 29.98 kg/m. If this is out of the aformentioned range listed, please consider follow up with your Primary Care Provider.   You Hyoscyamine has been refilled and sent to you pharmacy.  Start a Low [place] diet.  Try Tums/Pepcid for stomach pain at night when laying down.  Try Imodium OTC as directed if having diarrhea and you need to go somewhere.  Call the office in a few weeks to schedule a 3 month follow up with Ellouise Newer, PA-C.

## 2020-07-09 NOTE — Progress Notes (Signed)
Assessment and plan noted ?

## 2020-07-17 DIAGNOSIS — N2581 Secondary hyperparathyroidism of renal origin: Secondary | ICD-10-CM | POA: Diagnosis not present

## 2020-07-17 DIAGNOSIS — I10 Essential (primary) hypertension: Secondary | ICD-10-CM | POA: Diagnosis not present

## 2020-07-17 DIAGNOSIS — I5020 Unspecified systolic (congestive) heart failure: Secondary | ICD-10-CM | POA: Diagnosis not present

## 2020-07-17 DIAGNOSIS — N183 Chronic kidney disease, stage 3 unspecified: Secondary | ICD-10-CM | POA: Diagnosis not present

## 2020-07-17 DIAGNOSIS — I129 Hypertensive chronic kidney disease with stage 1 through stage 4 chronic kidney disease, or unspecified chronic kidney disease: Secondary | ICD-10-CM | POA: Diagnosis not present

## 2020-07-17 DIAGNOSIS — Z23 Encounter for immunization: Secondary | ICD-10-CM | POA: Diagnosis not present

## 2020-08-09 DIAGNOSIS — I5022 Chronic systolic (congestive) heart failure: Secondary | ICD-10-CM | POA: Diagnosis not present

## 2020-08-09 DIAGNOSIS — N183 Chronic kidney disease, stage 3 unspecified: Secondary | ICD-10-CM | POA: Diagnosis not present

## 2020-08-09 DIAGNOSIS — Z853 Personal history of malignant neoplasm of breast: Secondary | ICD-10-CM | POA: Diagnosis not present

## 2020-08-09 DIAGNOSIS — I129 Hypertensive chronic kidney disease with stage 1 through stage 4 chronic kidney disease, or unspecified chronic kidney disease: Secondary | ICD-10-CM | POA: Diagnosis not present

## 2020-08-24 DIAGNOSIS — I700 Atherosclerosis of aorta: Secondary | ICD-10-CM | POA: Diagnosis not present

## 2020-08-24 DIAGNOSIS — H9190 Unspecified hearing loss, unspecified ear: Secondary | ICD-10-CM | POA: Diagnosis not present

## 2020-08-24 DIAGNOSIS — Z23 Encounter for immunization: Secondary | ICD-10-CM | POA: Diagnosis not present

## 2020-08-24 DIAGNOSIS — I5022 Chronic systolic (congestive) heart failure: Secondary | ICD-10-CM | POA: Diagnosis not present

## 2020-08-24 DIAGNOSIS — N183 Chronic kidney disease, stage 3 unspecified: Secondary | ICD-10-CM | POA: Diagnosis not present

## 2020-08-24 DIAGNOSIS — Z1389 Encounter for screening for other disorder: Secondary | ICD-10-CM | POA: Diagnosis not present

## 2020-08-24 DIAGNOSIS — Z0000 Encounter for general adult medical examination without abnormal findings: Secondary | ICD-10-CM | POA: Diagnosis not present

## 2020-08-24 DIAGNOSIS — J309 Allergic rhinitis, unspecified: Secondary | ICD-10-CM | POA: Diagnosis not present

## 2020-08-24 DIAGNOSIS — I129 Hypertensive chronic kidney disease with stage 1 through stage 4 chronic kidney disease, or unspecified chronic kidney disease: Secondary | ICD-10-CM | POA: Diagnosis not present

## 2020-08-24 DIAGNOSIS — M85859 Other specified disorders of bone density and structure, unspecified thigh: Secondary | ICD-10-CM | POA: Diagnosis not present

## 2020-08-28 ENCOUNTER — Ambulatory Visit (INDEPENDENT_AMBULATORY_CARE_PROVIDER_SITE_OTHER): Payer: Medicare Other | Admitting: Cardiology

## 2020-08-28 ENCOUNTER — Other Ambulatory Visit: Payer: Self-pay

## 2020-08-28 ENCOUNTER — Encounter: Payer: Self-pay | Admitting: Cardiology

## 2020-08-28 VITALS — BP 150/90 | HR 77 | Ht 60.0 in | Wt 153.8 lb

## 2020-08-28 DIAGNOSIS — Z79899 Other long term (current) drug therapy: Secondary | ICD-10-CM | POA: Diagnosis not present

## 2020-08-28 DIAGNOSIS — I700 Atherosclerosis of aorta: Secondary | ICD-10-CM

## 2020-08-28 MED ORDER — ROSUVASTATIN CALCIUM 10 MG PO TABS: 10.0000 mg | ORAL_TABLET | Freq: Every day | ORAL | 11 refills | Status: DC

## 2020-08-28 NOTE — Patient Instructions (Signed)
Medication Instructions:  Please start Crestor 10 mg a day. Continue all other medications as listed.  *If you need a refill on your cardiac medications before your next appointment, please call your pharmacy*  Lab Work: Please have blood work in 3 months (Lipid/ALT)  If you have labs (blood work) drawn today and your tests are completely normal, you will receive your results only by: . MyChart Message (if you have MyChart) OR . A paper copy in the mail If you have any lab test that is abnormal or we need to change your treatment, we will call you to review the results.  Follow-Up: At [facility], you and your health needs are our priority.  As part of our continuing mission to provide you with exceptional heart care, we have created designated Provider Care Teams.  These Care Teams include your primary Cardiologist (physician) and Advanced Practice Providers (APPs -  Physician Assistants and Nurse Practitioners) who all work together to provide you with the care you need, when you need it.  We recommend signing up for the patient portal called "MyChart".  Sign up information is provided on this After Visit Summary.  MyChart is used to connect with patients for Virtual Visits (Telemedicine).  Patients are able to view lab/test results, encounter notes, upcoming appointments, etc.  Non-urgent messages can be sent to your provider as well.   To learn more about what you can do with MyChart, go to [email or web address].    Your next appointment:   6 month(s)  The format for your next appointment:   In Person  Provider:   Mark Skains, MD  Thank you for choosing [facility]!!     

## 2020-08-28 NOTE — Progress Notes (Signed)
Cardiology Office Note:    Date:  08/28/2020   ID:  Carla Bennett, DOB [birth year], MRN [id number]  PCP:  Donald Prose, MD  [facility] Cardiologist:  Candee Furbish, MD  [facility] Electrophysiologist:  None   Referring MD: Donald Prose, MD     History of Present Illness:    Carla Bennett is a 85 y.o. female here for the follow-up of systolic heart failure, cardiomyopathy previous chemotherapy for breast cancer, pericardial effusion.  In review of prior notes:  She had an echo performed on 03/13/15 which at that time showed no evidence of pericardial effusion. Mild mitral and aortic regurgitation noted. Normal ejection fraction. This was pre-chemotherapy to breast cancer.  Moderate-sized pericardial effusion was personally viewed on CT scan from 02/07/16. Aortic atherosclerosis and mild coronary artery atherosclerosis was also noted. Pleural effusions were also noted.   On her echocardiogram originally, her ejection fraction was markedly reduced at 20-25%. She had been treated with Adriamycin. This also showed moderate pericardial effusion.  A subsequent follow-up echo 1 month later showed ejection fraction of 35%. Improved. Effusion was trace.  She still has some GI issues for several years, ended up having a diet where she eliminated many fruits.  Dr. Henrene Pastor.  Overall today she is doing quite well without any fevers chills nausea vomiting syncope bleeding.  See below for details.  Past Medical History:  Diagnosis Date   Allergy    Aortic regurgitation    Arthritis    hands, neck   Basal cell carcinoma    Benign neoplasm of colon    Breast cancer of upper-outer quadrant of left female breast ([place]) 02/23/2015   4'16-left breast cancer-surgery, chemo, radiation-[name] follows every 3 months   Cataract    beginning stages-monitoring by MD   Cholelithiasis    resolved with surgery   Chronic systolic CHF (congestive heart failure) ([place]) 02/26/2016   A.  Echo  02/21/16:  Mild LVH, EF 20-25%, diff HK, Gr 3 DD, mod AI, mod to severe MR, mod LAE, mod redcued RVSF, mild RAE, mod TR, PASP 53 mmHg, small pericardial effusion, mod L pleural effusion  //  B. Echo 6/17:  Mild LVH EF 30-35%, diffuse HK, grade 1 diastolic dysfunction, trivial AI, MAC, mild LAE, normal RVSF, trivial pericardial effusion     Cystocele    Diverticulitis    Diverticulosis of colon (without mention of hemorrhage)    Gout    resolved -   Hearing loss    some hearing loss in right ear- no hearing aids   Heart murmur    Hematuria    negative urology work up   History of blood transfusion 1971   x 1 with D&C -    Hx of abnormal cervical Pap smear    LEEP-CIN I, negative margins and ECC   Hypercalcemia    Hypertension    Mitral regurgitation    Neuromuscular disorder (HCC)    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   OA (osteoarthritis) of hip    right   Other issue of medical certificates    uterine myomata   Parathyroid abnormality ([place])    being followed Dr. Barbette Hair   Radiation 10/01/15-11/16/15   left breast axillary and supraclavicular reigion 45 gray, left breast 50.4 gray, lumpectomy cavity boost 10 gray   Renal disease 3/16   Stage 3 kidney disease-seeing Dr Buddy Duty and Dr Marval Regal   Uterine prolaps    resolved after surgery   Vitamin  D deficiency     Past Surgical History:  Procedure Laterality Date   BREAST LUMPECTOMY Left 08/2015   CHOLECYSTECTOMY     COLONOSCOPY  01/18/2013   [name]   COLONOSCOPY N/A 06/26/2016   Procedure: COLONOSCOPY;  Surgeon: Irene Shipper, MD;  Location: [facility];  Service: Endoscopy;  Laterality: N/A;   CYSTOCELE REPAIR  08/2010   Rectocele, vault prolapse   DILATION AND CURETTAGE OF UTERUS     x2   PORT-A-CATH REMOVAL Right 08/23/2015   Procedure: REMOVAL PORT-A-CATH;  Surgeon: Erroll Luna, MD;  Location: [place];  Service: General;  Laterality: Right;   PORTACATH PLACEMENT Right  03/20/2015   Procedure: INSERTION PORT-A-CATH;  Surgeon: Erroll Luna, MD;  Location: [place];  Service: General;  Laterality: Right;   RADIOACTIVE SEED GUIDED PARTIAL MASTECTOMY WITH AXILLARY SENTINEL LYMPH NODE BIOPSY Left 08/23/2015   Procedure: LEFT BREAST PARTIAL MASTECTOMY WITH SENTINEL LYMPH NODE [place];  Surgeon: Erroll Luna, MD;  Location: [place];  Service: General;  Laterality: Left;   TONSILLECTOMY AND ADENOIDECTOMY     TOTAL VAGINAL HYSTERECTOMY     secondary to prolapse, ovaries not removed    Current Medications: Current Meds  Medication Sig   B Complex Vitamins (VITAMIN B COMPLEX PO) Take 1 tablet by mouth daily.    calcium carbonate (TUMS) 500 MG chewable tablet Chew 2 tablets (400 mg of elemental calcium total) by mouth 3 times/day as needed-between meals & bedtime for indigestion or heartburn.   carvedilol (COREG) 12.5 MG tablet Take 1 tablet (12.5 mg total) by mouth 2 (two) times daily with a meal. 1/2 tab in AM and 1 tab in PM   furosemide (LASIX) 20 MG tablet Take 1 tablet (20 mg total) by mouth daily.   hyoscyamine (LEVSIN) 0.125 MG tablet TAKE 1-2 TABLET EVERY 4-6 HOURS AS NEEDED FOR ABDOMINAL PAIN   lisinopril (ZESTRIL) 40 MG tablet Take 20 mg by mouth 2 (two) times daily.   loratadine (CLARITIN) 10 MG tablet Take 10 mg by mouth daily.   metroNIDAZOLE (METROCREAM) 0.75 % cream Apply 1 application topically daily.    spironolactone (ALDACTONE) 25 MG tablet TAKE 1/2 TABLET BY MOUTH EVERY DAY   triamcinolone cream (KENALOG) 0.1 % SMARTSIG:1 Application Topical 2-3 Times Daily   triamcinolone ointment (KENALOG) 0.1 % Apply 1 application topically 2 (two) times daily as needed (for itching).      Allergies:   Patient has no known allergies.   Social History   Socioeconomic History   Marital status: Widowed    Spouse name: Not on file   Number of children: 4   Years of education: Not on file   Highest education level: Not on  file  Occupational History   Occupation: retired  Tobacco Use   Smoking status: Never Smoker   Smokeless tobacco: Never Used  Scientific laboratory technician Use: Never used  Substance and Sexual Activity   Alcohol use: Yes    Comment: occasionally  wine/beer   Drug use: No   Sexual activity: Never    Birth control/protection: Surgical    Comment: Vaginal hysterectomy  Other Topics Concern   Not on file  Social History Narrative   Not on file   Social Determinants of Health   Financial Resource Strain:    Difficulty of Paying Living Expenses: Not on file  Food Insecurity:    Worried About [place] in the Last Year: Not on file   Ran  Out of Food in the Last Year: Not on file  Transportation Needs:    Lack of Transportation (Medical): Not on file   Lack of Transportation (Non-Medical): Not on file  Physical Activity:    Days of Exercise per Week: Not on file   Minutes of Exercise per Session: Not on file  Stress:    Feeling of Stress : Not on file  Social Connections:    Frequency of Communication with Friends and Family: Not on file   Frequency of Social Gatherings with Friends and Family: Not on file   Attends Religious Services: Not on file   Active Member of Clubs or Organizations: Not on file   Attends Archivist Meetings: Not on file   Marital Status: Not on file     Family History: The patient's family history includes Alzheimer's disease in her mother; Breast cancer (age of onset: 74) in her sister; Cancer in her maternal uncle; Cancer (age of onset: 61) in her cousin and maternal uncle; Lung cancer in her mother; Lung cancer (age of onset: 57) in her father; Multiple sclerosis in her sister; Parkinson's disease in her mother; Prostate cancer (age of onset: 63) in her maternal uncle. There is no history of Colon cancer, Esophageal cancer, Rectal cancer, Stomach cancer, or Colon polyps.  ROS:   Please see the history of present  illness.    Denies any fevers chills nausea vomiting syncope bleeding all other systems reviewed and are negative.  EKGs/Labs/Other Studies Reviewed:    Recent Labs: 12/06/2019: ALT 17; BUN 14; Creatinine, Ser 1.19; Hemoglobin 15.4; Platelets 326; Potassium 4.0; Sodium 139  Recent Lipid Panel No results found for: CHOL, TRIG, HDL, CHOLHDL, VLDL, LDLCALC, LDLDIRECT  Physical Exam:    VS:  BP (!) 150/90    Pulse 77    Ht 5' (1.524 m)    Wt 153 lb 12.8 oz (69.8 kg)    LMP 10/20/1996    SpO2 99%    BMI 30.04 kg/m     Wt Readings from Last 3 Encounters:  08/28/20 153 lb 12.8 oz (69.8 kg)  07/09/20 153 lb 8 oz (69.6 kg)  02/10/20 155 lb (70.3 kg)     GEN: Well nourished, well developed in no acute distress HEENT: Normal NECK: No JVD; No carotid bruits LYMPHATICS: No lymphadenopathy CARDIAC: RRR, no murmurs, rubs, gallops RESPIRATORY:  Clear to auscultation without rales, wheezing or rhonchi  ABDOMEN: Soft, non-tender, non-distended MUSCULOSKELETAL:  No edema; No deformity  SKIN: Warm and dry NEUROLOGIC:  Alert and oriented x 3 PSYCHIATRIC:  Normal affect   ASSESSMENT:    1. Long-term use of high-risk medication   2. Aortic atherosclerosis (HCC)    PLAN:    In order of problems listed above:  Pericardial effusion -Resolved.  Previously seen on CT scan in 2017.  Chronic systolic heart failure -NYHA class I doing well with EF improved 50%  Aortic atherosclerosis --Starting Crestor 36m PO QD. personally reviewed CT scan which demonstrated aortic atherosclerosis.  Agree with Dr. SNancy Fetteron statin use.   Essential hypertension --Dr. CMarval Regalhas been watching.  --Taking full dose lasix now, less lower extremity edema.  Blood pressure has improved. -Also on lisinopril 40, spironolactone 12.5 and carvedilol 12.5 with half tab in the a.m. and 1 tab in the p.m.     Shared Decision Making/Informed Consent      Medication Adjustments/Labs and Tests Ordered: Current  medicines are reviewed at length with the patient today.  Concerns regarding  medicines are outlined above.  Orders Placed This Encounter  Procedures   Lipid panel   ALT   Meds ordered this encounter  Medications   rosuvastatin (CRESTOR) 10 MG tablet    Sig: Take 1 tablet (10 mg total) by mouth daily.    Dispense:  30 tablet    Refill:  11    Patient Instructions  Medication Instructions:  Please start Crestor 10 mg a day. Continue all other medications as listed.  *If you need a refill on your cardiac medications before your next appointment, please call your pharmacy*  Lab Work: Please have blood work in 3 months (Lipid/ALT)  If you have labs (blood work) drawn today and your tests are completely normal, you will receive your results only by:  [place] (if you have MyChart) OR  A paper copy in the mail If you have any lab test that is abnormal or we need to change your treatment, we will call you to review the results.  Follow-Up: At [facility], you and your health needs are our priority.  As part of our continuing mission to provide you with exceptional heart care, we have created designated Provider Care Teams.  These Care Teams include your primary Cardiologist (physician) and Advanced Practice Providers (APPs -  Physician Assistants and Nurse Practitioners) who all work together to provide you with the care you need, when you need it.  We recommend signing up for the patient portal called "MyChart".  Sign up information is provided on this After Visit Summary.  MyChart is used to connect with patients for Virtual Visits (Telemedicine).  Patients are able to view lab/test results, encounter notes, upcoming appointments, etc.  Non-urgent messages can be sent to your provider as well.   To learn more about what you can do with MyChart, go to [email or web address].    Your next appointment:   6 month(s)  The format for your next appointment:   In  Person  Provider:   Candee Furbish, MD   Thank you for choosing [facility]!!         Signed, Candee Furbish, MD  08/28/2020 3:37 PM    [place]

## 2020-10-01 DIAGNOSIS — E069 Thyroiditis, unspecified: Secondary | ICD-10-CM | POA: Diagnosis not present

## 2020-10-01 DIAGNOSIS — H903 Sensorineural hearing loss, bilateral: Secondary | ICD-10-CM | POA: Diagnosis not present

## 2020-10-03 ENCOUNTER — Telehealth: Payer: Self-pay | Admitting: Cardiology

## 2020-10-03 NOTE — Telephone Encounter (Signed)
Pt c/o medication issue:  1. Name of Medication: rosuvastatin (CRESTOR) 10 MG tablet  2. How are you currently taking this medication (dosage and times per day)? Pt is now taking the medication 2x weekly  3. Are you having a reaction (difficulty breathing--STAT)? Muscle weakness  4. What is your medication issue?    Ernst Bowler, Clinical pharmacist for Dr. Lynnda Child office met with the patient to discuss the patient's medications. At this meeting the patient told the pharmacist that she has had extreme myalgias . The patient wanted to stop taking the medication completely, but both the Pharmacist and Dr. Nancy Fetter  decided that it would be better to reduce the dosage of the medication to 2x weekly.  Lew Dawes, the Clinical Pharmacist Assistant at Dr. Lynnda Child office wanted to make Dr. Marlou Porch aware of the medication change.   Please reach out to [facility] if the office needs to discuss this change in more detail.

## 2020-10-04 DIAGNOSIS — I5022 Chronic systolic (congestive) heart failure: Secondary | ICD-10-CM | POA: Diagnosis not present

## 2020-10-04 DIAGNOSIS — Z853 Personal history of malignant neoplasm of breast: Secondary | ICD-10-CM | POA: Diagnosis not present

## 2020-10-04 DIAGNOSIS — N183 Chronic kidney disease, stage 3 unspecified: Secondary | ICD-10-CM | POA: Diagnosis not present

## 2020-10-04 DIAGNOSIS — I129 Hypertensive chronic kidney disease with stage 1 through stage 4 chronic kidney disease, or unspecified chronic kidney disease: Secondary | ICD-10-CM | POA: Diagnosis not present

## 2020-10-04 NOTE — Telephone Encounter (Signed)
OK to stop Crestor 10mg  for 2 weeks to see if muscle cramps resolve.  Please have her see our lipid clinic as well.   Candee Furbish, MD

## 2020-10-05 NOTE — Telephone Encounter (Signed)
Spoke with pt.  She has been holding Crestor since Monday and her pain has basically completely resolved.  She would like to try taking Crestor X 2 a week as instructed by Ernst Bowler - pharmacist at Dr Lynnda Child office.  If she is unable to tolerate this she will try to f/u there since she lives so much closer.  If she continues to have concerns r/t this and would like to be seen in our [facility]. She will call back to let us know.  Pt was vert grateful for the call back and information.

## 2020-10-05 NOTE — Telephone Encounter (Signed)
Pt is returning call.  

## 2020-10-05 NOTE — Telephone Encounter (Signed)
Left message to c/b to discuss holding Crestor X 2 weeks and seeing Lipid Clinic.

## 2020-10-09 ENCOUNTER — Telehealth: Payer: Self-pay | Admitting: Hematology and Oncology

## 2020-10-09 NOTE — Telephone Encounter (Signed)
Rescheduled appointment due to provider PAL. Patient is aware of changes.

## 2020-11-13 ENCOUNTER — Ambulatory Visit: Payer: Medicare Other | Admitting: Hematology and Oncology

## 2020-11-20 ENCOUNTER — Ambulatory Visit: Payer: Medicare Other | Admitting: Hematology and Oncology

## 2020-11-27 ENCOUNTER — Telehealth: Payer: Self-pay | Admitting: Hematology and Oncology

## 2020-11-27 NOTE — Telephone Encounter (Signed)
Rescheduled from 2/9 per provider. Called pt and left a msg

## 2020-11-28 ENCOUNTER — Inpatient Hospital Stay: Payer: Medicare Other | Admitting: Hematology and Oncology

## 2020-11-29 ENCOUNTER — Other Ambulatory Visit: Payer: Self-pay

## 2020-11-29 ENCOUNTER — Other Ambulatory Visit: Payer: Medicare Other

## 2020-11-29 DIAGNOSIS — I700 Atherosclerosis of aorta: Secondary | ICD-10-CM | POA: Diagnosis not present

## 2020-11-29 DIAGNOSIS — Z79899 Other long term (current) drug therapy: Secondary | ICD-10-CM

## 2020-11-29 LAB — LIPID PANEL
Chol/HDL Ratio: 2.6 ratio (ref 0.0–4.4)
Cholesterol, Total: 140 mg/dL (ref 100–199)
HDL: 54 mg/dL (ref >39)
LDL Chol Calc (NIH): 64 mg/dL (ref 0–99)
Triglycerides: 127 mg/dL (ref 0–149)
VLDL Cholesterol Cal: 22 mg/dL (ref 5–40)

## 2020-11-29 LAB — ALT: ALT: 19 IU/L (ref 0–32)

## 2020-11-30 ENCOUNTER — Encounter: Payer: Self-pay | Admitting: *Deleted

## 2020-12-05 NOTE — Assessment & Plan Note (Signed)
Left breast invasive ductal carcinoma grade 3 ER 0% PR 0% HER-2 negative Ki-67 75%, 2.9 cm mass with microcalcifications plus abnormal lymph nodes biopsy-proven to be breast cancer T2 N1 M0 stage IIB clinical stage S/P Neoadjuvant Dose dense AC folll by Abraxane X 12  left lumpectomy 08/23/2015 : Invasive ductal carcinoma grade 3, 2.8 cm, associated extensive DCIS with comedonecrosis, margins negative,1/2 sentinel nodes positive, ER 0%, PR 0% T2 N1 M0 stage IIB Completed radiation therapy 11/16/2015  I offered the patient adjuvant Xeloda But she refused.  Hypercalcemia: Ultrasound of the neck had revealed a parathyroid adenoma. Back pain:Bone scan2/5/18: Normal Fatigue related to prior chemotherapy:Much improved  Breast Cancer Surveillance: 1. Breast exam2/16/2022:Benign 2. Mammogram7/27/2021at [facility]: Benign 3.Bone density 04/30/2017 at [facility]: T score -0.7: Normal  Return to clinic in1 yr

## 2020-12-05 NOTE — Progress Notes (Signed)
Patient Care Team: Donald Prose, MD as PCP - General (Family Medicine) Jerline Pain, MD as PCP - Cardiology (Cardiology) Erroll Luna, MD as Consulting Physician (General Surgery) Nicholas Lose, MD as Consulting Physician (Hematology and Oncology) Gery Pray, MD as Consulting Physician (Radiation Oncology) Mauro Kaufmann, RN as Registered Nurse Rockwell Germany, RN as Registered Nurse Sylvan Cheese, NP as Nurse Practitioner (Hematology and Oncology)  DIAGNOSIS:    ICD-10-CM   1. Malignant neoplasm of upper-outer quadrant of left breast in female, estrogen receptor negative ([place])  C50.412    Z17.1     SUMMARY OF ONCOLOGIC HISTORY: Oncology History  Breast cancer of upper-outer quadrant of left female breast ([place])  02/15/2015 Mammogram   Left breast increased density, mass is irregular with microcalcifications measuring 2.9 cm, cyst in the breast as well as abnormal enlarged lymph nodes   02/20/2015 Initial Diagnosis   Left breast biopsy: Invasive ductal carcinoma with DCIS, axillary lymph node biopsy positive, ER 0%, PR 0%, HER-2 negative ratio 1.13 with Ki-67 75%   03/08/2015 Procedure   OvaNext panel [name]) reveals no clinically significant variant at ATM, BARD1, BRCA1, BRCA2, BRIP1, CDH1, CHEK2, EPCAM, MLH1, MRE11A, MSH2, MSH6, MUTYH, NBN, NF1, PALB2, PMS2, PTEN, RAD50, RAD51C, RAD51D, SMARCA4, STK11, and TP53.    03/15/2015 PET scan   2.8 cm solid irregular left breast mass which is hypermetabolic and consistent with known left breast cancer. 2. Weakly positive axillary lymph nodes or equivocal   03/20/2015 Breast MRI   3.1 cm irregular enhancing mass located within the left breast at the 1 o'clock position; 1.6 cm Left axillary LN   03/20/2015 Clinical Stage   Stage IIB: T2 N1   03/22/2015 - 08/02/2015 Neo-Adjuvant Chemotherapy   Neo-adjuvant chemotherapy with dose dense Adriamycin and Cytoxan 4 followed by Abraxane weekly 12   08/06/2015 Breast MRI    Left breast now measures 0.9 x 0.8 x 1.9 cm (previously measured 3.0 x 3.1 x 1.8 cm). Dec size of abnormal Left axill LN   08/23/2015 Surgery   Left lumpectomy: Invasive ductal carcinoma grade 3, 2.8 cm, associated extensive DCIS with comedonecrosis, margins negative,1/2 sentinel nodes positive, ER 0%, PR 0%, HER2/neu repeated and remains negative (ratio 1.13)   08/23/2015 Pathologic Stage   Stage IIB: ypT2 ypN1   10/01/2015 - 11/16/2015 Radiation Therapy   Adjuvant RT: Left breast axillary and supraclavicular region, 45 gray in 25 fractions, the left breast received 3 additional treatments for a cumulative dose of 50.4 gray. Lumpectomy cavity boost 10 gray in 5 fractions   12/27/2015 Survivorship   Survivorship visit completed and copy of care plan provided to patient     CHIEF COMPLIANT: Surveillance oftriple negativebreast cancer  INTERVAL HISTORY: Carla Bennett is a 86 y.o. with above-mentioned history of triple negativeleft breast cancer treated with neoadjuvant chemotherapy, lumpectomy,adjuvant radiation, and who is currently on surveillance.Mammogram on 05/15/20 showed no evidence of malignancy bilaterally. She presents to the clinic todayfor annual follow-up.    ALLERGIES:  has No Known Allergies.  MEDICATIONS:  Current Outpatient Medications  Medication Sig Dispense Refill  . B Complex Vitamins (VITAMIN B COMPLEX PO) Take 1 tablet by mouth daily.     . calcium carbonate (TUMS) 500 MG chewable tablet Chew 2 tablets (400 mg of elemental calcium total) by mouth 3 times/day as needed-between meals & bedtime for indigestion or heartburn.    . carvedilol (COREG) 12.5 MG tablet Take 1 tablet (12.5 mg total) by mouth 2 (two) times  daily with a meal. 1/2 tab in AM and 1 tab in PM    . furosemide (LASIX) 20 MG tablet Take 1 tablet (20 mg total) by mouth daily. 30 tablet 6  . hyoscyamine (LEVSIN) 0.125 MG tablet TAKE 1-2 TABLET EVERY 4-6 HOURS AS NEEDED FOR ABDOMINAL PAIN 90 tablet 3   . lisinopril (ZESTRIL) 40 MG tablet Take 20 mg by mouth 2 (two) times daily.    Marland Kitchen loratadine (CLARITIN) 10 MG tablet Take 10 mg by mouth daily.    . metroNIDAZOLE (METROCREAM) 0.75 % cream Apply 1 application topically daily.     . rosuvastatin (CRESTOR) 10 MG tablet Take 1 tablet (10 mg total) by mouth daily. (Patient taking differently: Take 10 mg by mouth 2 (two) times a week. As ordered by Dr Nancy Fetter) 30 tablet 11  . spironolactone (ALDACTONE) 25 MG tablet TAKE 1/2 TABLET BY MOUTH EVERY DAY 45 tablet 1  . triamcinolone cream (KENALOG) 0.1 % SMARTSIG:1 Application Topical 2-3 Times Daily    . triamcinolone ointment (KENALOG) 0.1 % Apply 1 application topically 2 (two) times daily as needed (for itching).      No current facility-administered medications for this visit.    PHYSICAL EXAMINATION: ECOG PERFORMANCE STATUS: 1 - Symptomatic but completely ambulatory  There were no vitals filed for this visit. There were no vitals filed for this visit.  BREAST: No palpable masses or nodules in either right or left breasts. No palpable axillary supraclavicular or infraclavicular adenopathy no breast tenderness or nipple discharge. (exam performed in the presence of a chaperone)  LABORATORY DATA:  I have reviewed the data as listed CMP Latest Ref Rng & Units 11/29/2020 12/06/2019 03/08/2019  Glucose 70 - 99 mg/dL - 103(H) 80  BUN 8 - 23 mg/dL - 14 16  Creatinine 0.44 - 1.00 mg/dL - 1.19(H) 1.22(H)  Sodium 135 - 145 mmol/L - 139 139  Potassium 3.5 - 5.1 mmol/L - 4.0 4.9  Chloride 98 - 111 mmol/L - 107 100  CO2 22 - 32 mmol/L - 21(L) 23  Calcium 8.9 - 10.3 mg/dL - 10.2 10.6(H)  Total Protein 6.5 - 8.1 g/dL - 6.6 -  Total Bilirubin 0.3 - 1.2 mg/dL - 1.5(H) -  Alkaline Phos 38 - 126 U/L - 69 -  AST 15 - 41 U/L - 24 -  ALT 0 - 32 IU/L 19 17 -    Lab Results  Component Value Date   WBC 15.4 (H) 12/06/2019   HGB 15.4 (H) 12/06/2019   HCT 47.2 (H) 12/06/2019   MCV 91.1 12/06/2019   PLT 326  12/06/2019   NEUTROABS 13.1 (H) 12/06/2019    ASSESSMENT & PLAN:  Breast cancer of upper-outer quadrant of left female breast Left breast invasive ductal carcinoma grade 3 ER 0% PR 0% HER-2 negative Ki-67 75%, 2.9 cm mass with microcalcifications plus abnormal lymph nodes biopsy-proven to be breast cancer T2 N1 M0 stage IIB clinical stage S/P Neoadjuvant Dose dense AC folll by Abraxane X 12  left lumpectomy 08/23/2015 : Invasive ductal carcinoma grade 3, 2.8 cm, associated extensive DCIS with comedonecrosis, margins negative,1/2 sentinel nodes positive, ER 0%, PR 0% T2 N1 M0 stage IIB Completed radiation therapy 11/16/2015  I offered the patient adjuvant Xeloda But she refused.  Hypercalcemia: Ultrasound of the neck had revealed a parathyroid adenoma. Back pain:Bone scan2/5/18: Normal Fatigue related to prior chemotherapy:Much improved  Breast Cancer Surveillance: 1. Breast exam2/16/2022:Benign 2. Mammogram7/27/2021at [facility]: Benign 3.B[facility] density 04/30/2017 at [facility]: T score -0.7:  Normal  Return to clinic in1 yr    No orders of the defined types were placed in this encounter.  The patient has a good understanding of the overall plan. she agrees with it. she will call with any problems that may develop before the next visit here.  Total time spent: 20 mins including face to face time and time spent for planning, charting and coordination of care  Rulon Eisenmenger, MD, MPH 12/06/2020  I, Cloyde Reams Dorshimer, am acting as scribe for Dr. Nicholas Lose.  I have reviewed the above documentation for accuracy and completeness, and I agree with the above.

## 2020-12-06 ENCOUNTER — Inpatient Hospital Stay: Payer: Medicare Other | Attending: Hematology and Oncology | Admitting: Hematology and Oncology

## 2020-12-06 ENCOUNTER — Other Ambulatory Visit: Payer: Self-pay

## 2020-12-06 ENCOUNTER — Telehealth: Payer: Self-pay | Admitting: Hematology and Oncology

## 2020-12-06 DIAGNOSIS — Z853 Personal history of malignant neoplasm of breast: Secondary | ICD-10-CM | POA: Insufficient documentation

## 2020-12-06 DIAGNOSIS — D351 Benign neoplasm of parathyroid gland: Secondary | ICD-10-CM | POA: Insufficient documentation

## 2020-12-06 DIAGNOSIS — C50412 Malignant neoplasm of upper-outer quadrant of left female breast: Secondary | ICD-10-CM

## 2020-12-06 DIAGNOSIS — Z9221 Personal history of antineoplastic chemotherapy: Secondary | ICD-10-CM | POA: Diagnosis not present

## 2020-12-06 DIAGNOSIS — I5022 Chronic systolic (congestive) heart failure: Secondary | ICD-10-CM

## 2020-12-06 DIAGNOSIS — I420 Dilated cardiomyopathy: Secondary | ICD-10-CM | POA: Diagnosis not present

## 2020-12-06 DIAGNOSIS — Z923 Personal history of irradiation: Secondary | ICD-10-CM | POA: Diagnosis not present

## 2020-12-06 DIAGNOSIS — Z171 Estrogen receptor negative status [ER-]: Secondary | ICD-10-CM

## 2020-12-06 MED ORDER — ROSUVASTATIN CALCIUM 10 MG PO TABS: 10.0000 mg | ORAL_TABLET | ORAL | Status: DC

## 2020-12-06 MED ORDER — CARVEDILOL 12.5 MG PO TABS: 12.5000 mg | ORAL_TABLET | Freq: Two times a day (BID) | ORAL | Status: DC

## 2020-12-06 NOTE — Telephone Encounter (Signed)
Scheduled appts per 2/17 los. Gave pt a printout of AVS.  °

## 2020-12-15 ENCOUNTER — Emergency Department (HOSPITAL_BASED_OUTPATIENT_CLINIC_OR_DEPARTMENT_OTHER): Payer: Medicare Other

## 2020-12-15 ENCOUNTER — Emergency Department (HOSPITAL_BASED_OUTPATIENT_CLINIC_OR_DEPARTMENT_OTHER)
Admission: EM | Admit: 2020-12-15 | Discharge: 2020-12-15 | Disposition: A | Discharge status: Home / Self Care | Payer: Medicare Other | Attending: Emergency Medicine | Admitting: Emergency Medicine

## 2020-12-15 ENCOUNTER — Other Ambulatory Visit: Payer: Self-pay

## 2020-12-15 ENCOUNTER — Encounter (HOSPITAL_BASED_OUTPATIENT_CLINIC_OR_DEPARTMENT_OTHER): Payer: Self-pay | Admitting: *Deleted

## 2020-12-15 DIAGNOSIS — I5022 Chronic systolic (congestive) heart failure: Secondary | ICD-10-CM | POA: Insufficient documentation

## 2020-12-15 DIAGNOSIS — Z79899 Other long term (current) drug therapy: Secondary | ICD-10-CM | POA: Insufficient documentation

## 2020-12-15 DIAGNOSIS — I130 Hypertensive heart and chronic kidney disease with heart failure and stage 1 through stage 4 chronic kidney disease, or unspecified chronic kidney disease: Secondary | ICD-10-CM | POA: Insufficient documentation

## 2020-12-15 DIAGNOSIS — N183 Chronic kidney disease, stage 3 unspecified: Secondary | ICD-10-CM | POA: Insufficient documentation

## 2020-12-15 DIAGNOSIS — R059 Cough, unspecified: Secondary | ICD-10-CM | POA: Insufficient documentation

## 2020-12-15 DIAGNOSIS — Z853 Personal history of malignant neoplasm of breast: Secondary | ICD-10-CM | POA: Insufficient documentation

## 2020-12-15 DIAGNOSIS — R0989 Other specified symptoms and signs involving the circulatory and respiratory systems: Secondary | ICD-10-CM | POA: Insufficient documentation

## 2020-12-15 DIAGNOSIS — R0981 Nasal congestion: Secondary | ICD-10-CM | POA: Insufficient documentation

## 2020-12-15 MED ORDER — BENZONATATE 100 MG PO CAPS: 100.0000 mg | ORAL_CAPSULE | Freq: Three times a day (TID) | ORAL | 0 refills | Status: DC

## 2020-12-15 MED ORDER — DOXYCYCLINE HYCLATE 100 MG PO CAPS: 100.0000 mg | ORAL_CAPSULE | Freq: Two times a day (BID) | ORAL | 0 refills | Status: DC

## 2020-12-15 NOTE — Discharge Instructions (Signed)
Please read and follow all provided instructions.  Your diagnoses today include:  1. Cough     Tests performed today include:  Chest x-ray -- shows question of mild pneumonia in right lower lobe  Vital signs. See below for your results today.   Medications prescribed:   Doxycycline - antibiotic  You have been prescribed an antibiotic medicine: take the entire course of medicine even if you are feeling better. Stopping early can cause the antibiotic not to work.   Tessalon Perles - cough suppressant medication  Take any prescribed medications only as directed.  Home care instructions:  Follow any educational materials contained in this packet.  Take the complete course of antibiotics that you were prescribed.   BE VERY CAREFUL not to take multiple medicines containing Tylenol (also called acetaminophen). Doing so can lead to an overdose which can damage your liver and cause liver failure and possibly death.   Follow-up instructions: Please follow-up with your primary care provider in the next 3 days for further evaluation of your symptoms and to ensure resolution of your infection.   Return instructions:   Please return to the Emergency Department if you experience worsening symptoms.   Return immediately with worsening breathing, worsening shortness of breath, or if you feel it is taking you more effort to breathe.   Please return if you have any other emergent concerns.  Additional Information:  Your vital signs today were: BP 139/86 (BP Location: Right Arm)   Pulse 71   Temp 98.2 F (36.8 C) (Tympanic)   Resp 20   Ht 5' (1.524 m)   Wt 70 kg   LMP 10/20/1996   SpO2 97%   BMI 30.14 kg/m  If your blood pressure (BP) was elevated above 135/85 this visit, please have this repeated by your doctor within one month. --------------

## 2020-12-15 NOTE — ED Triage Notes (Addendum)
Pt reports cough and congestion since earlier this week. Pt reports persistent cough. Denies fever, endorses chills. She is fully vaccinated against covid. States earlier in the week she had "a lot of phlegm" which has cleared up

## 2020-12-15 NOTE — ED Provider Notes (Signed)
[facility] EMERGENCY DEPARTMENT Provider Note   CSN: [id number] Arrival date & time: 12/15/20  1439     History Chief Complaint  Patient presents with  . Cough    Carla Bennett is a 86 y.o. female.  Patient presents emergency department for evaluation of cough and chest congestion that has been ongoing for approximately 5 days.  Patient has had frequent coughing spells.  She denies any fevers but reports having significant chills over the past 3 days.  She also has had a runny nose without sinus pressure.  No shortness of breath.  She does have a history of heart failure she states due to previous chemo, but denies worsening swelling in her legs or difficulty with lying flat.  She continues to take furosemide.  She had a more productive cough at onset, now improving.  No hemoptysis.  She is fully vaccinated against COVID and denies any sick contacts.  She took it at home test 2 days ago which was negative.        Past Medical History:  Diagnosis Date  . Allergy   . Aortic regurgitation   . Arthritis    hands, neck  . Basal cell carcinoma   . Benign neoplasm of colon   . Breast cancer of upper-outer quadrant of left female breast ([place]) 02/23/2015   4'16-left breast cancer-surgery, chemo, radiation-[name] follows every 3 months  . Cataract    beginning stages-monitoring by MD  . Cholelithiasis    resolved with surgery  . Chronic systolic CHF (congestive heart failure) ([place]) 02/26/2016   A.  Echo 02/21/16:  Mild LVH, EF 20-25%, diff HK, Gr 3 DD, mod AI, mod to severe MR, mod LAE, mod redcued RVSF, mild RAE, mod TR, PASP 53 mmHg, small pericardial effusion, mod L pleural effusion  //  B. Echo 6/17:  Mild LVH EF 30-35%, diffuse HK, grade 1 diastolic dysfunction, trivial AI, MAC, mild LAE, normal RVSF, trivial pericardial effusion    . Cystocele   . Diverticulitis   . Diverticulosis of colon (without mention of hemorrhage)   . Gout    resolved -  . Hearing loss     some hearing loss in right ear- no hearing aids  . Heart murmur   . Hematuria    negative urology work up  . History of blood transfusion 1971   x 1 with D&C -   . Hx of abnormal cervical Pap smear    LEEP-CIN I, negative margins and ECC  . Hypercalcemia   . Hypertension   . Mitral regurgitation   . 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  . OA (osteoarthritis) of hip    right  . Other issue of medical certificates    uterine myomata  . Parathyroid abnormality ([facility])    being followed Dr. Barbette Hair  . Radiation 10/01/15-11/16/15   left breast axillary and supraclavicular reigion 45 gray, left breast 50.4 gray, lumpectomy cavity boost 10 gray  . Renal disease 3/16   Stage 3 kidney disease-seeing Dr Buddy Duty and Dr Marval Regal  . Uterine prolaps    resolved after surgery  . Vitamin D deficiency     Patient Active Problem List   Diagnosis Date Noted  . Diverticulosis of colon without hemorrhage   . AP (abdominal pain)   . Abnormal CT of the abdomen   . Chronic systolic CHF (congestive heart failure) ([place]) 02/26/2016  . Dilated cardiomyopathy ([place]) 02/26/2016  . Pericardial  effusion 02/26/2016  . CKD (chronic kidney disease) 02/26/2016  . Essential hypertension 07/26/2015  . Central line complication 63/14/9702  . Sinusitis 06/07/2015  . Hypercalcemia 05/24/2015  . Family history of malignant neoplasm of gastrointestinal tract 03/08/2015  . Family history of malignant neoplasm of breast 03/08/2015  . Breast cancer of upper-outer quadrant of left female breast ([place]) 02/23/2015  . Hematochezia 01/06/2013  . Abdominal pain, other specified site 01/06/2013    Past Surgical History:  Procedure Laterality Date  . BREAST LUMPECTOMY Left 08/2015  . CHOLECYSTECTOMY    . COLONOSCOPY  01/18/2013   [name]  . COLONOSCOPY N/A 06/26/2016   Procedure: COLONOSCOPY;  Surgeon: Irene Shipper, MD;  Location: [facility];  Service: Endoscopy;  Laterality: N/A;  .  CYSTOCELE REPAIR  08/2010   Rectocele, vault prolapse  . DILATION AND CURETTAGE OF UTERUS     x2  . PORT-A-CATH REMOVAL Right 08/23/2015   Procedure: REMOVAL PORT-A-CATH;  Surgeon: Erroll Luna, MD;  Location: [facility];  Service: General;  Laterality: Right;  . PORTACATH PLACEMENT Right 03/20/2015   Procedure: INSERTION PORT-A-CATH;  Surgeon: Erroll Luna, MD;  Location: [facility];  Service: General;  Laterality: Right;  . RADIOACTIVE SEED GUIDED PARTIAL MASTECTOMY WITH AXILLARY SENTINEL LYMPH NODE BIOPSY Left 08/23/2015   Procedure: LEFT BREAST PARTIAL MASTECTOMY WITH SENTINEL LYMPH NODE MAPPING;  Surgeon: Erroll Luna, MD;  Location: [facility];  Service: General;  Laterality: Left;  . TONSILLECTOMY AND ADENOIDECTOMY    . TOTAL VAGINAL HYSTERECTOMY     secondary to prolapse, ovaries not removed     OB History    Gravida  6   Para  4   Term      Preterm      AB  2   Living  4     SAB  2   IAB      Ectopic      Multiple      Live Births              Family History  Problem Relation Age of Onset  . Lung cancer Father 69  . Parkinson's disease Mother   . Alzheimer's disease Mother        ? diag  . Lung cancer Mother   . Breast cancer Sister 19  . Multiple sclerosis Sister   . Prostate cancer Maternal Uncle 63  . Cancer Maternal Uncle        stomache  . Cancer Maternal Uncle 69       GI cancer - ? stomach  . Cancer Cousin 32       mat female first cousin (son of mat uncle with GI cancer) with ureter cancer  . Colon cancer Neg Hx   . Esophageal cancer Neg Hx   . Rectal cancer Neg Hx   . Stomach cancer Neg Hx   . Colon polyps Neg Hx     Social History   Tobacco Use  . Smoking status: Never Smoker  . Smokeless tobacco: Never Used  Vaping Use  . Vaping Use: Never used  Substance Use Topics  . Alcohol use: Yes    Comment: occasionally  wine/beer  . Drug use: No    Home Medications Prior to Admission medications    Medication Sig Start Date End Date Taking? Authorizing Provider  benzonatate (TESSALON) 100 MG capsule Take 1 capsule (100 mg total) by mouth every 8 (eight) hours. 12/15/20  Yes Carlisle Cater, PA-C  doxycycline (  VIBRAMYCIN) 100 MG capsule Take 1 capsule (100 mg total) by mouth 2 (two) times daily. 12/15/20  Yes Carlisle Cater, PA-C  B Complex Vitamins (VITAMIN B COMPLEX PO) Take 1 tablet by mouth daily.     [provider]  calcium carbonate (TUMS) 500 MG chewable tablet Chew 2 tablets (400 mg of elemental calcium total) by mouth 3 times/day as needed-between meals & bedtime for indigestion or heartburn. 11/17/18   Nicholas Lose, MD  carvedilol (COREG) 12.5 MG tablet Take 1 tablet (12.5 mg total) by mouth 2 (two) times daily with a meal. 1 tab in AM and 1 tab in PM 12/06/20   Nicholas Lose, MD  furosemide (LASIX) 20 MG tablet Take 1 tablet (20 mg total) by mouth daily. 01/12/17   Jerline Pain, MD  hyoscyamine (LEVSIN) 0.125 MG tablet TAKE 1-2 TABLET EVERY 4-6 HOURS AS NEEDED FOR ABDOMINAL PAIN 07/09/20   Levin Erp, PA  lisinopril (ZESTRIL) 40 MG tablet Take 20 mg by mouth 2 (two) times daily.    [provider]  loratadine (CLARITIN) 10 MG tablet Take 10 mg by mouth daily.    [provider]  metroNIDAZOLE (METROCREAM) 0.75 % cream Apply 1 application topically daily.  11/27/19   [provider]  rosuvastatin (CRESTOR) 10 MG tablet Take 1 tablet (10 mg total) by mouth 2 (two) times a week. As ordered by Dr Nancy Fetter 12/06/20 12/06/21  Nicholas Lose, MD  spironolactone (ALDACTONE) 25 MG tablet TAKE 1/2 TABLET BY MOUTH EVERY DAY 04/27/20   Jerline Pain, MD  triamcinolone cream (KENALOG) 0.1 % SMARTSIG:1 Application Topical 2-3 Times Daily 03/27/20   [provider]    Allergies    Patient has no known allergies.  Review of Systems   Review of Systems  Constitutional: Positive for chills. Negative for fatigue and fever.  HENT: Positive for congestion  and rhinorrhea. Negative for ear pain, sinus pressure and sore throat.   Eyes: Negative for redness.  Respiratory: Positive for cough. Negative for shortness of breath and wheezing.   Cardiovascular: Negative for leg swelling.  Gastrointestinal: Negative for abdominal pain, diarrhea, nausea and vomiting.  Genitourinary: Negative for dysuria.  Musculoskeletal: Negative for myalgias and neck stiffness.  Skin: Negative for rash.  Neurological: Negative for headaches.  Hematological: Negative for adenopathy.    Physical Exam Updated Vital Signs BP 139/86 (BP Location: Right Arm)   Pulse 71   Temp 98.2 F (36.8 C) (Tympanic)   Resp 20   Ht 5' (1.524 m)   Wt 70 kg   LMP 10/20/1996   SpO2 97%   BMI 30.14 kg/m   Physical Exam Vitals and nursing note reviewed.  Constitutional:      General: She is not in acute distress.    Appearance: She is well-developed.  HENT:     Head: Normocephalic and atraumatic.     Right Ear: Tympanic membrane, ear canal and external ear normal.     Left Ear: Tympanic membrane, ear canal and external ear normal.     Nose: Nose normal.     Mouth/Throat:     Mouth: Mucous membranes are moist.  Eyes:     Conjunctiva/sclera: Conjunctivae normal.  Cardiovascular:     Rate and Rhythm: Normal rate and regular rhythm.     Heart sounds: No murmur heard.   Pulmonary:     Effort: No respiratory distress.     Breath sounds: No wheezing, rhonchi or rales.  Abdominal:  Palpations: Abdomen is soft.     Tenderness: There is no abdominal tenderness. There is no guarding or rebound.  Musculoskeletal:     Cervical back: Normal range of motion and neck supple.     Right lower leg: No edema.     Left lower leg: No edema.  Skin:    General: Skin is warm and dry.     Findings: No rash.  Neurological:     General: No focal deficit present.     Mental Status: She is alert. Mental status is at baseline.     Motor: No weakness.  Psychiatric:        Mood and  Affect: Mood normal.     ED Results / Procedures / Treatments   Labs (all labs ordered are listed, but only abnormal results are displayed) Labs Reviewed - No data to display  EKG None  Radiology DG Chest 2 View  Result Date: 12/15/2020 CLINICAL DATA:  Cough for 3-4 days. EXAM: CHEST - 2 VIEW COMPARISON:  December 06, 2019 FINDINGS: Cardiomediastinal silhouette is normal. Mediastinal contours appear intact. Calcific atherosclerotic disease of the aorta. Possible subtle airspace consolidation versus atelectasis in the right lower lobe. Osseous structures are without acute abnormality. Soft tissues are grossly normal. IMPRESSION: Possible subtle airspace consolidation versus atelectasis in the right lower lobe. Electronically Signed   By: Fidela Salisbury M.D.   On: 12/15/2020 15:23    Procedures Procedures   Medications Ordered in ED Medications - No data to display  ED Course  I have reviewed the triage vital signs and the nursing notes.  Pertinent labs & imaging results that were available during my care of the patient were reviewed by me and considered in my medical decision making (see chart for details).  Patient seen and examined.  Overall she looks well.  Given persisting cough and question of pneumonia on chest x-ray, discussed treatment antibiotics.  Patient agrees to proceed.  No history of allergies.  Will give Tessalon for home.  Otherwise she can continue supportive care.  Vital signs reviewed and are as follows: BP 139/86 (BP Location: Right Arm)   Pulse 71   Temp 98.2 F (36.8 C) (Tympanic)   Resp 20   Ht 5' (1.524 m)   Wt 70 kg   LMP 10/20/1996   SpO2 97%   BMI 30.14 kg/m   I discussed plan with patient's son at bedside prior to discharge.  They are in agreement with plan.  Encouraged follow-up with PCP if symptoms or not improving by early to mid next week.  Encouraged return to the emergency department if she develops worsening shortness of breath,  persistent vomiting, high fevers, or other concerns.    MDM Rules/Calculators/A&P                          Patient with respiratory infection symptoms over the past several days, question of a faint right lower lobe pneumonia on chest x-ray.  I do not feel that the cough is related to a heart failure exacerbation at this time.  She appears well compensated.  She also does not appear septic and is in no distress.  Plan as above.    Final Clinical Impression(s) / ED Diagnoses Final diagnoses:  Cough    Rx / DC Orders ED Discharge Orders         Ordered    benzonatate (TESSALON) 100 MG capsule  Every 8 hours  12/15/20 1602    doxycycline (VIBRAMYCIN) 100 MG capsule  2 times daily        12/15/20 1602           Carlisle Cater, PA-C 12/15/20 [place], [place], DO 12/15/20 1710

## 2020-12-17 DIAGNOSIS — J189 Pneumonia, unspecified organism: Secondary | ICD-10-CM | POA: Diagnosis not present

## 2020-12-17 DIAGNOSIS — R059 Cough, unspecified: Secondary | ICD-10-CM | POA: Diagnosis not present

## 2020-12-17 DIAGNOSIS — I129 Hypertensive chronic kidney disease with stage 1 through stage 4 chronic kidney disease, or unspecified chronic kidney disease: Secondary | ICD-10-CM | POA: Diagnosis not present

## 2020-12-17 DIAGNOSIS — N1832 Chronic kidney disease, stage 3b: Secondary | ICD-10-CM | POA: Diagnosis not present

## 2020-12-18 ENCOUNTER — Encounter (HOSPITAL_BASED_OUTPATIENT_CLINIC_OR_DEPARTMENT_OTHER): Payer: Self-pay | Admitting: *Deleted

## 2020-12-18 ENCOUNTER — Emergency Department (HOSPITAL_BASED_OUTPATIENT_CLINIC_OR_DEPARTMENT_OTHER): Payer: Medicare Other

## 2020-12-18 ENCOUNTER — Other Ambulatory Visit: Payer: Self-pay

## 2020-12-18 ENCOUNTER — Emergency Department (HOSPITAL_BASED_OUTPATIENT_CLINIC_OR_DEPARTMENT_OTHER)
Admission: EM | Admit: 2020-12-18 | Discharge: 2020-12-18 | Disposition: A | Discharge status: Home / Self Care | Payer: Medicare Other | Attending: Emergency Medicine | Admitting: Emergency Medicine

## 2020-12-18 DIAGNOSIS — Y9301 Activity, walking, marching and hiking: Secondary | ICD-10-CM | POA: Insufficient documentation

## 2020-12-18 DIAGNOSIS — J40 Bronchitis, not specified as acute or chronic: Secondary | ICD-10-CM

## 2020-12-18 DIAGNOSIS — N183 Chronic kidney disease, stage 3 unspecified: Secondary | ICD-10-CM | POA: Diagnosis not present

## 2020-12-18 DIAGNOSIS — I5022 Chronic systolic (congestive) heart failure: Secondary | ICD-10-CM | POA: Insufficient documentation

## 2020-12-18 DIAGNOSIS — Z85038 Personal history of other malignant neoplasm of large intestine: Secondary | ICD-10-CM | POA: Diagnosis not present

## 2020-12-18 DIAGNOSIS — Z85828 Personal history of other malignant neoplasm of skin: Secondary | ICD-10-CM | POA: Diagnosis not present

## 2020-12-18 DIAGNOSIS — M898X1 Other specified disorders of bone, shoulder: Secondary | ICD-10-CM

## 2020-12-18 DIAGNOSIS — W01198A Fall on same level from slipping, tripping and stumbling with subsequent striking against other object, initial encounter: Secondary | ICD-10-CM | POA: Insufficient documentation

## 2020-12-18 DIAGNOSIS — Y92002 Bathroom of unspecified non-institutional (private) residence single-family (private) house as the place of occurrence of the external cause: Secondary | ICD-10-CM | POA: Insufficient documentation

## 2020-12-18 DIAGNOSIS — W19XXXA Unspecified fall, initial encounter: Secondary | ICD-10-CM

## 2020-12-18 DIAGNOSIS — R0789 Other chest pain: Secondary | ICD-10-CM | POA: Diagnosis not present

## 2020-12-18 DIAGNOSIS — M25519 Pain in unspecified shoulder: Secondary | ICD-10-CM | POA: Diagnosis not present

## 2020-12-18 DIAGNOSIS — M25512 Pain in left shoulder: Secondary | ICD-10-CM | POA: Diagnosis not present

## 2020-12-18 DIAGNOSIS — S0993XA Unspecified injury of face, initial encounter: Secondary | ICD-10-CM | POA: Diagnosis present

## 2020-12-18 DIAGNOSIS — Z853 Personal history of malignant neoplasm of breast: Secondary | ICD-10-CM | POA: Diagnosis not present

## 2020-12-18 DIAGNOSIS — S0083XA Contusion of other part of head, initial encounter: Secondary | ICD-10-CM | POA: Diagnosis not present

## 2020-12-18 DIAGNOSIS — I130 Hypertensive heart and chronic kidney disease with heart failure and stage 1 through stage 4 chronic kidney disease, or unspecified chronic kidney disease: Secondary | ICD-10-CM | POA: Diagnosis not present

## 2020-12-18 DIAGNOSIS — Z043 Encounter for examination and observation following other accident: Secondary | ICD-10-CM | POA: Diagnosis not present

## 2020-12-18 DIAGNOSIS — R55 Syncope and collapse: Secondary | ICD-10-CM | POA: Diagnosis not present

## 2020-12-18 DIAGNOSIS — Z79899 Other long term (current) drug therapy: Secondary | ICD-10-CM | POA: Insufficient documentation

## 2020-12-18 DIAGNOSIS — M546 Pain in thoracic spine: Secondary | ICD-10-CM | POA: Diagnosis not present

## 2020-12-18 DIAGNOSIS — M542 Cervicalgia: Secondary | ICD-10-CM | POA: Diagnosis not present

## 2020-12-18 DIAGNOSIS — R0781 Pleurodynia: Secondary | ICD-10-CM | POA: Diagnosis not present

## 2020-12-18 LAB — BASIC METABOLIC PANEL
Anion gap: 11 (ref 5–15)
BUN: 22 mg/dL (ref 8–23)
CO2: 24 mmol/L (ref 22–32)
Calcium: 10 mg/dL (ref 8.9–10.3)
Chloride: 103 mmol/L (ref 98–111)
Creatinine, Ser: 1.3 mg/dL — ABNORMAL HIGH (ref 0.44–1.00)
GFR, Estimated: 41 mL/min — ABNORMAL LOW (ref >60)
Glucose, Bld: 102 mg/dL — ABNORMAL HIGH (ref 70–99)
Potassium: 4 mmol/L (ref 3.5–5.1)
Sodium: 138 mmol/L (ref 135–145)

## 2020-12-18 LAB — CBC WITH DIFFERENTIAL/PLATELET
Abs Immature Granulocytes: 0.02 10*3/uL (ref 0.00–0.07)
Basophils Absolute: 0 10*3/uL (ref 0.0–0.1)
Basophils Relative: 0 %
Eosinophils Absolute: 0.1 10*3/uL (ref 0.0–0.5)
Eosinophils Relative: 1 %
HCT: 44.6 % (ref 36.0–46.0)
Hemoglobin: 14.9 g/dL (ref 12.0–15.0)
Immature Granulocytes: 0 %
Lymphocytes Relative: 18 %
Lymphs Abs: 1.8 10*3/uL (ref 0.7–4.0)
MCH: 29.4 pg (ref 26.0–34.0)
MCHC: 33.4 g/dL (ref 30.0–36.0)
MCV: 88 fL (ref 80.0–100.0)
Monocytes Absolute: 0.6 10*3/uL (ref 0.1–1.0)
Monocytes Relative: 6 %
Neutro Abs: 7.5 10*3/uL (ref 1.7–7.7)
Neutrophils Relative %: 75 %
Platelets: 305 10*3/uL (ref 150–400)
RBC: 5.07 MIL/uL (ref 3.87–5.11)
RDW: 12.3 % (ref 11.5–15.5)
WBC: 10 10*3/uL (ref 4.0–10.5)
nRBC: 0 % (ref 0.0–0.2)

## 2020-12-18 LAB — TROPONIN I (HIGH SENSITIVITY): Troponin I (High Sensitivity): 6 ng/L (ref <18)

## 2020-12-18 MED ORDER — HYDROCODONE-ACETAMINOPHEN 5-325 MG PO TABS: 2.0000 | ORAL_TABLET | ORAL | 0 refills | Status: DC | PRN

## 2020-12-18 MED ORDER — LIDOCAINE 5 % EX PTCH: 1.0000 | MEDICATED_PATCH | CUTANEOUS | 0 refills | Status: DC

## 2020-12-18 MED ORDER — SODIUM CHLORIDE 0.9 % IV BOLUS
500.0000 mL | Freq: Once | INTRAVENOUS | Status: AC
  Administered 2020-12-18: 500 mL via INTRAVENOUS

## 2020-12-18 MED ORDER — LIDOCAINE 5 % EX PTCH
1.0000 | MEDICATED_PATCH | CUTANEOUS | Status: DC
  Administered 2020-12-18: 1 via TRANSDERMAL
  Filled 2020-12-18: qty 1

## 2020-12-18 MED ORDER — HYDROCODONE-ACETAMINOPHEN 5-325 MG PO TABS
1.0000 | ORAL_TABLET | Freq: Once | ORAL | Status: AC
  Administered 2020-12-18: 1 via ORAL
  Filled 2020-12-18: qty 1

## 2020-12-18 NOTE — ED Provider Notes (Signed)
[facility] EMERGENCY DEPARTMENT Provider Note   CSN: [id number] Arrival date & time: 12/18/20  1228     History Chief Complaint  Patient presents with  . Fall    Carla Bennett is a 86 y.o. female with remote history of breast cancer in remission, CHF, heart murmur, CKD who presents for evaluation of syncope.  This happened on Sunday, 2 days PTA.  She was seen here on Saturday for a cough.  Had a chest x-ray at that time which showed a right lower lobe pneumonia.  Patient states she woke up on Sunday and noted her doxycycline said to take on an empty stomach.  Patient states she normally cannot do this due to nausea and vomiting with pills on empty stomach however she "wanted the pills to work."  After taking the doxycycline on empty stomach she developed nausea and felt like she is going to have an episode of emesis.  She went to walk to the bathroom.  Had a few episodes of emesis, subsequently felt lightheaded and had a syncopal event.  She hit the right side of her face on toilet. She went to the dentist earlier today and actually needed 3 teeth to have work on due to breaking them from the fall.  She subsequently has had right upper back pain, over the scapula since the fall.  She is unsure if she hit this area.  States Tylenol does significantly help with the pain however does not completely resolve it.  Pain worse with movement. No pleuritic pain. States she still has a cough. No production. Vaccinated against [place].  She denies any headache, lightheadedness, dizziness, chest pain, shortness of breath, hemoptysis abdominal pain, diarrhea, dysuria, paresthesias, weakness.  Denies additional aggravating or alleviating factors  History obtained from patient and past medical records.  No interpreter used.  HPI     Past Medical History:  Diagnosis Date  . Allergy   . Aortic regurgitation   . Arthritis    hands, neck  . Basal cell carcinoma   . Benign neoplasm of colon   .  Breast cancer of upper-outer quadrant of left female breast ([place]) 02/23/2015   4'16-left breast cancer-surgery, chemo, radiation-[name] follows every 3 months  . Cataract    beginning stages-monitoring by MD  . Cholelithiasis    resolved with surgery  . Chronic systolic CHF (congestive heart failure) ([place]) 02/26/2016   A.  Echo 02/21/16:  Mild LVH, EF 20-25%, diff HK, Gr 3 DD, mod AI, mod to severe MR, mod LAE, mod redcued RVSF, mild RAE, mod TR, PASP 53 mmHg, small pericardial effusion, mod L pleural effusion  //  B. Echo 6/17:  Mild LVH EF 30-35%, diffuse HK, grade 1 diastolic dysfunction, trivial AI, MAC, mild LAE, normal RVSF, trivial pericardial effusion    . Cystocele   . Diverticulitis   . Diverticulosis of colon (without mention of hemorrhage)   . Gout    resolved -  . Hearing loss    some hearing loss in right ear- no hearing aids  . Heart murmur   . Hematuria    negative urology work up  . History of blood transfusion 1971   x 1 with D&C -   . Hx of abnormal cervical Pap smear    LEEP-CIN I, negative margins and ECC  . Hypercalcemia   . Hypertension   . Mitral regurgitation   . Neuromuscular disorder (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  Marland Kitchen  OA (osteoarthritis) of hip    right  . Other issue of medical certificates    uterine myomata  . Parathyroid abnormality (H[facility])    being followed Dr. Barbette Hair  . Radiation 10/01/15-11/16/15   left breast axillary and supraclavicular reigion 45 gray, left breast 50.4 gray, lumpectomy cavity boost 10 gray  . Renal disease 3/16   Stage 3 kidney disease-seeing Dr Buddy Duty and Dr Marval Regal  . Uterine prolaps    resolved after surgery  . Vitamin D deficiency     Patient Active Problem List   Diagnosis Date Noted  . Diverticulosis of colon without hemorrhage   . AP (abdominal pain)   . Abnormal CT of the abdomen   . Chronic systolic CHF (congestive heart failure) ([place]) 02/26/2016  . Dilated cardiomyopathy ([place]) 02/26/2016  .  Pericardial effusion 02/26/2016  . CKD (chronic kidney disease) 02/26/2016  . Essential hypertension 07/26/2015  . Central line complication 75/64/3329  . Sinusitis 06/07/2015  . Hypercalcemia 05/24/2015  . Family history of malignant neoplasm of gastrointestinal tract 03/08/2015  . Family history of malignant neoplasm of breast 03/08/2015  . Breast cancer of upper-outer quadrant of left female breast ([place]) 02/23/2015  . Hematochezia 01/06/2013  . Abdominal pain, other specified site 01/06/2013    Past Surgical History:  Procedure Laterality Date  . BREAST LUMPECTOMY Left 08/2015  . CHOLECYSTECTOMY    . COLONOSCOPY  01/18/2013   [name]  . COLONOSCOPY N/A 06/26/2016   Procedure: COLONOSCOPY;  Surgeon: Irene Shipper, MD;  Location: [facility];  Service: Endoscopy;  Laterality: N/A;  . CYSTOCELE REPAIR  08/2010   Rectocele, vault prolapse  . DILATION AND CURETTAGE OF UTERUS     x2  . PORT-A-CATH REMOVAL Right 08/23/2015   Procedure: REMOVAL PORT-A-CATH;  Surgeon: Erroll Luna, MD;  Location: [facility];  Service: General;  Laterality: Right;  . PORTACATH PLACEMENT Right 03/20/2015   Procedure: INSERTION PORT-A-CATH;  Surgeon: Erroll Luna, MD;  Location: [facility];  Service: General;  Laterality: Right;  . RADIOACTIVE SEED GUIDED PARTIAL MASTECTOMY WITH AXILLARY SENTINEL LYMPH NODE BIOPSY Left 08/23/2015   Procedure: LEFT BREAST PARTIAL MASTECTOMY WITH SENTINEL LYMPH NODE MAPPING;  Surgeon: Erroll Luna, MD;  Location: [facility];  Service: General;  Laterality: Left;  . TONSILLECTOMY AND ADENOIDECTOMY    . TOTAL VAGINAL HYSTERECTOMY     secondary to prolapse, ovaries not removed     OB History    Gravida  6   Para  4   Term      Preterm      AB  2   Living  4     SAB  2   IAB      Ectopic      Multiple      Live Births              Family History  Problem Relation Age of Onset  . Lung cancer Father 49  . Parkinson's disease  Mother   . Alzheimer's disease Mother        ? diag  . Lung cancer Mother   . Breast cancer Sister 53  . Multiple sclerosis Sister   . Prostate cancer Maternal Uncle 63  . Cancer Maternal Uncle        stomache  . Cancer Maternal Uncle 87       GI cancer - ? stomach  . Cancer Cousin 54       mat female first cousin (son  of mat uncle with GI cancer) with ureter cancer  . Colon cancer Neg Hx   . Esophageal cancer Neg Hx   . Rectal cancer Neg Hx   . Stomach cancer Neg Hx   . Colon polyps Neg Hx     Social History   Tobacco Use  . Smoking status: Never Smoker  . Smokeless tobacco: Never Used  Vaping Use  . Vaping Use: Never used  Substance Use Topics  . Alcohol use: Yes    Comment: occasionally  wine/beer  . Drug use: No    Home Medications Prior to Admission medications   Medication Sig Start Date End Date Taking? Authorizing Provider  HYDROcodone-acetaminophen (NORCO/VICODIN) 5-325 MG tablet Take 2 tablets by mouth every 4 (four) hours as needed. 12/18/20  Yes Henderly, Britni A, PA-C  lidocaine (LIDODERM) 5 % Place 1 patch onto the skin daily. Remove & Discard patch within 12 hours or as directed by MD 12/18/20  Yes Henderly, Britni A, PA-C  B Complex Vitamins (VITAMIN B COMPLEX PO) Take 1 tablet by mouth daily.     [provider]  benzonatate (TESSALON) 100 MG capsule Take 1 capsule (100 mg total) by mouth every 8 (eight) hours. 12/15/20   Carlisle Cater, PA-C  calcium carbonate (TUMS) 500 MG chewable tablet Chew 2 tablets (400 mg of elemental calcium total) by mouth 3 times/day as needed-between meals & bedtime for indigestion or heartburn. 11/17/18   Nicholas Lose, MD  carvedilol (COREG) 12.5 MG tablet Take 1 tablet (12.5 mg total) by mouth 2 (two) times daily with a meal. 1 tab in AM and 1 tab in PM 12/06/20   Nicholas Lose, MD  doxycycline (VIBRAMYCIN) 100 MG capsule Take 1 capsule (100 mg total) by mouth 2 (two) times daily. 12/15/20   Carlisle Cater, PA-C  furosemide  (LASIX) 20 MG tablet Take 1 tablet (20 mg total) by mouth daily. 01/12/17   Jerline Pain, MD  hyoscyamine (LEVSIN) 0.125 MG tablet TAKE 1-2 TABLET EVERY 4-6 HOURS AS NEEDED FOR ABDOMINAL PAIN 07/09/20   Levin Erp, PA  lisinopril (ZESTRIL) 40 MG tablet Take 20 mg by mouth 2 (two) times daily.    [provider]  loratadine (CLARITIN) 10 MG tablet Take 10 mg by mouth daily.    [provider]  metroNIDAZOLE (METROCREAM) 0.75 % cream Apply 1 application topically daily.  11/27/19   [provider]  rosuvastatin (CRESTOR) 10 MG tablet Take 1 tablet (10 mg total) by mouth 2 (two) times a week. As ordered by Dr Nancy Fetter 12/06/20 12/06/21  Nicholas Lose, MD  spironolactone (ALDACTONE) 25 MG tablet TAKE 1/2 TABLET BY MOUTH EVERY DAY 04/27/20   Jerline Pain, MD  triamcinolone cream (KENALOG) 0.1 % SMARTSIG:1 Application Topical 2-3 Times Daily 03/27/20   [provider]    Allergies    Patient has no known allergies.  Review of Systems   Review of Systems  Constitutional: Negative.   HENT: Negative.   Respiratory: Positive for cough. Negative for choking, chest tightness and wheezing.   Cardiovascular: Negative.   Gastrointestinal: Positive for nausea (Resolved) and vomiting. Negative for abdominal distention, abdominal pain, anal bleeding, blood in stool, constipation, diarrhea and rectal pain.  Genitourinary: Negative.   Musculoskeletal: Positive for back pain (Left upper back) and neck pain (left paraspinal pain). Negative for neck stiffness.  Skin: Negative.   Neurological: Positive for syncope. Negative for dizziness, tremors, facial asymmetry, speech difficulty, weakness, light-headedness, numbness and headaches.  All  other systems reviewed and are negative.   Physical Exam Updated Vital Signs BP (!) 163/68   Pulse 71   Temp 97.8 F (36.6 C) (Oral)   Resp 15   Ht 5' (1.524 m)   Wt 70 kg   LMP 10/20/1996   SpO2 98%   BMI 30.14 kg/m   Physical  Exam Vitals and nursing note reviewed.  Constitutional:      General: She is not in acute distress.    Appearance: She is well-developed and well-nourished. She is not ill-appearing, toxic-appearing or diaphoretic.  HENT:     Head: Normocephalic.     Jaw: There is normal jaw occlusion.     Comments: Ecchymosis to right chin, right lateral face.  Some tenderness to right forehead without any overlying skin changes.  No crepitus or step-off    Mouth/Throat:     Lips: Pink.     Mouth: Mucous membranes are moist.     Pharynx: Oropharynx is clear. Uvula midline.     Comments: Tongue midline. Eyes:     Extraocular Movements: Extraocular movements intact.     Conjunctiva/sclera: Conjunctivae normal.     Pupils: Pupils are equal, round, and reactive to light.     Comments: Pupils equal reactive to light  Neck:     Trachea: Trachea and phonation normal.      Comments: Tenderness to left paraspinal region. Cardiovascular:     Rate and Rhythm: Normal rate.     Pulses: Normal pulses and intact distal pulses.          Radial pulses are 2+ on the right side and 2+ on the left side.       Dorsalis pedis pulses are 2+ on the right side and 2+ on the left side.     Heart sounds: Normal heart sounds.  Pulmonary:     Effort: Pulmonary effort is normal. No respiratory distress.     Breath sounds: Normal breath sounds and air entry.       Comments: Clear to auscultation bilaterally.  Speaks in full sentences without difficulty Chest:     Comments: Equal rise and fall to chest wall.  No anterior chest wall pain Abdominal:     General: There is no distension.     Palpations: Abdomen is soft.     Tenderness: There is no abdominal tenderness. There is no right CVA tenderness, left CVA tenderness, guarding or rebound.     Hernia: No hernia is present.     Comments: Soft, nontender without rebound or guarding  Musculoskeletal:        General: Normal range of motion.     Cervical back: Full passive  range of motion without pain and normal range of motion.       Back:     Comments: Moves all 4 extremities without difficulty.  Tenderness to left cervical paraspinal region and over superior left scapula.  Full range of motion bilateral shoulders without difficulty.  Skin:    General: Skin is warm and dry.     Capillary Refill: Capillary refill takes less than 2 seconds.     Comments: Ecchymosis to right side of face to right lower quadrant.  Neurological:     Mental Status: She is alert.     Comments: Cranial nerves II to XII grossly intact Ambulatory without difficulty  Psychiatric:        Mood and Affect: Mood and affect normal.    ED Results / Procedures / Treatments  Labs (all labs ordered are listed, but only abnormal results are displayed) Labs Reviewed  BASIC METABOLIC PANEL - Abnormal; Notable for the following components:      Result Value   Glucose, Bld 102 (*)    Creatinine, Ser 1.30 (*)    GFR, Estimated 41 (*)    All other components within normal limits  CBC WITH DIFFERENTIAL/PLATELET  TROPONIN I (HIGH SENSITIVITY)    EKG EKG Interpretation  Date/Time:  Tuesday December 18 2020 13:20:49 EST Ventricular Rate:  83 PR Interval:    QRS Duration: 84 QT Interval:  364 QTC Calculation: 428 R Axis:   -10 Text Interpretation: Sinus rhythm Low voltage, extremity and precordial leads No STEMI Confirmed by Octaviano Glow [phone]) on 12/18/2020 2:14:21 PM   Radiology CT Head Wo Contrast  Result Date: 12/18/2020 CLINICAL DATA:  Fall, right facial bruising EXAM: CT HEAD WITHOUT CONTRAST CT MAXILLOFACIAL WITHOUT CONTRAST CT CERVICAL SPINE WITHOUT CONTRAST TECHNIQUE: Multidetector CT imaging of the head, cervical spine, and maxillofacial structures were performed using the standard protocol without intravenous contrast. Multiplanar CT image reconstructions of the cervical spine and maxillofacial structures were also generated. COMPARISON:  CT images from PET-CT of 03/15/2015  FINDINGS: CT HEAD FINDINGS Brain: The brainstem, cerebellum, cerebral peduncles, thalami, basal ganglia, basilar cisterns, and ventricular system appear within normal limits. Periventricular white matter and corona radiata hypodensities favor chronic ischemic microvascular white matter disease. No intracranial hemorrhage, mass lesion, or acute CVA. Vascular: Unremarkable Skull: No calvarial fracture is identified. Other: No supplemental non-categorized findings. CT MAXILLOFACIAL FINDINGS Osseous: No facial fracture identified. Orbits: Unremarkable Sinuses: Mucosal thickening and some opacification in the frontal sinuses and especially in the ethmoid air cells. Faint mucosal thickening in the sphenoid sinuses. This favors chronic sinusitis. Mucosal thickening with associated air-levels in the maxillary sinuses favoring acute on chronic sinusitis. Soft tissues: Right facial soft tissue swelling overlying the maxilla. Soft tissues at the level of the teeth are somewhat obscured by the patient's dental restorations. Probable soft tissue swelling along the chin eccentric to the right. CT CERVICAL SPINE FINDINGS Alignment: 1.5 mm degenerative anterolisthesis at C7-T1. Skull base and vertebrae: Facet and interbody fusion at C4-5. Degenerative disc disease with endplate sclerosis and loss of disc height at C5-6 and C6-7. Loss of intervertebral disc height at C3-4. Soft tissues and spinal canal: Left common carotid atherosclerotic calcification. Disc levels: Uncinate and facet spurring cause osseous foraminal stenosis on the right at C3-4, C4-5, and C5-6; and on the left at C3-4, C4-5, C5-6, and C6-7. Upper chest: Unremarkable Other: No supplemental non-categorized findings. IMPRESSION: 1. No acute fracture or acute intracranial findings. 2. Right facial soft tissue swelling overlying the maxilla. Probable soft tissue swelling along the chin eccentric to the right. 3. Periventricular white matter and corona radiata  hypodensities favor chronic ischemic microvascular white matter disease. 4. Cervical spondylosis and degenerative disc disease causing multilevel osseous foraminal impingement. 5. Left common carotid atherosclerotic calcification. 6. Chronic paranasal sinusitis. Acute on chronic bilateral maxillary sinusitis. Electronically Signed   By: Van Clines M.D.   On: 12/18/2020 14:35   CT Maxillofacial Wo Contrast  Result Date: 12/18/2020 CLINICAL DATA:  Fall, right facial bruising EXAM: CT HEAD WITHOUT CONTRAST CT MAXILLOFACIAL WITHOUT CONTRAST CT CERVICAL SPINE WITHOUT CONTRAST TECHNIQUE: Multidetector CT imaging of the head, cervical spine, and maxillofacial structures were performed using the standard protocol without intravenous contrast. Multiplanar CT image reconstructions of the cervical spine and maxillofacial structures were also generated. COMPARISON:  CT images from PET-CT  of 03/15/2015 FINDINGS: CT HEAD FINDINGS Brain: The brainstem, cerebellum, cerebral peduncles, thalami, basal ganglia, basilar cisterns, and ventricular system appear within normal limits. Periventricular white matter and corona radiata hypodensities favor chronic ischemic microvascular white matter disease. No intracranial hemorrhage, mass lesion, or acute CVA. Vascular: Unremarkable Skull: No calvarial fracture is identified. Other: No supplemental non-categorized findings. CT MAXILLOFACIAL FINDINGS Osseous: No facial fracture identified. Orbits: Unremarkable Sinuses: Mucosal thickening and some opacification in the frontal sinuses and especially in the ethmoid air cells. Faint mucosal thickening in the sphenoid sinuses. This favors chronic sinusitis. Mucosal thickening with associated air-levels in the maxillary sinuses favoring acute on chronic sinusitis. Soft tissues: Right facial soft tissue swelling overlying the maxilla. Soft tissues at the level of the teeth are somewhat obscured by the patient's dental restorations.  Probable soft tissue swelling along the chin eccentric to the right. CT CERVICAL SPINE FINDINGS Alignment: 1.5 mm degenerative anterolisthesis at C7-T1. Skull base and vertebrae: Facet and interbody fusion at C4-5. Degenerative disc disease with endplate sclerosis and loss of disc height at C5-6 and C6-7. Loss of intervertebral disc height at C3-4. Soft tissues and spinal canal: Left common carotid atherosclerotic calcification. Disc levels: Uncinate and facet spurring cause osseous foraminal stenosis on the right at C3-4, C4-5, and C5-6; and on the left at C3-4, C4-5, C5-6, and C6-7. Upper chest: Unremarkable Other: No supplemental non-categorized findings. IMPRESSION: 1. No acute fracture or acute intracranial findings. 2. Right facial soft tissue swelling overlying the maxilla. Probable soft tissue swelling along the chin eccentric to the right. 3. Periventricular white matter and corona radiata hypodensities favor chronic ischemic microvascular white matter disease. 4. Cervical spondylosis and degenerative disc disease causing multilevel osseous foraminal impingement. 5. Left common carotid atherosclerotic calcification. 6. Chronic paranasal sinusitis. Acute on chronic bilateral maxillary sinusitis. Electronically Signed   By: Van Clines M.D.   On: 12/18/2020 14:35    Procedures Procedures   Medications Ordered in ED Medications  lidocaine (LIDODERM) 5 % 1 patch (1 patch Transdermal Patch Applied 12/18/20 1732)  HYDROcodone-acetaminophen (NORCO/VICODIN) 5-325 MG per tablet 1 tablet (1 tablet Oral Given 12/18/20 1315)  sodium chloride 0.9 % bolus 500 mL (0 mLs Intravenous Stopped 12/18/20 1740)    ED Course  I have reviewed the triage vital signs and the nursing notes.  Pertinent labs & imaging results that were available during my care of the patient were reviewed by me and considered in my medical decision making (see chart for details).  ECHO 2018 with EF 72-86 %  86 year old here for  evaluation of fall.  She is afebrile, nonseptic, non-ill-appearing.  Seen 3 days ago for cough.  Diagnosed right lower lobe pneumonia, prescribed doxy and Tessalon.  Took antibiotics in empty stomach, subsequently became nauseous, was throwing up and had a syncopal episode.  She has some ecchymosis to the right side of her face with some underlying tenderness however no crepitus or step-off.  Was recently at the dentist where he needed some dental work performed due to the nature of the fall.  She also has had some left posterior rib pain overlying scapula and left paraspinal cervical tenderness.  She is tolerating p.o. intake at home.  Heart and lungs clear but abdomen soft, nontender.  Neurovascularly intact.  We will plan on labs, imaging and reassess  Labs and imaging personally reviewed and interpreted:  CBC without leukocytosis Metabolic panel glucose 664, creatinine 1.3, GFR 41, similar to prior Troponin 6, given symptoms occurred greater than 24 hours,  lack of chest pain low suspicion for ACS, do not feel any delta troponin at this time EKG without ischemic changes CT head wo acute finding CT max face with soft tissue swelling consistent with exam CT cervical wo acute findings CT Chest without acute finding Ct thoracic without acute findings DG ribs BL with chest  Patient reassessed. Pain improved. Discussed labs and imaging.  Chest CT shows likely bronchitis.  Will DC home with some additional medications.  With regards to her left scapular pain she has no chest pain or shortness of breath, reassuring imaging.  I have low suspicion for ACS, PE, dissection or occult fracture.  Discussed symptomatic management for MSK sprain or strain at home.  She will return for any worsening symptoms.  The patient has been appropriately medically screened and/or stabilized in the ED. I have low suspicion for any other emergent medical condition which would require further screening, evaluation or treatment  in the ED or require inpatient management.  Patient is hemodynamically stable and in no acute distress.  Patient able to ambulate in department prior to ED.  Evaluation does not show acute pathology that would require ongoing or additional emergent interventions while in the emergency department or further inpatient treatment.  I have discussed the diagnosis with the patient and answered all questions.  Pain is been managed while in the emergency department and patient has no further complaints prior to discharge.  Patient is comfortable with plan discussed in room and is stable for discharge at this time.  I have discussed strict return precautions for returning to the emergency department.  Patient was encouraged to follow-up with PCP/specialist refer to at discharge.  Clinical Course as of 12/18/20 1744  Tue Dec 18, 2020  1433 86 yo female w/ hx of recently diagnosed RLL PNA started on doxycycline for 7 days, presenting to ED with syncope episode 3 days ago.  Reports taking doxy on empty stomach and feeling queasy after, dry heaving, and had LOC.  Struck right face on bathroom tile falling.  Chipped 2-3 teeth.  Not on A/C.  Reporting left shoulder pain ever since her fall, which is worse with certain repositioning.  Today went to dentist and had teeth capped.  Now in ED to get evaluated mainly for shoulder pain.  On exam she has bruising of her right lower mandible.  No focal midline ttp.  I cannot reproduce her chest/back pain on exam.  Pending xray to evaluate for fx or PTX.  ECG is nonischemic and trop is 6 - doubt ACS or massive PE to cause syncope.   [MT]  1435 Pain improved with norco but not relieved completely.  At home she's been taking tylenol only [MT]    Clinical Course User Index [MT] Trifan, Carola Rhine, MD   MDM Rules/Calculators/A&P                           Final Clinical Impression(s) / ED Diagnoses Final diagnoses:  Fall  Syncope, unspecified syncope type  Contusion of face,  initial encounter  Bronchitis  Pain in scapula    Rx / DC Orders ED Discharge Orders         Ordered    HYDROcodone-acetaminophen (NORCO/VICODIN) 5-325 MG tablet  Every 4 hours PRN        12/18/20 1720    lidocaine (LIDODERM) 5 %  Every 24 hours        12/18/20 1720  Nettie Elm, PA-C 12/18/20 1744    Wyvonnia Dusky, MD 12/18/20 [phone]

## 2020-12-18 NOTE — Discharge Instructions (Signed)
Continue taking the doxycycline.  I have added steroids for your cough given bronchitis on your CT scan.  I have also written you for a short course of pain medicine.  Do not drive or operate heavy machinery with this medication.  May apply heat to the area.  Return for any worsening symptoms.

## 2020-12-18 NOTE — ED Triage Notes (Signed)
C/o fall on Sunday after taking 2 prescription meds on empty stomach that caused her to vomit and then passed out  (meds were given to her here on Saturday)  C/o left upper back pain  And pain to rt side of face w bruising and rt side of lip swelling

## 2020-12-25 DIAGNOSIS — N1832 Chronic kidney disease, stage 3b: Secondary | ICD-10-CM | POA: Diagnosis not present

## 2020-12-25 DIAGNOSIS — I129 Hypertensive chronic kidney disease with stage 1 through stage 4 chronic kidney disease, or unspecified chronic kidney disease: Secondary | ICD-10-CM | POA: Diagnosis not present

## 2020-12-25 DIAGNOSIS — Z853 Personal history of malignant neoplasm of breast: Secondary | ICD-10-CM | POA: Diagnosis not present

## 2020-12-25 DIAGNOSIS — I5022 Chronic systolic (congestive) heart failure: Secondary | ICD-10-CM | POA: Diagnosis not present

## 2021-01-07 DIAGNOSIS — I129 Hypertensive chronic kidney disease with stage 1 through stage 4 chronic kidney disease, or unspecified chronic kidney disease: Secondary | ICD-10-CM | POA: Diagnosis not present

## 2021-01-07 DIAGNOSIS — N1831 Chronic kidney disease, stage 3a: Secondary | ICD-10-CM | POA: Diagnosis not present

## 2021-01-07 DIAGNOSIS — E210 Primary hyperparathyroidism: Secondary | ICD-10-CM | POA: Diagnosis not present

## 2021-01-17 DIAGNOSIS — H2513 Age-related nuclear cataract, bilateral: Secondary | ICD-10-CM | POA: Diagnosis not present

## 2021-01-17 DIAGNOSIS — H52203 Unspecified astigmatism, bilateral: Secondary | ICD-10-CM | POA: Diagnosis not present

## 2021-01-17 DIAGNOSIS — H5203 Hypermetropia, bilateral: Secondary | ICD-10-CM | POA: Diagnosis not present

## 2021-01-24 ENCOUNTER — Other Ambulatory Visit: Payer: Self-pay | Admitting: Cardiology

## 2021-01-24 DIAGNOSIS — I420 Dilated cardiomyopathy: Secondary | ICD-10-CM

## 2021-02-19 DIAGNOSIS — I129 Hypertensive chronic kidney disease with stage 1 through stage 4 chronic kidney disease, or unspecified chronic kidney disease: Secondary | ICD-10-CM | POA: Diagnosis not present

## 2021-02-19 DIAGNOSIS — N183 Chronic kidney disease, stage 3 unspecified: Secondary | ICD-10-CM | POA: Diagnosis not present

## 2021-02-19 DIAGNOSIS — N1832 Chronic kidney disease, stage 3b: Secondary | ICD-10-CM | POA: Diagnosis not present

## 2021-02-19 DIAGNOSIS — I5022 Chronic systolic (congestive) heart failure: Secondary | ICD-10-CM | POA: Diagnosis not present

## 2021-02-19 DIAGNOSIS — Z853 Personal history of malignant neoplasm of breast: Secondary | ICD-10-CM | POA: Diagnosis not present

## 2021-05-03 ENCOUNTER — Ambulatory Visit: Payer: Medicare Other | Admitting: Cardiology

## 2021-05-07 DIAGNOSIS — N183 Chronic kidney disease, stage 3 unspecified: Secondary | ICD-10-CM | POA: Diagnosis not present

## 2021-05-07 DIAGNOSIS — N1832 Chronic kidney disease, stage 3b: Secondary | ICD-10-CM | POA: Diagnosis not present

## 2021-05-07 DIAGNOSIS — I5022 Chronic systolic (congestive) heart failure: Secondary | ICD-10-CM | POA: Diagnosis not present

## 2021-05-07 DIAGNOSIS — I129 Hypertensive chronic kidney disease with stage 1 through stage 4 chronic kidney disease, or unspecified chronic kidney disease: Secondary | ICD-10-CM | POA: Diagnosis not present

## 2021-05-07 DIAGNOSIS — Z853 Personal history of malignant neoplasm of breast: Secondary | ICD-10-CM | POA: Diagnosis not present

## 2021-05-12 DIAGNOSIS — N3001 Acute cystitis with hematuria: Secondary | ICD-10-CM | POA: Diagnosis not present

## 2021-05-16 ENCOUNTER — Encounter: Payer: Self-pay | Admitting: Hematology and Oncology

## 2021-05-16 DIAGNOSIS — Z1231 Encounter for screening mammogram for malignant neoplasm of breast: Secondary | ICD-10-CM | POA: Diagnosis not present

## 2021-05-16 DIAGNOSIS — Z803 Family history of malignant neoplasm of breast: Secondary | ICD-10-CM | POA: Diagnosis not present

## 2021-06-19 DIAGNOSIS — Z85828 Personal history of other malignant neoplasm of skin: Secondary | ICD-10-CM | POA: Diagnosis not present

## 2021-06-19 DIAGNOSIS — L821 Other seborrheic keratosis: Secondary | ICD-10-CM | POA: Diagnosis not present

## 2021-06-19 DIAGNOSIS — L810 Postinflammatory hyperpigmentation: Secondary | ICD-10-CM | POA: Diagnosis not present

## 2021-06-19 DIAGNOSIS — L814 Other melanin hyperpigmentation: Secondary | ICD-10-CM | POA: Diagnosis not present

## 2021-06-19 DIAGNOSIS — L820 Inflamed seborrheic keratosis: Secondary | ICD-10-CM | POA: Diagnosis not present

## 2021-06-19 DIAGNOSIS — X32XXXS Exposure to sunlight, sequela: Secondary | ICD-10-CM | POA: Diagnosis not present

## 2021-07-03 ENCOUNTER — Other Ambulatory Visit: Payer: Self-pay | Admitting: Cardiology

## 2021-07-03 DIAGNOSIS — I420 Dilated cardiomyopathy: Secondary | ICD-10-CM

## 2021-07-04 ENCOUNTER — Telehealth: Payer: Self-pay | Admitting: Cardiology

## 2021-07-04 DIAGNOSIS — I5022 Chronic systolic (congestive) heart failure: Secondary | ICD-10-CM | POA: Diagnosis not present

## 2021-07-04 DIAGNOSIS — N183 Chronic kidney disease, stage 3 unspecified: Secondary | ICD-10-CM | POA: Diagnosis not present

## 2021-07-04 DIAGNOSIS — I129 Hypertensive chronic kidney disease with stage 1 through stage 4 chronic kidney disease, or unspecified chronic kidney disease: Secondary | ICD-10-CM | POA: Diagnosis not present

## 2021-07-04 NOTE — Telephone Encounter (Signed)
Pt c/o BP issue: STAT if pt c/o blurred vision, one-sided weakness or slurred speech  1. What are your last 5 BP readings? 111/50, 103/56, 109/58  2. Are you having any other symptoms (ex. Dizziness, headache, blurred vision, passed out)? Feels faint, low energy, fatigue  3. What is your BP issue? Low bp

## 2021-07-04 NOTE — Telephone Encounter (Signed)
Called patient back. Patient stated she already talked to her PCP's Pharm D and he advised her to go back to taking Lisinopril 20 mg BID and to drink more fluids. Informed patient that I would make note of this in her chart and let Dr. Marlou Porch know.

## 2021-07-06 ENCOUNTER — Emergency Department (HOSPITAL_BASED_OUTPATIENT_CLINIC_OR_DEPARTMENT_OTHER)
Admission: EM | Admit: 2021-07-06 | Discharge: 2021-07-06 | Disposition: A | Discharge status: Home / Self Care | Payer: Medicare Other | Attending: Emergency Medicine | Admitting: Emergency Medicine

## 2021-07-06 ENCOUNTER — Encounter (HOSPITAL_BASED_OUTPATIENT_CLINIC_OR_DEPARTMENT_OTHER): Payer: Self-pay

## 2021-07-06 ENCOUNTER — Emergency Department (HOSPITAL_BASED_OUTPATIENT_CLINIC_OR_DEPARTMENT_OTHER): Payer: Medicare Other

## 2021-07-06 ENCOUNTER — Other Ambulatory Visit: Payer: Self-pay

## 2021-07-06 DIAGNOSIS — Z79899 Other long term (current) drug therapy: Secondary | ICD-10-CM | POA: Diagnosis not present

## 2021-07-06 DIAGNOSIS — U071 COVID-19: Secondary | ICD-10-CM | POA: Diagnosis not present

## 2021-07-06 DIAGNOSIS — R42 Dizziness and giddiness: Secondary | ICD-10-CM | POA: Diagnosis not present

## 2021-07-06 DIAGNOSIS — Z85828 Personal history of other malignant neoplasm of skin: Secondary | ICD-10-CM | POA: Insufficient documentation

## 2021-07-06 DIAGNOSIS — Z853 Personal history of malignant neoplasm of breast: Secondary | ICD-10-CM | POA: Insufficient documentation

## 2021-07-06 DIAGNOSIS — Z86018 Personal history of other benign neoplasm: Secondary | ICD-10-CM | POA: Diagnosis not present

## 2021-07-06 DIAGNOSIS — I130 Hypertensive heart and chronic kidney disease with heart failure and stage 1 through stage 4 chronic kidney disease, or unspecified chronic kidney disease: Secondary | ICD-10-CM | POA: Insufficient documentation

## 2021-07-06 DIAGNOSIS — N183 Chronic kidney disease, stage 3 unspecified: Secondary | ICD-10-CM | POA: Diagnosis not present

## 2021-07-06 DIAGNOSIS — R197 Diarrhea, unspecified: Secondary | ICD-10-CM | POA: Diagnosis not present

## 2021-07-06 DIAGNOSIS — R059 Cough, unspecified: Secondary | ICD-10-CM | POA: Diagnosis not present

## 2021-07-06 DIAGNOSIS — R9431 Abnormal electrocardiogram [ECG] [EKG]: Secondary | ICD-10-CM | POA: Diagnosis not present

## 2021-07-06 DIAGNOSIS — I5022 Chronic systolic (congestive) heart failure: Secondary | ICD-10-CM | POA: Insufficient documentation

## 2021-07-06 DIAGNOSIS — R531 Weakness: Secondary | ICD-10-CM | POA: Diagnosis not present

## 2021-07-06 LAB — CBC WITH DIFFERENTIAL/PLATELET
Abs Immature Granulocytes: 0.02 10*3/uL (ref 0.00–0.07)
Basophils Absolute: 0.1 10*3/uL (ref 0.0–0.1)
Basophils Relative: 1 %
Eosinophils Absolute: 0 10*3/uL (ref 0.0–0.5)
Eosinophils Relative: 0 %
HCT: 44.5 % (ref 36.0–46.0)
Hemoglobin: 15.1 g/dL — ABNORMAL HIGH (ref 12.0–15.0)
Immature Granulocytes: 0 %
Lymphocytes Relative: 28 %
Lymphs Abs: 1.6 10*3/uL (ref 0.7–4.0)
MCH: 30 pg (ref 26.0–34.0)
MCHC: 33.9 g/dL (ref 30.0–36.0)
MCV: 88.5 fL (ref 80.0–100.0)
Monocytes Absolute: 0.6 10*3/uL (ref 0.1–1.0)
Monocytes Relative: 10 %
Neutro Abs: 3.4 10*3/uL (ref 1.7–7.7)
Neutrophils Relative %: 61 %
Platelets: 213 10*3/uL (ref 150–400)
RBC: 5.03 MIL/uL (ref 3.87–5.11)
RDW: 12.9 % (ref 11.5–15.5)
WBC: 5.7 10*3/uL (ref 4.0–10.5)
nRBC: 0 % (ref 0.0–0.2)

## 2021-07-06 LAB — COMPREHENSIVE METABOLIC PANEL
ALT: 16 U/L (ref 0–44)
AST: 24 U/L (ref 15–41)
Albumin: 3.9 g/dL (ref 3.5–5.0)
Alkaline Phosphatase: 57 U/L (ref 38–126)
Anion gap: 9 (ref 5–15)
BUN: 21 mg/dL (ref 8–23)
CO2: 23 mmol/L (ref 22–32)
Calcium: 9.7 mg/dL (ref 8.9–10.3)
Chloride: 101 mmol/L (ref 98–111)
Creatinine, Ser: 1.19 mg/dL — ABNORMAL HIGH (ref 0.44–1.00)
GFR, Estimated: 45 mL/min — ABNORMAL LOW (ref >60)
Glucose, Bld: 119 mg/dL — ABNORMAL HIGH (ref 70–99)
Potassium: 3.6 mmol/L (ref 3.5–5.1)
Sodium: 133 mmol/L — ABNORMAL LOW (ref 135–145)
Total Bilirubin: 0.6 mg/dL (ref 0.3–1.2)
Total Protein: 6.5 g/dL (ref 6.5–8.1)

## 2021-07-06 LAB — URINALYSIS, ROUTINE W REFLEX MICROSCOPIC
Bilirubin Urine: NEGATIVE
Glucose, UA: NEGATIVE mg/dL
Hgb urine dipstick: NEGATIVE
Ketones, ur: NEGATIVE mg/dL
Nitrite: NEGATIVE
Protein, ur: NEGATIVE mg/dL
Specific Gravity, Urine: 1.01 (ref 1.005–1.030)
pH: 6 (ref 5.0–8.0)

## 2021-07-06 LAB — URINALYSIS, MICROSCOPIC (REFLEX)

## 2021-07-06 LAB — RESP PANEL BY RT-PCR (FLU A&B, COVID) ARPGX2
Influenza A by PCR: NEGATIVE
Influenza B by PCR: NEGATIVE
SARS Coronavirus 2 by RT PCR: POSITIVE — AB

## 2021-07-06 MED ORDER — LACTATED RINGERS IV BOLUS
1000.0000 mL | Freq: Once | INTRAVENOUS | Status: AC
  Administered 2021-07-06: 1000 mL via INTRAVENOUS

## 2021-07-06 MED ORDER — NIRMATRELVIR/RITONAVIR (PAXLOVID) TABLET (RENAL DOSING): 2.0000 | ORAL_TABLET | Freq: Two times a day (BID) | ORAL | 0 refills | Status: AC

## 2021-07-06 NOTE — ED Notes (Signed)
Patient reports increased dizziness and weakness over last 3 days and states worsened when blood pressure is below 412 systolic.

## 2021-07-06 NOTE — ED Provider Notes (Signed)
[facility] EMERGENCY DEPARTMENT Provider Note  CSN: [id number] Arrival date & time: 07/06/21 1213    History Chief Complaint  Patient presents with  . Weakness  . Dizziness    Carla Bennett is a 86 y.o. female reports she has had several days of general weakness, particularly around the middle of the day when she reports her BP has been lower than typical (105/55 instead of 135/75). She has had a mild cough, without fever or SOB. No chest pain or abdominal pain. She has not been nauseated but reports when she eats she only gets a few bites in before she doesn't want anymore. She has had some non-bloody diarrhea as well.    Past Medical History:  Diagnosis Date  . Allergy   . Aortic regurgitation   . Arthritis    hands, neck  . Basal cell carcinoma   . Benign neoplasm of colon   . Breast cancer of upper-outer quadrant of left female breast ([place]) 02/23/2015   4'16-left breast cancer-surgery, chemo, radiation-[name] follows every 3 months  . Cataract    beginning stages-monitoring by MD  . Cholelithiasis    resolved with surgery  . Chronic systolic CHF (congestive heart failure) ([place]) 02/26/2016   A.  Echo 02/21/16:  Mild LVH, EF 20-25%, diff HK, Gr 3 DD, mod AI, mod to severe MR, mod LAE, mod redcued RVSF, mild RAE, mod TR, PASP 53 mmHg, small pericardial effusion, mod L pleural effusion  //  B. Echo 6/17:  Mild LVH EF 30-35%, diffuse HK, grade 1 diastolic dysfunction, trivial AI, MAC, mild LAE, normal RVSF, trivial pericardial effusion    . Cystocele   . Diverticulitis   . Diverticulosis of colon (without mention of hemorrhage)   . Gout    resolved -  . Hearing loss    some hearing loss in right ear- no hearing aids  . Heart murmur   . Hematuria    negative urology work up  . History of blood transfusion 1971   x 1 with D&C -   . Hx of abnormal cervical Pap smear    LEEP-CIN I, negative margins and ECC  . Hypercalcemia   . Hypertension   . Mitral  regurgitation   . 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  . OA (osteoarthritis) of hip    right  . Other issue of medical certificates    uterine myomata  . Parathyroid abnormality ([facility])    being followed Dr. Barbette Hair  . Radiation 10/01/15-11/16/15   left breast axillary and supraclavicular reigion 45 gray, left breast 50.4 gray, lumpectomy cavity boost 10 gray  . Renal disease 3/16   Stage 3 kidney disease-seeing Dr Buddy Duty and Dr Marval Regal  . Uterine prolaps    resolved after surgery  . Vitamin D deficiency     Past Surgical History:  Procedure Laterality Date  . BREAST LUMPECTOMY Left 08/2015  . CHOLECYSTECTOMY    . COLONOSCOPY  01/18/2013   [name]  . COLONOSCOPY N/A 06/26/2016   Procedure: COLONOSCOPY;  Surgeon: Irene Shipper, MD;  Location: [facility];  Service: Endoscopy;  Laterality: N/A;  . CYSTOCELE REPAIR  08/2010   Rectocele, vault prolapse  . DILATION AND CURETTAGE OF UTERUS     x2  . PORT-A-CATH REMOVAL Right 08/23/2015   Procedure: REMOVAL PORT-A-CATH;  Surgeon: Erroll Luna, MD;  Location: [facility];  Service: General;  Laterality: Right;  . PORTACATH PLACEMENT Right  03/20/2015   Procedure: INSERTION PORT-A-CATH;  Surgeon: Erroll Luna, MD;  Location: [facility];  Service: General;  Laterality: Right;  . RADIOACTIVE SEED GUIDED PARTIAL MASTECTOMY WITH AXILLARY SENTINEL LYMPH NODE BIOPSY Left 08/23/2015   Procedure: LEFT BREAST PARTIAL MASTECTOMY WITH SENTINEL LYMPH NODE MAPPING;  Surgeon: Erroll Luna, MD;  Location: [facility];  Service: General;  Laterality: Left;  . TONSILLECTOMY AND ADENOIDECTOMY    . TOTAL VAGINAL HYSTERECTOMY     secondary to prolapse, ovaries not removed    Family History  Problem Relation Age of Onset  . Lung cancer Father 75  . Parkinson's disease Mother   . Alzheimer's disease Mother        ? diag  . Lung cancer Mother   . Breast cancer Sister 69  . Multiple sclerosis  Sister   . Prostate cancer Maternal Uncle 63  . Cancer Maternal Uncle        stomache  . Cancer Maternal Uncle 43       GI cancer - ? stomach  . Cancer Cousin 50       mat female first cousin (son of mat uncle with GI cancer) with ureter cancer  . Colon cancer Neg Hx   . Esophageal cancer Neg Hx   . Rectal cancer Neg Hx   . Stomach cancer Neg Hx   . Colon polyps Neg Hx     Social History   Tobacco Use  . Smoking status: Never  . Smokeless tobacco: Never  Vaping Use  . Vaping Use: Never used  Substance Use Topics  . Alcohol use: Yes    Comment: occasionally  wine/beer  . Drug use: No     Home Medications Prior to Admission medications   Medication Sig Start Date End Date Taking? Authorizing Provider  B Complex Vitamins (VITAMIN B COMPLEX PO) Take 1 tablet by mouth daily.     [provider]  benzonatate (TESSALON) 100 MG capsule Take 1 capsule (100 mg total) by mouth every 8 (eight) hours. 12/15/20   Carlisle Cater, PA-C  calcium carbonate (TUMS) 500 MG chewable tablet Chew 2 tablets (400 mg of elemental calcium total) by mouth 3 times/day as needed-between meals & bedtime for indigestion or heartburn. 11/17/18   Nicholas Lose, MD  carvedilol (COREG) 12.5 MG tablet Take 1 tablet (12.5 mg total) by mouth 2 (two) times daily with a meal. 1 tab in AM and 1 tab in PM 12/06/20   Nicholas Lose, MD  doxycycline (VIBRAMYCIN) 100 MG capsule Take 1 capsule (100 mg total) by mouth 2 (two) times daily. 12/15/20   Carlisle Cater, PA-C  furosemide (LASIX) 20 MG tablet Take 1 tablet (20 mg total) by mouth daily. 01/12/17   Jerline Pain, MD  HYDROcodone-acetaminophen (NORCO/VICODIN) 5-325 MG tablet Take 2 tablets by mouth every 4 (four) hours as needed. 12/18/20   Henderly, Britni A, PA-C  hyoscyamine (LEVSIN) 0.125 MG tablet TAKE 1-2 TABLET EVERY 4-6 HOURS AS NEEDED FOR ABDOMINAL PAIN 07/09/20   Levin Erp, PA  lidocaine (LIDODERM) 5 % Place 1 patch onto the skin daily. Remove &  Discard patch within 12 hours or as directed by MD 12/18/20   Henderly, Britni A, PA-C  lisinopril (ZESTRIL) 40 MG tablet Take 20 mg by mouth 2 (two) times daily.    [provider]  loratadine (CLARITIN) 10 MG tablet Take 10 mg by mouth daily.    [provider]  metroNIDAZOLE (METROCREAM) 0.75 % cream Apply 1  application topically daily.  11/27/19   [provider]  rosuvastatin (CRESTOR) 10 MG tablet Take 1 tablet (10 mg total) by mouth 2 (two) times a week. As ordered by Dr Nancy Fetter 12/06/20 12/06/21  Nicholas Lose, MD  spironolactone (ALDACTONE) 25 MG tablet TAKE 1/2 TABLET BY MOUTH EVERY DAY 07/03/21   Jerline Pain, MD  triamcinolone cream (KENALOG) 0.1 % SMARTSIG:1 Application Topical 2-3 Times Daily 03/27/20   [provider]     Allergies    Patient has no known allergies.   Review of Systems   Review of Systems A comprehensive review of systems was completed and negative except as noted in HPI.    Physical Exam BP (!) 151/69 (BP Location: Right Arm)   Pulse 64   Temp 98.2 F (36.8 C) (Oral)   Resp 18   Ht 5' (1.524 m)   Wt 67.9 kg   LMP 10/20/1996   SpO2 97%   BMI 29.22 kg/m   Physical Exam Vitals and nursing note reviewed.  Constitutional:      Appearance: Normal appearance.  HENT:     Head: Normocephalic and atraumatic.     Nose: Nose normal.     Mouth/Throat:     Mouth: Mucous membranes are dry.  Eyes:     Extraocular Movements: Extraocular movements intact.     Conjunctiva/sclera: Conjunctivae normal.  Cardiovascular:     Rate and Rhythm: Normal rate.  Pulmonary:     Effort: Pulmonary effort is normal.     Breath sounds: Normal breath sounds.  Abdominal:     General: Abdomen is flat.     Palpations: Abdomen is soft.     Tenderness: There is no abdominal tenderness.  Musculoskeletal:        General: No swelling. Normal range of motion.     Cervical back: Neck supple.  Skin:    General: Skin is warm and dry.  Neurological:      General: No focal deficit present.     Mental Status: She is alert.  Psychiatric:        Mood and Affect: Mood normal.     ED Results / Procedures / Treatments   Labs (all labs ordered are listed, but only abnormal results are displayed) Labs Reviewed  RESP PANEL BY RT-PCR (FLU A&B, COVID) ARPGX2  COMPREHENSIVE METABOLIC PANEL  CBC WITH DIFFERENTIAL/PLATELET  URINALYSIS, ROUTINE W REFLEX MICROSCOPIC    EKG None   Radiology No results found.  Procedures Procedures  Medications Ordered in the ED Medications  lactated ringers bolus 1,000 mL (has no administration in time range)     MDM Rules/Calculators/A&P MDM Patient with multiple vague symptoms, could be some dehydration given poor intake and diarrhea. Will check labs, Covid, CXR, give IVF and reassess.   ED Course  I have reviewed the triage vital signs and the nursing notes.  Pertinent labs & imaging results that were available during my care of the patient were reviewed by me and considered in my medical decision making (see chart for details).  Clinical Course as of 07/06/21 1518  Sat Jul 06, 2021  1421 CBC is unremarkable. UA is clear. CMP without significant abnormalities. CXR without signs of infection.  [CS]  9702 Covid test is positive which would explain many of the patient's symptoms. She was advised to quarantine as per usual protocol. Rx for Paxlovid. PCP follow up. RTED for any other concerns.  [CS]    Clinical Course User Index [CS] Truddie Hidden,  MD    Final Clinical Impression(s) / ED Diagnoses Final diagnoses:  None    Rx / DC Orders ED Discharge Orders     None        Truddie Hidden, MD 07/06/21 [phone]

## 2021-07-06 NOTE — ED Triage Notes (Addendum)
[facility] for weakness and dizziness x 3 days, diarrhea x 2 over last 3 days.  VSS, glucose 140.  Patient has been monitoring her blood pressure and "anything below 120 is low for me and my symptoms get worse".

## 2021-07-06 NOTE — ED Notes (Signed)
Patient Alert and oriented to baseline. Stable and ambulatory to baseline. Patient verbalized understanding of the discharge instructions.  Patient belongings were taken by the patient.   

## 2021-07-23 ENCOUNTER — Other Ambulatory Visit: Payer: Self-pay

## 2021-07-23 ENCOUNTER — Ambulatory Visit (INDEPENDENT_AMBULATORY_CARE_PROVIDER_SITE_OTHER): Payer: Medicare Other | Admitting: Cardiology

## 2021-07-23 DIAGNOSIS — I5022 Chronic systolic (congestive) heart failure: Secondary | ICD-10-CM | POA: Diagnosis not present

## 2021-07-23 DIAGNOSIS — I3139 Other pericardial effusion (noninflammatory): Secondary | ICD-10-CM

## 2021-07-23 DIAGNOSIS — I10 Essential (primary) hypertension: Secondary | ICD-10-CM | POA: Diagnosis not present

## 2021-07-23 NOTE — Assessment & Plan Note (Signed)
Effusion have resolved.  Excellent.

## 2021-07-23 NOTE — Assessment & Plan Note (Signed)
Last echocardiogram overall reassuring with EF of 45 to 50%.  NYHA class I.  She feels much better.  We are stopping her spironolactone because her blood pressures are too low she states.  Continue with carvedilol 12.5 mg twice a day.  Continue with lisinopril 20 mg twice a day.

## 2021-07-23 NOTE — Patient Instructions (Signed)
Medication Instructions:  Please discontinue your Spironolactone. Continue all other medications as listed.  *If you need a refill on your cardiac medications before your next appointment, please call your pharmacy*  Follow-Up: At [facility], you and your health needs are our priority.  As part of our continuing mission to provide you with exceptional heart care, we have created designated Provider Care Teams.  These Care Teams include your primary Cardiologist (physician) and Advanced Practice Providers (APPs -  Physician Assistants and Nurse Practitioners) who all work together to provide you with the care you need, when you need it.  We recommend signing up for the patient portal called "MyChart".  Sign up information is provided on this After Visit Summary.  MyChart is used to connect with patients for Virtual Visits (Telemedicine).  Patients are able to view lab/test results, encounter notes, upcoming appointments, etc.  Non-urgent messages can be sent to your provider as well.   To learn more about what you can do with MyChart, go to [email or web address].    Your next appointment:   1 month(s)  The format for your next appointment:   In Person  Provider:   Candee Furbish, MD   Thank you for choosing [facility]!!

## 2021-07-23 NOTE — Progress Notes (Signed)
Cardiology Office Note:    Date:  07/23/2021   ID:  Carla Bennett, DOB [birth year], MRN [id number]  PCP:  Donald Prose, MD  [facility] Cardiologist:  Candee Furbish, MD  [facility] Electrophysiologist:  None   Referring MD: Donald Prose, MD    History of Present Illness:    Carla Bennett is a 86 y.o. female here for the follow-up of congestive heart failure and hypertension.  Hx of previous chemotherapy for breast cancer, pericardial effusion.   In review of prior notes:  She had an echo performed on 03/13/15 which at that time showed no evidence of pericardial effusion. Mild mitral and aortic regurgitation noted. Normal ejection fraction. This was pre-chemotherapy to breast cancer.   Moderate-sized pericardial effusion was personally viewed on CT scan from 02/07/16. Aortic atherosclerosis and mild coronary artery atherosclerosis was also noted. Pleural effusions were also noted.   On her echocardiogram originally, her ejection fraction was markedly reduced at 20-25%. She had been treated with Adriamycin. This also showed moderate pericardial effusion.   A subsequent follow-up echo 1 month later showed ejection fraction of 35%. Improved. Effusion was trace.   She still has some GI issues for several years, ended up having a diet where she eliminated many fruits.  Dr. Henrene Pastor.  At her last appointment she was doing quite well without any fevers chills nausea vomiting syncope bleeding.  See below for details.  Today: Overall she is feeling good but notes her blood pressure is too low. She presents a BP log. Typically she feels well with readings around 135-140/70s. However, below 950 systolic she feels "too wiped out to do anything." Her muscles ache, she has no energy, and she will become short of breath easily. Of note, she states this has been affecting her physical exercise.  She denies any palpitations, or chest pain. No lightheadedness, headaches, syncope, orthopnea, or  PND. Also has no lower extremity edema.   Past Medical History:  Diagnosis Date   Allergy    Aortic regurgitation    Arthritis    hands, neck   Basal cell carcinoma    Benign neoplasm of colon    Breast cancer of upper-outer quadrant of left female breast ([place]) 02/23/2015   4'16-left breast cancer-surgery, chemo, radiation-[name] follows every 3 months   Cataract    beginning stages-monitoring by MD   Cholelithiasis    resolved with surgery   Chronic systolic CHF (congestive heart failure) ([place]) 02/26/2016   A.  Echo 02/21/16:  Mild LVH, EF 20-25%, diff HK, Gr 3 DD, mod AI, mod to severe MR, mod LAE, mod redcued RVSF, mild RAE, mod TR, PASP 53 mmHg, small pericardial effusion, mod L pleural effusion  //  B. Echo 6/17:  Mild LVH EF 30-35%, diffuse HK, grade 1 diastolic dysfunction, trivial AI, MAC, mild LAE, normal RVSF, trivial pericardial effusion     Cystocele    Diverticulitis    Diverticulosis of colon (without mention of hemorrhage)    Gout    resolved -   Hearing loss    some hearing loss in right ear- no hearing aids   Heart murmur    Hematuria    negative urology work up   History of blood transfusion 1971   x 1 with D&C -    Hx of abnormal cervical Pap smear    LEEP-CIN I, negative margins and ECC   Hypercalcemia    Hypertension    Mitral regurgitation    Neuromuscular disorder ([place])  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   OA (osteoarthritis) of hip    right   Other issue of medical certificates    uterine myomata   Parathyroid abnormality ([place])    being followed Dr. Barbette Hair   Radiation 10/01/15-11/16/15   left breast axillary and supraclavicular reigion 45 gray, left breast 50.4 gray, lumpectomy cavity boost 10 gray   Renal disease 3/16   Stage 3 kidney disease-seeing Dr Buddy Duty and Dr Marval Regal   Uterine prolaps    resolved after surgery   Vitamin D deficiency     Past Surgical History:  Procedure Laterality Date   BREAST LUMPECTOMY Left 08/2015    CHOLECYSTECTOMY     COLONOSCOPY  01/18/2013   [name]   COLONOSCOPY N/A 06/26/2016   Procedure: COLONOSCOPY;  Surgeon: Irene Shipper, MD;  Location: [facility];  Service: Endoscopy;  Laterality: N/A;   CYSTOCELE REPAIR  08/2010   Rectocele, vault prolapse   DILATION AND CURETTAGE OF UTERUS     x2   PORT-A-CATH REMOVAL Right 08/23/2015   Procedure: REMOVAL PORT-A-CATH;  Surgeon: Erroll Luna, MD;  Location: [address];  Service: General;  Laterality: Right;   PORTACATH PLACEMENT Right 03/20/2015   Procedure: INSERTION PORT-A-CATH;  Surgeon: Erroll Luna, MD;  Location: [place];  Service: General;  Laterality: Right;   RADIOACTIVE SEED GUIDED PARTIAL MASTECTOMY WITH AXILLARY SENTINEL LYMPH NODE BIOPSY Left 08/23/2015   Procedure: LEFT BREAST PARTIAL MASTECTOMY WITH SENTINEL LYMPH NODE [place];  Surgeon: Erroll Luna, MD;  Location: [place];  Service: General;  Laterality: Left;   TONSILLECTOMY AND ADENOIDECTOMY     TOTAL VAGINAL HYSTERECTOMY     secondary to prolapse, ovaries not removed    Current Medications: Current Meds  Medication Sig   B Complex Vitamins (VITAMIN B COMPLEX PO) Take 1 tablet by mouth daily.    calcium carbonate (TUMS) 500 MG chewable tablet Chew 2 tablets (400 mg of elemental calcium total) by mouth 3 times/day as needed-between meals & bedtime for indigestion or heartburn.   carvedilol (COREG) 12.5 MG tablet Take 1 tablet (12.5 mg total) by mouth 2 (two) times daily with a meal. 1 tab in AM and 1 tab in PM   furosemide (LASIX) 20 MG tablet Take 1 tablet (20 mg total) by mouth daily.   hyoscyamine (LEVSIN) 0.125 MG tablet TAKE 1-2 TABLET EVERY 4-6 HOURS AS NEEDED FOR ABDOMINAL PAIN   lidocaine (LIDODERM) 5 % Place 1 patch onto the skin daily. Remove & Discard patch within 12 hours or as directed by MD   lisinopril (ZESTRIL) 40 MG tablet Take 20 mg by mouth 2 (two) times daily.   loratadine (CLARITIN) 10 MG tablet Take 10 mg by mouth  daily.   metroNIDAZOLE (METROCREAM) 0.75 % cream Apply 1 application topically daily.    rosuvastatin (CRESTOR) 10 MG tablet Take 1 tablet (10 mg total) by mouth 2 (two) times a week. As ordered by Dr Nancy Fetter   triamcinolone cream (KENALOG) 0.1 % SMARTSIG:1 Application Topical 2-3 Times Daily   [DISCONTINUED] spironolactone (ALDACTONE) 25 MG tablet TAKE 1/2 TABLET BY MOUTH EVERY DAY     Allergies:   Patient has no known allergies.   Social History   Socioeconomic History   Marital status: Widowed    Spouse name: Not on file   Number of children: 4   Years of education: Not on file   Highest education level: Not on file  Occupational History   Occupation: retired  Tobacco Use   Smoking status: Never   Smokeless tobacco: Never  Vaping Use   Vaping Use: Never used  Substance and Sexual Activity   Alcohol use: Yes    Comment: occasionally  wine/beer   Drug use: No   Sexual activity: Never    Birth control/protection: Surgical    Comment: Vaginal hysterectomy  Other Topics Concern   Not on file  Social History Narrative   Not on file   Social Determinants of Health   Financial Resource Strain: Not on file  Food Insecurity: Not on file  Transportation Needs: Not on file  Physical Activity: Not on file  Stress: Not on file  Social Connections: Not on file     Family History: The patient's family history includes Alzheimer's disease in her mother; Breast cancer (age of onset: 62) in her sister; Cancer in her maternal uncle; Cancer (age of onset: 66) in her cousin and maternal uncle; Lung cancer in her mother; Lung cancer (age of onset: 109) in her father; Multiple sclerosis in her sister; Parkinson's disease in her mother; Prostate cancer (age of onset: 41) in her maternal uncle. There is no history of Colon cancer, Esophageal cancer, Rectal cancer, Stomach cancer, or Colon polyps.  ROS:   Please see the history of present illness. (+) Myalgias (+) Fatigue (+) Shortness of  breath All other systems are reviewed and negative.   EKGs/Labs/Other Studies Reviewed:    CT Chest 12/18/2020: FINDINGS: Cardiovascular: No significant vascular findings. Normal heart size. No pericardial effusion.   Mediastinum/Nodes: No axillary supraclavicular adenopathy. No mediastinal adenopathy. Trachea and esophagus are normal. Small hiatal hernia.   Lungs/Pleura: No pneumothorax or pulmonary contusion. No pleural fluid. No aspiration. Mild RIGHT lower lobe bronchial thickening.   Upper Abdomen: Limited view of the liver, kidneys, pancreas are unremarkable. Normal adrenal glands.   Musculoskeletal: No rib fracture.  No spine fracture   IMPRESSION: 1. No evidence of thoracic trauma. 2. Hiatal hernia. 3. Mild bronchitis in the RIGHT lower lobe  TTE 01/21/2017: - Left ventricle: The cavity size was normal. Wall thickness was    normal. Systolic function was mildly reduced. The estimated    ejection fraction was in the range of 45% to 50%. Inferolateral    hypokinesis. Abnormal GLPSS at -14% wtih inferior strain    abnormality. Doppler parameters are consistent with abnormal left    ventricular relaxation (grade 1 diastolic dysfunction). The E/e&'    ratio is >15, suggesting elevated LV filling pressure.  - Aortic valve: Trileaflet. Sclerosis without stenosis. There was    trivial regurgitation.  - Left atrium: The atrium was normal in size.  - Inferior vena cava: The vessel was normal in size. The    respirophasic diameter changes were in the normal range (>= 50%),    consistent with normal central venous pressure.   Impressions:  - Compared to a prior study in 2017, the LVEF has improved to    45-50% - there is inferior and inferolateral hypokinesis.   EKG:  EKG is personally reviewed and interpreted. 07/23/2021: EKG was not ordered today.  Recent Labs: 07/06/2021: ALT 16; BUN 21; Creatinine, Ser 1.19; Hemoglobin 15.1; Platelets 213; Potassium 3.6; Sodium 133    Recent Lipid Panel    Component Value Date/Time   CHOL 140 11/29/2020 1004   TRIG 127 11/29/2020 1004   HDL 54 11/29/2020 1004   CHOLHDL 2.6 11/29/2020 1004   LDLCALC 64 11/29/2020 1004    Physical Exam:  VS:  BP 125/66   Pulse 66   Ht 5' (1.524 m)   Wt 149 lb (67.6 kg)   LMP 10/20/1996   SpO2 98%   BMI 29.10 kg/m     Wt Readings from Last 3 Encounters:  07/23/21 149 lb (67.6 kg)  07/06/21 149 lb 9.6 oz (67.9 kg)  12/18/20 154 lb 5.2 oz (70 kg)     GEN: Well nourished, well developed in no acute distress HEENT: Normal NECK: No JVD; No carotid bruits LYMPHATICS: No lymphadenopathy CARDIAC: RRR, no murmurs, rubs, gallops RESPIRATORY:  Clear to auscultation without rales, wheezing or rhonchi  ABDOMEN: Soft, non-tender, non-distended MUSCULOSKELETAL:  No edema; No deformity  SKIN: Warm and dry NEUROLOGIC:  Alert and oriented x 3 PSYCHIATRIC:  Normal affect    ASSESSMENT:    1. Essential hypertension   2. Chronic systolic CHF (congestive heart failure) ([place])   3. Pericardial effusion     PLAN:    In order of problems listed above: Essential hypertension She states it has been running too low.  Occasionally she will have values below 120 and she feels washed out.  Because of this, we will stop her spironolactone.  Did explain that this was helpful for her underlying cardiomyopathy.  EF has improved however to 50%.  She will continue to monitor blood pressures closely at home and we will see her shortly back in clinic in about 1 month.    Chronic systolic CHF (congestive heart failure) (H[facility]) Last echocardiogram overall reassuring with EF of 45 to 50%.  NYHA class I.  She feels much better.  We are stopping her spironolactone because her blood pressures are too low she states.  Continue with carvedilol 12.5 mg twice a day.  Continue with lisinopril 20 mg twice a day.  Pericardial effusion Effusion have resolved.  Excellent.    Shared Decision Making/Informed  Consent    Follow-up: 1 month.  Medication Adjustments/Labs and Tests Ordered: Current medicines are reviewed at length with the patient today.  Concerns regarding medicines are outlined above.   No orders of the defined types were placed in this encounter.  No orders of the defined types were placed in this encounter.  Patient Instructions  Medication Instructions:  Please discontinue your Spironolactone. Continue all other medications as listed.  *If you need a refill on your cardiac medications before your next appointment, please call your pharmacy*  Follow-Up: At [facility], you and your health needs are our priority.  As part of our continuing mission to provide you with exceptional heart care, we have created designated Provider Care Teams.  These Care Teams include your primary Cardiologist (physician) and Advanced Practice Providers (APPs -  Physician Assistants and Nurse Practitioners) who all work together to provide you with the care you need, when you need it.  We recommend signing up for the patient portal called "MyChart".  Sign up information is provided on this After Visit Summary.  MyChart is used to connect with patients for Virtual Visits (Telemedicine).  Patients are able to view lab/test results, encounter notes, upcoming appointments, etc.  Non-urgent messages can be sent to your provider as well.   To learn more about what you can do with MyChart, go to [email or web address].    Your next appointment:   1 month(s)  The format for your next appointment:   In Person  Provider:   Candee Furbish, MD   Thank you for choosing [place]!!     [facility] Stumpf,acting  as a Education administrator for [employer], MD.,have documented all relevant documentation on the behalf of Candee Furbish, MD,as directed by  Candee Furbish, MD while in the presence of Candee Furbish, MD.  I, Candee Furbish, MD, have reviewed all documentation for this visit. The documentation on 07/23/21 for  the exam, diagnosis, procedures, and orders are all accurate and complete.   Signed, Candee Furbish, MD  07/23/2021 12:10 PM    [facility]

## 2021-07-23 NOTE — Assessment & Plan Note (Signed)
She states it has been running too low.  Occasionally she will have values below 120 and she feels washed out.  Because of this, we will stop her spironolactone.  Did explain that this was helpful for her underlying cardiomyopathy.  EF has improved however to 50%.  She will continue to monitor blood pressures closely at home and we will see her shortly back in clinic in about 1 month.

## 2021-08-21 ENCOUNTER — Ambulatory Visit (INDEPENDENT_AMBULATORY_CARE_PROVIDER_SITE_OTHER): Payer: Medicare Other | Admitting: Cardiology

## 2021-08-21 ENCOUNTER — Other Ambulatory Visit: Payer: Self-pay

## 2021-08-21 ENCOUNTER — Encounter (HOSPITAL_BASED_OUTPATIENT_CLINIC_OR_DEPARTMENT_OTHER): Payer: Self-pay | Admitting: Cardiology

## 2021-08-21 DIAGNOSIS — E782 Mixed hyperlipidemia: Secondary | ICD-10-CM | POA: Diagnosis not present

## 2021-08-21 DIAGNOSIS — I5020 Unspecified systolic (congestive) heart failure: Secondary | ICD-10-CM | POA: Diagnosis not present

## 2021-08-21 DIAGNOSIS — I129 Hypertensive chronic kidney disease with stage 1 through stage 4 chronic kidney disease, or unspecified chronic kidney disease: Secondary | ICD-10-CM | POA: Diagnosis not present

## 2021-08-21 DIAGNOSIS — I10 Essential (primary) hypertension: Secondary | ICD-10-CM | POA: Diagnosis not present

## 2021-08-21 DIAGNOSIS — N183 Chronic kidney disease, stage 3 unspecified: Secondary | ICD-10-CM | POA: Diagnosis not present

## 2021-08-21 DIAGNOSIS — Z23 Encounter for immunization: Secondary | ICD-10-CM | POA: Diagnosis not present

## 2021-08-21 DIAGNOSIS — N2581 Secondary hyperparathyroidism of renal origin: Secondary | ICD-10-CM | POA: Diagnosis not present

## 2021-08-21 DIAGNOSIS — E785 Hyperlipidemia, unspecified: Secondary | ICD-10-CM | POA: Diagnosis not present

## 2021-08-21 MED ORDER — CARVEDILOL 25 MG PO TABS: 25.0000 mg | ORAL_TABLET | Freq: Two times a day (BID) | ORAL | 3 refills | Status: DC

## 2021-08-21 NOTE — Assessment & Plan Note (Signed)
Will increase coreg to 25mg  PO BID from 12.5 mg.  Stopped spironolactone 12.5mg  at last visit due to BP 100's - washed out.

## 2021-08-21 NOTE — Patient Instructions (Signed)
Medication Instructions:  Please increase your Carvedilol to 25 mg twice a day. Continue all other medications as listed.  *If you need a refill on your cardiac medications before your next appointment, please call your pharmacy*  Follow-Up: At [facility], you and your health needs are our priority.  As part of our continuing mission to provide you with exceptional heart care, we have created designated Provider Care Teams.  These Care Teams include your primary Cardiologist (physician) and Advanced Practice Providers (APPs -  Physician Assistants and Nurse Practitioners) who all work together to provide you with the care you need, when you need it.  We recommend signing up for the patient portal called "MyChart".  Sign up information is provided on this After Visit Summary.  MyChart is used to connect with patients for Virtual Visits (Telemedicine).  Patients are able to view lab/test results, encounter notes, upcoming appointments, etc.  Non-urgent messages can be sent to your provider as well.   To learn more about what you can do with MyChart, go to [email or web address].    Your next appointment:   2 month(s)  The format for your next appointment:   In Person  Provider:   Candee Furbish, MD   Thank you for choosing [facility]!!

## 2021-08-21 NOTE — Progress Notes (Signed)
Cardiology Office Note:    Date:  08/21/2021   ID:  Carlis Stable Capo, DOB [birth year], MRN [id number]  PCP:  Donald Prose, MD  [facility] Cardiologist:  Candee Furbish, MD  [facility] Electrophysiologist:  None   Referring MD: Donald Prose, MD    History of Present Illness:    Carla Bennett is a 86 y.o. female here for the follow-up of congestive heart failure and hypertension.  Prior chemotherapy for breast cancer, pericardial effusion.    In review of prior notes:  She had an echo performed on 03/13/15 which at that time showed no evidence of pericardial effusion. Mild mitral and aortic regurgitation noted. Normal ejection fraction. This was pre-chemotherapy to breast cancer.   Moderate-sized pericardial effusion was personally viewed on CT scan from 02/07/16. Aortic atherosclerosis and mild coronary artery atherosclerosis was also noted. Pleural effusions were also noted.  On her echocardiogram originally, her ejection fraction was markedly reduced at 20-25%. She had been treated with Adriamycin. This also showed moderate pericardial effusion.   A subsequent follow-up echo 1 month later showed ejection fraction of 35%. Improved. Effusion was trace.   She still has GI issues for several years, ended up having a diet where she eliminated many fruits.  Dr. Henrene Pastor.  At her last appointment she noted her blood pressure was too low per her BP log. Typically she felt well with readings around 135-140/70s. However, below 865 systolic she felt "too wiped out to do anything." She reported lack of energy, muscle aches, and shortness of breath with minimal exertion. This was limiting her physical exercise.  Today: Overall she appears well.  She presents a BP log today, and notes her readings have been more elevated lately on average. Her heart rate is averaging in the 60s-80s. This is on 12.5 mg carvedilol only. Spironolactone was previously stopped due to low blood pressures and  lethargic feelings.  She denies any palpitations, chest pain, or shortness of breath. No lightheadedness, headaches, syncope, orthopnea, PND, lower extremity edema or exertional symptoms.  She also sees Dr. Markus Jarvis with nephrology.   Past Medical History:  Diagnosis Date   Allergy    Aortic regurgitation    Arthritis    hands, neck   Basal cell carcinoma    Benign neoplasm of colon    Breast cancer of upper-outer quadrant of left female breast ([place]) 02/23/2015   4'16-left breast cancer-surgery, chemo, radiation-[name] follows every 3 months   Cataract    beginning stages-monitoring by MD   Cholelithiasis    resolved with surgery   Chronic systolic CHF (congestive heart failure) ([place]) 02/26/2016   A.  Echo 02/21/16:  Mild LVH, EF 20-25%, diff HK, Gr 3 DD, mod AI, mod to severe MR, mod LAE, mod redcued RVSF, mild RAE, mod TR, PASP 53 mmHg, small pericardial effusion, mod L pleural effusion  //  B. 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   OA (osteoarthritis) of hip    right   Other issue of medical certificates    uterine myomata   Parathyroid abnormality ([place])    being followed Dr. Barbette Hair   Radiation 10/01/15-11/16/15   left breast axillary and supraclavicular reigion 45 gray, left breast 50.4 gray, lumpectomy cavity boost 10 gray   Renal disease 3/16   Stage 3 kidney  disease-seeing Dr Buddy Duty and Dr Marval Regal   Uterine prolaps    resolved after surgery   Vitamin D deficiency     Past Surgical History:  Procedure Laterality Date   BREAST LUMPECTOMY Left 08/2015   CHOLECYSTECTOMY     COLONOSCOPY  01/18/2013   [name]   COLONOSCOPY N/A 06/26/2016   Procedure: COLONOSCOPY;  Surgeon: Irene Shipper, MD;  Location: [facility];  Service: Endoscopy;  Laterality: N/A;   CYSTOCELE REPAIR  08/2010   Rectocele, vault prolapse   DILATION AND CURETTAGE OF UTERUS     x2   PORT-A-CATH REMOVAL Right 08/23/2015   Procedure: REMOVAL PORT-A-CATH;  Surgeon: Erroll Luna, MD;  Location: [place];  Service: General;  Laterality: Right;   PORTACATH PLACEMENT Right 03/20/2015   Procedure: INSERTION PORT-A-CATH;  Surgeon: Erroll Luna, MD;  Location: [place];  Service: General;  Laterality: Right;   RADIOACTIVE SEED GUIDED PARTIAL MASTECTOMY WITH AXILLARY SENTINEL LYMPH NODE BIOPSY Left 08/23/2015   Procedure: LEFT BREAST PARTIAL MASTECTOMY WITH SENTINEL LYMPH NODE [place];  Surgeon: Erroll Luna, MD;  Location: [place];  Service: General;  Laterality: Left;   TONSILLECTOMY AND ADENOIDECTOMY     TOTAL VAGINAL HYSTERECTOMY     secondary to prolapse, ovaries not removed    Current Medications: Current Meds  Medication Sig   B Complex Vitamins (VITAMIN B COMPLEX PO) Take 1 tablet by mouth daily.    calcium carbonate (TUMS) 500 MG chewable tablet Chew 2 tablets (400 mg of elemental calcium total) by mouth 3 times/day as needed-between meals & bedtime for indigestion or heartburn.   carvedilol (COREG) 25 MG tablet Take 1 tablet (25 mg total) by mouth 2 (two) times daily.   furosemide (LASIX) 20 MG tablet Take 1 tablet (20 mg total) by mouth daily.   hyoscyamine (LEVSIN) 0.125 MG tablet TAKE 1-2 TABLET EVERY 4-6 HOURS AS NEEDED FOR ABDOMINAL PAIN   lidocaine (LIDODERM) 5 % Place 1 patch onto the skin daily. Remove & Discard patch within 12  hours or as directed by MD   lisinopril (ZESTRIL) 40 MG tablet Take 20 mg by mouth 2 (two) times daily.   loratadine (CLARITIN) 10 MG tablet Take 10 mg by mouth daily.   metroNIDAZOLE (METROCREAM) 0.75 % cream Apply 1 application topically daily.    rosuvastatin (CRESTOR) 10 MG tablet Take 1 tablet (10 mg total) by mouth 2 (two) times a week. As ordered by Dr Nancy Fetter   triamcinolone cream (KENALOG) 0.1 % SMARTSIG:1 Application Topical 2-3 Times Daily   [DISCONTINUED] carvedilol (COREG) 12.5 MG tablet Take 1 tablet (12.5 mg total) by mouth 2 (two) times daily with a meal. 1 tab in AM and 1 tab in PM     Allergies:   Patient has no known allergies.   Social History   Socioeconomic History   Marital status: Widowed    Spouse name:  Not on file   Number of children: 4   Years of education: Not on file   Highest education level: Not on file  Occupational History   Occupation: retired  Tobacco Use   Smoking status: Never   Smokeless tobacco: Never  Vaping Use   Vaping Use: Never used  Substance and Sexual Activity   Alcohol use: Yes    Comment: occasionally  wine/beer   Drug use: No   Sexual activity: Never    Birth control/protection: Surgical    Comment: Vaginal hysterectomy  Other Topics Concern   Not on file  Social History Narrative   Not on file   Social Determinants of Health   Financial Resource Strain: Not on file  Food Insecurity: Not on file  Transportation Needs: Not on file  Physical Activity: Not on file  Stress: Not on file  Social Connections: Not on file     Family History: The patient's family history includes Alzheimer's disease in her mother; Breast cancer (age of onset: 8) in her sister; Cancer in her maternal uncle; Cancer (age of onset: 78) in her cousin and maternal uncle; Lung cancer in her mother; Lung cancer (age of onset: 99) in her father; Multiple sclerosis in her sister; Parkinson's disease in her mother; Prostate cancer (age of onset: 59) in her  maternal uncle. There is no history of Colon cancer, Esophageal cancer, Rectal cancer, Stomach cancer, or Colon polyps.  ROS:   Please see the history of present illness. All other systems are reviewed and negative.   EKGs/Labs/Other Studies Reviewed:    CT Chest 12/18/2020: FINDINGS: Cardiovascular: No significant vascular findings. Normal heart size. No pericardial effusion.   Mediastinum/Nodes: No axillary supraclavicular adenopathy. No mediastinal adenopathy. Trachea and esophagus are normal. Small hiatal hernia.   Lungs/Pleura: No pneumothorax or pulmonary contusion. No pleural fluid. No aspiration. Mild RIGHT lower lobe bronchial thickening.   Upper Abdomen: Limited view of the liver, kidneys, pancreas are unremarkable. Normal adrenal glands.   Musculoskeletal: No rib fracture.  No spine fracture   IMPRESSION: 1. No evidence of thoracic trauma. 2. Hiatal hernia. 3. Mild bronchitis in the RIGHT lower lobe  Echo TTE 01/21/2017: - Left ventricle: The cavity size was normal. Wall thickness was    normal. Systolic function was mildly reduced. The estimated    ejection fraction was in the range of 45% to 50%. Inferolateral    hypokinesis. Abnormal GLPSS at -14% wtih inferior strain    abnormality. Doppler parameters are consistent with abnormal left    ventricular relaxation (grade 1 diastolic dysfunction). The E/e&'    ratio is >15, suggesting elevated LV filling pressure.  - Aortic valve: Trileaflet. Sclerosis without stenosis. There was    trivial regurgitation.  - Left atrium: The atrium was normal in size.  - Inferior vena cava: The vessel was normal in size. The    respirophasic diameter changes were in the normal range (>= 50%),    consistent with normal central venous pressure.   Impressions:  - Compared to a prior study in 2017, the LVEF has improved to    45-50% - there is inferior and inferolateral hypokinesis.   EKG:  EKG is personally reviewed and  interpreted. 08/21/2021: EKG was not ordered today. 07/23/2021: EKG was not ordered.  Recent Labs: 07/06/2021: ALT 16; BUN 21; Creatinine, Ser 1.19; Hemoglobin 15.1; Platelets 213; Potassium 3.6; Sodium 133   Recent Lipid Panel    Component Value Date/Time   CHOL 140 11/29/2020 1004  TRIG 127 11/29/2020 1004   HDL 54 11/29/2020 1004   CHOLHDL 2.6 11/29/2020 1004   LDLCALC 64 11/29/2020 1004    Physical Exam:     VS:  BP 140/60 (BP Location: Left Arm, Patient Position: Sitting, Cuff Size: Normal)   Pulse 69   Ht 5' (1.524 m)   Wt 153 lb (69.4 kg)   LMP 10/20/1996   SpO2 96%   BMI 29.88 kg/m     Wt Readings from Last 3 Encounters:  08/21/21 153 lb (69.4 kg)  07/23/21 149 lb (67.6 kg)  07/06/21 149 lb 9.6 oz (67.9 kg)     GEN: Well nourished, well developed in no acute distress HEENT: Normal NECK: No JVD; No carotid bruits LYMPHATICS: No lymphadenopathy CARDIAC: RRR, no murmurs, rubs, gallops RESPIRATORY:  Clear to auscultation without rales, wheezing or rhonchi  ABDOMEN: Soft, non-tender, non-distended MUSCULOSKELETAL:  No edema; No deformity  SKIN: Warm and dry NEUROLOGIC:  Alert and oriented x 3 PSYCHIATRIC:  Normal affect    ASSESSMENT:    1. Essential hypertension   2. Mixed hyperlipidemia      PLAN:    In order of problems listed above:  Essential hypertension Will increase coreg to 69m PO BID from 12.5 mg.  Stopped spironolactone 12.574mat last visit due to BP 100's - washed out.    Mixed hyperlipidemia She was having trouble with daily Crestor.  She is now taking Crestor twice a week 10 mg.  Prior LDL was 64 in February 2022.  Excellent.  No myalgias currently.    Follow-up: 2 months.  Medication Adjustments/Labs and Tests Ordered: Current medicines are reviewed at length with the patient today.  Concerns regarding medicines are outlined above.   No orders of the defined types were placed in this encounter.  Meds ordered this encounter   Medications   carvedilol (COREG) 25 MG tablet    Sig: Take 1 tablet (25 mg total) by mouth 2 (two) times daily.    Dispense:  180 tablet    Refill:  3    Patient Instructions  Medication Instructions:  Please increase your Carvedilol to 25 mg twice a day. Continue all other medications as listed.  *If you need a refill on your cardiac medications before your next appointment, please call your pharmacy*  Follow-Up: [facility] your [facility] needs are our priority.  As part of our continuing mission to provide you with exceptional heart care, we have created designated Provider Care Teams.  These Care Teams include your primary Cardiologist (physician) and Advanced Practice Providers (APPs -  Physician Assistants and Nurse Practitioners) who all work together to provide you with the care you need, when you need it.  We recommend signing up for the patient portal called "MyChart".  Sign up information is provided on this After Visit Summary.  MyChart is used to connect with patients for Virtual Visits (Telemedicine).  Patients are able to view lab/test results, encounter notes, upcoming appointments, etc.  Non-urgent messages can be sent to your provider as well.   To learn more about what you can do with MyChart, go to [email or web address]   Your next appointment:   2 month(s)  The format for your next appointment:   In Person  Provider:   MaCandee FurbishMD   Thank you for choosing [facility]     I,Mathew Stumpf,acting as a scribe for MaCandee FurbishMD.,have documented all relevant documentation on the behalf of MaCandee FurbishMD,as  directed by  Candee Furbish, MD while in the presence of Candee Furbish, MD.  I, Candee Furbish, MD, have reviewed all documentation for this visit. The documentation on 08/21/21 for the exam, diagnosis, procedures, and orders are all accurate and complete.   Signed, Candee Furbish, MD  08/21/2021 4:11 PM    [facility]  [facility]

## 2021-08-21 NOTE — Assessment & Plan Note (Signed)
She was having trouble with daily Crestor.  She is now taking Crestor twice a week 10 mg.  Prior LDL was 64 in February 2022.  Excellent.  No myalgias currently.

## 2021-09-07 ENCOUNTER — Encounter (HOSPITAL_BASED_OUTPATIENT_CLINIC_OR_DEPARTMENT_OTHER): Payer: Self-pay | Admitting: Urology

## 2021-09-07 ENCOUNTER — Emergency Department (HOSPITAL_BASED_OUTPATIENT_CLINIC_OR_DEPARTMENT_OTHER)
Admission: EM | Admit: 2021-09-07 | Discharge: 2021-09-07 | Disposition: A | Discharge status: Home / Self Care | Payer: Medicare Other | Attending: Emergency Medicine | Admitting: Emergency Medicine

## 2021-09-07 ENCOUNTER — Other Ambulatory Visit: Payer: Self-pay

## 2021-09-07 DIAGNOSIS — Z853 Personal history of malignant neoplasm of breast: Secondary | ICD-10-CM | POA: Diagnosis not present

## 2021-09-07 DIAGNOSIS — N183 Chronic kidney disease, stage 3 unspecified: Secondary | ICD-10-CM | POA: Diagnosis not present

## 2021-09-07 DIAGNOSIS — Z79899 Other long term (current) drug therapy: Secondary | ICD-10-CM | POA: Diagnosis not present

## 2021-09-07 DIAGNOSIS — Z85030 Personal history of malignant carcinoid tumor of large intestine: Secondary | ICD-10-CM | POA: Diagnosis not present

## 2021-09-07 DIAGNOSIS — I5022 Chronic systolic (congestive) heart failure: Secondary | ICD-10-CM | POA: Diagnosis not present

## 2021-09-07 DIAGNOSIS — I130 Hypertensive heart and chronic kidney disease with heart failure and stage 1 through stage 4 chronic kidney disease, or unspecified chronic kidney disease: Secondary | ICD-10-CM | POA: Insufficient documentation

## 2021-09-07 DIAGNOSIS — N390 Urinary tract infection, site not specified: Secondary | ICD-10-CM

## 2021-09-07 DIAGNOSIS — R300 Dysuria: Secondary | ICD-10-CM | POA: Diagnosis present

## 2021-09-07 LAB — URINALYSIS, MICROSCOPIC (REFLEX)

## 2021-09-07 LAB — URINALYSIS, ROUTINE W REFLEX MICROSCOPIC
Bilirubin Urine: NEGATIVE
Glucose, UA: NEGATIVE mg/dL
Ketones, ur: NEGATIVE mg/dL
Nitrite: NEGATIVE
Protein, ur: NEGATIVE mg/dL
Specific Gravity, Urine: <1.005 — ABNORMAL LOW (ref 1.005–1.030)
pH: 5.5 (ref 5.0–8.0)

## 2021-09-07 MED ORDER — CEPHALEXIN 500 MG PO CAPS: 500.0000 mg | ORAL_CAPSULE | Freq: Two times a day (BID) | ORAL | 0 refills | Status: AC

## 2021-09-07 NOTE — ED Provider Notes (Signed)
[facility] EMERGENCY DEPARTMENT Provider Note   CSN: [id number] Arrival date & time: 09/07/21  1416     History Chief Complaint  Patient presents with   Dysuria    Carla Bennett is a 86 y.o. female.  The history is provided by the patient.  Dysuria Pain quality:  Aching Pain severity:  Mild Onset quality:  Gradual Progression:  Resolved Chronicity:  New Relieved by:  Nothing Worsened by:  Nothing Urinary symptoms: discolored urine and hematuria (?)   Urinary symptoms: no foul-smelling urine, no frequent urination, no hesitancy and no bladder incontinence   Associated symptoms: no abdominal pain, no fever, no flank pain, no genital lesions, no nausea and no vomiting       Past Medical History:  Diagnosis Date   Allergy    Aortic regurgitation    Arthritis    hands, neck   Basal cell carcinoma    Benign neoplasm of colon    Breast cancer of upper-outer quadrant of left female breast ([place]) 02/23/2015   4'16-left breast cancer-surgery, chemo, radiation-[name] follows every 3 months   Cataract    beginning stages-monitoring by MD   Cholelithiasis    resolved with surgery   Chronic systolic CHF (congestive heart failure) ([place]) 02/26/2016   A.  Echo 02/21/16:  Mild LVH, EF 20-25%, diff HK, Gr 3 DD, mod AI, mod to severe MR, mod LAE, mod redcued RVSF, mild RAE, mod TR, PASP 53 mmHg, small pericardial effusion, mod L pleural effusion  //  B. Echo 6/17:  Mild LVH EF 30-35%, diffuse HK, grade 1 diastolic dysfunction, trivial AI, MAC, mild LAE, normal RVSF, trivial pericardial effusion     Cystocele    Diverticulitis    Diverticulosis of colon (without mention of hemorrhage)    Gout    resolved -   Hearing loss    some hearing loss in right ear- no hearing aids   Heart murmur    Hematuria    negative urology work up   History of blood transfusion 1971   x 1 with D&C -    Hx of abnormal cervical Pap smear    LEEP-CIN I, negative margins and ECC    Hypercalcemia    Hypertension    Mitral regurgitation    Neuromuscular disorder (HCC)    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   OA (osteoarthritis) of hip    right   Other issue of medical certificates    uterine myomata   Parathyroid abnormality ([place])    being followed Dr. Barbette Hair   Radiation 10/01/15-11/16/15   left breast axillary and supraclavicular reigion 45 gray, left breast 50.4 gray, lumpectomy cavity boost 10 gray   Renal disease 3/16   Stage 3 kidney disease-seeing Dr Buddy Duty and Dr Marval Regal   Uterine prolaps    resolved after surgery   Vitamin D deficiency     Patient Active Problem List   Diagnosis Date Noted   Mixed hyperlipidemia 08/21/2021   Diverticulosis of colon without hemorrhage    Chronic systolic CHF (congestive heart failure) ([place]) 02/26/2016   Dilated cardiomyopathy ([place]) 02/26/2016   CKD (chronic kidney disease) 02/26/2016   Essential hypertension 07/26/2015   Family history of malignant neoplasm of gastrointestinal tract 03/08/2015   Breast cancer of upper-outer quadrant of left female breast ([place]) 02/23/2015   Hematochezia 01/06/2013    Past Surgical History:  Procedure Laterality Date   BREAST LUMPECTOMY Left 08/2015   CHOLECYSTECTOMY     COLONOSCOPY  01/18/2013   [name]   COLONOSCOPY N/A 06/26/2016   Procedure: COLONOSCOPY;  Surgeon: Irene Shipper, MD;  [facility];  Service: Endoscopy;  Laterality: N/A;   CYSTOCELE REPAIR  08/2010   Rectocele, vault prolapse   DILATION AND CURETTAGE OF UTERUS     x2   PORT-A-CATH REMOVAL Right 08/23/2015   Procedure: REMOVAL PORT-A-CATH;  Surgeon: Erroll Luna, MD;  Location: [place];  Service: General;  Laterality: Right;   PORTACATH PLACEMENT Right 03/20/2015   Procedure: INSERTION PORT-A-CATH;  Surgeon: Erroll Luna, MD;  Location: [place];  Service: General;  Laterality: Right;   RADIOACTIVE SEED GUIDED PARTIAL MASTECTOMY WITH AXILLARY SENTINEL LYMPH NODE BIOPSY Left  08/23/2015   Procedure: LEFT BREAST PARTIAL MASTECTOMY WITH SENTINEL LYMPH NODE [place];  Surgeon: Erroll Luna, MD;  Location: [place];  Service: General;  Laterality: Left;   TONSILLECTOMY AND ADENOIDECTOMY     TOTAL VAGINAL HYSTERECTOMY     secondary to prolapse, ovaries not removed     OB History     Gravida  6   Para  4   Term      Preterm      AB  2   Living  4      SAB  2   IAB      Ectopic      Multiple      Live Births              Family History  Problem Relation Age of Onset   Lung cancer Father 27   Parkinson's disease Mother    Alzheimer's disease Mother        ? diag   Lung cancer Mother    Breast cancer Sister 33   Multiple sclerosis Sister    Prostate cancer Maternal Uncle 60   Cancer Maternal Uncle        stomache   Cancer Maternal Uncle 65       GI cancer - ? stomach   Cancer Cousin 84       mat female first cousin (son of mat uncle with GI cancer) with ureter cancer   Colon cancer Neg Hx    Esophageal cancer Neg Hx    Rectal cancer Neg Hx    Stomach cancer Neg Hx    Colon polyps Neg Hx     Social History   Tobacco Use   Smoking status: Never   Smokeless tobacco: Never  Vaping Use   Vaping Use: Never used  Substance Use Topics   Alcohol use: Yes    Comment: occasionally  wine/beer   Drug use: No    Home Medications Prior to Admission medications   Medication Sig Start Date End Date Taking? Authorizing Provider  B Complex Vitamins (VITAMIN B COMPLEX PO) Take 1 tablet by mouth daily.    Yes [provider]  calcium carbonate (TUMS) 500 MG chewable tablet Chew 2 tablets (400 mg of elemental calcium total) by mouth 3 times/day as needed-between meals & bedtime for indigestion or heartburn. 11/17/18  Yes Nicholas Lose, MD  carvedilol (COREG) 25 MG tablet Take 1 tablet (25 mg total) by mouth 2 (two) times daily. 08/21/21  Yes Jerline Pain, MD  cephALEXin (KEFLEX) 500 MG capsule Take 1 capsule (500 mg  total) by mouth 2 (two) times daily for 5 days. 09/07/21 09/12/21 Yes Tuere Nwosu, DO  furosemide (LASIX) 20 MG tablet Take 1 tablet (20 mg total) by mouth daily. 01/12/17  Yes Jerline Pain, MD  hyoscyamine (LEVSIN) 0.125 MG tablet TAKE 1-2 TABLET EVERY 4-6 HOURS AS NEEDED FOR ABDOMINAL PAIN 07/09/20  Yes Levin Erp, PA  lidocaine (LIDODERM) 5 % Place 1 patch onto the skin daily. Remove & Discard patch within 12 hours or as directed by MD   Yes [provider]  lisinopril (ZESTRIL) 40 MG tablet Take 20 mg by mouth 2 (two) times daily.   Yes [provider]  loratadine (CLARITIN) 10 MG tablet Take 10 mg by mouth daily.   Yes [provider]  metroNIDAZOLE (METROCREAM) 0.75 % cream Apply 1 application topically daily.  11/27/19  Yes [provider]  rosuvastatin (CRESTOR) 10 MG tablet Take 1 tablet (10 mg total) by mouth 2 (two) times a week. As ordered by Dr Nancy Fetter 12/06/20 12/06/21 Yes Nicholas Lose, MD  triamcinolone cream (KENALOG) 0.1 % SMARTSIG:1 Application Topical 2-3 Times Daily 03/27/20  Yes [provider]    Allergies    Patient has no known allergies.  Review of Systems   Review of Systems  Constitutional:  Negative for fever.  Gastrointestinal:  Negative for abdominal pain, nausea and vomiting.  Genitourinary:  Positive for dysuria and hematuria. Negative for decreased urine volume, difficulty urinating, dyspareunia, enuresis, flank pain, frequency, genital sores and menstrual problem.   Physical Exam Updated Vital Signs BP (!) 148/77 (BP Location: Right Arm)   Pulse 72   Temp 98.1 F (36.7 C) (Oral)   Resp 18   Ht 5' (1.524 m)   Wt 69.4 kg   LMP 10/20/1996   SpO2 93%   BMI 29.88 kg/m   Physical Exam Constitutional:      General: She is not in acute distress.    Appearance: She is not ill-appearing.  Cardiovascular:     Pulses: Normal pulses.  Pulmonary:     Effort: Pulmonary effort is normal.  Abdominal:      General: Abdomen is flat. There is no distension.     Tenderness: There is no abdominal tenderness.  Neurological:     Mental Status: She is alert.    ED Results / Procedures / Treatments   Labs (all labs ordered are listed, but only abnormal results are displayed) Labs Reviewed  URINALYSIS, ROUTINE W REFLEX MICROSCOPIC - Abnormal; Notable for the following components:      Result Value   Specific Gravity, Urine <1.005 (*)    Hgb urine dipstick TRACE (*)    Leukocytes,Ua SMALL (*)    All other components within normal limits  URINALYSIS, MICROSCOPIC (REFLEX) - Abnormal; Notable for the following components:   Bacteria, UA FEW (*)    All other components within normal limits  URINE CULTURE    EKG None  Radiology No results found.  Procedures Procedures   Medications Ordered in ED Medications - No data to display  ED Course  I have reviewed the triage vital signs and the nursing notes.  Pertinent labs & imaging results that were available during my care of the patient were reviewed by me and considered in my medical decision making (see chart for details).    MDM Rules/Calculators/A&P                           Carla Bennett is here with painful urination and maybe blood in urine. No fever, normal vitals. Urine was darker this morning. No abdominal pain and discomfort currently. No smoking hx or hx of  kidney stones. Very well appearing, normal exam, no abdominal tenderness. U/A with leukocytes and will send urine cx and treat with antibiotics. No obvious RBCs in urine. If continued hematuria, f/u with PCP. Discharged in good condition.  This chart was dictated using voice recognition software.  Despite best efforts to proofread,  errors can occur which can change the documentation meaning.   Final Clinical Impression(s) / ED Diagnoses Final diagnoses:  Lower urinary tract infectious disease    Rx / DC Orders ED Discharge Orders          Ordered     cephALEXin (KEFLEX) 500 MG capsule  2 times daily        09/07/21 [place], [place], DO 09/07/21 1622

## 2021-09-07 NOTE — ED Triage Notes (Signed)
Pain with urination that started 2 days ago, pt states blood noted in urine.    NAD A&O x 4

## 2021-09-09 LAB — URINE CULTURE: Culture: NO GROWTH

## 2021-09-29 ENCOUNTER — Other Ambulatory Visit: Payer: Self-pay | Admitting: Cardiology

## 2021-09-29 DIAGNOSIS — I420 Dilated cardiomyopathy: Secondary | ICD-10-CM

## 2021-10-01 ENCOUNTER — Other Ambulatory Visit: Payer: Self-pay | Admitting: Cardiology

## 2021-10-30 ENCOUNTER — Ambulatory Visit (INDEPENDENT_AMBULATORY_CARE_PROVIDER_SITE_OTHER): Payer: Medicare Other | Admitting: Cardiology

## 2021-10-30 ENCOUNTER — Other Ambulatory Visit: Payer: Self-pay

## 2021-10-30 ENCOUNTER — Encounter: Payer: Self-pay | Admitting: Cardiology

## 2021-10-30 DIAGNOSIS — I5022 Chronic systolic (congestive) heart failure: Secondary | ICD-10-CM

## 2021-10-30 DIAGNOSIS — I10 Essential (primary) hypertension: Secondary | ICD-10-CM

## 2021-10-30 DIAGNOSIS — E782 Mixed hyperlipidemia: Secondary | ICD-10-CM | POA: Diagnosis not present

## 2021-10-30 MED ORDER — SPIRONOLACTONE 25 MG PO TABS: 12.5000 mg | ORAL_TABLET | Freq: Every day | ORAL | 3 refills | Status: DC

## 2021-10-30 NOTE — Patient Instructions (Signed)
Medication Instructions:  Please start Spironolactone 25 mg - take 1/2 tablet daily. Continue all other medications as listed.  *If you need a refill on your cardiac medications before your next appointment, please call your pharmacy*  Follow-Up: At [facility], you and your health needs are our priority.  As part of our continuing mission to provide you with exceptional heart care, we have created designated Provider Care Teams.  These Care Teams include your primary Cardiologist (physician) and Advanced Practice Providers (APPs -  Physician Assistants and Nurse Practitioners) who all work together to provide you with the care you need, when you need it.  We recommend signing up for the patient portal called "MyChart".  Sign up information is provided on this After Visit Summary.  MyChart is used to connect with patients for Virtual Visits (Telemedicine).  Patients are able to view lab/test results, encounter notes, upcoming appointments, etc.  Non-urgent messages can be sent to your provider as well.   To learn more about what you can do with MyChart, go to [email or web address].    Your next appointment:   3 month(s)  The format for your next appointment:   In Person  Provider:   Candee Furbish, MD     Thank you for choosing [facility]!!

## 2021-10-30 NOTE — Assessment & Plan Note (Addendum)
EF originally 20 to 25%.  On repeat 30 to 35%.  Most recently 60 to 50% in 2018 carvedilol at last visit was increased to 25 mg twice a day from 12.5.  Spironolactone was stopped, 12.5 mg because of blood pressures in the 100s, felt washed out.  Her blood pressure readings now are on the high side 150s at home.  I am going to add back the spironolactone 12.5 mg once a day.  She is also taking lisinopril 40 mg at bedtime.  She will continue to monitor closely.

## 2021-10-30 NOTE — Assessment & Plan Note (Signed)
Manipulating medications as above.

## 2021-10-30 NOTE — Progress Notes (Signed)
Cardiology Office Note:    Date:  10/30/2021   ID:  Carlis Stable Pignato, DOB [birth year], MRN [id number]  PCP:  Donald Prose, MD  [facility] Cardiologist:  Candee Furbish, MD  [facility] Electrophysiologist:  None   Referring MD: Donald Prose, MD    History of Present Illness:    Carla Bennett is a 87 y.o. female here for the follow-up of congestive heart failure and hypertension.  Prior chemotherapy for breast cancer, pericardial effusion.    In review of prior notes:  She had an echo performed on 03/13/15 which at that time showed no evidence of pericardial effusion. Mild mitral and aortic regurgitation noted. Normal ejection fraction. This was pre-chemotherapy to breast cancer.   Moderate-sized pericardial effusion was personally viewed on CT scan from 02/07/16. Aortic atherosclerosis and mild coronary artery atherosclerosis was also noted. Pleural effusions were also noted.  On her echocardiogram originally, her ejection fraction was markedly reduced at 20-25%. She had been treated with Adriamycin. This also showed moderate pericardial effusion.   A subsequent follow-up echo 1 month later showed ejection fraction of 35%. Improved. Effusion was trace.   She still has GI issues for several years, ended up having a diet where she eliminated many fruits.  Dr. Henrene Pastor.  At her last appointment she noted her blood pressure was too low per her BP log. Typically she felt well with readings around 135-140/70s. However, below 361 systolic she felt "too wiped out to do anything." She reported lack of energy, muscle aches, and shortness of breath with minimal exertion. This was limiting her physical exercise.  Spironolactone was previously stopped due to low blood pressures and lethargic feelings.  Today, her blood pressure is stable at this visit.  She checks her blood pressure at home and keeps a log of her readings . Notes that the readings fluctuate at home.  She uses 40 mg  lisinopril every night and reports no problems.   She denies chest pain, shortness of breath, palpitations, lightheadedness, headaches, syncope, LE edema, orthopnea, PND.   Specialty Problems       Cardiology Problems   Chronic systolic CHF (congestive heart failure) ([place])    Echo 02/21/16:  Mild LVH, EF 20-25%  Echo 6/17:  Mild LVH EF 30-35% Echo 01/2017: E 45-50%      Mixed hyperlipidemia   Essential hypertension   Dilated cardiomyopathy ([place])     Past Medical History:  Diagnosis Date   Allergy    Aortic regurgitation    Arthritis    hands, neck   Basal cell carcinoma    Benign neoplasm of colon    Breast cancer of upper-outer quadrant of left female breast ([place]) 02/23/2015   4'16-left breast cancer-surgery, chemo, radiation-[name] follows every 3 months   Cataract    beginning stages-monitoring by MD   Cholelithiasis    resolved with surgery   Chronic systolic CHF (congestive heart failure) ([place]) 02/26/2016   A.  Echo 02/21/16:  Mild LVH, EF 20-25%, diff HK, Gr 3 DD, mod AI, mod to severe MR, mod LAE, mod redcued RVSF, mild RAE, mod TR, PASP 53 mmHg, small pericardial effusion, mod L pleural effusion  //  B. Echo 6/17:  Mild LVH EF 30-35%, diffuse HK, grade 1 diastolic dysfunction, trivial AI, MAC, mild LAE, normal RVSF, trivial pericardial effusion     Cystocele    Diverticulitis    Diverticulosis of colon (without mention of hemorrhage)    Gout    resolved -  Hearing loss    some hearing loss in right ear- no hearing aids   Heart murmur    Hematuria    negative urology work up   History of blood transfusion 1971   x 1 with D&C -    Hx of abnormal cervical Pap smear    LEEP-CIN I, negative margins and ECC   Hypercalcemia    Hypertension    Mitral regurgitation    Neuromuscular disorder (HCC)    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   OA (osteoarthritis) of hip    right   Other issue of medical certificates    uterine myomata   Parathyroid abnormality ([place])     being followed Dr. Barbette Hair   Radiation 10/01/15-11/16/15   left breast axillary and supraclavicular reigion 45 gray, left breast 50.4 gray, lumpectomy cavity boost 10 gray   Renal disease 3/16   Stage 3 kidney disease-seeing Dr Buddy Duty and Dr Marval Regal   Uterine prolaps    resolved after surgery   Vitamin D deficiency     Past Surgical History:  Procedure Laterality Date   BREAST LUMPECTOMY Left 08/2015   CHOLECYSTECTOMY     COLONOSCOPY  01/18/2013   [name]   COLONOSCOPY N/A 06/26/2016   Procedure: COLONOSCOPY;  Surgeon: Irene Shipper, MD;  Location: [facility];  Service: Endoscopy;  Laterality: N/A;   CYSTOCELE REPAIR  08/2010   Rectocele, vault prolapse   DILATION AND CURETTAGE OF UTERUS     x2   PORT-A-CATH REMOVAL Right 08/23/2015   Procedure: REMOVAL PORT-A-CATH;  Surgeon: Erroll Luna, MD;  Location: [place];  Service: General;  Laterality: Right;   PORTACATH PLACEMENT Right 03/20/2015   Procedure: INSERTION PORT-A-CATH;  Surgeon: Erroll Luna, MD;  Location: [place];  Service: General;  Laterality: Right;   RADIOACTIVE SEED GUIDED PARTIAL MASTECTOMY WITH AXILLARY SENTINEL LYMPH NODE BIOPSY Left 08/23/2015   Procedure: LEFT BREAST PARTIAL MASTECTOMY WITH SENTINEL LYMPH NODE [place];  Surgeon: Erroll Luna, MD;  Location: [place];  Service: General;  Laterality: Left;   TONSILLECTOMY AND ADENOIDECTOMY     TOTAL VAGINAL HYSTERECTOMY     secondary to prolapse, ovaries not removed    Current Medications: Current Meds  Medication Sig   B Complex Vitamins (VITAMIN B COMPLEX PO) Take 1 tablet by mouth daily.    calcium carbonate (TUMS) 500 MG chewable tablet Chew 2 tablets (400 mg of elemental calcium total) by mouth 3 times/day as needed-between meals & bedtime for indigestion or heartburn.   carvedilol (COREG) 25 MG tablet Take 1 tablet (25 mg total) by mouth 2 (two) times daily.   furosemide (LASIX) 20 MG tablet Take 1 tablet (20 mg total) by  mouth daily.   hyoscyamine (LEVSIN) 0.125 MG tablet TAKE 1-2 TABLET EVERY 4-6 HOURS AS NEEDED FOR ABDOMINAL PAIN   lidocaine (LIDODERM) 5 % Place 1 patch onto the skin daily. Remove & Discard patch within 12 hours or as directed by MD   lisinopril (ZESTRIL) 40 MG tablet Take 40 mg by mouth at bedtime.   loratadine (CLARITIN) 10 MG tablet Take 10 mg by mouth daily.   metroNIDAZOLE (METROCREAM) 0.75 % cream Apply 1 application topically daily.    rosuvastatin (CRESTOR) 10 MG tablet Take 1 tablet (10 mg total) by mouth 2 (two) times a week. As ordered by Dr Nancy Fetter   spironolactone (ALDACTONE) 25 MG tablet Take 0.5 tablets (12.5 mg total) by mouth daily.   triamcinolone cream (KENALOG) 0.1 %  SMARTSIG:1 Application Topical 2-3 Times Daily     Allergies:   Patient has no known allergies.   Social History   Socioeconomic History   Marital status: Widowed    Spouse name: Not on file   Number of children: 4   Years of education: Not on file   Highest education level: Not on file  Occupational History   Occupation: retired  Tobacco Use   Smoking status: Never   Smokeless tobacco: Never  Vaping Use   Vaping Use: Never used  Substance and Sexual Activity   Alcohol use: Yes    Comment: occasionally  wine/beer   Drug use: No   Sexual activity: Never    Birth control/protection: Surgical    Comment: Vaginal hysterectomy  Other Topics Concern   Not on file  Social History Narrative   Not on file   Social Determinants of Health   Financial Resource Strain: Not on file  Food Insecurity: Not on file  Transportation Needs: Not on file  Physical Activity: Not on file  Stress: Not on file  Social Connections: Not on file     Family History: The patient's family history includes Alzheimer's disease in her mother; Breast cancer (age of onset: 73) in her sister; Cancer in her maternal uncle; Cancer (age of onset: 79) in her cousin and maternal uncle; Lung cancer in her mother; Lung cancer (age  of onset: 52) in her father; Multiple sclerosis in her sister; Parkinson's disease in her mother; Prostate cancer (age of onset: 60) in her maternal uncle. There is no history of Colon cancer, Esophageal cancer, Rectal cancer, Stomach cancer, or Colon polyps.  ROS:   Please see the history of present illness. All other systems are reviewed and negative.   EKGs/Labs/Other Studies Reviewed:    CT Chest 12/18/2020: FINDINGS: Cardiovascular: No significant vascular findings. Normal heart size. No pericardial effusion.   Mediastinum/Nodes: No axillary supraclavicular adenopathy. No mediastinal adenopathy. Trachea and esophagus are normal. Small hiatal hernia.   Lungs/Pleura: No pneumothorax or pulmonary contusion. No pleural fluid. No aspiration. Mild RIGHT lower lobe bronchial thickening.   Upper Abdomen: Limited view of the liver, kidneys, pancreas are unremarkable. Normal adrenal glands.   Musculoskeletal: No rib fracture.  No spine fracture   IMPRESSION: 1. No evidence of thoracic trauma. 2. Hiatal hernia. 3. Mild bronchitis in the RIGHT lower lobe  Echo TTE 01/21/2017: - Left ventricle: The cavity size was normal. Wall thickness was    normal. Systolic function was mildly reduced. The estimated    ejection fraction was in the range of 45% to 50%. Inferolateral    hypokinesis. Abnormal GLPSS at -14% wtih inferior strain    abnormality. Doppler parameters are consistent with abnormal left    ventricular relaxation (grade 1 diastolic dysfunction). The E/e&'    ratio is >15, suggesting elevated LV filling pressure.  - Aortic valve: Trileaflet. Sclerosis without stenosis. There was    trivial regurgitation.  - Left atrium: The atrium was normal in size.  - Inferior vena cava: The vessel was normal in size. The    respirophasic diameter changes were in the normal range (>= 50%),    consistent with normal central venous pressure.   Impressions:  - Compared to a prior study in 2017,  the LVEF has improved to    45-50% - there is inferior and inferolateral hypokinesis.   EKG:  EKG is personally reviewed and interpreted. 08/21/2021: EKG was not ordered today. 07/23/2021: EKG was not ordered.  Recent Labs: 07/06/2021: ALT 16; BUN 21; Creatinine, Ser 1.19; Hemoglobin 15.1; Platelets 213; Potassium 3.6; Sodium 133   Recent Lipid Panel    Component Value Date/Time   CHOL 140 11/29/2020 1004   TRIG 127 11/29/2020 1004   HDL 54 11/29/2020 1004   CHOLHDL 2.6 11/29/2020 1004   [place] 64 11/29/2020 1004    Physical Exam:     VS:  BP 130/60 (BP Location: Left Arm, Patient Position: Sitting, Cuff Size: Normal)    Pulse 72    Ht 5' (1.524 m)    Wt 152 lb (68.9 kg)    LMP 10/20/1996    SpO2 95%    BMI 29.69 kg/m     Wt Readings from Last 3 Encounters:  10/30/21 152 lb (68.9 kg)  09/07/21 153 lb (69.4 kg)  08/21/21 153 lb (69.4 kg)     GEN: Well nourished, well developed in no acute distress HEENT: Normal NECK: No JVD; No carotid bruits LYMPHATICS: No lymphadenopathy CARDIAC: RRR, no murmurs, rubs, gallops RESPIRATORY:  Clear to auscultation without rales, wheezing or rhonchi  ABDOMEN: Soft, non-tender, non-distended MUSCULOSKELETAL:  No edema; No deformity  SKIN: Warm and dry NEUROLOGIC:  Alert and oriented x 3 PSYCHIATRIC:  Normal affect    ASSESSMENT:    1. Chronic systolic CHF (congestive heart failure) ([place])   2. Mixed hyperlipidemia   3. Essential hypertension       PLAN:    In order of problems listed above:  Chronic systolic CHF (congestive heart failure) (HCC) EF originally 20 to 25%.  On repeat 30 to 35%.  Most recently 60 to 50% in 2018 carvedilol at last visit was increased to 25 mg twice a day from 12.5.  Spironolactone was stopped, 12.5 mg because of blood pressures in the 100s, felt washed out.  Her blood pressure readings now are on the high side 150s at home.  I am going to add back the spironolactone 12.5 mg once a day.  She is also  taking lisinopril 40 mg at bedtime.  She will continue to monitor closely.  Mixed hyperlipidemia Was having trouble with Crestor, now taking twice a week 10 mg a day.  Prior LDL was 64 in February 2022.  No myalgias.  Excellent.  Essential hypertension Manipulating medications as above.      Follow-up: 3 months.  Medication Adjustments/Labs and Tests Ordered: Current medicines are reviewed at length with the patient today.  Concerns regarding medicines are outlined above.   No orders of the defined types were placed in this encounter.   Meds ordered this encounter  Medications   spironolactone (ALDACTONE) 25 MG tablet    Sig: Take 0.5 tablets (12.5 mg total) by mouth daily.    Dispense:  45 tablet    Refill:  3    Patient Instructions  Medication Instructions:  Please start Spironolactone 25 mg - take 1/2 tablet daily. Continue all other medications as listed.  *If you need a refill on your cardiac medications before your next appointment, please call your pharmacy*  Follow-Up: At [facility], you and your health needs are our priority.  As part of our continuing mission to provide you with exceptional heart care, we have created designated Provider Care Teams.  These Care Teams include your primary Cardiologist (physician) and Advanced Practice Providers (APPs -  Physician Assistants and Nurse Practitioners) who all work together to provide you with the care you need, when you need it.  We recommend signing up for the  patient portal called "MyChart".  Sign up information is provided on this After Visit Summary.  MyChart is used to connect with patients for Virtual Visits (Telemedicine).  Patients are able to view lab/test results, encounter notes, upcoming appointments, etc.  Non-urgent messages can be sent to your provider as well.   To learn more about what you can do with MyChart, go to [email or web address].    Your next appointment:   3 month(s)  The format for  your next appointment:   In Person  Provider:   Candee Furbish, MD     Thank you for choosing [facility]!!       I,Zite Okoli,acting as a scribe for Candee Furbish, MD.,have documented all relevant documentation on the behalf of Candee Furbish, MD,as directed by  Candee Furbish, MD while in the presence of Candee Furbish, MD.   I, Candee Furbish, MD, have reviewed all documentation for this visit. The documentation on 10/30/21 for the exam, diagnosis, procedures, and orders are all accurate and complete.   Signed, Candee Furbish, MD  10/30/2021 2:03 PM    [name]

## 2021-10-30 NOTE — Assessment & Plan Note (Signed)
Was having trouble with Crestor, now taking twice a week 10 mg a day.  Prior LDL was 64 in February 2022.  No myalgias.  Excellent.

## 2021-10-31 ENCOUNTER — Encounter: Payer: Self-pay | Admitting: Cardiology

## 2021-12-05 DIAGNOSIS — L814 Other melanin hyperpigmentation: Secondary | ICD-10-CM | POA: Diagnosis not present

## 2021-12-05 DIAGNOSIS — X32XXXS Exposure to sunlight, sequela: Secondary | ICD-10-CM | POA: Diagnosis not present

## 2021-12-05 DIAGNOSIS — L820 Inflamed seborrheic keratosis: Secondary | ICD-10-CM | POA: Diagnosis not present

## 2021-12-10 NOTE — Assessment & Plan Note (Signed)
Left breast invasive ductal carcinoma grade 3 ER 0% PR 0% HER-2 negative Ki-67 75%, 2.9 cm mass with microcalcifications plus abnormal lymph nodes biopsy-proven to be breast cancer T2 N1 M0 stage IIB clinical stage °S/P Neoadjuvant Dose dense AC folll by Abraxane X 12  °left lumpectomy 08/23/2015 : Invasive ductal carcinoma grade 3, 2.8 cm, associated extensive DCIS with comedonecrosis, margins negative,1/2 sentinel nodes positive, ER 0%, PR 0% T2 N1 M0 stage IIB °Completed radiation therapy 11/16/2015 °  ° I offered the patient adjuvant Xeloda  But she refused. °  °Hypercalcemia: Ultrasound of the neck had revealed a parathyroid adenoma.  °Back pain: Bone scan 11/24/16: Normal °Fatigue related to prior chemotherapy: Much improved °  °Breast Cancer Surveillance: °1. Breast exam 12/11/2021: Benign °2. Mammogram  05/16/2021 at [facility]: Benign °3.  Bone density 04/30/2017 at [facility]: T score -0.7: Normal °  ° Return to clinic in 1 yr °  °

## 2021-12-10 NOTE — Progress Notes (Signed)
Patient Care Team: Donald Prose, MD as PCP - General (Family Medicine) Jerline Pain, MD as PCP - Cardiology (Cardiology) Erroll Luna, MD as Consulting Physician (General Surgery) Nicholas Lose, MD as Consulting Physician (Hematology and Oncology) Gery Pray, MD as Consulting Physician (Radiation Oncology) Mauro Kaufmann, RN as Registered Nurse Rockwell Germany, RN as Registered Nurse Sylvan Cheese, NP as Nurse Practitioner (Hematology and Oncology)  DIAGNOSIS:    ICD-10-CM   1. Malignant neoplasm of upper-outer quadrant of left breast in female, estrogen receptor negative ([place])  C50.412    Z17.1       SUMMARY OF ONCOLOGIC HISTORY: Oncology History  Breast cancer of upper-outer quadrant of left female breast ([place])  02/15/2015 Mammogram   Left breast increased density, mass is irregular with microcalcifications measuring 2.9 cm, cyst in the breast as well as abnormal enlarged lymph nodes   02/20/2015 Initial Diagnosis   Left breast biopsy: Invasive ductal carcinoma with DCIS, axillary lymph node biopsy positive, ER 0%, PR 0%, HER-2 negative ratio 1.13 with Ki-67 75%   03/08/2015 Procedure   OvaNext panel [name]) reveals no clinically significant variant at ATM, BARD1, BRCA1, BRCA2, BRIP1, CDH1, CHEK2, EPCAM, MLH1, MRE11A, MSH2, MSH6, MUTYH, NBN, NF1, PALB2, PMS2, PTEN, RAD50, RAD51C, RAD51D, SMARCA4, STK11, and TP53.    03/15/2015 PET scan   2.8 cm solid irregular left breast mass which is hypermetabolic and consistent with known left breast cancer. 2. Weakly positive axillary lymph nodes or equivocal   03/20/2015 Breast MRI   3.1 cm irregular enhancing mass located within the left breast at the 1 o'clock position; 1.6 cm Left axillary LN   03/20/2015 Clinical Stage   Stage IIB: T2 N1   03/22/2015 - 08/02/2015 Neo-Adjuvant Chemotherapy   Neo-adjuvant chemotherapy with dose dense Adriamycin and Cytoxan 4 followed by Abraxane weekly 12   08/06/2015 Breast MRI    Left breast now measures 0.9 x 0.8 x 1.9 cm (previously measured 3.0 x 3.1 x 1.8 cm). Dec size of abnormal Left axill LN   08/23/2015 Surgery   Left lumpectomy: Invasive ductal carcinoma grade 3, 2.8 cm, associated extensive DCIS with comedonecrosis, margins negative,1/2 sentinel nodes positive, ER 0%, PR 0%, HER2/neu repeated and remains negative (ratio 1.13)   08/23/2015 Pathologic Stage   Stage IIB: ypT2 ypN1   10/01/2015 - 11/16/2015 Radiation Therapy   Adjuvant RT: Left breast axillary and supraclavicular region, 45 gray in 25 fractions, the left breast received 3 additional treatments for a cumulative dose of 50.4 gray. Lumpectomy cavity boost 10 gray in 5 fractions   12/27/2015 Survivorship   Survivorship visit completed and copy of care plan provided to patient     CHIEF COMPLIANT: Follow-up of triple negative breast cancer  INTERVAL HISTORY: Carla Bennett is a 87 y.o. with above-mentioned history of triple negative left breast cancer treated with neoadjuvant chemotherapy, lumpectomy, adjuvant radiation, and who is currently on surveillance. Mammogram on 05/16/2021 showed no evidence of malignancy bilaterally. She presents to the clinic today for annual follow-up.  She denies any lumps or nodules in the breast.  She does have tenderness in the left breast.  ALLERGIES:  has No Known Allergies.  MEDICATIONS:  Current Outpatient Medications  Medication Sig Dispense Refill   B Complex Vitamins (VITAMIN B COMPLEX PO) Take 1 tablet by mouth daily.      calcium carbonate (TUMS) 500 MG chewable tablet Chew 2 tablets (400 mg of elemental calcium total) by mouth 3 times/day as needed-between meals &  bedtime for indigestion or heartburn.     carvedilol (COREG) 25 MG tablet Take 1 tablet (25 mg total) by mouth 2 (two) times daily. 180 tablet 3   furosemide (LASIX) 20 MG tablet Take 1 tablet (20 mg total) by mouth daily. 30 tablet 6   hyoscyamine (LEVSIN) 0.125 MG tablet TAKE 1-2 TABLET EVERY  4-6 HOURS AS NEEDED FOR ABDOMINAL PAIN 90 tablet 3   lidocaine (LIDODERM) 5 % Place 1 patch onto the skin daily. Remove & Discard patch within 12 hours or as directed by MD     lisinopril (ZESTRIL) 40 MG tablet Take 40 mg by mouth at bedtime.     loratadine (CLARITIN) 10 MG tablet Take 10 mg by mouth daily.     metroNIDAZOLE (METROCREAM) 0.75 % cream Apply 1 application topically daily.      rosuvastatin (CRESTOR) 10 MG tablet Take 1 tablet (10 mg total) by mouth 2 (two) times a week. As ordered by Dr Nancy Fetter     spironolactone (ALDACTONE) 25 MG tablet Take 0.5 tablets (12.5 mg total) by mouth daily. 45 tablet 3   triamcinolone cream (KENALOG) 0.1 % SMARTSIG:1 Application Topical 2-3 Times Daily     No current facility-administered medications for this visit.    PHYSICAL EXAMINATION: ECOG PERFORMANCE STATUS: 1 - Symptomatic but completely ambulatory  Vitals:   12/11/21 1133  BP: 131/64  Pulse: 77  Resp: 18  Temp: (!) 97.5 F (36.4 C)  SpO2: 99%   Filed Weights   12/11/21 1133  Weight: 152 lb 12.8 oz (69.3 kg)    BREAST: No palpable masses or nodules in either right or left breasts. No palpable axillary supraclavicular or infraclavicular adenopathy no breast tenderness or nipple discharge.  Tenderness to palpation of the left breast.  (exam performed in the presence of a chaperone)  LABORATORY DATA:  I have reviewed the data as listed CMP Latest Ref Rng & Units 07/06/2021 12/18/2020 11/29/2020  Glucose 70 - 99 mg/dL 119(H) 102(H) -  BUN 8 - 23 mg/dL 21 22 -  Creatinine 0.44 - 1.00 mg/dL 1.19(H) 1.30(H) -  Sodium 135 - 145 mmol/L 133(L) 138 -  Potassium 3.5 - 5.1 mmol/L 3.6 4.0 -  Chloride 98 - 111 mmol/L 101 103 -  CO2 22 - 32 mmol/L 23 24 -  Calcium 8.9 - 10.3 mg/dL 9.7 10.0 -  Total Protein 6.5 - 8.1 g/dL 6.5 - -  Total Bilirubin 0.3 - 1.2 mg/dL 0.6 - -  Alkaline Phos 38 - 126 U/L 57 - -  AST 15 - 41 U/L 24 - -  ALT 0 - 44 U/L 16 - 19    Lab Results  Component Value Date    WBC 5.7 07/06/2021   HGB 15.1 (H) 07/06/2021   HCT 44.5 07/06/2021   MCV 88.5 07/06/2021   PLT 213 07/06/2021   NEUTROABS 3.4 07/06/2021    ASSESSMENT & PLAN:  Breast cancer of upper-outer quadrant of left female breast Left breast invasive ductal carcinoma grade 3 ER 0% PR 0% HER-2 negative Ki-67 75%, 2.9 cm mass with microcalcifications plus abnormal lymph nodes biopsy-proven to be breast cancer T2 N1 M0 stage IIB clinical stage S/P Neoadjuvant Dose dense AC folll by Abraxane X 12  left lumpectomy 08/23/2015 : Invasive ductal carcinoma grade 3, 2.8 cm, associated extensive DCIS with comedonecrosis, margins negative,1/2 sentinel nodes positive, ER 0%, PR 0% T2 N1 M0 stage IIB Completed radiation therapy 11/16/2015    I offered the patient adjuvant Xeloda  But she refused.   Hypercalcemia: Ultrasound of the neck had revealed a parathyroid adenoma.  She now follows with nephrology and has been doing quite well with calcium levels in the normal range.   Breast Cancer Surveillance: 1. Breast exam 12/11/2021: Benign 2. Mammogram  05/16/2021 at [facility]: Benign 3.  B[facility] density 04/30/2017 at [facility]: T score -0.7: Normal    Return to clinic in 1 yr      No orders of the defined types were placed in this encounter.  The patient has a good understanding of the overall plan. she agrees with it. she will call with any problems that may develop before the next visit here.  Total time spent: 20 mins including face to face time and time spent for planning, charting and coordination of care  Rulon Eisenmenger, MD, MPH 12/11/2021  I, Thana Ates, am acting as scribe for Dr. Nicholas Lose.  I have reviewed the above documentation for accuracy and completeness, and I agree with the above.

## 2021-12-11 ENCOUNTER — Inpatient Hospital Stay: Payer: Medicare Other | Attending: Hematology and Oncology | Admitting: Hematology and Oncology

## 2021-12-11 ENCOUNTER — Other Ambulatory Visit: Payer: Self-pay

## 2021-12-11 DIAGNOSIS — D351 Benign neoplasm of parathyroid gland: Secondary | ICD-10-CM | POA: Diagnosis not present

## 2021-12-11 DIAGNOSIS — Z79899 Other long term (current) drug therapy: Secondary | ICD-10-CM | POA: Insufficient documentation

## 2021-12-11 DIAGNOSIS — Z923 Personal history of irradiation: Secondary | ICD-10-CM | POA: Diagnosis not present

## 2021-12-11 DIAGNOSIS — C50412 Malignant neoplasm of upper-outer quadrant of left female breast: Secondary | ICD-10-CM | POA: Insufficient documentation

## 2021-12-11 DIAGNOSIS — Z171 Estrogen receptor negative status [ER-]: Secondary | ICD-10-CM | POA: Diagnosis not present

## 2021-12-12 DIAGNOSIS — I129 Hypertensive chronic kidney disease with stage 1 through stage 4 chronic kidney disease, or unspecified chronic kidney disease: Secondary | ICD-10-CM | POA: Diagnosis not present

## 2021-12-12 DIAGNOSIS — E782 Mixed hyperlipidemia: Secondary | ICD-10-CM | POA: Diagnosis not present

## 2021-12-12 DIAGNOSIS — N1832 Chronic kidney disease, stage 3b: Secondary | ICD-10-CM | POA: Diagnosis not present

## 2021-12-12 DIAGNOSIS — I5022 Chronic systolic (congestive) heart failure: Secondary | ICD-10-CM | POA: Diagnosis not present

## 2021-12-24 DIAGNOSIS — Z23 Encounter for immunization: Secondary | ICD-10-CM | POA: Diagnosis not present

## 2022-01-22 DIAGNOSIS — H52203 Unspecified astigmatism, bilateral: Secondary | ICD-10-CM | POA: Diagnosis not present

## 2022-01-22 DIAGNOSIS — H25813 Combined forms of age-related cataract, bilateral: Secondary | ICD-10-CM | POA: Diagnosis not present

## 2022-01-22 DIAGNOSIS — H5203 Hypermetropia, bilateral: Secondary | ICD-10-CM | POA: Diagnosis not present

## 2022-02-17 NOTE — Progress Notes (Signed)
?Cardiology Office Note:   ? ?Date:  02/18/2022  ? ?ID:  Carla Bennett, DOB [birth year], MRN [id number] ? ?PCP:  Donald Prose, MD  ?[place] Cardiologist:  Candee Furbish, MD  ?[facility] Electrophysiologist:  None  ? ?Referring MD: Donald Prose, MD  ? ? ?History of Present Illness:   ? ?Carla Bennett is a 87 y.o. female here for the follow-up of chronic systolic heart failure, hypertension. ? ?Prior chemotherapy for breast cancer, pericardial effusion.   ? ?On her echocardiogram originally, her ejection fraction was markedly reduced at 20-25%. She had been treated with Adriamycin. This also showed moderate pericardial effusion. ? ?In review of prior notes: ?She had an echo performed on 03/13/15 which at that time showed no evidence of pericardial effusion. Mild mitral and aortic regurgitation noted. Normal ejection fraction. This was pre-chemotherapy to breast cancer. ?  ?Moderate-sized pericardial effusion was personally viewed on CT scan from 02/07/16. Aortic atherosclerosis and mild coronary artery atherosclerosis was also noted. Pleural effusions were also noted. ?  ?A subsequent follow-up echo 1 month later showed ejection fraction of 35%. Improved. Effusion was trace. ?  ?She still has GI issues for several years, ended up having a diet where she eliminated many fruits.  Dr. Henrene Pastor. ? ?Spironolactone was previously stopped due to low blood pressures and lethargic feelings, but she is now back on. ? ?At her last appointment her blood pressure was stable. She checked her blood pressure at home and kept a log of her readings. She noted the readings fluctuated at home. She was on 40 mg lisinopril every night; tolerated well.  ? ?Today: ?She notes that her blood pressure is too low today, it is 116/60 in clinic. She states she feels best with blood pressures around 119 systolic. When her BP is lower she feels like she is going to pass out. If her blood pressure was lower than it is today, she reports she  would begin to feel muscle aches and become short of breath. Per her BP log, there were a few low readings typically in the mid-afternoons. She notes this is when she usually feels the worst with no energy. ? ?Sometimes she forgets to take her night time doses until right before she goes to bed, instead of around 9 PM. ? ?Lately she admits to not walking much for exercise. She plans to work on this. ? ?Additionally, she endorses arthritis in her spine and shoulders ? ?She denies any palpitations, chest pain, or peripheral edema. No headaches, orthopnea, or PND. ? ? ?Specialty Problems   ? ?  ? Cardiology Problems  ? Chronic systolic CHF (congestive heart failure) ([place])  ?  Echo 02/21/16:  Mild LVH, EF 20-25%  ?Echo 6/17:  Mild LVH EF 30-35% ?Echo 01/2017: E 45-50% ?  ?  ? Mixed hyperlipidemia  ? Essential hypertension  ? Dilated cardiomyopathy ([place])  ?  ? ?Past Medical History:  ?Diagnosis Date  ? Allergy   ? Aortic regurgitation   ? Arthritis   ? hands, neck  ? Basal cell carcinoma   ? Benign neoplasm of colon   ? Breast cancer of upper-outer quadrant of left female breast ([place]) 02/23/2015  ? 4'16-left breast cancer-surgery, chemo, radiation-[name] follows every 3 months  ? Cataract   ? beginning stages-monitoring by MD  ? Cholelithiasis   ? resolved with surgery  ? Chronic systolic CHF (congestive heart failure) ([place]) 02/26/2016  ? A.  Echo 02/21/16:  Mild LVH, EF 20-25%, diff HK,  Gr 3 DD, mod AI, mod to severe MR, mod LAE, mod redcued RVSF, mild RAE, mod TR, PASP 53 mmHg, small pericardial effusion, mod L pleural effusion  //  B. Echo 6/17:  Mild LVH EF 30-35%, diffuse HK, grade 1 diastolic dysfunction, trivial AI, MAC, mild LAE, normal RVSF, trivial pericardial effusion    ? Cystocele   ? Diverticulitis   ? Diverticulosis of colon (without mention of hemorrhage)   ? Gout   ? resolved -  ? Hearing loss   ? some hearing loss in right ear- no hearing aids  ? Heart murmur   ? Hematuria   ? negative urology work up  ? History  of blood transfusion 1971  ? x 1 with D&C -   ? Hx of abnormal cervical Pap smear   ? LEEP-CIN I, negative margins and ECC  ? Hypercalcemia   ? Hypertension   ? Mitral regurgitation   ? Neuromuscular disorder ([place])   ? 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  ? OA (osteoarthritis) of hip   ? right  ? Other issue of medical certificates   ? uterine myomata  ? Parathyroid abnormality ([place])   ? being followed Dr. Barbette Hair  ? Radiation 10/01/15-11/16/15  ? left breast axillary and supraclavicular reigion 45 gray, left breast 50.4 gray, lumpectomy cavity boost 10 gray  ? Renal disease 3/16  ? Stage 3 kidney disease-seeing Dr Buddy Duty and Dr Marval Regal  ? Uterine prolaps   ? resolved after surgery  ? Vitamin D deficiency   ? ? ?Past Surgical History:  ?Procedure Laterality Date  ? BREAST LUMPECTOMY Left 08/2015  ? CHOLECYSTECTOMY    ? COLONOSCOPY  01/18/2013  ? [name]  ? COLONOSCOPY N/A 06/26/2016  ? Procedure: COLONOSCOPY;  Surgeon: Irene Shipper, MD;  Location: [facility];  Service: Endoscopy;  Laterality: N/A;  ? CYSTOCELE REPAIR  08/2010  ? Rectocele, vault prolapse  ? DILATION AND CURETTAGE OF UTERUS    ? x2  ? PORT-A-CATH REMOVAL Right 08/23/2015  ? Procedure: REMOVAL PORT-A-CATH;  Surgeon: Erroll Luna, MD;  Location: [facility];  Service: General;  Laterality: Right;  ? PORTACATH PLACEMENT Right 03/20/2015  ? Procedure: INSERTION PORT-A-CATH;  Surgeon: Erroll Luna, MD;  Location: [facility];  Service: General;  Laterality: Right;  ? RADIOACTIVE SEED GUIDED PARTIAL MASTECTOMY WITH AXILLARY SENTINEL LYMPH NODE BIOPSY Left 08/23/2015  ? Procedure: LEFT BREAST PARTIAL MASTECTOMY WITH SENTINEL LYMPH NODE MAPPING;  Surgeon: Erroll Luna, MD;  Location: [facility];  Service: General;  Laterality: Left;  ? TONSILLECTOMY AND ADENOIDECTOMY    ? TOTAL VAGINAL HYSTERECTOMY    ? secondary to prolapse, ovaries not removed  ? ? ?Current Medications: ?Current Meds  ?Medication Sig  ? B Complex Vitamins  (VITAMIN B COMPLEX PO) Take 1 tablet by mouth daily.   ? calcium carbonate (TUMS) 500 MG chewable tablet Chew 2 tablets (400 mg of elemental calcium total) by mouth 3 times/day as needed-between meals & bedtime for indigestion or heartburn.  ? carvedilol (COREG) 25 MG tablet Take 1 tablet (25 mg total) by mouth 2 (two) times daily.  ? furosemide (LASIX) 20 MG tablet Take 1 tablet (20 mg total) by mouth daily.  ? hyoscyamine (LEVSIN) 0.125 MG tablet TAKE 1-2 TABLET EVERY 4-6 HOURS AS NEEDED FOR ABDOMINAL PAIN  ? lidocaine (LIDODERM) 5 % Place 1 patch onto the skin daily. Remove & Discard patch within 12 hours or as directed by MD  ?  lisinopril (ZESTRIL) 40 MG tablet Take 40 mg by mouth at bedtime.  ? loratadine (CLARITIN) 10 MG tablet Take 10 mg by mouth daily.  ? metroNIDAZOLE (METROCREAM) 0.75 % cream Apply 1 application topically daily.   ? spironolactone (ALDACTONE) 25 MG tablet Take 0.5 tablets (12.5 mg total) by mouth daily.  ? triamcinolone cream (KENALOG) 0.1 % SMARTSIG:1 Application Topical 2-3 Times Daily  ?  ? ?Allergies:   Patient has no known allergies.  ? ?Social History  ? ?Socioeconomic History  ? Marital status: Widowed  ?  Spouse name: Not on file  ? Number of children: 4  ? Years of education: Not on file  ? Highest education level: Not on file  ?Occupational History  ? Occupation: retired  ?Tobacco Use  ? Smoking status: Never  ? Smokeless tobacco: Never  ?Vaping Use  ? Vaping Use: Never used  ?Substance and Sexual Activity  ? Alcohol use: Yes  ?  Comment: occasionally  wine/beer  ? Drug use: No  ? Sexual activity: Never  ?  Birth control/protection: Surgical  ?  Comment: Vaginal hysterectomy  ?Other Topics Concern  ? Not on file  ?Social History Narrative  ? Not on file  ? ?Social Determinants of Health  ? ?Financial Resource Strain: Not on file  ?Food Insecurity: Not on file  ?Transportation Needs: Not on file  ?Physical Activity: Not on file  ?Stress: Not on file  ?Social Connections: Not on  file  ?  ? ?Family History: ?The patient's family history includes Alzheimer's disease in her mother; Breast cancer (age of onset: 63) in her sister; Cancer in her maternal uncle; Cancer (age of onset: 56) in

## 2022-02-18 ENCOUNTER — Encounter: Payer: Self-pay | Admitting: Cardiology

## 2022-02-18 ENCOUNTER — Ambulatory Visit (INDEPENDENT_AMBULATORY_CARE_PROVIDER_SITE_OTHER): Payer: Medicare Other | Admitting: Cardiology

## 2022-02-18 DIAGNOSIS — N182 Chronic kidney disease, stage 2 (mild): Secondary | ICD-10-CM

## 2022-02-18 DIAGNOSIS — I10 Essential (primary) hypertension: Secondary | ICD-10-CM | POA: Diagnosis not present

## 2022-02-18 DIAGNOSIS — I5022 Chronic systolic (congestive) heart failure: Secondary | ICD-10-CM

## 2022-02-18 NOTE — Assessment & Plan Note (Signed)
Followed by nephrology.  Excellent. ?

## 2022-02-18 NOTE — Assessment & Plan Note (Addendum)
Previously had been on Adriamycin during breast cancer therapy.  Most recent ejection fraction 45 to 50% improved from original of 20.  Continue with goal-directed medical therapy which includes carvedilol 25 twice a day, Lasix 20 mg a day, lisinopril 40 mg at bedtime, spironolactone 12.5 mg a day.  She does get blood work done fairly often by nephrology, Dr. Marval Regal.  ?

## 2022-02-18 NOTE — Patient Instructions (Signed)

## 2022-02-18 NOTE — Assessment & Plan Note (Signed)
Overall well controlled on average.  She does sometimes have midday blood pressures that are low.  She feels washed out and achy she states.  I told her to feel free with adjusting some of the timing of the medications to see if this helps. ?

## 2022-02-21 DIAGNOSIS — R0689 Other abnormalities of breathing: Secondary | ICD-10-CM | POA: Diagnosis not present

## 2022-02-21 DIAGNOSIS — R1084 Generalized abdominal pain: Secondary | ICD-10-CM | POA: Diagnosis not present

## 2022-02-21 DIAGNOSIS — R42 Dizziness and giddiness: Secondary | ICD-10-CM | POA: Diagnosis not present

## 2022-02-21 DIAGNOSIS — R55 Syncope and collapse: Secondary | ICD-10-CM | POA: Diagnosis not present

## 2022-02-21 DIAGNOSIS — R52 Pain, unspecified: Secondary | ICD-10-CM | POA: Diagnosis not present

## 2022-02-25 DIAGNOSIS — N183 Chronic kidney disease, stage 3 unspecified: Secondary | ICD-10-CM | POA: Diagnosis not present

## 2022-02-25 DIAGNOSIS — I700 Atherosclerosis of aorta: Secondary | ICD-10-CM | POA: Diagnosis not present

## 2022-02-25 DIAGNOSIS — I5022 Chronic systolic (congestive) heart failure: Secondary | ICD-10-CM | POA: Diagnosis not present

## 2022-02-25 DIAGNOSIS — M85859 Other specified disorders of bone density and structure, unspecified thigh: Secondary | ICD-10-CM | POA: Diagnosis not present

## 2022-02-25 DIAGNOSIS — Z0000 Encounter for general adult medical examination without abnormal findings: Secondary | ICD-10-CM | POA: Diagnosis not present

## 2022-02-25 DIAGNOSIS — I129 Hypertensive chronic kidney disease with stage 1 through stage 4 chronic kidney disease, or unspecified chronic kidney disease: Secondary | ICD-10-CM | POA: Diagnosis not present

## 2022-02-25 DIAGNOSIS — J309 Allergic rhinitis, unspecified: Secondary | ICD-10-CM | POA: Diagnosis not present

## 2022-02-25 DIAGNOSIS — Z1331 Encounter for screening for depression: Secondary | ICD-10-CM | POA: Diagnosis not present

## 2022-02-25 DIAGNOSIS — Z23 Encounter for immunization: Secondary | ICD-10-CM | POA: Diagnosis not present

## 2022-02-25 DIAGNOSIS — N2581 Secondary hyperparathyroidism of renal origin: Secondary | ICD-10-CM | POA: Diagnosis not present

## 2022-02-27 ENCOUNTER — Ambulatory Visit (INDEPENDENT_AMBULATORY_CARE_PROVIDER_SITE_OTHER): Payer: Medicare Other | Admitting: Internal Medicine

## 2022-02-27 ENCOUNTER — Encounter: Payer: Self-pay | Admitting: Internal Medicine

## 2022-02-27 ENCOUNTER — Other Ambulatory Visit: Payer: Medicare Other

## 2022-02-27 VITALS — BP 124/56 | HR 64 | Ht 60.0 in | Wt 155.2 lb

## 2022-02-27 DIAGNOSIS — R1084 Generalized abdominal pain: Secondary | ICD-10-CM

## 2022-02-27 DIAGNOSIS — R197 Diarrhea, unspecified: Secondary | ICD-10-CM

## 2022-02-27 MED ORDER — HYOSCYAMINE SULFATE 0.125 MG PO TABS: ORAL_TABLET | ORAL | 3 refills | Status: DC

## 2022-02-27 MED ORDER — DICYCLOMINE HCL 10 MG PO CAPS: 10.0000 mg | ORAL_CAPSULE | Freq: Three times a day (TID) | ORAL | 3 refills | Status: DC

## 2022-02-27 NOTE — Patient Instructions (Signed)
If you are age 85 or older, your body mass index should be between 23-30. Your Body mass index is 30.31 kg/m?Marland Kitchen If this is out of the aforementioned range listed, please consider follow up with your Primary Care Provider. ? ?If you are age 81 or younger, your body mass index should be between 19-25. Your Body mass index is 30.31 kg/m?Marland Kitchen If this is out of the aformentioned range listed, please consider follow up with your Primary Care Provider.  ? ?Your provider has requested that you go to the basement level for lab work before leaving today. Press "B" on the elevator. The lab is located at the first door on the left as you exit the elevator. ? ?We have sent the following medications to your pharmacy for you to pick up at your convenience: Bentyl 10 mg take one tablet 15 minutes before meals. ? ?Start Citrucel powder one table spoon in 8 ounces of water. Increase to 2 table spoon 2 weeks after starting.  ? ?[facility] [place] [facility] providers would like to encourage you to use [facility] to communicate with providers for non-urgent requests or questions.  Due to long hold times on the telephone, sending your provider a message by [facility] may be a faster and more efficient way to get a response.  Please allow 48 business hours for a response.  Please remember that this is for non-urgent requests.  ? ?Due to recent changes in healthcare laws, you may see the results of your imaging and laboratory studies on MyChart before your provider has had a chance to review them.  We understand that in some cases there may be results that are confusing or concerning to you. Not all laboratory results come back in the same time frame and the provider may be waiting for multiple results in order to interpret others.  Please give [place] 48 hours in order for your provider to thoroughly review all the results before contacting the office for clarification of your results.  ? ?It was a pleasure to see you today! ? ?Thank you for trusting me with your  gastrointestinal care!   ? ?Scarlette Shorts , MD ? ?

## 2022-02-27 NOTE — Progress Notes (Signed)
HISTORY OF PRESENT ILLNESS: ? ?Carla Bennett is a 87 y.o. female, widow and native of [place], with past medical history as listed below.  Patient presents today with ongoing complaints of abdominal cramping with loose stools often associated with presyncopal symptoms.  She has had this for years.  Last seen in the office September 2021.  Patient does state that sublingual Levsin helps her cramping.  However other symptoms continue.  Last colonoscopy 2017 revealed diverticulosis and hemorrhoids.  She describes waking in the morning, drinking 8 ounces of water, coffee, and a small meal.  She then takes her medications.  About 90% of the time she will have a bowel movement possibly multiple bowel movements which eventually become watery.  Occasionally associated with presyncopal symptoms.  Problems less problematic in the afternoon and evening.  She has similar symptoms with the need to defecate, but does not.  No nocturnal symptoms.  She does not use sugar-free agents.  Her weight has been stable.  No bleeding. ? ?REVIEW OF SYSTEMS: ? ?All non-GI ROS negative unless otherwise stated in the HPI except for arthritis, sinus and allergy, hearing problems ? ?Past Medical History:  ?Diagnosis Date  ? Allergy   ? Aortic regurgitation   ? Arthritis   ? hands, neck  ? Basal cell carcinoma   ? Benign neoplasm of colon   ? Breast cancer of upper-outer quadrant of left female breast ([place]) 02/23/2015  ? 4'16-left breast cancer-surgery, chemo, radiation-[name] follows every 3 months  ? Cataract   ? beginning stages-monitoring by MD  ? Cholelithiasis   ? resolved with surgery  ? Chronic systolic CHF (congestive heart failure) ([place]) 02/26/2016  ? A.  Echo 02/21/16:  Mild LVH, EF 20-25%, diff HK, Gr 3 DD, mod AI, mod to severe MR, mod LAE, mod redcued RVSF, mild RAE, mod TR, PASP 53 mmHg, small pericardial effusion, mod L pleural effusion  //  B. Echo 6/17:  Mild LVH EF 30-35%, diffuse HK, grade 1 diastolic dysfunction, trivial AI,  MAC, mild LAE, normal RVSF, trivial pericardial effusion    ? Cystocele   ? Diverticulitis   ? Diverticulosis of colon (without mention of hemorrhage)   ? Gout   ? resolved -  ? Hearing loss   ? some hearing loss in right ear- no hearing aids  ? Heart murmur   ? Hematuria   ? negative urology work up  ? History of blood transfusion 1971  ? x 1 with D&C -   ? Hx of abnormal cervical Pap smear   ? LEEP-CIN I, negative margins and ECC  ? Hypercalcemia   ? Hypertension   ? Mitral regurgitation   ? Neuromuscular disorder ([place])   ? 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  ? OA (osteoarthritis) of hip   ? right  ? Other issue of medical certificates   ? uterine myomata  ? Parathyroid abnormality ([place])   ? being followed Dr. Barbette Hair  ? Radiation 10/01/15-11/16/15  ? left breast axillary and supraclavicular reigion 45 gray, left breast 50.4 gray, lumpectomy cavity boost 10 gray  ? Renal disease 3/16  ? Stage 3 kidney disease-seeing Dr Buddy Duty and Dr Marval Regal  ? Uterine prolaps   ? resolved after surgery  ? Vitamin D deficiency   ? ? ?Past Surgical History:  ?Procedure Laterality Date  ? BREAST LUMPECTOMY Left 08/2015  ? CHOLECYSTECTOMY    ? COLONOSCOPY  01/18/2013  ? [name]  ? COLONOSCOPY N/A 06/26/2016  ? Procedure:  COLONOSCOPY;  Surgeon: Irene Shipper, MD;  Location: [facility];  Service: Endoscopy;  Laterality: N/A;  ? CYSTOCELE REPAIR  08/2010  ? Rectocele, vault prolapse  ? DILATION AND CURETTAGE OF UTERUS    ? x2  ? PORT-A-CATH REMOVAL Right 08/23/2015  ? Procedure: REMOVAL PORT-A-CATH;  Surgeon: Erroll Luna, MD;  Location: [place] [facility];  Service: General;  Laterality: Right;  ? PORTACATH PLACEMENT Right 03/20/2015  ? Procedure: INSERTION PORT-A-CATH;  Surgeon: Erroll Luna, MD;  Location: [place];  Service: General;  Laterality: Right;  ? RADIOACTIVE SEED GUIDED PARTIAL MASTECTOMY WITH AXILLARY SENTINEL LYMPH NODE BIOPSY Left 08/23/2015  ? Procedure: LEFT BREAST PARTIAL MASTECTOMY WITH SENTINEL LYMPH NODE  MAPPING;  Surgeon: Erroll Luna, MD;  Location: [place];  Service: General;  Laterality: Left;  ? TONSILLECTOMY AND ADENOIDECTOMY    ? TOTAL VAGINAL HYSTERECTOMY    ? secondary to prolapse, ovaries not removed  ? ? ?Social History ?Carla Bennett  reports that she has never smoked. She has never used smokeless tobacco. She reports current alcohol use. She reports that she does not use drugs. ? ?family history includes Breast cancer (age of onset: 10) in her sister; Cancer (age of onset: 6) in her cousin and maternal uncle; Dementia in her mother; Lung cancer (age of onset: 75) in her father; Multiple sclerosis in her sister; Parkinson's disease in her mother; Prostate cancer (age of onset: 4) in her maternal uncle; Stomach cancer in her maternal uncle. ? ?No Known Allergies ? ?  ? ?PHYSICAL EXAMINATION: ?Vital signs: BP (!) 124/56   Pulse 64   Ht 5' (1.524 m)   Wt 155 lb 3.2 oz (70.4 kg)   LMP 10/20/1996   BMI 30.31 kg/m?   ?Constitutional: generally well-appearing, no acute distress ?Psychiatric: alert and oriented x3, cooperative ?Eyes: extraocular movements intact, anicteric, conjunctiva pink ?Mouth: oral pharynx moist, no lesions ?Neck: supple no lymphadenopathy ?Cardiovascular: heart regular rate and rhythm, no murmur ?Lungs: clear to auscultation bilaterally ?Abdomen: soft, nontender, nondistended, no obvious ascites, no peritoneal signs, normal bowel sounds, no organomegaly ?Rectal: Omitted ?Extremities: no clubbing, cyanosis, or lower extremity edema bilaterally ?Skin: no lesions on visible extremities ?Neuro: No focal deficits.  Cranial nerves intact ? ?ASSESSMENT: ? ?1.  Abdominal cramping with loose stools as described.  Consistent with irritable bowel. ?2.  Colonoscopy 2017 with diverticulosis ?3.  General medical problems ? ? ?PLAN: ? ?1.  Recommend Citrucel 1-2 tablespoons daily ?2.  Tissue transglutaminase antibody IgA and serum IgA level to screen for celiac disease ?3.   Refill Levsin sublingual. ?4.  Prescribe Bentyl 10 mg.  Take 150 minutes prior to meals.  Medication risk reviewed ?5.  Routine office follow-up in 2 months.  Consider other agents such as Xifaxan, IBgard ?A total time of 30 minutes was spent preparing to see the patient, reviewing studies, obtaining comprehensive history, performing medically appropriate physical exam, counseling and educating the patient regarding the above listed issues, ordering multiple medications, establishing follow-up interval, and documenting clinical information in the health record ? ? ? ?  ?

## 2022-02-28 LAB — IGA: Immunoglobulin A: 199 mg/dL (ref 70–320)

## 2022-02-28 LAB — TISSUE TRANSGLUTAMINASE, IGA: (tTG) Ab, IgA: <1 U/mL

## 2022-03-06 DIAGNOSIS — N183 Chronic kidney disease, stage 3 unspecified: Secondary | ICD-10-CM | POA: Diagnosis not present

## 2022-03-06 DIAGNOSIS — N1831 Chronic kidney disease, stage 3a: Secondary | ICD-10-CM | POA: Diagnosis not present

## 2022-03-06 DIAGNOSIS — N2581 Secondary hyperparathyroidism of renal origin: Secondary | ICD-10-CM | POA: Diagnosis not present

## 2022-03-06 DIAGNOSIS — I5020 Unspecified systolic (congestive) heart failure: Secondary | ICD-10-CM | POA: Diagnosis not present

## 2022-03-06 DIAGNOSIS — I129 Hypertensive chronic kidney disease with stage 1 through stage 4 chronic kidney disease, or unspecified chronic kidney disease: Secondary | ICD-10-CM | POA: Diagnosis not present

## 2022-03-12 ENCOUNTER — Encounter: Payer: Self-pay | Admitting: Internal Medicine

## 2022-04-17 ENCOUNTER — Telehealth: Payer: Self-pay | Admitting: Internal Medicine

## 2022-04-17 NOTE — Telephone Encounter (Signed)
The same measures that you recommended.  Can also add Citrucel 1 tablespoon daily.  Thanks

## 2022-04-17 NOTE — Telephone Encounter (Signed)
Spoke with pt and she is going to try the recommendations for the hemorrhoids. She is taking citrucel 2 tbsp at night. Discussed with her she could try 1 tbsp in the am and 1 tbsp in the pm and see if that helps. She knows to call back if she continues to have problems.

## 2022-04-17 NOTE — Telephone Encounter (Signed)
Pt reports when she first started taking the bentyl it was working great. Now she states that she is back to having multiple formed BM's in the am, 6-7. This has irritated her hemorrhoids and they are bleeding. Discussed with pt that she can do sitz baths a couple times/day, use wet wipes, and try prep h supp and creams for the hemorrhoids. She is not having multiple stools after lunch or dinner. She states she is afraid to eat due to the multiple stools in the am. She is taking the bentyl in the am after she eats a cracker because she is afraid to take it on an empty stomach. Pt wants to know what Dr. Henrene Pastor recommends.  Please advise.

## 2022-04-17 NOTE — Telephone Encounter (Signed)
Patient called requesting to speak with a nurse regarding hemorrhoids\rectal bleeding and is scared to eat anything.

## 2022-04-29 ENCOUNTER — Encounter: Payer: Self-pay | Admitting: Internal Medicine

## 2022-04-29 ENCOUNTER — Ambulatory Visit (INDEPENDENT_AMBULATORY_CARE_PROVIDER_SITE_OTHER): Payer: Medicare Other | Admitting: Internal Medicine

## 2022-04-29 VITALS — BP 118/60 | HR 76 | Ht 59.75 in | Wt 154.0 lb

## 2022-04-29 DIAGNOSIS — R1084 Generalized abdominal pain: Secondary | ICD-10-CM | POA: Diagnosis not present

## 2022-04-29 DIAGNOSIS — K580 Irritable bowel syndrome with diarrhea: Secondary | ICD-10-CM

## 2022-04-29 NOTE — Patient Instructions (Signed)
If you are age 85 or older, your body mass index should be between 23-30. Your Body mass index is 30.33 kg/m. If this is out of the aforementioned range listed, please consider follow up with your Primary Care Provider.  If you are age 2 or younger, your body mass index should be between 19-25. Your Body mass index is 30.33 kg/m. If this is out of the aformentioned range listed, please consider follow up with your Primary Care Provider.   ________________________________________________________  The [facility] providers would like to encourage you to use [facility] to communicate with providers for non-urgent requests or questions.  Due to long hold times on the telephone, sending your provider a message by [facility] may be a faster and more efficient way to get a response.  Please allow 48 business hours for a response.  Please remember that this is for non-urgent requests.  _______________________________________________________  Please follow up in 6 months

## 2022-04-29 NOTE — Progress Notes (Signed)
HISTORY OF PRESENT ILLNESS:  Carla Bennett is a 87 y.o. female, widow and native of [place], who was evaluated in the office the 11th 2023 regarding abdominal cramping with loose stools this.  See that dictation.  She was found to have irritable bowel syndrome.  Screening for celiac disease was negative.  She was prescribed Levsin sublingual and told to take Citrucel 1 to 2 tablespoons daily.  She was also prescribed Bentyl to take 15 minutes prior to meals.  Follow-up scheduled at this time.  She is pleased to report that she is markedly improved.  She will notice that if she does not take Bentyl for meals, she occasionally have cramping for which she takes Levsin.  This helps.  She is taking 2 tablespoons of Citrucel daily (taking 1 tablespoon with 8 ounces of water twice daily).  She takes a statin drug twice weekly.  She tells me that she has some weakness in her legs today after taking a statin drug.  GI perspective she is pleased with no new complaints.  She does have a number of questions.  REVIEW OF SYSTEMS:  All non-GI ROS negative unless otherwise stated in the HPI  Past Medical History:  Diagnosis Date   Allergy    Aortic regurgitation    Arthritis    hands, neck   Basal cell carcinoma    Benign neoplasm of colon    Breast cancer of upper-outer quadrant of left female breast ([place]) 02/23/2015   4'16-left breast cancer-surgery, chemo, radiation-[name] follows every 3 months   Cataract    beginning stages-monitoring by MD   Cholelithiasis    resolved with surgery   Chronic systolic CHF (congestive heart failure) ([place]) 02/26/2016   A.  Echo 02/21/16:  Mild LVH, EF 20-25%, diff HK, Gr 3 DD, mod AI, mod to severe MR, mod LAE, mod redcued RVSF, mild RAE, mod TR, PASP 53 mmHg, small pericardial effusion, mod L pleural effusion  //  B. Echo 6/17:  Mild LVH EF 30-35%, diffuse HK, grade 1 diastolic dysfunction, trivial AI, MAC, mild LAE, normal RVSF, trivial pericardial effusion      Cystocele    Diverticulitis    Diverticulosis of colon (without mention of hemorrhage)    Gout    resolved -   Hearing loss    some hearing loss in right ear- no hearing aids   Heart murmur    Hematuria    negative urology work up   History of blood transfusion 1971   x 1 with D&C -    Hx of abnormal cervical Pap smear    LEEP-CIN I, negative margins and ECC   Hypercalcemia    Hypertension    Mitral regurgitation    Neuromuscular disorder ([facility])    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   OA (osteoarthritis) of hip    right   Other issue of medical certificates    uterine myomata   Parathyroid abnormality ([place])    being followed Dr. Barbette Hair   Radiation 10/01/15-11/16/15   left breast axillary and supraclavicular reigion 45 gray, left breast 50.4 gray, lumpectomy cavity boost 10 gray   Renal disease 3/16   Stage 3 kidney disease-seeing Dr Buddy Duty and Dr Marval Regal   Uterine prolaps    resolved after surgery   Vitamin D deficiency     Past Surgical History:  Procedure Laterality Date   BREAST LUMPECTOMY Left 08/2015   CHOLECYSTECTOMY     COLONOSCOPY  01/18/2013   Olevia Perches  COLONOSCOPY N/A 06/26/2016   Procedure: COLONOSCOPY;  Surgeon: Irene Shipper, MD;  Location: [facility];  Service: Endoscopy;  Laterality: N/A;   CYSTOCELE REPAIR  08/2010   Rectocele, vault prolapse   DILATION AND CURETTAGE OF UTERUS     x2   PORT-A-CATH REMOVAL Right 08/23/2015   Procedure: REMOVAL PORT-A-CATH;  Surgeon: Erroll Luna, MD;  Location: [place];  Service: General;  Laterality: Right;   PORTACATH PLACEMENT Right 03/20/2015   Procedure: INSERTION PORT-A-CATH;  Surgeon: Erroll Luna, MD;  Location: [place];  Service: General;  Laterality: Right;   RADIOACTIVE SEED GUIDED PARTIAL MASTECTOMY WITH AXILLARY SENTINEL LYMPH NODE BIOPSY Left 08/23/2015   Procedure: LEFT BREAST PARTIAL MASTECTOMY WITH SENTINEL LYMPH NODE [place];  Surgeon: Erroll Luna, MD;  Location: [place];  Service: General;  Laterality: Left;   TONSILLECTOMY AND ADENOIDECTOMY     TOTAL VAGINAL HYSTERECTOMY     secondary to prolapse, ovaries not removed    Social History Ameyah L Laforest  reports that she has never smoked. She has never used smokeless tobacco. She reports current alcohol use. She reports that she does not use drugs.  family history includes Breast cancer (age of onset: 37) in her sister; Cancer (age of onset: 46) in her cousin and maternal uncle; Dementia in her mother; Lung cancer (age of onset: 54) in her father; Multiple sclerosis in her sister; Parkinson's disease in her mother; Prostate cancer (age of onset: 104) in her maternal uncle; Stomach cancer in her maternal uncle.  No Known Allergies     PHYSICAL EXAMINATION: Vital signs: Ht 4' 11.75" (1.518 m) Comment: height measured without shoes  Wt 154 lb (69.9 kg)   LMP 10/20/1996   BMI 30.33 kg/m   Constitutional: generally well-appearing, no acute distress Psychiatric: alert and oriented x3, cooperative Eyes: Anicteric Abdomen: Not reexamined Extremities: no abnormalities on visible visible extremities Skin: no lesions on visible extremities Neuro: Grossly intact  ASSESSMENT:  1.  Diarrhea predominant irritable bowel syndrome.  Improved on a combination of fiber and antispasmodics.   PLAN:  1.  Continue Bentyl, Levsin, and Citrucel.  Refilled as needed.  Medication risks reviewed 2.  Multiple questions answered 3.  Office follow-up 6 months.  Contact the office in the interim for any questions or problems

## 2022-05-09 DIAGNOSIS — M2550 Pain in unspecified joint: Secondary | ICD-10-CM | POA: Diagnosis not present

## 2022-05-09 DIAGNOSIS — M7070 Other bursitis of hip, unspecified hip: Secondary | ICD-10-CM | POA: Diagnosis not present

## 2022-05-09 DIAGNOSIS — M25511 Pain in right shoulder: Secondary | ICD-10-CM | POA: Diagnosis not present

## 2022-05-19 DIAGNOSIS — N1832 Chronic kidney disease, stage 3b: Secondary | ICD-10-CM | POA: Diagnosis not present

## 2022-05-19 DIAGNOSIS — M85851 Other specified disorders of bone density and structure, right thigh: Secondary | ICD-10-CM | POA: Diagnosis not present

## 2022-05-19 DIAGNOSIS — E782 Mixed hyperlipidemia: Secondary | ICD-10-CM | POA: Diagnosis not present

## 2022-05-19 DIAGNOSIS — I5022 Chronic systolic (congestive) heart failure: Secondary | ICD-10-CM | POA: Diagnosis not present

## 2022-05-19 DIAGNOSIS — Z780 Asymptomatic menopausal state: Secondary | ICD-10-CM | POA: Diagnosis not present

## 2022-05-19 DIAGNOSIS — Z1231 Encounter for screening mammogram for malignant neoplasm of breast: Secondary | ICD-10-CM | POA: Diagnosis not present

## 2022-05-19 DIAGNOSIS — M85852 Other specified disorders of bone density and structure, left thigh: Secondary | ICD-10-CM | POA: Diagnosis not present

## 2022-05-19 DIAGNOSIS — I129 Hypertensive chronic kidney disease with stage 1 through stage 4 chronic kidney disease, or unspecified chronic kidney disease: Secondary | ICD-10-CM | POA: Diagnosis not present

## 2022-05-19 LAB — HM MAMMOGRAPHY

## 2022-05-20 ENCOUNTER — Telehealth: Payer: Self-pay | Admitting: *Deleted

## 2022-05-20 NOTE — Telephone Encounter (Signed)
Per MD request RN placed call to pt to review recent bone density report from [facility] showing T score -1.2.  Pt states she is unable to take vitamin D and Calcium supplements due to hyperparathyroidism and has been advised against supplements from such supplements.  Pt educated on the importance of daily weight bearing exercises.  Pt verbalized understanding.

## 2022-05-21 DIAGNOSIS — I129 Hypertensive chronic kidney disease with stage 1 through stage 4 chronic kidney disease, or unspecified chronic kidney disease: Secondary | ICD-10-CM | POA: Diagnosis not present

## 2022-05-21 DIAGNOSIS — I5022 Chronic systolic (congestive) heart failure: Secondary | ICD-10-CM | POA: Diagnosis not present

## 2022-05-21 DIAGNOSIS — M159 Polyosteoarthritis, unspecified: Secondary | ICD-10-CM | POA: Diagnosis not present

## 2022-05-21 DIAGNOSIS — E782 Mixed hyperlipidemia: Secondary | ICD-10-CM | POA: Diagnosis not present

## 2022-05-27 DIAGNOSIS — M25511 Pain in right shoulder: Secondary | ICD-10-CM | POA: Diagnosis not present

## 2022-05-27 DIAGNOSIS — M85859 Other specified disorders of bone density and structure, unspecified thigh: Secondary | ICD-10-CM | POA: Diagnosis not present

## 2022-06-11 ENCOUNTER — Ambulatory Visit: Payer: Medicare Other | Attending: Physician Assistant | Admitting: Physical Therapy

## 2022-06-11 DIAGNOSIS — R293 Abnormal posture: Secondary | ICD-10-CM | POA: Insufficient documentation

## 2022-06-11 DIAGNOSIS — M6281 Muscle weakness (generalized): Secondary | ICD-10-CM | POA: Diagnosis not present

## 2022-06-11 DIAGNOSIS — M25512 Pain in left shoulder: Secondary | ICD-10-CM | POA: Diagnosis not present

## 2022-06-11 DIAGNOSIS — M25511 Pain in right shoulder: Secondary | ICD-10-CM | POA: Diagnosis not present

## 2022-06-11 NOTE — Therapy (Addendum)
OUTPATIENT PHYSICAL THERAPY SHOULDER EVALUATION   Patient Name: Carla Bennett MRN: [id number] DOB:19-Aug-1935, 87 y.o., female Today's Date: 06/11/2022   PT End of Session - 06/11/22 1451     Visit Number 1    Number of Visits 12    Date for PT Re-Evaluation 07/23/22    Authorization Type Medicare + [employer] supplement    Progress Note Due on Visit 10    PT Start Time 1315    PT Stop Time 1410    PT Time Calculation (min) 55 min    Activity Tolerance Patient tolerated treatment well    Behavior During Therapy WFL for tasks assessed/performed             Past Medical History:  Diagnosis Date   Allergy    Aortic regurgitation    Arthritis    hands, neck   Basal cell carcinoma    Benign neoplasm of colon    Breast cancer of upper-outer quadrant of left female breast ([place]) 02/23/2015   4'16-left breast cancer-surgery, chemo, radiation-[name] follows every 3 months   Cataract    beginning stages-monitoring by MD   Cholelithiasis    resolved with surgery   Chronic systolic CHF (congestive heart failure) ([place]) 02/26/2016   A.  Echo 02/21/16:  Mild LVH, EF 20-25%, diff HK, Gr 3 DD, mod AI, mod to severe MR, mod LAE, mod redcued RVSF, mild RAE, mod TR, PASP 53 mmHg, small pericardial effusion, mod L pleural effusion  //  B. Echo 6/17:  Mild LVH EF 30-35%, diffuse HK, grade 1 diastolic dysfunction, trivial AI, MAC, mild LAE, normal RVSF, trivial pericardial effusion     Cystocele    Diverticulitis    Diverticulosis of colon (without mention of hemorrhage)    Gout    resolved -   Hearing loss    some hearing loss in right ear- no hearing aids   Heart murmur    Hematuria    negative urology work up   History of blood transfusion 1971   x 1 with D&C -    Hx of abnormal cervical Pap smear    LEEP-CIN I, negative margins and ECC   Hypercalcemia    Hypertension    Mitral regurgitation    Neuromuscular disorder (HCC)    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   OA  (osteoarthritis) of hip    right   Other issue of medical certificates    uterine myomata   Parathyroid abnormality ([place])    being followed Dr. Barbette Hair   Radiation 10/01/15-11/16/15   left breast axillary and supraclavicular reigion 45 gray, left breast 50.4 gray, lumpectomy cavity boost 10 gray   Renal disease 3/16   Stage 3 kidney disease-seeing Dr Buddy Duty and Dr Marval Regal   Uterine prolaps    resolved after surgery   Vitamin D deficiency    Past Surgical History:  Procedure Laterality Date   BREAST LUMPECTOMY Left 08/2015   CHOLECYSTECTOMY     COLONOSCOPY  01/18/2013   [name]   COLONOSCOPY N/A 06/26/2016   Procedure: COLONOSCOPY;  Surgeon: Irene Shipper, MD;  Location: [facility];  Service: Endoscopy;  Laterality: N/A;   CYSTOCELE REPAIR  08/2010   Rectocele, vault prolapse   DILATION AND CURETTAGE OF UTERUS     x2   PORT-A-CATH REMOVAL Right 08/23/2015   Procedure: REMOVAL PORT-A-CATH;  Surgeon: Erroll Luna, MD;  Location: [place];  Service: General;  Laterality: Right;   PORTACATH PLACEMENT Right  03/20/2015   Procedure: INSERTION PORT-A-CATH;  Surgeon: Erroll Luna, MD;  Location: [place];  Service: General;  Laterality: Right;   RADIOACTIVE SEED GUIDED PARTIAL MASTECTOMY WITH AXILLARY SENTINEL LYMPH NODE BIOPSY Left 08/23/2015   Procedure: LEFT BREAST PARTIAL MASTECTOMY WITH SENTINEL LYMPH NODE MAPPING;  Surgeon: Erroll Luna, MD;  Location: [place];  Service: General;  Laterality: Left;   TONSILLECTOMY AND ADENOIDECTOMY     TOTAL VAGINAL HYSTERECTOMY     secondary to prolapse, ovaries not removed   Patient Active Problem List   Diagnosis Date Noted   Mixed hyperlipidemia 08/21/2021   Diverticulosis of colon without hemorrhage    Chronic systolic CHF (congestive heart failure) ([place]) 02/26/2016   Dilated cardiomyopathy ([place]) 02/26/2016   CKD (chronic kidney disease) 02/26/2016   Essential hypertension 07/26/2015   Family history of  malignant neoplasm of gastrointestinal tract 03/08/2015   Breast cancer of upper-outer quadrant of left female breast ([place]) 02/23/2015   Hematochezia 01/06/2013    PCP: Donald Prose, MD  REFERRING PROVIDER: Olen Cordial, PA  REFERRING DIAG: M25.511 (ICD-10-CM) -Bilateral shoulder pain  THERAPY DIAG:  Acute pain of left shoulder  Acute pain of right shoulder  Abnormal posture  Muscle weakness (generalized)  Rationale for Evaluation and Treatment Rehabilitation  ONSET DATE: 5 weeks ago  SUBJECTIVE:                                                                                                                                                                                      SUBJECTIVE STATEMENT: Patient reported pain in both shoulders started about 5 weeks ago, at the time the weather was very rainy and thought that caused it, but then the weather improved and pain stayed.  Pain is especially bad in the morning, also noted reading hardcover and ipad makes symptoms worse, hands start to feel week.  Also getting difficulty to reach overhead to second shelf.  PERTINENT HISTORY: PMH: basal cell carcinoma, breast cancer, CHF, diverticulitis, hearing loss, neuropathy of feet due to chemotherapy, R hip OA, HTN, bil hand OA  PAIN:  Are you having pain? Yes: NPRS scale: 6/10 Pain location: L shoulder  Pain description: ache, sharp, shooting, weakness in hand  Aggravating factors: horrible in the morning, difficulty getting out of bed Relieving factors: tylenol and pain creme  Are you having pain? Yes: NPRS scale: 6/10 Pain location: R shoulder Pain description: aching, R hand gets numb and tingling, sharp/shooting especially in morning.  Aggravating factors: reaching overhead, increased pain in the morning Relieving factors: tylenol and pain creme  PRECAUTIONS: Other: history of L sided breast cancer, keep resistance low.    WEIGHT  BEARING RESTRICTIONS No  FALLS:  Has  patient fallen in last 6 months? No  LIVING ENVIRONMENT: Lives with: lives alone Lives in: House/apartment Stairs: No Has following equipment at home: Grab bars  OCCUPATION: Retired   PLOF: Independent  PATIENT GOALS "Be able to clean house like I used too pain free"  OBJECTIVE:   DIAGNOSTIC FINDINGS:  Cervical MRI 12/18/2020 IMPRESSION: 1. No acute fracture or acute intracranial findings. 2. Right facial soft tissue swelling overlying the maxilla. Probable soft tissue swelling along the chin eccentric to the right. 3. Periventricular white matter and corona radiata hypodensities favor chronic ischemic microvascular white matter disease. 4. Cervical spondylosis and degenerative disc disease causing multilevel osseous foraminal impingement. 5. Left common carotid atherosclerotic calcification. 6. Chronic paranasal sinusitis. Acute on chronic bilateral maxillary sinusitis.  PATIENT SURVEYS:  Quick Dash 56.8% disability   COGNITION:  Overall cognitive status: Within functional limits for tasks assessed     SENSATION: Slightly diminished L thumb  POSTURE: Forward head posture, rounded shoulder  CERVICAL ROM:  AROM eval  Cervical Flexion 40  Cervical Extension 45  Cervical Rotation to Left 70  Cervical Rotation to Right 70  Left Sidebend 40  Right Sidebend 30  No pain just tightness with movement.   UPPER EXTREMITY ROM:   full PROM bil,  AROM limited by pain.   Active ROM Right*  eval Left eval  Shoulder flexion 120 125*  Shoulder extension 75 75  Shoulder abduction 120 125  Shoulder internal rotation  Functional T12 Functional T12  Shoulder external rotation Functional T2 Functional T2  Elbow flexion    Elbow extension    Wrist flexion    Wrist extension    (Blank rows = not tested)  UPPER EXTREMITY MMT:  MMT Right eval Left eval  Shoulder flexion 4* 4*  Shoulder extension    Shoulder abduction 4* 4*  Shoulder adduction    Shoulder internal  rotation 5 5  Shoulder external rotation 5 5  Elbow flexion 5 5  Elbow extension 5 5  Wrist flexion    Wrist extension 5 5  Wrist ulnar deviation    Wrist radial deviation    Wrist pronation    Wrist supination    Grip strength  good good  (Blank rows = not tested)  *pain  SHOULDER SPECIAL TESTS:  Painful arc bil  Negative spurling  JOINT MOBILITY TESTING:  Full PROM, no capsular tightness  PALPATION:  No pain with palpation bil shoulders.  Noted increased cervical lordosis and hypomobility, tightness anterior scalenes, levator, upper trap, and cervical paraspinals.     TODAY'S TREATMENT:  Self Care: See patient education.  Education on posture, recommendations to avoid prolonged forward head posture with reading/ipad - try cookbook stand to prop up to improve posture.    Therapeutic Exercise: to improve strength and mobility.  Demo, verbal and tactile cues throughout for technique. - Seated Cervical Retraction  - 10 reps - Seated Scapular Retraction - 10 reps - Seated Shoulder Rolls  -RETRO - 10 reps  PATIENT EDUCATION: Education details: education on findings, POC, posture, initial HEP Person educated: Patient Education method: Consulting civil engineer, Demonstration, Verbal cues, and Handouts Education comprehension: verbalized understanding and returned demonstration   HOME EXERCISE PROGRAM: Access Code: [email or web address]  ASSESSMENT:  CLINICAL IMPRESSION: Patient is a 87 y.o. right hand dominant female who was seen today for physical therapy evaluation and treatment for bilateral shoulder pain.  She demonstrates increased pain in both shoulders with overhead movements, and reports numbness  and tingling radiating to hands bil especially with prolonged reading.  Pain is also more severe in mornings.  She demonstrated full PROM in bil shoulders, and decreased shoulder strength limited by pain, but no pain in shoulders with palpation.  She demonstrates forward head posture and likely arm pain  is due to cervical radiculopathy and impaired posture.  Instructed in initial HEP today for postural strengthening, she reported no pain with exercises and decreased pain.  Zyan L Bry would benefit from skilled therapy to decrease shoulder pain, improve posture, and improve functional ability.      OBJECTIVE IMPAIRMENTS decreased activity tolerance, decreased ROM, decreased strength, increased fascial restrictions, impaired perceived functional ability, increased muscle spasms, impaired sensation, impaired UE functional use, postural dysfunction, and pain.   ACTIVITY LIMITATIONS carrying, lifting, bending, sleeping, bed mobility, dressing, reach over head, and hygiene/grooming  PARTICIPATION LIMITATIONS: cleaning and laundry  PERSONAL FACTORS Age and 3+ comorbidities: h/o breast cancer, CHF, diverticulitis, hearing loss, neuropathy of feet due to chemotherapy, R hip OA, HTN, bil hand OA  are also affecting patient's functional outcome.   REHAB POTENTIAL: Good  CLINICAL DECISION MAKING: Evolving/moderate complexity  EVALUATION COMPLEXITY: Moderate   GOALS: Goals reviewed with patient? Yes  SHORT TERM GOALS: Target date: 06/25/2022   Patient will be independent with initial HEP.  Baseline: given Goal status: INITIAL   LONG TERM GOALS: Target date: 07/23/2022   Patient will be independent with advanced/ongoing HEP to improve outcomes and carryover.  Baseline: needs progression Goal status: INITIAL  2.  Patient will report 75% improvement in bil shoulder pain to improve QOL.  Baseline: 6-9/10 Goal status: INITIAL  3.  Patient to demonstrate improved upright posture with posterior shoulder girdle engaged to promote improved glenohumeral joint mobility. Baseline: forward head posture Goal status: INITIAL  4.  Patient to improve bil shoulder AROM to [facility] without pain provocation to allow for increased ease of ADLs.  Baseline: see objective,  AROM limited by pain Goal status:  INITIAL  5.  Patient will demonstrate improved functional UE strength as demonstrated by 5/5 bil shoulder strength. Baseline: 4/5 bil limited by pain Goal status: INITIAL  6  Patient will report <40% impairment on quickDash  to demonstrate improved functional ability.  Baseline: 56.8% impairment.  Goal status: INITIAL  7.  Patient will be able to read without numbness/tingling in hands.    Baseline: increases, difficulty holding hardback books Goal status: INITIAL   8. Patient will report decreased bil shoulder pain in mornings to be able to use arms to get out of bed.  Baseline: has to swing legs out, cant use arms Goal status: INITIAL    PLAN: PT FREQUENCY: 1-2x/week  PT DURATION: 6 weeks  PLANNED INTERVENTIONS: Therapeutic exercises, Therapeutic activity, Neuromuscular re-education, Balance training, Gait training, Patient/Family education, Self Care, Joint mobilization, Dry Needling, Electrical stimulation, Spinal mobilization, Cryotherapy, Moist heat, Traction, Ultrasound, Ionotophoresis 80m/ml Dexamethasone, Manual therapy, and Re-evaluation  PLAN FOR NEXT SESSION: review and progress HEP for postural strengthening, manual therapy to decrease tightness cervical spine, modalities PRN, may trial traction   ERennie Natter PT, DPT  06/11/2022, 2:52 PM

## 2022-06-16 ENCOUNTER — Ambulatory Visit: Payer: Medicare Other

## 2022-06-16 DIAGNOSIS — M25512 Pain in left shoulder: Secondary | ICD-10-CM

## 2022-06-16 DIAGNOSIS — M6281 Muscle weakness (generalized): Secondary | ICD-10-CM | POA: Diagnosis not present

## 2022-06-16 DIAGNOSIS — R293 Abnormal posture: Secondary | ICD-10-CM

## 2022-06-16 DIAGNOSIS — M25511 Pain in right shoulder: Secondary | ICD-10-CM

## 2022-06-16 NOTE — Therapy (Signed)
OUTPATIENT PHYSICAL THERAPY TREATMENT   Patient Name: Carla Bennett MRN: [id number] DOB:[birth year], 87 y.o., female Today's Date: 06/16/2022   PT End of Session - 06/16/22 1707     Visit Number 2    Number of Visits 12    Date for PT Re-Evaluation 07/23/22    Authorization Type Medicare + [employer] supplement    Progress Note Due on Visit 10    PT Start Time 1619    PT Stop Time 1700    PT Time Calculation (min) 41 min    Activity Tolerance Patient tolerated treatment well    Behavior During Therapy WFL for tasks assessed/performed              Past Medical History:  Diagnosis Date   Allergy    Aortic regurgitation    Arthritis    hands, neck   Basal cell carcinoma    Benign neoplasm of colon    Breast cancer of upper-outer quadrant of left female breast ([place]) 02/23/2015   4'16-left breast cancer-surgery, chemo, radiation-[name] follows every 3 months   Cataract    beginning stages-monitoring by MD   Cholelithiasis    resolved with surgery   Chronic systolic CHF (congestive heart failure) ([place]) 02/26/2016   A.  Echo 02/21/16:  Mild LVH, EF 20-25%, diff HK, Gr 3 DD, mod AI, mod to severe MR, mod LAE, mod redcued RVSF, mild RAE, mod TR, PASP 53 mmHg, small pericardial effusion, mod L pleural effusion  //  B. Echo 6/17:  Mild LVH EF 30-35%, diffuse HK, grade 1 diastolic dysfunction, trivial AI, MAC, mild LAE, normal RVSF, trivial pericardial effusion     Cystocele    Diverticulitis    Diverticulosis of colon (without mention of hemorrhage)    Gout    resolved -   Hearing loss    some hearing loss in right ear- no hearing aids   Heart murmur    Hematuria    negative urology work up   History of blood transfusion 1971   x 1 with D&C -    Hx of abnormal cervical Pap smear    LEEP-CIN I, negative margins and ECC   Hypercalcemia    Hypertension    Mitral regurgitation    Neuromuscular disorder (HCC)    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   OA (osteoarthritis) of hip     right   Other issue of medical certificates    uterine myomata   Parathyroid abnormality ([place])    being followed Dr. Barbette Hair   Radiation 10/01/15-11/16/15   left breast axillary and supraclavicular reigion 45 gray, left breast 50.4 gray, lumpectomy cavity boost 10 gray   Renal disease 3/16   Stage 3 kidney disease-seeing Dr Buddy Duty and Dr Marval Regal   Uterine prolaps    resolved after surgery   Vitamin D deficiency    Past Surgical History:  Procedure Laterality Date   BREAST LUMPECTOMY Left 08/2015   CHOLECYSTECTOMY     COLONOSCOPY  01/18/2013   [name]   COLONOSCOPY N/A 06/26/2016   Procedure: COLONOSCOPY;  Surgeon: Irene Shipper, MD;  Location: [facility];  Service: Endoscopy;  Laterality: N/A;   CYSTOCELE REPAIR  08/2010   Rectocele, vault prolapse   DILATION AND CURETTAGE OF UTERUS     x2   PORT-A-CATH REMOVAL Right 08/23/2015   Procedure: REMOVAL PORT-A-CATH;  Surgeon: Erroll Luna, MD;  Location: [facility];  Service: General;  Laterality: Right;   PORTACATH PLACEMENT Right  03/20/2015   Procedure: INSERTION PORT-A-CATH;  Surgeon: Erroll Luna, MD;  Location: [facility];  Service: General;  Laterality: Right;   RADIOACTIVE SEED GUIDED PARTIAL MASTECTOMY WITH AXILLARY SENTINEL LYMPH NODE BIOPSY Left 08/23/2015   Procedure: LEFT BREAST PARTIAL MASTECTOMY WITH SENTINEL LYMPH NODE MAPPING;  Surgeon: Erroll Luna, MD;  Location: [facility];  Service: General;  Laterality: Left;   TONSILLECTOMY AND ADENOIDECTOMY     TOTAL VAGINAL HYSTERECTOMY     secondary to prolapse, ovaries not removed   Patient Active Problem List   Diagnosis Date Noted   Mixed hyperlipidemia 08/21/2021   Diverticulosis of colon without hemorrhage    Chronic systolic CHF (congestive heart failure) ([place]) 02/26/2016   Dilated cardiomyopathy ([place]) 02/26/2016   CKD (chronic kidney disease) 02/26/2016   Essential hypertension 07/26/2015   Family history of malignant neoplasm of  gastrointestinal tract 03/08/2015   Breast cancer of upper-outer quadrant of left female breast ([place]) 02/23/2015   Hematochezia 01/06/2013    PCP: Donald Prose, MD  REFERRING PROVIDER: Olen Cordial, PA  REFERRING DIAG: M25.511 (ICD-10-CM) -Bilateral shoulder pain  THERAPY DIAG:  Acute pain of left shoulder  Acute pain of right shoulder  Abnormal posture  Muscle weakness (generalized)  Rationale for Evaluation and Treatment Rehabilitation  ONSET DATE: 5 weeks ago  SUBJECTIVE:                                                                                                                                                                                      SUBJECTIVE STATEMENT: Pt reports that pain is always worse in morning throughout the day it gets better. R hand also stays numb.  PERTINENT HISTORY: PMH: basal cell carcinoma, breast cancer, CHF, diverticulitis, hearing loss, neuropathy of feet due to chemotherapy, R hip OA, HTN, bil hand OA  PAIN:  Are you having pain? Yes: NPRS scale: 5/10 Pain location: L shoulder  Pain description: ache, sharp, shooting, weakness in hand  Aggravating factors: horrible in the morning, difficulty getting out of bed Relieving factors: tylenol and pain creme  Are you having pain? Yes: NPRS scale: 6/10 Pain location: R shoulder Pain description: aching, R hand gets numb and tingling, sharp/shooting especially in morning.  Aggravating factors: reaching overhead, increased pain in the morning Relieving factors: tylenol and pain creme  PRECAUTIONS: Other: history of L sided breast cancer, keep resistance low.    WEIGHT BEARING RESTRICTIONS No  FALLS:  Has patient fallen in last 6 months? No  LIVING ENVIRONMENT: Lives with: lives alone Lives in: House/apartment Stairs: No Has following equipment at home: Grab bars  OCCUPATION: Retired   PLOF: Independent  PATIENT GOALS "Be  able to clean house like I used too pain  free"  OBJECTIVE:   DIAGNOSTIC FINDINGS:  Cervical MRI 12/18/2020 IMPRESSION: 1. No acute fracture or acute intracranial findings. 2. Right facial soft tissue swelling overlying the maxilla. Probable soft tissue swelling along the chin eccentric to the right. 3. Periventricular white matter and corona radiata hypodensities favor chronic ischemic microvascular white matter disease. 4. Cervical spondylosis and degenerative disc disease causing multilevel osseous foraminal impingement. 5. Left common carotid atherosclerotic calcification. 6. Chronic paranasal sinusitis. Acute on chronic bilateral maxillary sinusitis.  PATIENT SURVEYS:  Quick Dash 56.8% disability   COGNITION:  Overall cognitive status: Within functional limits for tasks assessed     SENSATION: Slightly diminished L thumb  POSTURE: Forward head posture, rounded shoulder  CERVICAL ROM:  AROM eval  Cervical Flexion 40  Cervical Extension 45  Cervical Rotation to Left 70  Cervical Rotation to Right 70  Left Sidebend 40  Right Sidebend 30  No pain just tightness with movement.   UPPER EXTREMITY ROM:   full PROM bil,  AROM limited by pain.   Active ROM Right*  eval Left eval  Shoulder flexion 120 125*  Shoulder extension 75 75  Shoulder abduction 120 125  Shoulder internal rotation  Functional T12 Functional T12  Shoulder external rotation Functional T2 Functional T2  Elbow flexion    Elbow extension    Wrist flexion    Wrist extension    (Blank rows = not tested)  UPPER EXTREMITY MMT:  MMT Right eval Left eval  Shoulder flexion 4* 4*  Shoulder extension    Shoulder abduction 4* 4*  Shoulder adduction    Shoulder internal rotation 5 5  Shoulder external rotation 5 5  Elbow flexion 5 5  Elbow extension 5 5  Wrist flexion    Wrist extension 5 5  Wrist ulnar deviation    Wrist radial deviation    Wrist pronation    Wrist supination    Grip strength  good good  (Blank rows = not tested)   *pain  SHOULDER SPECIAL TESTS:  Painful arc bil  Negative spurling  JOINT MOBILITY TESTING:  Full PROM, no capsular tightness  PALPATION:  No pain with palpation bil shoulders.  Noted increased cervical lordosis and hypomobility, tightness anterior scalenes, levator, upper trap, and cervical paraspinals.     TODAY'S TREATMENT:  06/16/22 Therapeutic Exercise: Chin tuck 10x Seated scap retraction 10x Seated shoulder roll 10x Seated UT stretch 2x30" bil Seated levator stretch 2x30" bil Standing row with yellow TB x 10 Standing extension with yellow TB x 10   Self Care: See patient education.  Education on posture, recommendations to avoid prolonged forward head posture with reading/ipad - try cookbook stand to prop up to improve posture.    Therapeutic Exercise: to improve strength and mobility.  Demo, verbal and tactile cues throughout for technique. - Seated Cervical Retraction  - 10 reps - Seated Scapular Retraction - 10 reps - Seated Shoulder Rolls  -RETRO - 10 reps  PATIENT EDUCATION: Education details: education on findings, POC, posture, initial HEP Person educated: Patient Education method: Consulting civil engineer, Demonstration, Verbal cues, and Handouts Education comprehension: verbalized understanding and returned demonstration   HOME EXERCISE PROGRAM: Access Code: [email or web address] URL: [email or web address] Date: 06/16/2022 Prepared by: Clarene Essex  Exercises - Seated Cervical Retraction  - 3 x daily - 7 x weekly - 1 sets - 10 reps - Seated Scapular Retraction  - 3 x daily - 7 x weekly - 1 sets -  10 reps - Seated Shoulder Rolls  - 3 x daily - 7 x weekly - 1 sets - 10 reps - Standing Bilateral Low Shoulder Row with Anchored Resistance  - 1 x daily - 7 x weekly - 3 sets - 10 reps - Seated Upper Trapezius Stretch  - 2 x daily - 7 x weekly - 2 sets - 30 sec hold - Gentle Levator Scapulae Stretch  - 2 x daily - 7 x weekly - 2 sets - 30 sec  hold  ASSESSMENT:  CLINICAL IMPRESSION: Pt tolerated the progression of exercises well. Progressed postural strength with using yellow TB. Provided cues to keep scap depression throughout session and guided patient through movements of neck stretches. Updated HEP and provided instruction as needed. Pt would continue to benefit from more progression of postural exercises    OBJECTIVE IMPAIRMENTS decreased activity tolerance, decreased ROM, decreased strength, increased fascial restrictions, impaired perceived functional ability, increased muscle spasms, impaired sensation, impaired UE functional use, postural dysfunction, and pain.   ACTIVITY LIMITATIONS carrying, lifting, bending, sleeping, bed mobility, dressing, reach over head, and hygiene/grooming  PARTICIPATION LIMITATIONS: cleaning and laundry  PERSONAL FACTORS Age and 3+ comorbidities: h/o breast cancer, CHF, diverticulitis, hearing loss, neuropathy of feet due to chemotherapy, R hip OA, HTN, bil hand OA  are also affecting patient's functional outcome.   REHAB POTENTIAL: Good  CLINICAL DECISION MAKING: Evolving/moderate complexity  EVALUATION COMPLEXITY: Moderate   GOALS: Goals reviewed with patient? Yes  SHORT TERM GOALS: Target date: 06/25/2022   Patient will be independent with initial HEP.  Baseline: given Goal status: INITIAL   LONG TERM GOALS: Target date: 07/23/2022   Patient will be independent with advanced/ongoing HEP to improve outcomes and carryover.  Baseline: needs progression Goal status: INITIAL  2.  Patient will report 75% improvement in bil shoulder pain to improve QOL.  Baseline: 6-9/10 Goal status: INITIAL  3.  Patient to demonstrate improved upright posture with posterior shoulder girdle engaged to promote improved glenohumeral joint mobility. Baseline: forward head posture Goal status: INITIAL  4.  Patient to improve bil shoulder AROM to [facility] without pain provocation to allow for increased  ease of ADLs.  Baseline: see objective,  AROM limited by pain Goal status: INITIAL  5.  Patient will demonstrate improved functional UE strength as demonstrated by 5/5 bil shoulder strength. Baseline: 4/5 bil limited by pain Goal status: INITIAL  6  Patient will report <40% impairment on quickDash  to demonstrate improved functional ability.  Baseline: 56.8% impairment.  Goal status: INITIAL  7.  Patient will be able to read without numbness/tingling in hands.    Baseline: increases, difficulty holding hardback books Goal status: INITIAL   8. Patient will report decreased bil shoulder pain in mornings to be able to use arms to get out of bed.  Baseline: has to swing legs out, cant use arms Goal status: INITIAL    PLAN: PT FREQUENCY: 1-2x/week  PT DURATION: 6 weeks  PLANNED INTERVENTIONS: Therapeutic exercises, Therapeutic activity, Neuromuscular re-education, Balance training, Gait training, Patient/Family education, Self Care, Joint mobilization, Dry Needling, Electrical stimulation, Spinal mobilization, Cryotherapy, Moist heat, Traction, Ultrasound, Ionotophoresis 49m/ml Dexamethasone, Manual therapy, and Re-evaluation  PLAN FOR NEXT SESSION: postural strengthening, neck ROM and stretching, manual therapy to decrease tightness cervical spine, modalities PRN, may trial traction   BArtist Pais PTA 06/16/2022, 5:09 PM

## 2022-06-17 ENCOUNTER — Ambulatory Visit: Payer: Medicare Other

## 2022-06-19 ENCOUNTER — Ambulatory Visit: Payer: Medicare Other | Admitting: Physical Therapy

## 2022-06-19 ENCOUNTER — Encounter: Payer: Self-pay | Admitting: Physical Therapy

## 2022-06-19 DIAGNOSIS — M25512 Pain in left shoulder: Secondary | ICD-10-CM

## 2022-06-19 DIAGNOSIS — M6281 Muscle weakness (generalized): Secondary | ICD-10-CM | POA: Diagnosis not present

## 2022-06-19 DIAGNOSIS — R293 Abnormal posture: Secondary | ICD-10-CM | POA: Diagnosis not present

## 2022-06-19 DIAGNOSIS — M25511 Pain in right shoulder: Secondary | ICD-10-CM

## 2022-06-19 NOTE — Therapy (Signed)
OUTPATIENT PHYSICAL THERAPY TREATMENT   Patient Name: Carla Bennett MRN: [id number] DOB:Nov 24, 1934, 87 y.o., female Today's Date: 06/19/2022   PT End of Session - 06/19/22 1501     Visit Number 3    Number of Visits 12    Date for PT Re-Evaluation 07/23/22    Authorization Type Medicare + [employer] supplement    Progress Note Due on Visit 10    PT Start Time 1457    Activity Tolerance Patient tolerated treatment well    Behavior During Therapy [facility] for tasks assessed/performed              Past Medical History:  Diagnosis Date   Allergy    Aortic regurgitation    Arthritis    hands, neck   Basal cell carcinoma    Benign neoplasm of colon    Breast cancer of upper-outer quadrant of left female breast ([place]) 02/23/2015   4'16-left breast cancer-surgery, chemo, radiation-[name] follows every 3 months   Cataract    beginning stages-monitoring by MD   Cholelithiasis    resolved with surgery   Chronic systolic CHF (congestive heart failure) ([place]) 02/26/2016   A.  Echo 02/21/16:  Mild LVH, EF 20-25%, diff HK, Gr 3 DD, mod AI, mod to severe MR, mod LAE, mod redcued RVSF, mild RAE, mod TR, PASP 53 mmHg, small pericardial effusion, mod L pleural effusion  //  B. Echo 6/17:  Mild LVH EF 30-35%, diffuse HK, grade 1 diastolic dysfunction, trivial AI, MAC, mild LAE, normal RVSF, trivial pericardial effusion     Cystocele    Diverticulitis    Diverticulosis of colon (without mention of hemorrhage)    Gout    resolved -   Hearing loss    some hearing loss in right ear- no hearing aids   Heart murmur    Hematuria    negative urology work up   History of blood transfusion 1971   x 1 with D&C -    Hx of abnormal cervical Pap smear    LEEP-CIN I, negative margins and ECC   Hypercalcemia    Hypertension    Mitral regurgitation    Neuromuscular disorder (HCC)    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   OA (osteoarthritis) of hip    right   Other issue of medical certificates     uterine myomata   Parathyroid abnormality ([place])    being followed Dr. Barbette Hair   Radiation 10/01/15-11/16/15   left breast axillary and supraclavicular reigion 45 gray, left breast 50.4 gray, lumpectomy cavity boost 10 gray   Renal disease 3/16   Stage 3 kidney disease-seeing Dr Buddy Duty and Dr Marval Regal   Uterine prolaps    resolved after surgery   Vitamin D deficiency    Past Surgical History:  Procedure Laterality Date   BREAST LUMPECTOMY Left 08/2015   CHOLECYSTECTOMY     COLONOSCOPY  01/18/2013   [name]   COLONOSCOPY N/A 06/26/2016   Procedure: COLONOSCOPY;  Surgeon: Irene Shipper, MD;  Location: [facility];  Service: Endoscopy;  Laterality: N/A;   CYSTOCELE REPAIR  08/2010   Rectocele, vault prolapse   DILATION AND CURETTAGE OF UTERUS     x2   PORT-A-CATH REMOVAL Right 08/23/2015   Procedure: REMOVAL PORT-A-CATH;  Surgeon: Erroll Luna, MD;  Location: [facility];  Service: General;  Laterality: Right;   PORTACATH PLACEMENT Right 03/20/2015   Procedure: INSERTION PORT-A-CATH;  Surgeon: Erroll Luna, MD;  Location: [facility];  Service: General;  Laterality: Right;   RADIOACTIVE SEED GUIDED PARTIAL MASTECTOMY WITH AXILLARY SENTINEL LYMPH NODE BIOPSY Left 08/23/2015   Procedure: LEFT BREAST PARTIAL MASTECTOMY WITH SENTINEL LYMPH NODE MAPPING;  Surgeon: Erroll Luna, MD;  Location: [facility];  Service: General;  Laterality: Left;   TONSILLECTOMY AND ADENOIDECTOMY     TOTAL VAGINAL HYSTERECTOMY     secondary to prolapse, ovaries not removed   Patient Active Problem List   Diagnosis Date Noted   Mixed hyperlipidemia 08/21/2021   Diverticulosis of colon without hemorrhage    Chronic systolic CHF (congestive heart failure) ([place]) 02/26/2016   Dilated cardiomyopathy ([place]) 02/26/2016   CKD (chronic kidney disease) 02/26/2016   Essential hypertension 07/26/2015   Family history of malignant neoplasm of gastrointestinal tract 03/08/2015   Breast cancer of  upper-outer quadrant of left female breast ([place]) 02/23/2015   Hematochezia 01/06/2013    PCP: Donald Prose, MD  REFERRING PROVIDER: Olen Cordial, PA  REFERRING DIAG: M25.511 (ICD-10-CM) -Bilateral shoulder pain  THERAPY DIAG:  Acute pain of left shoulder  Acute pain of right shoulder  Abnormal posture  Muscle weakness (generalized)  Rationale for Evaluation and Treatment Rehabilitation  ONSET DATE: 5 weeks ago  SUBJECTIVE:                                                                                                                                                                                      SUBJECTIVE STATEMENT: Pt going from two pillows to 1 pillow has helped a lot, but still having more R shoulder pain today and R hand numbness.    PERTINENT HISTORY: PMH: basal cell carcinoma, breast cancer, CHF, diverticulitis, hearing loss, neuropathy of feet due to chemotherapy, R hip OA, HTN, bil hand OA  PAIN:  Are you having pain? Yes: NPRS scale: 6/10 Pain location: R & L shoulder  Pain description: ache, sharp, shooting, weakness in hand  Aggravating factors: horrible in the morning, difficulty getting out of bed Relieving factors: tylenol and pain creme   PRECAUTIONS: Other: history of L sided breast cancer, keep resistance low.    WEIGHT BEARING RESTRICTIONS No  FALLS:  Has patient fallen in last 6 months? No  LIVING ENVIRONMENT: Lives with: lives alone Lives in: House/apartment Stairs: No Has following equipment at home: Grab bars  OCCUPATION: Retired   PLOF: Independent  PATIENT GOALS "Be able to clean house like I used too pain free"  OBJECTIVE:   DIAGNOSTIC FINDINGS:  Cervical MRI 12/18/2020 IMPRESSION: 1. No acute fracture or acute intracranial findings. 2. Right facial soft tissue swelling overlying the maxilla. Probable soft tissue swelling along the chin eccentric to the right.  3. Periventricular white matter and corona radiata  hypodensities favor chronic ischemic microvascular white matter disease. 4. Cervical spondylosis and degenerative disc disease causing multilevel osseous foraminal impingement. 5. Left common carotid atherosclerotic calcification. 6. Chronic paranasal sinusitis. Acute on chronic bilateral maxillary sinusitis.  PATIENT SURVEYS:  Quick Dash 56.8% disability   COGNITION:  Overall cognitive status: Within functional limits for tasks assessed     SENSATION: Slightly diminished L thumb  POSTURE: Forward head posture, rounded shoulder  CERVICAL ROM:  AROM eval  Cervical Flexion 40  Cervical Extension 45  Cervical Rotation to Left 70  Cervical Rotation to Right 70  Left Sidebend 40  Right Sidebend 30  No pain just tightness with movement.   UPPER EXTREMITY ROM:   full PROM bil,  AROM limited by pain.   Active ROM Right*  eval Left eval  Shoulder flexion 120 125*  Shoulder extension 75 75  Shoulder abduction 120 125  Shoulder internal rotation  Functional T12 Functional T12  Shoulder external rotation Functional T2 Functional T2  Elbow flexion    Elbow extension    Wrist flexion    Wrist extension    (Blank rows = not tested)  UPPER EXTREMITY MMT:  MMT Right eval Left eval  Shoulder flexion 4* 4*  Shoulder extension    Shoulder abduction 4* 4*  Shoulder adduction    Shoulder internal rotation 5 5  Shoulder external rotation 5 5  Elbow flexion 5 5  Elbow extension 5 5  Wrist flexion    Wrist extension 5 5  Wrist ulnar deviation    Wrist radial deviation    Wrist pronation    Wrist supination    Grip strength  good good  (Blank rows = not tested)  *pain  SHOULDER SPECIAL TESTS:  Painful arc bil  Negative spurling  JOINT MOBILITY TESTING:  Full PROM, no capsular tightness  PALPATION:  No pain with palpation bil shoulders.  Noted increased cervical lordosis and hypomobility, tightness anterior scalenes, levator, upper trap, and cervical paraspinals.      TODAY'S TREATMENT:  06/19/2022 Therapeutic Exercise: to improve strength and mobility.  Demo, verbal and tactile cues throughout for technique. UBE L1 x 4 min forward Nerve glides in sitting median tray x 10 bil  Levator stretch R Scalene stretch R Manual Therapy: to decrease muscle spasm and pain and improve mobility 1st rib mobs, STM to R UT, scalenes, in supine gentle manual cervical traction, suboccipital release, PA mobs cervical and upper thoracic spine, NAGs into rotation, STM/TPR cervical paraspinals.   06/16/22 Therapeutic Exercise: Chin tuck 10x Seated scap retraction 10x Seated shoulder roll 10x Seated UT stretch 2x30" bil Seated levator stretch 2x30" bil Standing row with yellow TB x 10 Standing extension with yellow TB x 10   06/11/2022 Self Care: See patient education.  Education on posture, recommendations to avoid prolonged forward head posture with reading/ipad - try cookbook stand to prop up to improve posture.    Therapeutic Exercise: to improve strength and mobility.  Demo, verbal and tactile cues throughout for technique. - Seated Cervical Retraction  - 10 reps - Seated Scapular Retraction - 10 reps - Seated Shoulder Rolls  -RETRO - 10 reps  PATIENT EDUCATION: Education details: added nerve glide to HEP, recommendations for pillow for sleep Person educated: Patient Education method: Explanation, Demonstration, Verbal cues, and Handouts Education comprehension: verbalized understanding and returned demonstration   HOME EXERCISE PROGRAM: Access Code: [email or web address]  ASSESSMENT:  CLINICAL IMPRESSION: Carlis Stable Goncalves still reporting  bil shoulder pain R>L and R hand numbness.  Good compliance with HEP meeting STG #1. Noted significantly more tightness in R anterior scalenes and elevated first rib on R.  Reviewed stretches and added nerve glide, followed by manual therapy to neck and upper thoracic region.  Noted patient has improved upright posture.  Faithanne L  C[place]au continues to demonstrate potential for improvement and would benefit from continued skilled therapy to address impairments.     OBJECTIVE IMPAIRMENTS decreased activity tolerance, decreased ROM, decreased strength, increased fascial restrictions, impaired perceived functional ability, increased muscle spasms, impaired sensation, impaired UE functional use, postural dysfunction, and pain.   ACTIVITY LIMITATIONS carrying, lifting, bending, sleeping, bed mobility, dressing, reach over head, and hygiene/grooming  PARTICIPATION LIMITATIONS: cleaning and laundry  PERSONAL FACTORS Age and 3+ comorbidities: h/o breast cancer, CHF, diverticulitis, hearing loss, neuropathy of feet due to chemotherapy, R hip OA, HTN, bil hand OA  are also affecting patient's functional outcome.   REHAB POTENTIAL: Good  CLINICAL DECISION MAKING: Evolving/moderate complexity  EVALUATION COMPLEXITY: Moderate   GOALS: Goals reviewed with patient? Yes  SHORT TERM GOALS: Target date: 06/25/2022   Patient will be independent with initial HEP.  Baseline: given Goal status: MET 06/19/22 - reports good compliance and performing 2-3x daily.    LONG TERM GOALS: Target date: 07/23/2022   Patient will be independent with advanced/ongoing HEP to improve outcomes and carryover.  Baseline: needs progression Goal status: IN PROGRESS  2.  Patient will report 75% improvement in bil shoulder pain to improve QOL.  Baseline: 6-9/10 Goal status: IN PROGRESS  3.  Patient to demonstrate improved upright posture with posterior shoulder girdle engaged to promote improved glenohumeral joint mobility. Baseline: forward head posture Goal status: IN PROGRESS 06/19/2022 - noted improved upright posture without cuing today.  Patient much more aware of posture.    4.  Patient to improve bil shoulder AROM to [facility] without pain provocation to allow for increased ease of ADLs.  Baseline: see objective,  AROM limited by pain Goal status:  IN PROGRESS  5.  Patient will demonstrate improved functional UE strength as demonstrated by 5/5 bil shoulder strength. Baseline: 4/5 bil limited by pain Goal status: IN PROGRESS  6  Patient will report <40% impairment on quickDash  to demonstrate improved functional ability.  Baseline: 56.8% impairment.  Goal status: IN PROGRESS  7.  Patient will be able to read without numbness/tingling in hands.    Baseline: increases, difficulty holding hardback books Goal status: IN PROGRESS   8. Patient will report decreased bil shoulder pain in mornings to be able to use arms to get out of bed.  Baseline: has to swing legs out, cant use arms Goal status: IN PROGRESS    PLAN: PT FREQUENCY: 1-2x/week  PT DURATION: 6 weeks  PLANNED INTERVENTIONS: Therapeutic exercises, Therapeutic activity, Neuromuscular re-education, Balance training, Gait training, Patient/Family education, Self Care, Joint mobilization, Dry Needling, Electrical stimulation, Spinal mobilization, Cryotherapy, Moist heat, Traction, Ultrasound, Ionotophoresis 65m/ml Dexamethasone, Manual therapy, and Re-evaluation  PLAN FOR NEXT SESSION: postural strengthening, neck ROM and stretching, manual therapy to decrease tightness cervical spine, modalities PRN, may trial traction   ERennie Natter PT, DPT 06/19/2022, 3:01 PM

## 2022-06-24 ENCOUNTER — Encounter: Payer: Self-pay | Admitting: Physical Therapy

## 2022-06-24 ENCOUNTER — Encounter: Payer: Medicare Other | Admitting: Physical Therapy

## 2022-06-24 ENCOUNTER — Ambulatory Visit: Payer: Medicare Other | Attending: Physician Assistant | Admitting: Physical Therapy

## 2022-06-24 DIAGNOSIS — M25512 Pain in left shoulder: Secondary | ICD-10-CM | POA: Insufficient documentation

## 2022-06-24 DIAGNOSIS — M25511 Pain in right shoulder: Secondary | ICD-10-CM | POA: Insufficient documentation

## 2022-06-24 DIAGNOSIS — R293 Abnormal posture: Secondary | ICD-10-CM | POA: Insufficient documentation

## 2022-06-24 DIAGNOSIS — M6281 Muscle weakness (generalized): Secondary | ICD-10-CM | POA: Diagnosis not present

## 2022-06-24 NOTE — Therapy (Signed)
OUTPATIENT PHYSICAL THERAPY TREATMENT   Patient Name: Carla Bennett MRN: [id number] DOB:1934/12/11, 87 y.o., female Today's Date: 06/24/2022   PT End of Session - 06/24/22 1027     Visit Number 4    Number of Visits 12    Date for PT Re-Evaluation 07/23/22    Authorization Type Medicare + [employer] supplement    Progress Note Due on Visit 10    PT Start Time 1017    PT Stop Time 1108    PT Time Calculation (min) 51 min    Activity Tolerance Patient tolerated treatment well    Behavior During Therapy WFL for tasks assessed/performed               Past Medical History:  Diagnosis Date   Allergy    Aortic regurgitation    Arthritis    hands, neck   Basal cell carcinoma    Benign neoplasm of colon    Breast cancer of upper-outer quadrant of left female breast ([place]) 02/23/2015   4'16-left breast cancer-surgery, chemo, radiation-[name] follows every 3 months   Cataract    beginning stages-monitoring by MD   Cholelithiasis    resolved with surgery   Chronic systolic CHF (congestive heart failure) ([place]) 02/26/2016   A.  Echo 02/21/16:  Mild LVH, EF 20-25%, diff HK, Gr 3 DD, mod AI, mod to severe MR, mod LAE, mod redcued RVSF, mild RAE, mod TR, PASP 53 mmHg, small pericardial effusion, mod L pleural effusion  //  B. Echo 6/17:  Mild LVH EF 30-35%, diffuse HK, grade 1 diastolic dysfunction, trivial AI, MAC, mild LAE, normal RVSF, trivial pericardial effusion     Cystocele    Diverticulitis    Diverticulosis of colon (without mention of hemorrhage)    Gout    resolved -   Hearing loss    some hearing loss in right ear- no hearing aids   Heart murmur    Hematuria    negative urology work up   History of blood transfusion 1971   x 1 with D&C -    Hx of abnormal cervical Pap smear    LEEP-CIN I, negative margins and ECC   Hypercalcemia    Hypertension    Mitral regurgitation    Neuromuscular disorder (HCC)    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   OA (osteoarthritis) of  hip    right   Other issue of medical certificates    uterine myomata   Parathyroid abnormality ([place])    being followed Dr. Barbette Hair   Radiation 10/01/15-11/16/15   left breast axillary and supraclavicular reigion 45 gray, left breast 50.4 gray, lumpectomy cavity boost 10 gray   Renal disease 3/16   Stage 3 kidney disease-seeing Dr Buddy Duty and Dr Marval Regal   Uterine prolaps    resolved after surgery   Vitamin D deficiency    Past Surgical History:  Procedure Laterality Date   BREAST LUMPECTOMY Left 08/2015   CHOLECYSTECTOMY     COLONOSCOPY  01/18/2013   [name]   COLONOSCOPY N/A 06/26/2016   Procedure: COLONOSCOPY;  Surgeon: Irene Shipper, MD;  Location: [facility];  Service: Endoscopy;  Laterality: N/A;   CYSTOCELE REPAIR  08/2010   Rectocele, vault prolapse   DILATION AND CURETTAGE OF UTERUS     x2   PORT-A-CATH REMOVAL Right 08/23/2015   Procedure: REMOVAL PORT-A-CATH;  Surgeon: Erroll Luna, MD;  Location: [facility];  Service: General;  Laterality: Right;   [place]  Right 03/20/2015   Procedure: INSERTION PORT-A-CATH;  Surgeon: Erroll Luna, MD;  Location: [facility];  Service: General;  Laterality: Right;   RADIOACTIVE SEED GUIDED PARTIAL MASTECTOMY WITH AXILLARY SENTINEL LYMPH NODE BIOPSY Left 08/23/2015   Procedure: LEFT BREAST PARTIAL MASTECTOMY WITH SENTINEL LYMPH NODE MAPPING;  Surgeon: Erroll Luna, MD;  Location: [facility];  Service: General;  Laterality: Left;   TONSILLECTOMY AND ADENOIDECTOMY     TOTAL VAGINAL HYSTERECTOMY     secondary to prolapse, ovaries not removed   Patient Active Problem List   Diagnosis Date Noted   Mixed hyperlipidemia 08/21/2021   Diverticulosis of colon without hemorrhage    Chronic systolic CHF (congestive heart failure) ([place]) 02/26/2016   Dilated cardiomyopathy ([place]) 02/26/2016   CKD (chronic kidney disease) 02/26/2016   Essential hypertension 07/26/2015   Family history of malignant neoplasm of  gastrointestinal tract 03/08/2015   Breast cancer of upper-outer quadrant of left female breast ([place]) 02/23/2015   Hematochezia 01/06/2013    PCP: Donald Prose, MD  REFERRING PROVIDER: Olen Cordial, PA  REFERRING DIAG: M25.511 (ICD-10-CM) -Bilateral shoulder pain  THERAPY DIAG:  Acute pain of left shoulder  Acute pain of right shoulder  Abnormal posture  Muscle weakness (generalized)  Rationale for Evaluation and Treatment Rehabilitation  ONSET DATE: 5 weeks ago  SUBJECTIVE:                                                                                                                                                                                      SUBJECTIVE STATEMENT:  I'm here. I feel very weak and sore. Mornings are not good for me BP and digestive system wise. I made a mistake of taking a shower which makes my blood pressure drop too. BP was 110/80s when I took it this morning. I bought new pillows yesterday, sleeping on the new pillow really made a difference as to how I'm feeling in the mornings. This morning I was able to get out of bed much easier.   PERTINENT HISTORY: PMH: basal cell carcinoma, breast cancer, CHF, diverticulitis, hearing loss, neuropathy of feet due to chemotherapy, R hip OA, HTN, bil hand OA  PAIN:  Are you having pain? Yes: NPRS scale: 7/10 Pain location: more in B arms today not really in the shoulders  Pain description: very sore  Aggravating factors: mornings, using too many pillows   Relieving factors: arthritis medicines and asper cream   PRECAUTIONS: Other: history of L sided breast cancer, keep resistance low.    WEIGHT BEARING RESTRICTIONS No  FALLS:  Has patient fallen in last 6 months? No  LIVING ENVIRONMENT: Lives with: lives alone Lives  in: House/apartment Stairs: No Has following equipment at home: Grab bars  OCCUPATION: Retired   PLOF: [place] "Be able to clean house like I used too pain  free"  OBJECTIVE:   DIAGNOSTIC FINDINGS:  Cervical MRI 12/18/2020 IMPRESSION: 1. No acute fracture or acute intracranial findings. 2. Right facial soft tissue swelling overlying the maxilla. Probable soft tissue swelling along the chin eccentric to the right. 3. Periventricular white matter and corona radiata hypodensities favor chronic ischemic microvascular white matter disease. 4. Cervical spondylosis and degenerative disc disease causing multilevel osseous foraminal impingement. 5. Left common carotid atherosclerotic calcification. 6. Chronic paranasal sinusitis. Acute on chronic bilateral maxillary sinusitis.  PATIENT SURVEYS:  Quick Dash 56.8% disability   COGNITION:  Overall cognitive status: Within functional limits for tasks assessed     SENSATION: Slightly diminished L thumb  POSTURE: Forward head posture, rounded shoulder  CERVICAL ROM:  AROM eval  Cervical Flexion 40  Cervical Extension 45  Cervical Rotation to Left 70  Cervical Rotation to Right 70  Left Sidebend 40  Right Sidebend 30  No pain just tightness with movement.   UPPER EXTREMITY ROM:   full PROM bil,  AROM limited by pain.   Active ROM Right*  eval Left eval  Shoulder flexion 120 125*  Shoulder extension 75 75  Shoulder abduction 120 125  Shoulder internal rotation  Functional T12 Functional T12  Shoulder external rotation Functional T2 Functional T2  Elbow flexion    Elbow extension    Wrist flexion    Wrist extension    (Blank rows = not tested)  UPPER EXTREMITY MMT:  MMT Right eval Left eval  Shoulder flexion 4* 4*  Shoulder extension    Shoulder abduction 4* 4*  Shoulder adduction    Shoulder internal rotation 5 5  Shoulder external rotation 5 5  Elbow flexion 5 5  Elbow extension 5 5  Wrist flexion    Wrist extension 5 5  Wrist ulnar deviation    Wrist radial deviation    Wrist pronation    Wrist supination    Grip strength  good good  (Blank rows = not tested)   *pain  SHOULDER SPECIAL TESTS:  Painful arc bil  Negative spurling  JOINT MOBILITY TESTING:  Full PROM, no capsular tightness  PALPATION:  No pain with palpation bil shoulders.  Noted increased cervical lordosis and hypomobility, tightness anterior scalenes, levator, upper trap, and cervical paraspinals.     TODAY'S TREATMENT:   06/24/22  TherEx:  -UBE L2 x3 min forward/3 min backward   -Chin tucks x10 with 3 second holds -Chin tucks with rotation x10 B 3 second holds -Chin tucks with lateral flexion x10 3 second holds  -Scapular retractions yellow TB x15 3 second holds  -Shoulder extensions x10 yellow TB  - Scapular retraction with B ER yellow TB x10 2 second holds  - 3 way reaches on wall with yellow TB periscap stability 1x5 B  - stability flexion x10 with yellow TB cues for periscap activation - 3D thoracic excursions x10 all directions   101/80 supine 99/65 sitting   06/19/2022 Therapeutic Exercise: to improve strength and mobility.  Demo, verbal and tactile cues throughout for technique. UBE L1 x 4 min forward Nerve glides in sitting median tray x 10 bil  Levator stretch R Scalene stretch R Manual Therapy: to decrease muscle spasm and pain and improve mobility 1st rib mobs, STM to R UT, scalenes, in supine gentle manual cervical traction, suboccipital  release, PA mobs cervical and upper thoracic spine, NAGs into rotation, STM/TPR cervical paraspinals.   06/16/22 Therapeutic Exercise: Chin tuck 10x Seated scap retraction 10x Seated shoulder roll 10x Seated UT stretch 2x30" bil Seated levator stretch 2x30" bil Standing row with yellow TB x 10 Standing extension with yellow TB x 10    06/11/2022 Self Care: See patient education.  Education on posture, recommendations to avoid prolonged forward head posture with reading/ipad - try cookbook stand to prop up to improve posture.    Therapeutic Exercise: to improve strength and mobility.  Demo, verbal and tactile cues  throughout for technique. - Seated Cervical Retraction  - 10 reps - Seated Scapular Retraction - 10 reps - Seated Shoulder Rolls  -RETRO - 10 reps  PATIENT EDUCATION: Education details: added nerve glide to HEP, recommendations for pillow for sleep Person educated: Patient Education method: Explanation, Demonstration, Verbal cues, and Handouts Education comprehension: verbalized understanding and returned demonstration   HOME EXERCISE PROGRAM: Access Code: [email or web address]  ASSESSMENT:  CLINICAL IMPRESSION:  Carla Bennett arrives today doing OK, feeling a bit better after getting some brand new pillows this weekend. Continued working on general postural training and strengthening/general mobility. Seems to be making slow but steady progress, will continue efforts.   At EOS became very dizzy and pale sitting at edge of mat table, we got her in supine and took BP- 101/80 supine then 99/65 after return to sitting. Looked and felt much better after resting in supine with no further dizziness, did advise her to call PCP as soon as possible with these BP numbers as they are much lower than her usual (140/80s). Left clinic feeling well and without difficulty. We will continue to monitor BPs moving forward.  OBJECTIVE IMPAIRMENTS decreased activity tolerance, decreased ROM, decreased strength, increased fascial restrictions, impaired perceived functional ability, increased muscle spasms, impaired sensation, impaired UE functional use, postural dysfunction, and pain.   ACTIVITY LIMITATIONS carrying, lifting, bending, sleeping, bed mobility, dressing, reach over head, and hygiene/grooming  PARTICIPATION LIMITATIONS: cleaning and laundry  PERSONAL FACTORS Age and 3+ comorbidities: h/o breast cancer, CHF, diverticulitis, hearing loss, neuropathy of feet due to chemotherapy, R hip OA, HTN, bil hand OA  are also affecting patient's functional outcome.   REHAB POTENTIAL: Good  CLINICAL DECISION MAKING:  Evolving/moderate complexity  EVALUATION COMPLEXITY: Moderate   GOALS: Goals reviewed with patient? Yes  SHORT TERM GOALS: Target date: 06/25/2022   Patient will be independent with initial HEP.  Baseline: given Goal status: MET 06/19/22 - reports good compliance and performing 2-3x daily.    LONG TERM GOALS: Target date: 07/23/2022   Patient will be independent with advanced/ongoing HEP to improve outcomes and carryover.  Baseline: needs progression Goal status: IN PROGRESS  2.  Patient will report 75% improvement in bil shoulder pain to improve QOL.  Baseline: 6-9/10 Goal status: IN PROGRESS  3.  Patient to demonstrate improved upright posture with posterior shoulder girdle engaged to promote improved glenohumeral joint mobility. Baseline: forward head posture Goal status: IN PROGRESS 06/19/2022 - noted improved upright posture without cuing today.  Patient much more aware of posture.    4.  Patient to improve bil shoulder AROM to [facility] without pain provocation to allow for increased ease of ADLs.  Baseline: see objective,  AROM limited by pain Goal status: IN PROGRESS  5.  Patient will demonstrate improved functional UE strength as demonstrated by 5/5 bil shoulder strength. Baseline: 4/5 bil limited by pain Goal status: IN PROGRESS  6  Patient will report <40% impairment on quickDash  to demonstrate improved functional ability.  Baseline: 56.8% impairment.  Goal status: IN PROGRESS  7.  Patient will be able to read without numbness/tingling in hands.    Baseline: increases, difficulty holding hardback books Goal status: IN PROGRESS   8. Patient will report decreased bil shoulder pain in mornings to be able to use arms to get out of bed.  Baseline: has to swing legs out, cant use arms Goal status: IN PROGRESS    PLAN: PT FREQUENCY: 1-2x/week  PT DURATION: 6 weeks  PLANNED INTERVENTIONS: Therapeutic exercises, Therapeutic activity, Neuromuscular re-education, Balance  training, Gait training, Patient/Family education, Self Care, Joint mobilization, Dry Needling, Electrical stimulation, Spinal mobilization, Cryotherapy, Moist heat, Traction, Ultrasound, Ionotophoresis 6m/ml Dexamethasone, Manual therapy, and Re-evaluation  PLAN FOR NEXT SESSION: postural strengthening, neck ROM and stretching, manual therapy to decrease tightness cervical spine, modalities PRN, may trial traction. Check blood pressures, consider orthostatics    Dagmar Adcox U PT DPT PN2  06/24/2022, 11:14 AM

## 2022-06-25 ENCOUNTER — Encounter: Payer: Medicare Other | Admitting: Physical Therapy

## 2022-06-27 ENCOUNTER — Ambulatory Visit: Payer: Medicare Other

## 2022-06-27 DIAGNOSIS — R293 Abnormal posture: Secondary | ICD-10-CM | POA: Diagnosis not present

## 2022-06-27 DIAGNOSIS — M6281 Muscle weakness (generalized): Secondary | ICD-10-CM | POA: Diagnosis not present

## 2022-06-27 DIAGNOSIS — M25511 Pain in right shoulder: Secondary | ICD-10-CM

## 2022-06-27 DIAGNOSIS — M25512 Pain in left shoulder: Secondary | ICD-10-CM | POA: Diagnosis not present

## 2022-06-27 NOTE — Therapy (Signed)
OUTPATIENT PHYSICAL THERAPY TREATMENT   Patient Name: Carla Bennett MRN: [id number] DOB:[birth year], 87 y.o., female Today's Date: 06/27/2022   PT End of Session - 06/27/22 1153     Visit Number 5    Number of Visits 12    Date for PT Re-Evaluation 07/23/22    Authorization Type Medicare + [employer] supplement    Progress Note Due on Visit 10    PT Start Time 1100    PT Stop Time 1145    PT Time Calculation (min) 45 min    Activity Tolerance Patient tolerated treatment well    Behavior During Therapy WFL for tasks assessed/performed                Past Medical History:  Diagnosis Date   Allergy    Aortic regurgitation    Arthritis    hands, neck   Basal cell carcinoma    Benign neoplasm of colon    Breast cancer of upper-outer quadrant of left female breast ([place]) 02/23/2015   4'16-left breast cancer-surgery, chemo, radiation-[name] follows every 3 months   Cataract    beginning stages-monitoring by MD   Cholelithiasis    resolved with surgery   Chronic systolic CHF (congestive heart failure) ([place]) 02/26/2016   A.  Echo 02/21/16:  Mild LVH, EF 20-25%, diff HK, Gr 3 DD, mod AI, mod to severe MR, mod LAE, mod redcued RVSF, mild RAE, mod TR, PASP 53 mmHg, small pericardial effusion, mod L pleural effusion  //  B. Echo 6/17:  Mild LVH EF 30-35%, diffuse HK, grade 1 diastolic dysfunction, trivial AI, MAC, mild LAE, normal RVSF, trivial pericardial effusion     Cystocele    Diverticulitis    Diverticulosis of colon (without mention of hemorrhage)    Gout    resolved -   Hearing loss    some hearing loss in right ear- no hearing aids   Heart murmur    Hematuria    negative urology work up   History of blood transfusion 1971   x 1 with D&C -    Hx of abnormal cervical Pap smear    LEEP-CIN I, negative margins and ECC   Hypercalcemia    Hypertension    Mitral regurgitation    Neuromuscular disorder (HCC)    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   OA (osteoarthritis) of  hip    right   Other issue of medical certificates    uterine myomata   Parathyroid abnormality ([place])    being followed Dr. Barbette Hair   Radiation 10/01/15-11/16/15   left breast axillary and supraclavicular reigion 45 gray, left breast 50.4 gray, lumpectomy cavity boost 10 gray   Renal disease 3/16   Stage 3 kidney disease-seeing Dr Buddy Duty and Dr Marval Regal   Uterine prolaps    resolved after surgery   Vitamin D deficiency    Past Surgical History:  Procedure Laterality Date   BREAST LUMPECTOMY Left 08/2015   CHOLECYSTECTOMY     COLONOSCOPY  01/18/2013   [name]   COLONOSCOPY N/A 06/26/2016   Procedure: COLONOSCOPY;  Surgeon: Irene Shipper, MD;  Location: [facility];  Service: Endoscopy;  Laterality: N/A;   CYSTOCELE REPAIR  08/2010   Rectocele, vault prolapse   DILATION AND CURETTAGE OF UTERUS     x2   PORT-A-CATH REMOVAL Right 08/23/2015   Procedure: REMOVAL PORT-A-CATH;  Surgeon: Erroll Luna, MD;  Location: [facility];  Service: General;  Laterality: Right;   PORTACATH  PLACEMENT Right 03/20/2015   Procedure: INSERTION PORT-A-CATH;  Surgeon: Erroll Luna, MD;  Location: [facility];  Service: General;  Laterality: Right;   RADIOACTIVE SEED GUIDED PARTIAL MASTECTOMY WITH AXILLARY SENTINEL LYMPH NODE BIOPSY Left 08/23/2015   Procedure: LEFT BREAST PARTIAL MASTECTOMY WITH SENTINEL LYMPH NODE MAPPING;  Surgeon: Erroll Luna, MD;  Location: [facility];  Service: General;  Laterality: Left;   TONSILLECTOMY AND ADENOIDECTOMY     TOTAL VAGINAL HYSTERECTOMY     secondary to prolapse, ovaries not removed   Patient Active Problem List   Diagnosis Date Noted   Mixed hyperlipidemia 08/21/2021   Diverticulosis of colon without hemorrhage    Chronic systolic CHF (congestive heart failure) ([place]) 02/26/2016   Dilated cardiomyopathy ([place]) 02/26/2016   CKD (chronic kidney disease) 02/26/2016   Essential hypertension 07/26/2015   Family history of malignant neoplasm of  gastrointestinal tract 03/08/2015   Breast cancer of upper-outer quadrant of left female breast ([place]) 02/23/2015   Hematochezia 01/06/2013    PCP: Donald Prose, MD  REFERRING PROVIDER: Olen Cordial, PA  REFERRING DIAG: M25.511 (ICD-10-CM) -Bilateral shoulder pain  THERAPY DIAG:  Acute pain of left shoulder  Acute pain of right shoulder  Abnormal posture  Muscle weakness (generalized)  Rationale for Evaluation and Treatment Rehabilitation  ONSET DATE: 5 weeks ago  SUBJECTIVE:                                                                                                                                                                                      SUBJECTIVE STATEMENT: Pt reports her BP was slightly elevated this morning nothing to worry about. Feeling better since last visit. Pt states that the morning appointments are not the best for her.   PERTINENT HISTORY: PMH: basal cell carcinoma, breast cancer, CHF, diverticulitis, hearing loss, neuropathy of feet due to chemotherapy, R hip OA, HTN, bil hand OA  PAIN:  Are you having pain? Yes: NPRS scale: 6/10 Pain location: more in B arms today not really in the shoulders  Pain description: very sore  Aggravating factors: mornings, using too many pillows   Relieving factors: arthritis medicines and asper cream   PRECAUTIONS: Other: history of L sided breast cancer, keep resistance low.    WEIGHT BEARING RESTRICTIONS No  FALLS:  Has patient fallen in last 6 months? No  LIVING ENVIRONMENT: Lives with: lives alone Lives in: House/apartment Stairs: No Has following equipment at home: Grab bars  OCCUPATION: Retired   PLOF: Independent  PATIENT GOALS "Be able to clean house like I used too pain free"  OBJECTIVE:   DIAGNOSTIC FINDINGS:  Cervical MRI 12/18/2020 IMPRESSION: 1. No acute fracture  or acute intracranial findings. 2. Right facial soft tissue swelling overlying the maxilla. Probable soft tissue  swelling along the chin eccentric to the right. 3. Periventricular white matter and corona radiata hypodensities favor chronic ischemic microvascular white matter disease. 4. Cervical spondylosis and degenerative disc disease causing multilevel osseous foraminal impingement. 5. Left common carotid atherosclerotic calcification. 6. Chronic paranasal sinusitis. Acute on chronic bilateral maxillary sinusitis.  PATIENT SURVEYS:  Quick Dash 56.8% disability   COGNITION:  Overall cognitive status: Within functional limits for tasks assessed     SENSATION: Slightly diminished L thumb  POSTURE: Forward head posture, rounded shoulder  CERVICAL ROM:  AROM eval  Cervical Flexion 40  Cervical Extension 45  Cervical Rotation to Left 70  Cervical Rotation to Right 70  Left Sidebend 40  Right Sidebend 30  No pain just tightness with movement.   UPPER EXTREMITY ROM:   full PROM bil,  AROM limited by pain.   Active ROM Right*  eval Left eval  Shoulder flexion 120 125*  Shoulder extension 75 75  Shoulder abduction 120 125  Shoulder internal rotation  Functional T12 Functional T12  Shoulder external rotation Functional T2 Functional T2  Elbow flexion    Elbow extension    Wrist flexion    Wrist extension    (Blank rows = not tested)  UPPER EXTREMITY MMT:  MMT Right eval Left eval  Shoulder flexion 4* 4*  Shoulder extension    Shoulder abduction 4* 4*  Shoulder adduction    Shoulder internal rotation 5 5  Shoulder external rotation 5 5  Elbow flexion 5 5  Elbow extension 5 5  Wrist flexion    Wrist extension 5 5  Wrist ulnar deviation    Wrist radial deviation    Wrist pronation    Wrist supination    Grip strength  good good  (Blank rows = not tested)  *pain  SHOULDER SPECIAL TESTS:  Painful arc bil  Negative spurling  JOINT MOBILITY TESTING:  Full PROM, no capsular tightness  PALPATION:  No pain with palpation bil shoulders.  Noted increased cervical  lordosis and hypomobility, tightness anterior scalenes, levator, upper trap, and cervical paraspinals.     TODAY'S TREATMENT:  06/27/22 Therapeutic Exercise: UBE L1 3 min fwd/3 min back Seated levator stretch 3x15" bil Seated thoracic ext arms crossed x 10 Standing shld ext x 10 yellow TB  Standing ER with yellow TB against doorframe x 10 Standing trunk rotation x 10 bil Standing thoracic ext x 10   Manual Therapy: STM to bil rhomboids, UT, LS - more tight UT on L 06/24/22  TherEx:  -UBE L2 x3 min forward/3 min backward   -Chin tucks x10 with 3 second holds -Chin tucks with rotation x10 B 3 second holds -Chin tucks with lateral flexion x10 3 second holds  -Scapular retractions yellow TB x15 3 second holds  -Shoulder extensions x10 yellow TB  - Scapular retraction with B ER yellow TB x10 2 second holds  - 3 way reaches on wall with yellow TB periscap stability 1x5 B  - stability flexion x10 with yellow TB cues for periscap activation - 3D thoracic excursions x10 all directions   101/80 supine 99/65 sitting   06/19/2022 Therapeutic Exercise: to improve strength and mobility.  Demo, verbal and tactile cues throughout for technique. UBE L1 x 4 min forward Nerve glides in sitting median tray x 10 bil  Levator stretch R Scalene stretch R Manual Therapy: to decrease muscle spasm and pain  and improve mobility 1st rib mobs, STM to R UT, scalenes, in supine gentle manual cervical traction, suboccipital release, PA mobs cervical and upper thoracic spine, NAGs into rotation, STM/TPR cervical paraspinals.   06/16/22 Therapeutic Exercise: Chin tuck 10x Seated scap retraction 10x Seated shoulder roll 10x Seated UT stretch 2x30" bil Seated levator stretch 2x30" bil Standing row with yellow TB x 10 Standing extension with yellow TB x 10    06/11/2022 Self Care: See patient education.  Education on posture, recommendations to avoid prolonged forward head posture with reading/ipad - try  cookbook stand to prop up to improve posture.    Therapeutic Exercise: to improve strength and mobility.  Demo, verbal and tactile cues throughout for technique. - Seated Cervical Retraction  - 10 reps - Seated Scapular Retraction - 10 reps - Seated Shoulder Rolls  -RETRO - 10 reps  PATIENT EDUCATION: Education details: Standing shoulder ext w/ yellow  Person educated: Patient Education method: Explanation, Demonstration, Verbal cues, and Handouts Education comprehension: verbalized understanding and returned demonstration   HOME EXERCISE PROGRAM: Access Code: [email or web address]  ASSESSMENT:  CLINICAL IMPRESSION: Pt responded well to treatment. She had no issues with BP, dizziness, or lightheadedness this session, thus continued with exercises. Provided cues to promote scapular retraction and depression to allow for proper alignment. Advanced through scap stab and postural strengthening with no issue. Added standing shoulder extension to HEP. No issues post session.   OBJECTIVE IMPAIRMENTS decreased activity tolerance, decreased ROM, decreased strength, increased fascial restrictions, impaired perceived functional ability, increased muscle spasms, impaired sensation, impaired UE functional use, postural dysfunction, and pain.   ACTIVITY LIMITATIONS carrying, lifting, bending, sleeping, bed mobility, dressing, reach over head, and hygiene/grooming  PARTICIPATION LIMITATIONS: cleaning and laundry  PERSONAL FACTORS Age and 3+ comorbidities: h/o breast cancer, CHF, diverticulitis, hearing loss, neuropathy of feet due to chemotherapy, R hip OA, HTN, bil hand OA  are also affecting patient's functional outcome.   REHAB POTENTIAL: Good  CLINICAL DECISION MAKING: Evolving/moderate complexity  EVALUATION COMPLEXITY: Moderate   GOALS: Goals reviewed with patient? Yes  SHORT TERM GOALS: Target date: 06/25/2022   Patient will be independent with initial HEP.  Baseline: given Goal status: MET  06/19/22 - reports good compliance and performing 2-3x daily.    LONG TERM GOALS: Target date: 07/23/2022   Patient will be independent with advanced/ongoing HEP to improve outcomes and carryover.  Baseline: needs progression Goal status: IN PROGRESS  2.  Patient will report 75% improvement in bil shoulder pain to improve QOL.  Baseline: 6-9/10 Goal status: IN PROGRESS  3.  Patient to demonstrate improved upright posture with posterior shoulder girdle engaged to promote improved glenohumeral joint mobility. Baseline: forward head posture Goal status: IN PROGRESS 06/19/2022 - noted improved upright posture without cuing today.  Patient much more aware of posture.    4.  Patient to improve bil shoulder AROM to [facility] without pain provocation to allow for increased ease of ADLs.  Baseline: see objective,  AROM limited by pain Goal status: IN PROGRESS  5.  Patient will demonstrate improved functional UE strength as demonstrated by 5/5 bil shoulder strength. Baseline: 4/5 bil limited by pain Goal status: IN PROGRESS  6  Patient will report <40% impairment on quickDash  to demonstrate improved functional ability.  Baseline: 56.8% impairment.  Goal status: IN PROGRESS  7.  Patient will be able to read without numbness/tingling in hands.    Baseline: increases, difficulty holding hardback books Goal status: IN PROGRESS   8. Patient  will report decreased bil shoulder pain in mornings to be able to use arms to get out of bed.  Baseline: has to swing legs out, cant use arms Goal status: IN PROGRESS    PLAN: PT FREQUENCY: 1-2x/week  PT DURATION: 6 weeks  PLANNED INTERVENTIONS: Therapeutic exercises, Therapeutic activity, Neuromuscular re-education, Balance training, Gait training, Patient/Family education, Self Care, Joint mobilization, Dry Needling, Electrical stimulation, Spinal mobilization, Cryotherapy, Moist heat, Traction, Ultrasound, Ionotophoresis 79m/ml Dexamethasone, Manual  therapy, and Re-evaluation  PLAN FOR NEXT SESSION: postural strengthening, neck ROM and stretching, manual therapy to decrease tightness cervical spine, modalities PRN, may trial traction. Check blood pressures, consider orthostatics    BClarene Essex PTA 06/27/2022, 11:53 AM

## 2022-06-30 ENCOUNTER — Ambulatory Visit: Payer: Medicare Other

## 2022-06-30 DIAGNOSIS — M25511 Pain in right shoulder: Secondary | ICD-10-CM | POA: Diagnosis not present

## 2022-06-30 DIAGNOSIS — M6281 Muscle weakness (generalized): Secondary | ICD-10-CM | POA: Diagnosis not present

## 2022-06-30 DIAGNOSIS — M25512 Pain in left shoulder: Secondary | ICD-10-CM | POA: Diagnosis not present

## 2022-06-30 DIAGNOSIS — R293 Abnormal posture: Secondary | ICD-10-CM

## 2022-06-30 NOTE — Therapy (Signed)
OUTPATIENT PHYSICAL THERAPY TREATMENT   Patient Name: Carla Bennett MRN: [id number] DOB:[birth year], 87 y.o., female Today's Date: 06/30/2022   PT End of Session - 06/30/22 1407     Visit Number 6    Number of Visits 12    Date for PT Re-Evaluation 07/23/22    Authorization Type Medicare + [employer] supplement    Progress Note Due on Visit 10    PT Start Time 1400    PT Stop Time 1451    PT Time Calculation (min) 51 min    Activity Tolerance Patient tolerated treatment well    Behavior During Therapy WFL for tasks assessed/performed                 Past Medical History:  Diagnosis Date   Allergy    Aortic regurgitation    Arthritis    hands, neck   Basal cell carcinoma    Benign neoplasm of colon    Breast cancer of upper-outer quadrant of left female breast ([place]) 02/23/2015   4'16-left breast cancer-surgery, chemo, radiation-[name] follows every 3 months   Cataract    beginning stages-monitoring by MD   Cholelithiasis    resolved with surgery   Chronic systolic CHF (congestive heart failure) ([place]) 02/26/2016   A.  Echo 02/21/16:  Mild LVH, EF 20-25%, diff HK, Gr 3 DD, mod AI, mod to severe MR, mod LAE, mod redcued RVSF, mild RAE, mod TR, PASP 53 mmHg, small pericardial effusion, mod L pleural effusion  //  B. Echo 6/17:  Mild LVH EF 30-35%, diffuse HK, grade 1 diastolic dysfunction, trivial AI, MAC, mild LAE, normal RVSF, trivial pericardial effusion     Cystocele    Diverticulitis    Diverticulosis of colon (without mention of hemorrhage)    Gout    resolved -   Hearing loss    some hearing loss in right ear- no hearing aids   Heart murmur    Hematuria    negative urology work up   History of blood transfusion 1971   x 1 with D&C -    Hx of abnormal cervical Pap smear    LEEP-CIN I, negative margins and ECC   Hypercalcemia    Hypertension    Mitral regurgitation    Neuromuscular disorder (HCC)    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   OA (osteoarthritis)  of hip    right   Other issue of medical certificates    uterine myomata   Parathyroid abnormality ([place])    being followed Dr. Barbette Hair   Radiation 10/01/15-11/16/15   left breast axillary and supraclavicular reigion 45 gray, left breast 50.4 gray, lumpectomy cavity boost 10 gray   Renal disease 3/16   Stage 3 kidney disease-seeing Dr Buddy Duty and Dr Marval Regal   Uterine prolaps    resolved after surgery   Vitamin D deficiency    Past Surgical History:  Procedure Laterality Date   BREAST LUMPECTOMY Left 08/2015   CHOLECYSTECTOMY     COLONOSCOPY  01/18/2013   [name]   COLONOSCOPY N/A 06/26/2016   Procedure: COLONOSCOPY;  Surgeon: Irene Shipper, MD;  Location: [facility];  Service: Endoscopy;  Laterality: N/A;   CYSTOCELE REPAIR  08/2010   Rectocele, vault prolapse   DILATION AND CURETTAGE OF UTERUS     x2   PORT-A-CATH REMOVAL Right 08/23/2015   Procedure: REMOVAL PORT-A-CATH;  Surgeon: Erroll Luna, MD;  Location: [facility];  Service: General;  Laterality: Right;  PORTACATH PLACEMENT Right 03/20/2015   Procedure: INSERTION PORT-A-CATH;  Surgeon: Erroll Luna, MD;  Location: [place];  Service: General;  Laterality: Right;   RADIOACTIVE SEED GUIDED PARTIAL MASTECTOMY WITH AXILLARY SENTINEL LYMPH NODE BIOPSY Left 08/23/2015   Procedure: LEFT BREAST PARTIAL MASTECTOMY WITH SENTINEL LYMPH NODE MAPPING;  Surgeon: Erroll Luna, MD;  Location: [facility];  Service: General;  Laterality: Left;   TONSILLECTOMY AND ADENOIDECTOMY     TOTAL VAGINAL HYSTERECTOMY     secondary to prolapse, ovaries not removed   Patient Active Problem List   Diagnosis Date Noted   Mixed hyperlipidemia 08/21/2021   Diverticulosis of colon without hemorrhage    Chronic systolic CHF (congestive heart failure) ([place]) 02/26/2016   Dilated cardiomyopathy ([place]) 02/26/2016   CKD (chronic kidney disease) 02/26/2016   Essential hypertension 07/26/2015   Family history of malignant neoplasm  of gastrointestinal tract 03/08/2015   Breast cancer of upper-outer quadrant of left female breast ([place]) 02/23/2015   Hematochezia 01/06/2013    PCP: Donald Prose, MD  REFERRING PROVIDER: Olen Cordial, PA  REFERRING DIAG: M25.511 (ICD-10-CM) -Bilateral shoulder pain  THERAPY DIAG:  Acute pain of left shoulder  Acute pain of right shoulder  Abnormal posture  Muscle weakness (generalized)  Rationale for Evaluation and Treatment Rehabilitation  ONSET DATE: 5 weeks ago  SUBJECTIVE:                                                                                                                                                                                      SUBJECTIVE STATEMENT: Pt reports this morning she went w/o taking her tylenol but did have to take one before coming here. Normally takes two.   PERTINENT HISTORY: PMH: basal cell carcinoma, breast cancer, CHF, diverticulitis, hearing loss, neuropathy of feet due to chemotherapy, R hip OA, HTN, bil hand OA  PAIN:  Are you having pain? Yes: NPRS scale: 4/10 Pain location: more in B arms today not really in the shoulders  Pain description: very sore  Aggravating factors: mornings, using too many pillows   Relieving factors: arthritis medicines and asper cream   PRECAUTIONS: Other: history of L sided breast cancer, keep resistance low.    WEIGHT BEARING RESTRICTIONS No  FALLS:  Has patient fallen in last 6 months? No  LIVING ENVIRONMENT: Lives with: lives alone Lives in: House/apartment Stairs: No Has following equipment at home: Grab bars  OCCUPATION: Retired   PLOF: Independent  PATIENT GOALS "Be able to clean house like I used too pain free"  OBJECTIVE:   DIAGNOSTIC FINDINGS:  Cervical MRI 12/18/2020 IMPRESSION: 1. No acute fracture or acute intracranial findings. 2. Right facial  soft tissue swelling overlying the maxilla. Probable soft tissue swelling along the chin eccentric to the right. 3.  Periventricular white matter and corona radiata hypodensities favor chronic ischemic microvascular white matter disease. 4. Cervical spondylosis and degenerative disc disease causing multilevel osseous foraminal impingement. 5. Left common carotid atherosclerotic calcification. 6. Chronic paranasal sinusitis. Acute on chronic bilateral maxillary sinusitis.  PATIENT SURVEYS:  Quick Dash 56.8% disability   COGNITION:  Overall cognitive status: Within functional limits for tasks assessed     SENSATION: Slightly diminished L thumb  POSTURE: Forward head posture, rounded shoulder  CERVICAL ROM:  AROM eval  Cervical Flexion 40  Cervical Extension 45  Cervical Rotation to Left 70  Cervical Rotation to Right 70  Left Sidebend 40  Right Sidebend 30  No pain just tightness with movement.   UPPER EXTREMITY ROM:   full PROM bil,  AROM limited by pain.   Active ROM Right*  eval Left eval  Shoulder flexion 120 125*  Shoulder extension 75 75  Shoulder abduction 120 125  Shoulder internal rotation  Functional T12 Functional T12  Shoulder external rotation Functional T2 Functional T2  Elbow flexion    Elbow extension    Wrist flexion    Wrist extension    (Blank rows = not tested)  UPPER EXTREMITY MMT:  MMT Right eval Left eval  Shoulder flexion 4* 4*  Shoulder extension    Shoulder abduction 4* 4*  Shoulder adduction    Shoulder internal rotation 5 5  Shoulder external rotation 5 5  Elbow flexion 5 5  Elbow extension 5 5  Wrist flexion    Wrist extension 5 5  Wrist ulnar deviation    Wrist radial deviation    Wrist pronation    Wrist supination    Grip strength  good good  (Blank rows = not tested)  *pain  SHOULDER SPECIAL TESTS:  Painful arc bil  Negative spurling  JOINT MOBILITY TESTING:  Full PROM, no capsular tightness  PALPATION:  No pain with palpation bil shoulders.  Noted increased cervical lordosis and hypomobility, tightness anterior scalenes,  levator, upper trap, and cervical paraspinals.     TODAY'S TREATMENT:  06/30/22 Therapeutic Exercise: UBE L1 3 min fwd/ 3 min back Standing prayer shld flexion x 10 on wall Standing trunk extension at wall x 10 Standing ER bil yellow TB 2x10 Standing horiz ABD  bil yellow TB x 10  Doorway pec low stretch 3x15" BATCA row 10# 2x15  Manual Therapy: STM to bil UT, LS, scalenes  Moist Heat x 10 min post session to cervical spine   06/27/22 Therapeutic Exercise: UBE L1 3 min fwd/3 min back Seated levator stretch 3x15" bil Seated thoracic ext arms crossed x 10 Standing shld ext x 10 yellow TB  Standing ER with yellow TB against doorframe x 10 Standing trunk rotation x 10 bil Standing thoracic ext x 10   Manual Therapy: STM to bil rhomboids, UT, LS - more tight UT on L 06/24/22  TherEx:  -UBE L2 x3 min forward/3 min backward   -Chin tucks x10 with 3 second holds -Chin tucks with rotation x10 B 3 second holds -Chin tucks with lateral flexion x10 3 second holds  -Scapular retractions yellow TB x15 3 second holds  -Shoulder extensions x10 yellow TB  - Scapular retraction with B ER yellow TB x10 2 second holds  - 3 way reaches on wall with yellow TB periscap stability 1x5 B  - stability flexion x10 with yellow TB  cues for periscap activation - 3D thoracic excursions x10 all directions   101/80 supine   PATIENT EDUCATION: Education details: Standing shoulder ext w/ yellow  Person educated: Patient Education method: Explanation, Demonstration, Verbal cues, and Handouts Education comprehension: verbalized understanding and returned demonstration   HOME EXERCISE PROGRAM: Access Code: [email or web address]  ASSESSMENT:  CLINICAL IMPRESSION: Continued with progressing postural exercises. Pt required cues to keep neck extended during horiz ABD and ER for upright posture. Continued with MT to address restrictions along neck musculature, good response from patient. Ended session with MHP to  allow for further muscle relaxation. Pt would continue to benefit from progression of postural exercises.   OBJECTIVE IMPAIRMENTS decreased activity tolerance, decreased ROM, decreased strength, increased fascial restrictions, impaired perceived functional ability, increased muscle spasms, impaired sensation, impaired UE functional use, postural dysfunction, and pain.   ACTIVITY LIMITATIONS carrying, lifting, bending, sleeping, bed mobility, dressing, reach over head, and hygiene/grooming  PARTICIPATION LIMITATIONS: cleaning and laundry  PERSONAL FACTORS Age and 3+ comorbidities: h/o breast cancer, CHF, diverticulitis, hearing loss, neuropathy of feet due to chemotherapy, R hip OA, HTN, bil hand OA  are also affecting patient's functional outcome.   REHAB POTENTIAL: Good  CLINICAL DECISION MAKING: Evolving/moderate complexity  EVALUATION COMPLEXITY: Moderate   GOALS: Goals reviewed with patient? Yes  SHORT TERM GOALS: Target date: 06/25/2022   Patient will be independent with initial HEP.  Baseline: given Goal status: MET 06/19/22 - reports good compliance and performing 2-3x daily.    LONG TERM GOALS: Target date: 07/23/2022   Patient will be independent with advanced/ongoing HEP to improve outcomes and carryover.  Baseline: needs progression Goal status: IN PROGRESS  2.  Patient will report 75% improvement in bil shoulder pain to improve QOL.  Baseline: 6-9/10 Goal status: IN PROGRESS  3.  Patient to demonstrate improved upright posture with posterior shoulder girdle engaged to promote improved glenohumeral joint mobility. Baseline: forward head posture Goal status: IN PROGRESS 06/19/2022 - noted improved upright posture without cuing today.  Patient much more aware of posture.    4.  Patient to improve bil shoulder AROM to [facility] without pain provocation to allow for increased ease of ADLs.  Baseline: see objective,  AROM limited by pain Goal status: IN PROGRESS  5.  Patient  will demonstrate improved functional UE strength as demonstrated by 5/5 bil shoulder strength. Baseline: 4/5 bil limited by pain Goal status: IN PROGRESS  6  Patient will report <40% impairment on quickDash  to demonstrate improved functional ability.  Baseline: 56.8% impairment.  Goal status: IN PROGRESS  7.  Patient will be able to read without numbness/tingling in hands.    Baseline: increases, difficulty holding hardback books Goal status: IN PROGRESS   8. Patient will report decreased bil shoulder pain in mornings to be able to use arms to get out of bed.  Baseline: has to swing legs out, cant use arms Goal status: IN PROGRESS    PLAN: PT FREQUENCY: 1-2x/week  PT DURATION: 6 weeks  PLANNED INTERVENTIONS: Therapeutic exercises, Therapeutic activity, Neuromuscular re-education, Balance training, Gait training, Patient/Family education, Self Care, Joint mobilization, Dry Needling, Electrical stimulation, Spinal mobilization, Cryotherapy, Moist heat, Traction, Ultrasound, Ionotophoresis 23m/ml Dexamethasone, Manual therapy, and Re-evaluation  PLAN FOR NEXT SESSION: postural strengthening, neck ROM and stretching, manual therapy to decrease tightness cervical spine, modalities PRN, may trial traction. Check blood pressures, consider orthostatics    BClarene Essex PTA 06/30/2022, 3:23 PM

## 2022-07-02 ENCOUNTER — Ambulatory Visit: Payer: Medicare Other | Admitting: Physical Therapy

## 2022-07-02 ENCOUNTER — Encounter: Payer: Self-pay | Admitting: Physical Therapy

## 2022-07-02 DIAGNOSIS — M25512 Pain in left shoulder: Secondary | ICD-10-CM | POA: Diagnosis not present

## 2022-07-02 DIAGNOSIS — R293 Abnormal posture: Secondary | ICD-10-CM | POA: Diagnosis not present

## 2022-07-02 DIAGNOSIS — M6281 Muscle weakness (generalized): Secondary | ICD-10-CM | POA: Diagnosis not present

## 2022-07-02 DIAGNOSIS — M25511 Pain in right shoulder: Secondary | ICD-10-CM

## 2022-07-02 NOTE — Therapy (Signed)
OUTPATIENT PHYSICAL THERAPY TREATMENT   Patient Name: Carla Bennett MRN: [id number] DOB:05/27/1935, 87 y.o., female Today's Date: 07/02/2022   PT End of Session - 07/02/22 1404     Visit Number 7    Number of Visits 12    Date for PT Re-Evaluation 07/23/22    Authorization Type Medicare + [employer] supplement    Progress Note Due on Visit 10    PT Start Time 1405    PT Stop Time 1447    PT Time Calculation (min) 42 min    Activity Tolerance Patient tolerated treatment well    Behavior During Therapy WFL for tasks assessed/performed                 Past Medical History:  Diagnosis Date   Allergy    Aortic regurgitation    Arthritis    hands, neck   Basal cell carcinoma    Benign neoplasm of colon    Breast cancer of upper-outer quadrant of left female breast ([place]) 02/23/2015   4'16-left breast cancer-surgery, chemo, radiation-[name] follows every 3 months   Cataract    beginning stages-monitoring by MD   Cholelithiasis    resolved with surgery   Chronic systolic CHF (congestive heart failure) ([place]) 02/26/2016   A.  Echo 02/21/16:  Mild LVH, EF 20-25%, diff HK, Gr 3 DD, mod AI, mod to severe MR, mod LAE, mod redcued RVSF, mild RAE, mod TR, PASP 53 mmHg, small pericardial effusion, mod L pleural effusion  //  B. Echo 6/17:  Mild LVH EF 30-35%, diffuse HK, grade 1 diastolic dysfunction, trivial AI, MAC, mild LAE, normal RVSF, trivial pericardial effusion     Cystocele    Diverticulitis    Diverticulosis of colon (without mention of hemorrhage)    Gout    resolved -   Hearing loss    some hearing loss in right ear- no hearing aids   Heart murmur    Hematuria    negative urology work up   History of blood transfusion 1971   x 1 with D&C -    Hx of abnormal cervical Pap smear    LEEP-CIN I, negative margins and ECC   Hypercalcemia    Hypertension    Mitral regurgitation    Neuromuscular disorder (HCC)    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   OA (osteoarthritis)  of hip    right   Other issue of medical certificates    uterine myomata   Parathyroid abnormality ([place])    being followed Dr. Barbette Hair   Radiation 10/01/15-11/16/15   left breast axillary and supraclavicular reigion 45 gray, left breast 50.4 gray, lumpectomy cavity boost 10 gray   Renal disease 3/16   Stage 3 kidney disease-seeing Dr Buddy Duty and Dr Marval Regal   Uterine prolaps    resolved after surgery   Vitamin D deficiency    Past Surgical History:  Procedure Laterality Date   BREAST LUMPECTOMY Left 08/2015   CHOLECYSTECTOMY     COLONOSCOPY  01/18/2013   [name]   COLONOSCOPY N/A 06/26/2016   Procedure: COLONOSCOPY;  Surgeon: Irene Shipper, MD;  Location: [facility];  Service: Endoscopy;  Laterality: N/A;   CYSTOCELE REPAIR  08/2010   Rectocele, vault prolapse   DILATION AND CURETTAGE OF UTERUS     x2   PORT-A-CATH REMOVAL Right 08/23/2015   Procedure: REMOVAL PORT-A-CATH;  Surgeon: Erroll Luna, MD;  Location: [facility];  Service: General;  Laterality: Right;  PORTACATH PLACEMENT Right 03/20/2015   Procedure: INSERTION PORT-A-CATH;  Surgeon: Erroll Luna, MD;  Location: [facility];  Service: General;  Laterality: Right;   RADIOACTIVE SEED GUIDED PARTIAL MASTECTOMY WITH AXILLARY SENTINEL LYMPH NODE BIOPSY Left 08/23/2015   Procedure: LEFT BREAST PARTIAL MASTECTOMY WITH SENTINEL LYMPH NODE MAPPING;  Surgeon: Erroll Luna, MD;  Location: [facility];  Service: General;  Laterality: Left;   TONSILLECTOMY AND ADENOIDECTOMY     TOTAL VAGINAL HYSTERECTOMY     secondary to prolapse, ovaries not removed   Patient Active Problem List   Diagnosis Date Noted   Mixed hyperlipidemia 08/21/2021   Diverticulosis of colon without hemorrhage    Chronic systolic CHF (congestive heart failure) ([place]) 02/26/2016   Dilated cardiomyopathy ([place]) 02/26/2016   CKD (chronic kidney disease) 02/26/2016   Essential hypertension 07/26/2015   Family history of malignant neoplasm  of gastrointestinal tract 03/08/2015   Breast cancer of upper-outer quadrant of left female breast ([place]) 02/23/2015   Hematochezia 01/06/2013    PCP: Donald Prose, MD  REFERRING PROVIDER: Olen Cordial, PA  REFERRING DIAG: M25.511 (ICD-10-CM) -Bilateral shoulder pain  THERAPY DIAG:  Acute pain of left shoulder  Acute pain of right shoulder  Abnormal posture  Muscle weakness (generalized)  Rationale for Evaluation and Treatment Rehabilitation  ONSET DATE: 5 weeks ago  SUBJECTIVE:                                                                                                                                                                                      SUBJECTIVE STATEMENT: Carla Bennett reports her shoulders are doing well, "if only I could get the right hand to quit being numb I can do more."  Sleep is great since she got her new pillow- medium firmness.    PERTINENT HISTORY: PMH: basal cell carcinoma, breast cancer, CHF, diverticulitis, hearing loss, neuropathy of feet due to chemotherapy, R hip OA, HTN, bil hand OA  PAIN:  Are you having pain? Yes: NPRS scale: 1-2/10 Pain location: bil arms, R hand numbness Pain description: very sore  Aggravating factors: mornings, using too many pillows   Relieving factors: arthritis medicines and asper cream   PRECAUTIONS: Other: history of L sided breast cancer, keep resistance low.   Reporting history of orthostatic hypotension.   WEIGHT BEARING RESTRICTIONS No  FALLS:  Has patient fallen in last 6 months? No  LIVING ENVIRONMENT: Lives with: lives alone Lives in: House/apartment Stairs: No Has following equipment at home: Grab bars  OCCUPATION: Retired   PLOF: Independent  PATIENT GOALS "Be able to clean house like I used too pain free"  OBJECTIVE:   DIAGNOSTIC FINDINGS:  Cervical  MRI 12/18/2020 IMPRESSION: 1. No acute fracture or acute intracranial findings. 2. Right facial soft tissue swelling overlying the  maxilla. Probable soft tissue swelling along the chin eccentric to the right. 3. Periventricular white matter and corona radiata hypodensities favor chronic ischemic microvascular white matter disease. 4. Cervical spondylosis and degenerative disc disease causing multilevel osseous foraminal impingement. 5. Left common carotid atherosclerotic calcification. 6. Chronic paranasal sinusitis. Acute on chronic bilateral maxillary sinusitis.  PATIENT SURVEYS:  Quick Dash 56.8% disability   COGNITION:  Overall cognitive status: Within functional limits for tasks assessed     SENSATION: Slightly diminished L thumb  POSTURE: Forward head posture, rounded shoulder  CERVICAL ROM:  AROM eval  Cervical Flexion 40  Cervical Extension 45  Cervical Rotation to Left 70  Cervical Rotation to Right 70  Left Sidebend 40  Right Sidebend 30  No pain just tightness with movement.   UPPER EXTREMITY ROM:   full PROM bil,  AROM limited by pain.   Active ROM Right*  eval Left eval  Shoulder flexion 120 125*  Shoulder extension 75 75  Shoulder abduction 120 125  Shoulder internal rotation  Functional T12 Functional T12  Shoulder external rotation Functional T2 Functional T2  Elbow flexion    Elbow extension    Wrist flexion    Wrist extension    (Blank rows = not tested)  UPPER EXTREMITY MMT:  MMT Right eval Left eval  Shoulder flexion 4* 4*  Shoulder extension    Shoulder abduction 4* 4*  Shoulder adduction    Shoulder internal rotation 5 5  Shoulder external rotation 5 5  Elbow flexion 5 5  Elbow extension 5 5  Wrist flexion    Wrist extension 5 5  Wrist ulnar deviation    Wrist radial deviation    Wrist pronation    Wrist supination    Grip strength  good good  (Blank rows = not tested)  *pain  SHOULDER SPECIAL TESTS:  Painful arc bil  Negative spurling  JOINT MOBILITY TESTING:  Full PROM, no capsular tightness  PALPATION:  No pain with palpation bil shoulders.   Noted increased cervical lordosis and hypomobility, tightness anterior scalenes, levator, upper trap, and cervical paraspinals.     TODAY'S TREATMENT:  07/02/22 Therapeutic Exercise: to improve strength and mobility.  Demo, verbal and tactile cues throughout for technique. UBE L1 3 min fwd/3 min back Standing rows RTB 2 x 10  Standing shoulder extension RTB 2 x 10  Standing ER bil RTB 2 x 10  Standing ulnar nerve glides and flossing.  Manual Therapy: to decrease muscle spasm and pain and improve mobility.  In supine, STM/TPR to R UT, levator scapulea, bil cervical paraspinals, PA/R UPA mobs cervical spine, NAGs into rotation.    06/30/22 Therapeutic Exercise: UBE L1 3 min fwd/ 3 min back Standing prayer shld flexion x 10 on wall Standing trunk extension at wall x 10 Standing ER bil yellow TB 2x10 Standing horiz ABD  bil yellow TB x 10  Doorway pec low stretch 3x15" BATCA row 10# 2x15  Manual Therapy: STM to bil UT, LS, scalenes  Moist Heat x 10 min post session to cervical spine   06/27/22 Therapeutic Exercise: UBE L1 3 min fwd/3 min back Seated levator stretch 3x15" bil Seated thoracic ext arms crossed x 10 Standing shld ext x 10 yellow TB  Standing ER with yellow TB against doorframe x 10 Standing trunk rotation x 10 bil Standing thoracic ext x 10  Manual Therapy: STM to bil rhomboids, UT, LS - more tight UT on L   PATIENT EDUCATION: Education details: HEP update 9/13, issued RTB, also given handout on orthostatic hypotension.   Person educated: Patient Education method: Explanation, Demonstration, Verbal cues, and Handouts Education comprehension: verbalized understanding and returned demonstration   HOME EXERCISE PROGRAM: Access Code: [email or web address]  ASSESSMENT:  CLINICAL IMPRESSION: Carla Bennett is reporting significant improvement overall in neck pain/bil shoulder pain especially with sleep.  Today progressed postural strengthening advancing to red theraband.   Also reviewed ulnar nerve glides.  After exercises she requested to sit down as feeling a little light headed, reported frequent eposides of orthostasis, so given handout.  Once seated she reported resolution of symptoms.  Still noted tightness in cervical paraspinals and UT/LS on Right side, reported decreased pain and tightness following manual therapy.  Trystin L [place] continues to demonstrate potential for improvement and would benefit from continued skilled therapy to address impairments.     OBJECTIVE IMPAIRMENTS decreased activity tolerance, decreased ROM, decreased strength, increased fascial restrictions, impaired perceived functional ability, increased muscle spasms, impaired sensation, impaired UE functional use, postural dysfunction, and pain.   ACTIVITY LIMITATIONS carrying, lifting, bending, sleeping, bed mobility, dressing, reach over head, and hygiene/grooming  PARTICIPATION LIMITATIONS: cleaning and laundry  PERSONAL FACTORS Age and 3+ comorbidities: h/o breast cancer, CHF, diverticulitis, hearing loss, neuropathy of feet due to chemotherapy, R hip OA, HTN, bil hand OA  are also affecting patient's functional outcome.   REHAB POTENTIAL: Good  CLINICAL DECISION MAKING: Evolving/moderate complexity  EVALUATION COMPLEXITY: Moderate   GOALS: Goals reviewed with patient? Yes  SHORT TERM GOALS: Target date: 06/25/2022   Patient will be independent with initial HEP.  Baseline: given Goal status: MET 06/19/22 - reports good compliance and performing 2-3x daily.    LONG TERM GOALS: Target date: 07/23/2022   Patient will be independent with advanced/ongoing HEP to improve outcomes and carryover.  Baseline: needs progression Goal status: IN PROGRESS  2.  Patient will report 75% improvement in bil shoulder pain to improve QOL.  Baseline: 6-9/10 Goal status: IN PROGRESS  3.  Patient to demonstrate improved upright posture with posterior shoulder girdle engaged to promote  improved glenohumeral joint mobility. Baseline: forward head posture Goal status: IN PROGRESS 06/19/2022 - noted improved upright posture without cuing today.  Patient much more aware of posture.    4.  Patient to improve bil shoulder AROM to [facility] without pain provocation to allow for increased ease of ADLs.  Baseline: see objective,  AROM limited by pain Goal status: IN PROGRESS  5.  Patient will demonstrate improved functional UE strength as demonstrated by 5/5 bil shoulder strength. Baseline: 4/5 bil limited by pain Goal status: IN PROGRESS  6  Patient will report <40% impairment on quickDash  to demonstrate improved functional ability.  Baseline: 56.8% impairment.  Goal status: IN PROGRESS  7.  Patient will be able to read without numbness/tingling in hands.    Baseline: increases, difficulty holding hardback books Goal status: IN PROGRESS 07/02/22- improving, places pillow in lap to improve posture and decreased numbness in hands  8. Patient will report decreased bil shoulder pain in mornings to be able to use arms to get out of bed.  Baseline: has to swing legs out, cant use arms Goal status: IN PROGRESS 07/02/2022 - significant improvement, "just a teeny struggle now"    PLAN: PT FREQUENCY: 1-2x/week  PT DURATION: 6 weeks  PLANNED INTERVENTIONS: Therapeutic exercises, Therapeutic activity, Neuromuscular  re-education, Balance training, Gait training, Patient/Family education, Self Care, Joint mobilization, Dry Needling, Electrical stimulation, Spinal mobilization, Cryotherapy, Moist heat, Traction, Ultrasound, Ionotophoresis 70m/ml Dexamethasone, Manual therapy, and Re-evaluation  PLAN FOR NEXT SESSION: postural strengthening, neck ROM and stretching, manual therapy to decrease tightness cervical spine, modalities PRN, may trial traction.   ERennie Natter PT, DPT  07/02/2022, 3:48 PM

## 2022-07-07 ENCOUNTER — Ambulatory Visit: Payer: Medicare Other

## 2022-07-07 DIAGNOSIS — M25511 Pain in right shoulder: Secondary | ICD-10-CM

## 2022-07-07 DIAGNOSIS — M6281 Muscle weakness (generalized): Secondary | ICD-10-CM | POA: Diagnosis not present

## 2022-07-07 DIAGNOSIS — M25512 Pain in left shoulder: Secondary | ICD-10-CM

## 2022-07-07 DIAGNOSIS — R293 Abnormal posture: Secondary | ICD-10-CM | POA: Diagnosis not present

## 2022-07-07 NOTE — Therapy (Signed)
OUTPATIENT PHYSICAL THERAPY TREATMENT   Patient Name: Tomicka Lover Spahr MRN: [id number] DOB:1935-03-19, 87 y.o., female Today's Date: 07/07/2022   PT End of Session - 07/07/22 1450     Visit Number 8    Number of Visits 12    Date for PT Re-Evaluation 07/23/22    Authorization Type Medicare + [employer] supplement    Progress Note Due on Visit 10    PT Start Time 1402    PT Stop Time 1443    PT Time Calculation (min) 41 min    Activity Tolerance Patient tolerated treatment well    Behavior During Therapy WFL for tasks assessed/performed                  Past Medical History:  Diagnosis Date   Allergy    Aortic regurgitation    Arthritis    hands, neck   Basal cell carcinoma    Benign neoplasm of colon    Breast cancer of upper-outer quadrant of left female breast ([place]) 02/23/2015   4'16-left breast cancer-surgery, chemo, radiation-[name] follows every 3 months   Cataract    beginning stages-monitoring by MD   Cholelithiasis    resolved with surgery   Chronic systolic CHF (congestive heart failure) ([name]) 02/26/2016   A.  Echo 02/21/16:  Mild LVH, EF 20-25%, diff HK, Gr 3 DD, mod AI, mod to severe MR, mod LAE, mod redcued RVSF, mild RAE, mod TR, PASP 53 mmHg, small pericardial effusion, mod L pleural effusion  //  B. Echo 6/17:  Mild LVH EF 30-35%, diffuse HK, grade 1 diastolic dysfunction, trivial AI, MAC, mild LAE, normal RVSF, trivial pericardial effusion     Cystocele    Diverticulitis    Diverticulosis of colon (without mention of hemorrhage)    Gout    resolved -   Hearing loss    some hearing loss in right ear- no hearing aids   Heart murmur    Hematuria    negative urology work up   History of blood transfusion 1971   x 1 with D&C -    Hx of abnormal cervical Pap smear    LEEP-CIN I, negative margins and ECC   Hypercalcemia    Hypertension    Mitral regurgitation    Neuromuscular disorder (HCC)    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   OA  (osteoarthritis) of hip    right   Other issue of medical certificates    uterine myomata   Parathyroid abnormality ([place])    being followed Dr. Barbette Hair   Radiation 10/01/15-11/16/15   left breast axillary and supraclavicular reigion 45 gray, left breast 50.4 gray, lumpectomy cavity boost 10 gray   Renal disease 3/16   Stage 3 kidney disease-seeing Dr Buddy Duty and Dr Marval Regal   Uterine prolaps    resolved after surgery   Vitamin D deficiency    Past Surgical History:  Procedure Laterality Date   BREAST LUMPECTOMY Left 08/2015   CHOLECYSTECTOMY     COLONOSCOPY  01/18/2013   [name]   COLONOSCOPY N/A 06/26/2016   Procedure: COLONOSCOPY;  Surgeon: Irene Shipper, MD;  Location: [facility];  Service: Endoscopy;  Laterality: N/A;   CYSTOCELE REPAIR  08/2010   Rectocele, vault prolapse   DILATION AND CURETTAGE OF UTERUS     x2   PORT-A-CATH REMOVAL Right 08/23/2015   Procedure: REMOVAL PORT-A-CATH;  Surgeon: Erroll Luna, MD;  Location: [place] [facility];  Service: General;  Laterality: Right;  PORTACATH PLACEMENT Right 03/20/2015   Procedure: INSERTION PORT-A-CATH;  Surgeon: Erroll Luna, MD;  Location: [place];  Service: General;  Laterality: Right;   RADIOACTIVE SEED GUIDED PARTIAL MASTECTOMY WITH AXILLARY SENTINEL LYMPH NODE BIOPSY Left 08/23/2015   Procedure: LEFT BREAST PARTIAL MASTECTOMY WITH SENTINEL LYMPH NODE MAPPING;  Surgeon: Erroll Luna, MD;  Location: [place];  Service: General;  Laterality: Left;   TONSILLECTOMY AND ADENOIDECTOMY     TOTAL VAGINAL HYSTERECTOMY     secondary to prolapse, ovaries not removed   Patient Active Problem List   Diagnosis Date Noted   Mixed hyperlipidemia 08/21/2021   Diverticulosis of colon without hemorrhage    Chronic systolic CHF (congestive heart failure) ([place]) 02/26/2016   Dilated cardiomyopathy ([place]) 02/26/2016   CKD (chronic kidney disease) 02/26/2016   Essential hypertension 07/26/2015   Family history of  malignant neoplasm of gastrointestinal tract 03/08/2015   Breast cancer of upper-outer quadrant of left female breast ([place]) 02/23/2015   Hematochezia 01/06/2013    PCP: Donald Prose, MD  REFERRING PROVIDER: Olen Cordial, PA  REFERRING DIAG: M25.511 (ICD-10-CM) -Bilateral shoulder pain  THERAPY DIAG:  Acute pain of left shoulder  Acute pain of right shoulder  Abnormal posture  Muscle weakness (generalized)  Rationale for Evaluation and Treatment Rehabilitation  ONSET DATE: 5 weeks ago  SUBJECTIVE:                                                                                                                                                                                      SUBJECTIVE STATEMENT: Pt reports that she was able to cook for guest over the weekend no increased pain.    PERTINENT HISTORY: PMH: basal cell carcinoma, breast cancer, CHF, diverticulitis, hearing loss, neuropathy of feet due to chemotherapy, R hip OA, HTN, bil hand OA  PAIN:  Are you having pain? Yes: NPRS scale: 2/10 Pain location: bil arms, R hand numbness Pain description: very sore  Aggravating factors: mornings, using too many pillows   Relieving factors: arthritis medicines and asper cream   PRECAUTIONS: Other: history of L sided breast cancer, keep resistance low.   Reporting history of orthostatic hypotension.   WEIGHT BEARING RESTRICTIONS No  FALLS:  Has patient fallen in last 6 months? No  LIVING ENVIRONMENT: Lives with: lives alone Lives in: House/apartment Stairs: No Has following equipment at home: Grab bars  OCCUPATION: Retired   PLOF: Independent  PATIENT GOALS "Be able to clean house like I used too pain free"  OBJECTIVE:   DIAGNOSTIC FINDINGS:  Cervical MRI 12/18/2020 IMPRESSION: 1. No acute fracture or acute intracranial findings. 2. Right facial soft tissue swelling overlying the  maxilla. Probable soft tissue swelling along the chin eccentric to the right. 3.  Periventricular white matter and corona radiata hypodensities favor chronic ischemic microvascular white matter disease. 4. Cervical spondylosis and degenerative disc disease causing multilevel osseous foraminal impingement. 5. Left common carotid atherosclerotic calcification. 6. Chronic paranasal sinusitis. Acute on chronic bilateral maxillary sinusitis.  PATIENT SURVEYS:  Quick Dash 56.8% disability   COGNITION:  Overall cognitive status: Within functional limits for tasks assessed     SENSATION: Slightly diminished L thumb  POSTURE: Forward head posture, rounded shoulder  CERVICAL ROM:  AROM eval  Cervical Flexion 40  Cervical Extension 45  Cervical Rotation to Left 70  Cervical Rotation to Right 70  Left Sidebend 40  Right Sidebend 30  No pain just tightness with movement.   UPPER EXTREMITY ROM:   full PROM bil,  AROM limited by pain.   Active ROM Right*  eval Left eval  Shoulder flexion 120 125*  Shoulder extension 75 75  Shoulder abduction 120 125  Shoulder internal rotation  Functional T12 Functional T12  Shoulder external rotation Functional T2 Functional T2  Elbow flexion    Elbow extension    Wrist flexion    Wrist extension    (Blank rows = not tested)  UPPER EXTREMITY MMT:  MMT Right eval Left eval  Shoulder flexion 4* 4*  Shoulder extension    Shoulder abduction 4* 4*  Shoulder adduction    Shoulder internal rotation 5 5  Shoulder external rotation 5 5  Elbow flexion 5 5  Elbow extension 5 5  Wrist flexion    Wrist extension 5 5  Wrist ulnar deviation    Wrist radial deviation    Wrist pronation    Wrist supination    Grip strength  good good  (Blank rows = not tested)  *pain  SHOULDER SPECIAL TESTS:  Painful arc bil  Negative spurling  JOINT MOBILITY TESTING:  Full PROM, no capsular tightness  PALPATION:  No pain with palpation bil shoulders.  Noted increased cervical lordosis and hypomobility, tightness anterior scalenes,  levator, upper trap, and cervical paraspinals.     TODAY'S TREATMENT:  07/07/22 TherEx: UBE L1.5 [id number] Standing ER red TB standing against doorframe x 15 Standing mid horiz ABD red TB x 15 Standing high row/lat pull red TB 2x15 Standing shoulder ext x 15 red TB Seated shld flex and scap x 10 1# bil Door way pec stretch 3x20" low Mid pec stretch at doorway 2x15" Seated thoracic ext with foam roll x10 3 sec hold  Manual Therapy: STM to R rhomboids, thoracic paraspinals  07/02/22 Therapeutic Exercise: to improve strength and mobility.  Demo, verbal and tactile cues throughout for technique. UBE L1 3 min fwd/3 min back Standing rows RTB 2 x 10  Standing shoulder extension RTB 2 x 10  Standing ER bil RTB 2 x 10  Standing ulnar nerve glides and flossing.  Manual Therapy: to decrease muscle spasm and pain and improve mobility.  In supine, STM/TPR to R UT, levator scapulea, bil cervical paraspinals, PA/R UPA mobs cervical spine, NAGs into rotation.    06/30/22 Therapeutic Exercise: UBE L1 3 min fwd/ 3 min back Standing prayer shld flexion x 10 on wall Standing trunk extension at wall x 10 Standing ER bil yellow TB 2x10 Standing horiz ABD  bil yellow TB x 10  Doorway pec low stretch 3x15" BATCA row 10# 2x15  Manual Therapy: STM to bil UT, LS, scalenes  Moist Heat x 10 min post  session to cervical spine   06/27/22 Therapeutic Exercise: UBE L1 3 min fwd/3 min back Seated levator stretch 3x15" bil Seated thoracic ext arms crossed x 10 Standing shld ext x 10 yellow TB  Standing ER with yellow TB against doorframe x 10 Standing trunk rotation x 10 bil Standing thoracic ext x 10   Manual Therapy: STM to bil rhomboids, UT, LS - more tight UT on L   PATIENT EDUCATION: Education details: HEP update 9/13, issued RTB, also given handout on orthostatic hypotension.   Person educated: Patient Education method: Explanation, Demonstration, Verbal cues, and Handouts Education  comprehension: verbalized understanding and returned demonstration   HOME EXERCISE PROGRAM: Access Code: [email or web address]  ASSESSMENT:  CLINICAL IMPRESSION: Marlowe Kays notes much improvement from PT. She noted that she was able to cook for guest over the weekend w/o increase in pain. Continued to progress postural strength, added lat pulls and pec stretch to HEP. Still requires cueing with ER using TB to keep proper elbow positioning. MT was done post session to address tightness along R scapula, good response to manual and to treatment.   OBJECTIVE IMPAIRMENTS decreased activity tolerance, decreased ROM, decreased strength, increased fascial restrictions, impaired perceived functional ability, increased muscle spasms, impaired sensation, impaired UE functional use, postural dysfunction, and pain.   ACTIVITY LIMITATIONS carrying, lifting, bending, sleeping, bed mobility, dressing, reach over head, and hygiene/grooming  PARTICIPATION LIMITATIONS: cleaning and laundry  PERSONAL FACTORS Age and 3+ comorbidities: h/o breast cancer, CHF, diverticulitis, hearing loss, neuropathy of feet due to chemotherapy, R hip OA, HTN, bil hand OA  are also affecting patient's functional outcome.   REHAB POTENTIAL: Good  CLINICAL DECISION MAKING: Evolving/moderate complexity  EVALUATION COMPLEXITY: Moderate   GOALS: Goals reviewed with patient? Yes  SHORT TERM GOALS: Target date: 06/25/2022   Patient will be independent with initial HEP.  Baseline: given Goal status: MET 06/19/22 - reports good compliance and performing 2-3x daily.    LONG TERM GOALS: Target date: 07/23/2022   Patient will be independent with advanced/ongoing HEP to improve outcomes and carryover.  Baseline: needs progression Goal status: IN PROGRESS  2.  Patient will report 75% improvement in bil shoulder pain to improve QOL.  Baseline: 6-9/10 Goal status: IN PROGRESS  3.  Patient to demonstrate improved upright posture with posterior  shoulder girdle engaged to promote improved glenohumeral joint mobility. Baseline: forward head posture Goal status: IN PROGRESS 06/19/2022 - noted improved upright posture without cuing today.  Patient much more aware of posture.    4.  Patient to improve bil shoulder AROM to [facility] without pain provocation to allow for increased ease of ADLs.  Baseline: see objective,  AROM limited by pain Goal status: IN PROGRESS  5.  Patient will demonstrate improved functional UE strength as demonstrated by 5/5 bil shoulder strength. Baseline: 4/5 bil limited by pain Goal status: IN PROGRESS  6  Patient will report <40% impairment on quickDash  to demonstrate improved functional ability.  Baseline: 56.8% impairment.  Goal status: IN PROGRESS  7.  Patient will be able to read without numbness/tingling in hands.    Baseline: increases, difficulty holding hardback books Goal status: IN PROGRESS 07/02/22- improving, places pillow in lap to improve posture and decreased numbness in hands  8. Patient will report decreased bil shoulder pain in mornings to be able to use arms to get out of bed.  Baseline: has to swing legs out, cant use arms Goal status: IN PROGRESS 07/02/2022 - significant improvement, "just a teeny struggle  now"    PLAN: PT FREQUENCY: 1-2x/week  PT DURATION: 6 weeks  PLANNED INTERVENTIONS: Therapeutic exercises, Therapeutic activity, Neuromuscular re-education, Balance training, Gait training, Patient/Family education, Self Care, Joint mobilization, Dry Needling, Electrical stimulation, Spinal mobilization, Cryotherapy, Moist heat, Traction, Ultrasound, Ionotophoresis 1m/ml Dexamethasone, Manual therapy, and Re-evaluation  PLAN FOR NEXT SESSION: postural strengthening, neck ROM and stretching, manual therapy to decrease tightness cervical spine, modalities PRN, may trial traction.   BArtist Pais PTA 07/07/2022, 2:50 PM

## 2022-07-09 ENCOUNTER — Ambulatory Visit: Payer: Medicare Other

## 2022-07-09 DIAGNOSIS — M25512 Pain in left shoulder: Secondary | ICD-10-CM | POA: Diagnosis not present

## 2022-07-09 DIAGNOSIS — R293 Abnormal posture: Secondary | ICD-10-CM

## 2022-07-09 DIAGNOSIS — M6281 Muscle weakness (generalized): Secondary | ICD-10-CM

## 2022-07-09 DIAGNOSIS — M25511 Pain in right shoulder: Secondary | ICD-10-CM | POA: Diagnosis not present

## 2022-07-09 NOTE — Therapy (Signed)
OUTPATIENT PHYSICAL THERAPY TREATMENT   Patient Name: Carla Bennett MRN: [id number] DOB:1935/07/28, 87 y.o., female Today's Date: 07/09/2022   PT End of Session - 07/09/22 1454     Visit Number 9    Number of Visits 12    Date for PT Re-Evaluation 07/23/22    Authorization Type Medicare + [employer] supplement    Progress Note Due on Visit 10    PT Start Time 1400    PT Stop Time 1445    PT Time Calculation (min) 45 min    Activity Tolerance Patient tolerated treatment well    Behavior During Therapy WFL for tasks assessed/performed                   Past Medical History:  Diagnosis Date   Allergy    Aortic regurgitation    Arthritis    hands, neck   Basal cell carcinoma    Benign neoplasm of colon    Breast cancer of upper-outer quadrant of left female breast ([place]) 02/23/2015   4'16-left breast cancer-surgery, chemo, radiation-[name] follows every 3 months   Cataract    beginning stages-monitoring by MD   Cholelithiasis    resolved with surgery   Chronic systolic CHF (congestive heart failure) ([place]) 02/26/2016   A.  Echo 02/21/16:  Mild LVH, EF 20-25%, diff HK, Gr 3 DD, mod AI, mod to severe MR, mod LAE, mod redcued RVSF, mild RAE, mod TR, PASP 53 mmHg, small pericardial effusion, mod L pleural effusion  //  B. Echo 6/17:  Mild LVH EF 30-35%, diffuse HK, grade 1 diastolic dysfunction, trivial AI, MAC, mild LAE, normal RVSF, trivial pericardial effusion     Cystocele    Diverticulitis    Diverticulosis of colon (without mention of hemorrhage)    Gout    resolved -   Hearing loss    some hearing loss in right ear- no hearing aids   Heart murmur    Hematuria    negative urology work up   History of blood transfusion 1971   x 1 with D&C -    Hx of abnormal cervical Pap smear    LEEP-CIN I, negative margins and ECC   Hypercalcemia    Hypertension    Mitral regurgitation    Neuromuscular disorder (HCC)    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   OA  (osteoarthritis) of hip    right   Other issue of medical certificates    uterine myomata   Parathyroid abnormality ([place])    being followed Dr. Barbette Hair   Radiation 10/01/15-11/16/15   left breast axillary and supraclavicular reigion 45 gray, left breast 50.4 gray, lumpectomy cavity boost 10 gray   Renal disease 3/16   Stage 3 kidney disease-seeing Dr Buddy Duty and Dr Marval Regal   Uterine prolaps    resolved after surgery   Vitamin D deficiency    Past Surgical History:  Procedure Laterality Date   BREAST LUMPECTOMY Left 08/2015   CHOLECYSTECTOMY     COLONOSCOPY  01/18/2013   [name]   COLONOSCOPY N/A 06/26/2016   Procedure: COLONOSCOPY;  Surgeon: Irene Shipper, MD;  Location: [facility];  Service: Endoscopy;  Laterality: N/A;   CYSTOCELE REPAIR  08/2010   Rectocele, vault prolapse   DILATION AND CURETTAGE OF UTERUS     x2   PORT-A-CATH REMOVAL Right 08/23/2015   Procedure: REMOVAL PORT-A-CATH;  Surgeon: Erroll Luna, MD;  Location: [facility];  Service: General;  Laterality: Right;  PORTACATH PLACEMENT Right 03/20/2015   Procedure: INSERTION PORT-A-CATH;  Surgeon: Erroll Luna, MD;  Location: [facility];  Service: General;  Laterality: Right;   RADIOACTIVE SEED GUIDED PARTIAL MASTECTOMY WITH AXILLARY SENTINEL LYMPH NODE BIOPSY Left 08/23/2015   Procedure: LEFT BREAST PARTIAL MASTECTOMY WITH SENTINEL LYMPH NODE MAPPING;  Surgeon: Erroll Luna, MD;  Location: [facility];  Service: General;  Laterality: Left;   TONSILLECTOMY AND ADENOIDECTOMY     TOTAL VAGINAL HYSTERECTOMY     secondary to prolapse, ovaries not removed   Patient Active Problem List   Diagnosis Date Noted   Mixed hyperlipidemia 08/21/2021   Diverticulosis of colon without hemorrhage    Chronic systolic CHF (congestive heart failure) ([place]) 02/26/2016   Dilated cardiomyopathy ([place]) 02/26/2016   CKD (chronic kidney disease) 02/26/2016   Essential hypertension 07/26/2015   Family history of  malignant neoplasm of gastrointestinal tract 03/08/2015   Breast cancer of upper-outer quadrant of left female breast ([place]) 02/23/2015   Hematochezia 01/06/2013    PCP: Donald Prose, MD  REFERRING PROVIDER: Olen Cordial, PA  REFERRING DIAG: M25.511 (ICD-10-CM) -Bilateral shoulder pain  THERAPY DIAG:  Acute pain of left shoulder  Acute pain of right shoulder  Abnormal posture  Muscle weakness (generalized)  Rationale for Evaluation and Treatment Rehabilitation  ONSET DATE: 5 weeks ago  SUBJECTIVE:                                                                                                                                                                                      SUBJECTIVE STATEMENT: Pt reports that her shoulders are doing better, not painful like the beginning.   PERTINENT HISTORY: PMH: basal cell carcinoma, breast cancer, CHF, diverticulitis, hearing loss, neuropathy of feet due to chemotherapy, R hip OA, HTN, bil hand OA  PAIN:  Are you having pain? No   PRECAUTIONS: Other: history of L sided breast cancer, keep resistance low.   Reporting history of orthostatic hypotension.   WEIGHT BEARING RESTRICTIONS No  FALLS:  Has patient fallen in last 6 months? No  LIVING ENVIRONMENT: Lives with: lives alone Lives in: House/apartment Stairs: No Has following equipment at home: Grab bars  OCCUPATION: Retired   PLOF: Independent  PATIENT GOALS "Be able to clean house like I used too pain free"  OBJECTIVE:   DIAGNOSTIC FINDINGS:  Cervical MRI 12/18/2020 IMPRESSION: 1. No acute fracture or acute intracranial findings. 2. Right facial soft tissue swelling overlying the maxilla. Probable soft tissue swelling along the chin eccentric to the right. 3. Periventricular white matter and corona radiata hypodensities favor chronic ischemic microvascular white matter disease. 4. Cervical spondylosis and degenerative disc disease causing  multilevel osseous  foraminal impingement. 5. Left common carotid atherosclerotic calcification. 6. Chronic paranasal sinusitis. Acute on chronic bilateral maxillary sinusitis.  PATIENT SURVEYS:  Quick Dash 56.8% disability   COGNITION:  Overall cognitive status: Within functional limits for tasks assessed     SENSATION: Slightly diminished L thumb  POSTURE: Forward head posture, rounded shoulder  CERVICAL ROM:  AROM eval  Cervical Flexion 40  Cervical Extension 45  Cervical Rotation to Left 70  Cervical Rotation to Right 70  Left Sidebend 40  Right Sidebend 30  No pain just tightness with movement.   UPPER EXTREMITY ROM:   full PROM bil,  AROM limited by pain.   Active ROM Right*  eval Left eval  Shoulder flexion 120 125*  Shoulder extension 75 75  Shoulder abduction 120 125  Shoulder internal rotation  Functional T12 Functional T12  Shoulder external rotation Functional T2 Functional T2  Elbow flexion    Elbow extension    Wrist flexion    Wrist extension    (Blank rows = not tested)  UPPER EXTREMITY MMT:  MMT Right eval Left eval  Shoulder flexion 4* 4*  Shoulder extension    Shoulder abduction 4* 4*  Shoulder adduction    Shoulder internal rotation 5 5  Shoulder external rotation 5 5  Elbow flexion 5 5  Elbow extension 5 5  Wrist flexion    Wrist extension 5 5  Wrist ulnar deviation    Wrist radial deviation    Wrist pronation    Wrist supination    Grip strength  good good  (Blank rows = not tested)  *pain  SHOULDER SPECIAL TESTS:  Painful arc bil  Negative spurling  JOINT MOBILITY TESTING:  Full PROM, no capsular tightness  PALPATION:  No pain with palpation bil shoulders.  Noted increased cervical lordosis and hypomobility, tightness anterior scalenes, levator, upper trap, and cervical paraspinals.     TODAY'S TREATMENT:  07/09/22 TherEx: UBE L1.5 3 min fwd/ 3 min back Lat pulls 15# 2x10 BATCA row 15# 2x10 Standing shoulder flexion 1# x 10 Standing  shoulder scaption 1# x 10 Doorway mid pec stretch 2x15" Standing R/L ER yellow TB x 10 Standing R/L IR yellow TB x 10 Seated UT stretch x 30 sec bil Seated scalenes stretch with towel over shoulder to depress rib 3x15" Seated thoracic ext with bil shoulder flexion x 10  Manual Therapy: STM to bil rhomboids, UT, LS   07/07/22 TherEx: UBE L1.5 [id number] Standing ER red TB standing against doorframe x 15 Standing mid horiz ABD red TB x 15 Standing high row/lat pull red TB 2x15 Standing shoulder ext x 15 red TB Seated shld flex and scap x 10 1# bil Door way pec stretch 3x20" low Mid pec stretch at doorway 2x15" Seated thoracic ext with foam roll x10 3 sec hold  Manual Therapy: STM to R rhomboids, thoracic paraspinals  07/02/22 Therapeutic Exercise: to improve strength and mobility.  Demo, verbal and tactile cues throughout for technique. UBE L1 3 min fwd/3 min back Standing rows RTB 2 x 10  Standing shoulder extension RTB 2 x 10  Standing ER bil RTB 2 x 10  Standing ulnar nerve glides and flossing.  Manual Therapy: to decrease muscle spasm and pain and improve mobility.  In supine, STM/TPR to R UT, levator scapulea, bil cervical paraspinals, PA/R UPA mobs cervical spine, NAGs into rotation.    06/30/22 Therapeutic Exercise: UBE L1 3 min fwd/ 3 min back Standing prayer shld flexion  x 10 on wall Standing trunk extension at wall x 10 Standing ER bil yellow TB 2x10 Standing horiz ABD  bil yellow TB x 10  Doorway pec low stretch 3x15" BATCA row 10# 2x15  Manual Therapy: STM to bil UT, LS, scalenes  Moist Heat x 10 min post session to cervical spine   06/27/22 Therapeutic Exercise: UBE L1 3 min fwd/3 min back Seated levator stretch 3x15" bil Seated thoracic ext arms crossed x 10 Standing shld ext x 10 yellow TB  Standing ER with yellow TB against doorframe x 10 Standing trunk rotation x 10 bil Standing thoracic ext x 10   Manual Therapy: STM to bil rhomboids, UT, LS - more  tight UT on L   PATIENT EDUCATION: Education details: HEP update 9/13, issued RTB, also given handout on orthostatic hypotension.   Person educated: Patient Education method: Explanation, Demonstration, Verbal cues, and Handouts Education comprehension: verbalized understanding and returned demonstration   HOME EXERCISE PROGRAM: Access Code: [email or web address]  ASSESSMENT:  CLINICAL IMPRESSION: Pt responded well to treatment overall. We were able to progress postural strengthening and mobility to improve natural alignment of neck and shoulders reducing strain on joints. Still mild tension noted in UT and LS but not bad as before. Pt has met LTG #2 for pain and progressing toward other goals.    OBJECTIVE IMPAIRMENTS decreased activity tolerance, decreased ROM, decreased strength, increased fascial restrictions, impaired perceived functional ability, increased muscle spasms, impaired sensation, impaired UE functional use, postural dysfunction, and pain.   ACTIVITY LIMITATIONS carrying, lifting, bending, sleeping, bed mobility, dressing, reach over head, and hygiene/grooming  PARTICIPATION LIMITATIONS: cleaning and laundry  PERSONAL FACTORS Age and 3+ comorbidities: h/o breast cancer, CHF, diverticulitis, hearing loss, neuropathy of feet due to chemotherapy, R hip OA, HTN, bil hand OA  are also affecting patient's functional outcome.   REHAB POTENTIAL: Good  CLINICAL DECISION MAKING: Evolving/moderate complexity  EVALUATION COMPLEXITY: Moderate   GOALS: Goals reviewed with patient? Yes  SHORT TERM GOALS: Target date: 06/25/2022   Patient will be independent with initial HEP.  Baseline: given Goal status: MET 06/19/22 - reports good compliance and performing 2-3x daily.    LONG TERM GOALS: Target date: 07/23/2022   Patient will be independent with advanced/ongoing HEP to improve outcomes and carryover.  Baseline: needs progression Goal status: IN PROGRESS  2.  Patient will report 75%  improvement in bil shoulder pain to improve QOL.  Baseline: 6-9/10 Goal status: MET - 07/09/22 (80-90% improvement)  3.  Patient to demonstrate improved upright posture with posterior shoulder girdle engaged to promote improved glenohumeral joint mobility. Baseline: forward head posture Goal status: IN PROGRESS 06/19/2022 - noted improved upright posture without cuing today.  Patient much more aware of posture.    4.  Patient to improve bil shoulder AROM to [facility] without pain provocation to allow for increased ease of ADLs.  Baseline: see objective,  AROM limited by pain Goal status: IN PROGRESS  5.  Patient will demonstrate improved functional UE strength as demonstrated by 5/5 bil shoulder strength. Baseline: 4/5 bil limited by pain Goal status: IN PROGRESS  6  Patient will report <40% impairment on quickDash  to demonstrate improved functional ability.  Baseline: 56.8% impairment.  Goal status: IN PROGRESS  7.  Patient will be able to read without numbness/tingling in hands.    Baseline: increases, difficulty holding hardback books Goal status: IN PROGRESS 07/02/22- improving, places pillow in lap to improve posture and decreased numbness in hands  8.  Patient will report decreased bil shoulder pain in mornings to be able to use arms to get out of bed.  Baseline: has to swing legs out, cant use arms Goal status: IN PROGRESS 07/02/2022 - significant improvement, "just a teeny struggle now"    PLAN: PT FREQUENCY: 1-2x/week  PT DURATION: 6 weeks  PLANNED INTERVENTIONS: Therapeutic exercises, Therapeutic activity, Neuromuscular re-education, Balance training, Gait training, Patient/Family education, Self Care, Joint mobilization, Dry Needling, Electrical stimulation, Spinal mobilization, Cryotherapy, Moist heat, Traction, Ultrasound, Ionotophoresis 13m/ml Dexamethasone, Manual therapy, and Re-evaluation  PLAN FOR NEXT SESSION: postural strengthening, neck ROM and stretching, manual  therapy to decrease tightness cervical spine, modalities PRN, may trial traction.   BArtist Pais PTA 07/09/2022, 2:57 PM

## 2022-07-14 ENCOUNTER — Ambulatory Visit: Payer: Medicare Other

## 2022-07-14 DIAGNOSIS — M6281 Muscle weakness (generalized): Secondary | ICD-10-CM | POA: Diagnosis not present

## 2022-07-14 DIAGNOSIS — M25512 Pain in left shoulder: Secondary | ICD-10-CM | POA: Diagnosis not present

## 2022-07-14 DIAGNOSIS — R293 Abnormal posture: Secondary | ICD-10-CM | POA: Diagnosis not present

## 2022-07-14 DIAGNOSIS — M25511 Pain in right shoulder: Secondary | ICD-10-CM | POA: Diagnosis not present

## 2022-07-14 NOTE — Therapy (Signed)
OUTPATIENT PHYSICAL THERAPY TREATMENT Progress Note Reporting Period 06/11/22 to 9/25//23  See note below for Objective Data and Assessment of Progress/Goals.      Patient Name: Carla Bennett MRN: [id number] DOB:08-08-1935, 87 y.o., female Today's Date: 07/14/2022   PT End of Session - 07/14/22 1443     Visit Number 10    Number of Visits 12    Date for PT Re-Evaluation 07/23/22    Authorization Type Medicare + [employer] supplement    Progress Note Due on Visit 10    PT Start Time 1402    PT Stop Time 1443    PT Time Calculation (min) 41 min    Activity Tolerance Patient tolerated treatment well    Behavior During Therapy WFL for tasks assessed/performed                    Past Medical History:  Diagnosis Date   Allergy    Aortic regurgitation    Arthritis    hands, neck   Basal cell carcinoma    Benign neoplasm of colon    Breast cancer of upper-outer quadrant of left female breast ([facility]) 02/23/2015   4'16-left breast cancer-surgery, chemo, radiation-[name] follows every 3 months   Cataract    beginning stages-monitoring by MD   Cholelithiasis    resolved with surgery   Chronic systolic CHF (congestive heart failure) ([facility]) 02/26/2016   A.  Echo 02/21/16:  Mild LVH, EF 20-25%, diff HK, Gr 3 DD, mod AI, mod to severe MR, mod LAE, mod redcued RVSF, mild RAE, mod TR, PASP 53 mmHg, small pericardial effusion, mod L pleural effusion  //  B. Echo 6/17:  Mild LVH EF 30-35%, diffuse HK, grade 1 diastolic dysfunction, trivial AI, MAC, mild LAE, normal RVSF, trivial pericardial effusion     Cystocele    Diverticulitis    Diverticulosis of colon (without mention of hemorrhage)    Gout    resolved -   Hearing loss    some hearing loss in right ear- no hearing aids   Heart murmur    Hematuria    negative urology work up   History of blood transfusion 1971   x 1 with D&C -    Hx of abnormal cervical Pap smear    LEEP-CIN I, negative margins and ECC   Hypercalcemia     Hypertension    Mitral regurgitation    Neuromuscular disorder (HCC)    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   OA (osteoarthritis) of hip    right   Other issue of medical certificates    uterine myomata   Parathyroid abnormality ([facility])    being followed Dr. Barbette Hair   Radiation 10/01/15-11/16/15   left breast axillary and supraclavicular reigion 45 gray, left breast 50.4 gray, lumpectomy cavity boost 10 gray   Renal disease 3/16   Stage 3 kidney disease-seeing Dr Buddy Duty and Dr Marval Regal   Uterine prolaps    resolved after surgery   Vitamin D deficiency    Past Surgical History:  Procedure Laterality Date   BREAST LUMPECTOMY Left 08/2015   CHOLECYSTECTOMY     COLONOSCOPY  01/18/2013   [name]   COLONOSCOPY N/A 06/26/2016   Procedure: COLONOSCOPY;  Surgeon: Irene Shipper, MD;  Location: [facility];  Service: Endoscopy;  Laterality: N/A;   CYSTOCELE REPAIR  08/2010   Rectocele, vault prolapse   DILATION AND CURETTAGE OF UTERUS     x2   PORT-A-CATH REMOVAL Right 08/23/2015  Procedure: REMOVAL PORT-A-CATH;  Surgeon: Erroll Luna, MD;  Location: [facility];  Service: General;  Laterality: Right;   PORTACATH PLACEMENT Right 03/20/2015   Procedure: INSERTION PORT-A-CATH;  Surgeon: Erroll Luna, MD;  Location: [facility];  Service: General;  Laterality: Right;   RADIOACTIVE SEED GUIDED PARTIAL MASTECTOMY WITH AXILLARY SENTINEL LYMPH NODE BIOPSY Left 08/23/2015   Procedure: LEFT BREAST PARTIAL MASTECTOMY WITH SENTINEL LYMPH NODE MAPPING;  Surgeon: Erroll Luna, MD;  Location: [facility];  Service: General;  Laterality: Left;   TONSILLECTOMY AND ADENOIDECTOMY     TOTAL VAGINAL HYSTERECTOMY     secondary to prolapse, ovaries not removed   Patient Active Problem List   Diagnosis Date Noted   Mixed hyperlipidemia 08/21/2021   Diverticulosis of colon without hemorrhage    Chronic systolic CHF (congestive heart failure) ([facility]) 02/26/2016   Dilated cardiomyopathy  ([facility]) 02/26/2016   CKD (chronic kidney disease) 02/26/2016   Essential hypertension 07/26/2015   Family history of malignant neoplasm of gastrointestinal tract 03/08/2015   Breast cancer of upper-outer quadrant of left female breast ([facility]) 02/23/2015   Hematochezia 01/06/2013    PCP: Donald Prose, MD  REFERRING PROVIDER: Olen Cordial, PA  REFERRING DIAG: M25.511 (ICD-10-CM) -Bilateral shoulder pain  THERAPY DIAG:  Acute pain of left shoulder  Acute pain of right shoulder  Abnormal posture  Muscle weakness (generalized)  Rationale for Evaluation and Treatment Rehabilitation  ONSET DATE: 5 weeks ago  SUBJECTIVE:                                                                                                                                                                                      SUBJECTIVE STATEMENT: Pt reports just some generalized aching from weather changes d/t arthritis.   PERTINENT HISTORY: PMH: basal cell carcinoma, breast cancer, CHF, diverticulitis, hearing loss, neuropathy of feet due to chemotherapy, R hip OA, HTN, bil hand OA  PAIN:  Are you having pain? No   PRECAUTIONS: Other: history of L sided breast cancer, keep resistance low.   Reporting history of orthostatic hypotension.   WEIGHT BEARING RESTRICTIONS No  FALLS:  Has patient fallen in last 6 months? No  LIVING ENVIRONMENT: Lives with: lives alone Lives in: House/apartment Stairs: No Has following equipment at home: Grab bars  OCCUPATION: Retired   PLOF: Independent  PATIENT GOALS "Be able to clean house like I used too pain free"  OBJECTIVE:   DIAGNOSTIC FINDINGS:  Cervical MRI 12/18/2020 IMPRESSION: 1. No acute fracture or acute intracranial findings. 2. Right facial soft tissue swelling overlying the maxilla. Probable soft tissue swelling along the chin eccentric to the right. 3. Periventricular white  matter and corona radiata hypodensities favor chronic ischemic  microvascular white matter disease. 4. Cervical spondylosis and degenerative disc disease causing multilevel osseous foraminal impingement. 5. Left common carotid atherosclerotic calcification. 6. Chronic paranasal sinusitis. Acute on chronic bilateral maxillary sinusitis.  PATIENT SURVEYS:  Quick Dash 56.8% disability   COGNITION:  Overall cognitive status: Within functional limits for tasks assessed     SENSATION: Slightly diminished L thumb  POSTURE: Forward head posture, rounded shoulder  CERVICAL ROM:  AROM eval  Cervical Flexion 40  Cervical Extension 45  Cervical Rotation to Left 70  Cervical Rotation to Right 70  Left Sidebend 40  Right Sidebend 30  No pain just tightness with movement.   UPPER EXTREMITY ROM:   full PROM bil,  AROM limited by pain.   Active ROM Right*  eval Left eval Right 07/14/22 Left 07/14/22  Shoulder flexion 120 125* 159 145  Shoulder extension 75 75    Shoulder abduction 120 125 155 157  Shoulder internal rotation  Functional T12 Functional T12 FIR to T9 FIR to T7  Shoulder external rotation Functional T2 Functional T2 FER to T2 FER to T2  Elbow flexion      Elbow extension      Wrist flexion      Wrist extension      (Blank rows = not tested)  UPPER EXTREMITY MMT:  MMT Right eval Left eval Left 07/14/22 Right 07/14/22  Shoulder flexion 4* 4* 4+ 4+  Shoulder extension      Shoulder abduction 4* 4* 4+ 4  Shoulder adduction      Shoulder internal rotation _0 Shoulder external rotation _1 Elbow flexion 5 5    Elbow extension 5 5    Wrist flexion      Wrist extension 5 5    Wrist ulnar deviation      Wrist radial deviation      Wrist pronation      Wrist supination      Grip strength  good good    (Blank rows = not tested)  *pain  SHOULDER SPECIAL TESTS:  Painful arc bil  Negative spurling  JOINT MOBILITY TESTING:  Full PROM, no capsular tightness  PALPATION:  No pain with palpation bil shoulders.   Noted increased cervical lordosis and hypomobility, tightness anterior scalenes, levator, upper trap, and cervical paraspinals.     TODAY'S TREATMENT:  07/14/22 TherEx: UBE L1.5 3 min fwd/ 3 min back Assessment of: AROM MMT Quick Dash: 27.3% Consulted with patient about progress and remaining POC 07/09/22 TherEx: UBE L1.5 3 min fwd/ 3 min back Lat pulls 15# 2x10 BATCA row 15# 2x10 Standing shoulder flexion 1# x 10 Standing shoulder scaption 1# x 10 Doorway mid pec stretch 2x15" Standing R/L ER yellow TB x 10 Standing R/L IR yellow TB x 10 Seated UT stretch x 30 sec bil Seated scalenes stretch with towel over shoulder to depress rib 3x15" Seated thoracic ext with bil shoulder flexion x 10  Manual Therapy: STM to bil rhomboids, UT, LS   07/07/22 TherEx: UBE L1.5 [id number] Standing ER red TB standing against doorframe x 15 Standing mid horiz ABD red TB x 15 Standing high row/lat pull red TB 2x15 Standing shoulder ext x 15 red TB Seated shld flex and scap x 10 1# bil Door way pec stretch 3x20" low Mid pec stretch at doorway 2x15" Seated thoracic ext with foam roll x10 3 sec hold  Manual Therapy: STM  to R rhomboids, thoracic paraspinals  07/02/22 Therapeutic Exercise: to improve strength and mobility.  Demo, verbal and tactile cues throughout for technique. UBE L1 3 min fwd/3 min back Standing rows RTB 2 x 10  Standing shoulder extension RTB 2 x 10  Standing ER bil RTB 2 x 10  Standing ulnar nerve glides and flossing.  Manual Therapy: to decrease muscle spasm and pain and improve mobility.  In supine, STM/TPR to R UT, levator scapulea, bil cervical paraspinals, PA/R UPA mobs cervical spine, NAGs into rotation.    06/30/22 Therapeutic Exercise: UBE L1 3 min fwd/ 3 min back Standing prayer shld flexion x 10 on wall Standing trunk extension at wall x 10 Standing ER bil yellow TB 2x10 Standing horiz ABD  bil yellow TB x 10  Doorway pec low stretch 3x15" BATCA row 10#  2x15  Manual Therapy: STM to bil UT, LS, scalenes  Moist Heat x 10 min post session to cervical spine   06/27/22 Therapeutic Exercise: UBE L1 3 min fwd/3 min back Seated levator stretch 3x15" bil Seated thoracic ext arms crossed x 10 Standing shld ext x 10 yellow TB  Standing ER with yellow TB against doorframe x 10 Standing trunk rotation x 10 bil Standing thoracic ext x 10   Manual Therapy: STM to bil rhomboids, UT, LS - more tight UT on L   PATIENT EDUCATION: Education details: HEP update 9/13, issued RTB, also given handout on orthostatic hypotension.   Person educated: Patient Education method: Explanation, Demonstration, Verbal cues, and Handouts Education comprehension: verbalized understanding and returned demonstration   HOME EXERCISE PROGRAM: Access Code: [email or web address]  ASSESSMENT:  CLINICAL IMPRESSION:  Pt has progressed well with PT. She indicated improvements in shoulder ROM and MMT objective info. Quick DASH score has improved and met the goal. She has met her goal for postural alignment as well. Functionally she reports difficulty with opening jars/water bottles and fastening bra strap reaching behind head. Pt would continue to benefit from skilled PT to improve functional abilities.    OBJECTIVE IMPAIRMENTS decreased activity tolerance, decreased ROM, decreased strength, increased fascial restrictions, impaired perceived functional ability, increased muscle spasms, impaired sensation, impaired UE functional use, postural dysfunction, and pain.   ACTIVITY LIMITATIONS carrying, lifting, bending, sleeping, bed mobility, dressing, reach over head, and hygiene/grooming  PARTICIPATION LIMITATIONS: cleaning and laundry  PERSONAL FACTORS Age and 3+ comorbidities: h/o breast cancer, CHF, diverticulitis, hearing loss, neuropathy of feet due to chemotherapy, R hip OA, HTN, bil hand OA  are also affecting patient's functional outcome.   REHAB POTENTIAL: Good  CLINICAL  DECISION MAKING: Evolving/moderate complexity  EVALUATION COMPLEXITY: Moderate   GOALS: Goals reviewed with patient? Yes  SHORT TERM GOALS: Target date: 06/25/2022   Patient will be independent with initial HEP.  Baseline: given Goal status: MET 06/19/22 - reports good compliance and performing 2-3x daily.    LONG TERM GOALS: Target date: 07/23/2022   Patient will be independent with advanced/ongoing HEP to improve outcomes and carryover.  Baseline: needs progression Goal status: IN PROGRESS  2.  Patient will report 75% improvement in bil shoulder pain to improve QOL.  Baseline: 6-9/10 Goal status: MET - 07/09/22 (80-90% improvement)  3.  Patient to demonstrate improved upright posture with posterior shoulder girdle engaged to promote improved glenohumeral joint mobility. Baseline: forward head posture Goal status: MET - 07/14/22   4.  Patient to improve bil shoulder AROM to [facility] without pain provocation to allow for increased ease of ADLs.  Baseline: see  objective,  AROM limited by pain Goal status: IN PROGRESS  5.  Patient will demonstrate improved functional UE strength as demonstrated by 5/5 bil shoulder strength. Baseline: 4/5 bil limited by pain Goal status: IN PROGRESS  6  Patient will report <40% impairment on quickDash  to demonstrate improved functional ability.  Baseline: 56.8% impairment.  Goal status: MET - 07/14/22  7.  Patient will be able to read without numbness/tingling in hands.    Baseline: increases, difficulty holding hardback books Goal status: IN PROGRESS 07/02/22- improving, places pillow in lap to improve posture and decreased numbness in hands  8. Patient will report decreased bil shoulder pain in mornings to be able to use arms to get out of bed.  Baseline: has to swing legs out, cant use arms Goal status: IN PROGRESS 07/02/2022 - significant improvement, "just a teeny struggle now"    PLAN: PT FREQUENCY: 1-2x/week  PT DURATION: 6  weeks  PLANNED INTERVENTIONS: Therapeutic exercises, Therapeutic activity, Neuromuscular re-education, Balance training, Gait training, Patient/Family education, Self Care, Joint mobilization, Dry Needling, Electrical stimulation, Spinal mobilization, Cryotherapy, Moist heat, Traction, Ultrasound, Ionotophoresis 29m/ml Dexamethasone, Manual therapy, and Re-evaluation  PLAN FOR NEXT SESSION: postural strengthening, neck ROM and stretching, manual therapy to decrease tightness cervical spine, modalities PRN, may trial traction.   BArtist Pais PTA 07/14/2022, 2:44 PM

## 2022-07-16 ENCOUNTER — Ambulatory Visit: Payer: Medicare Other

## 2022-07-16 ENCOUNTER — Encounter: Payer: Self-pay | Admitting: Physical Therapy

## 2022-07-16 DIAGNOSIS — R293 Abnormal posture: Secondary | ICD-10-CM | POA: Diagnosis not present

## 2022-07-16 DIAGNOSIS — M25512 Pain in left shoulder: Secondary | ICD-10-CM | POA: Diagnosis not present

## 2022-07-16 DIAGNOSIS — M25511 Pain in right shoulder: Secondary | ICD-10-CM

## 2022-07-16 DIAGNOSIS — M6281 Muscle weakness (generalized): Secondary | ICD-10-CM

## 2022-07-16 NOTE — Addendum Note (Signed)
Addended by: Rennie Natter on: 07/16/2022 05:06 PM   Modules accepted: Orders

## 2022-07-16 NOTE — Therapy (Addendum)
OUTPATIENT PHYSICAL THERAPY TREATMENT      Patient Name: Carla Bennett MRN: [id number] DOB:June 18, 1935, 87 y.o., female Today's Date: 07/16/2022   PT End of Session - 07/16/22 1451     Visit Number 11    Number of Visits 20    Date for PT Re-Evaluation 08/20/22    Authorization Type Medicare + [employer] supplement    Progress Note Due on Visit 59    PT Start Time 1408    PT Stop Time 1449    PT Time Calculation (min) 41 min    Activity Tolerance Patient tolerated treatment well    Behavior During Therapy WFL for tasks assessed/performed                     Past Medical History:  Diagnosis Date   Allergy    Aortic regurgitation    Arthritis    hands, neck   Basal cell carcinoma    Benign neoplasm of colon    Breast cancer of upper-outer quadrant of left female breast ([place]) 02/23/2015   4'16-left breast cancer-surgery, chemo, radiation-[name] follows every 3 months   Cataract    beginning stages-monitoring by MD   Cholelithiasis    resolved with surgery   Chronic systolic CHF (congestive heart failure) ([place]) 02/26/2016   A.  Echo 02/21/16:  Mild LVH, EF 20-25%, diff HK, Gr 3 DD, mod AI, mod to severe MR, mod LAE, mod redcued RVSF, mild RAE, mod TR, PASP 53 mmHg, small pericardial effusion, mod L pleural effusion  //  B. Echo 6/17:  Mild LVH EF 30-35%, diffuse HK, grade 1 diastolic dysfunction, trivial AI, MAC, mild LAE, normal RVSF, trivial pericardial effusion     Cystocele    Diverticulitis    Diverticulosis of colon (without mention of hemorrhage)    Gout    resolved -   Hearing loss    some hearing loss in right ear- no hearing aids   Heart murmur    Hematuria    negative urology work up   History of blood transfusion 1971   x 1 with D&C -    Hx of abnormal cervical Pap smear    LEEP-CIN I, negative margins and ECC   Hypercalcemia    Hypertension    Mitral regurgitation    Neuromuscular disorder (HCC)    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   OA  (osteoarthritis) of hip    right   Other issue of medical certificates    uterine myomata   Parathyroid abnormality ([place])    being followed Dr. Barbette Hair   Radiation 10/01/15-11/16/15   left breast axillary and supraclavicular reigion 45 gray, left breast 50.4 gray, lumpectomy cavity boost 10 gray   Renal disease 3/16   Stage 3 kidney disease-seeing Dr Buddy Duty and Dr Marval Regal   Uterine prolaps    resolved after surgery   Vitamin D deficiency    Past Surgical History:  Procedure Laterality Date   BREAST LUMPECTOMY Left 08/2015   CHOLECYSTECTOMY     COLONOSCOPY  01/18/2013   [name]   COLONOSCOPY N/A 06/26/2016   Procedure: COLONOSCOPY;  Surgeon: Irene Shipper, MD;  Location: [facility];  Service: Endoscopy;  Laterality: N/A;   CYSTOCELE REPAIR  08/2010   Rectocele, vault prolapse   DILATION AND CURETTAGE OF UTERUS     x2   PORT-A-CATH REMOVAL Right 08/23/2015   Procedure: REMOVAL PORT-A-CATH;  Surgeon: Erroll Luna, MD;  Location: [facility];  Service: General;  Laterality: Right;   PORTACATH PLACEMENT Right 03/20/2015   Procedure: INSERTION PORT-A-CATH;  Surgeon: Erroll Luna, MD;  Location: [place];  Service: General;  Laterality: Right;   RADIOACTIVE SEED GUIDED PARTIAL MASTECTOMY WITH AXILLARY SENTINEL LYMPH NODE BIOPSY Left 08/23/2015   Procedure: LEFT BREAST PARTIAL MASTECTOMY WITH SENTINEL LYMPH NODE MAPPING;  Surgeon: Erroll Luna, MD;  Location: [facility];  Service: General;  Laterality: Left;   TONSILLECTOMY AND ADENOIDECTOMY     TOTAL VAGINAL HYSTERECTOMY     secondary to prolapse, ovaries not removed   Patient Active Problem List   Diagnosis Date Noted   Mixed hyperlipidemia 08/21/2021   Diverticulosis of colon without hemorrhage    Chronic systolic CHF (congestive heart failure) ([place]) 02/26/2016   Dilated cardiomyopathy ([place]) 02/26/2016   CKD (chronic kidney disease) 02/26/2016   Essential hypertension 07/26/2015   Family history of  malignant neoplasm of gastrointestinal tract 03/08/2015   Breast cancer of upper-outer quadrant of left female breast ([place]) 02/23/2015   Hematochezia 01/06/2013    PCP: Donald Prose, MD  REFERRING PROVIDER: Olen Cordial, PA  REFERRING DIAG: M25.511 (ICD-10-CM) -Bilateral shoulder pain  THERAPY DIAG:  Acute pain of left shoulder  Acute pain of right shoulder  Abnormal posture  Muscle weakness (generalized)  Rationale for Evaluation and Treatment Rehabilitation  ONSET DATE: 5 weeks ago  SUBJECTIVE:                                                                                                                                                                                      SUBJECTIVE STATEMENT: Pt reports she is doing good today, still noticing less grip strength with opening jars and water bottles.    PERTINENT HISTORY: PMH: basal cell carcinoma, breast cancer, CHF, diverticulitis, hearing loss, neuropathy of feet due to chemotherapy, R hip OA, HTN, bil hand OA  PAIN:  Are you having pain? No   PRECAUTIONS: Other: history of L sided breast cancer, keep resistance low.   Reporting history of orthostatic hypotension.   WEIGHT BEARING RESTRICTIONS No  FALLS:  Has patient fallen in last 6 months? No  LIVING ENVIRONMENT: Lives with: lives alone Lives in: House/apartment Stairs: No Has following equipment at home: Grab bars  OCCUPATION: Retired   PLOF: Independent  PATIENT GOALS "Be able to clean house like I used too pain free"  OBJECTIVE:   DIAGNOSTIC FINDINGS:  Cervical MRI 12/18/2020 IMPRESSION: 1. No acute fracture or acute intracranial findings. 2. Right facial soft tissue swelling overlying the maxilla. Probable soft tissue swelling along the chin eccentric to the right. 3. Periventricular white matter and corona radiata hypodensities favor chronic  ischemic microvascular white matter disease. 4. Cervical spondylosis and degenerative disc disease  causing multilevel osseous foraminal impingement. 5. Left common carotid atherosclerotic calcification. 6. Chronic paranasal sinusitis. Acute on chronic bilateral maxillary sinusitis.  PATIENT SURVEYS:  Quick Dash 56.8% disability   COGNITION:  Overall cognitive status: Within functional limits for tasks assessed     SENSATION: Slightly diminished L thumb  POSTURE: Forward head posture, rounded shoulder  CERVICAL ROM:  AROM eval 07/16/22  Cervical Flexion 40 43  Cervical Extension 45 49  Cervical Rotation to Left 70 65  Cervical Rotation to Right 70 65  Left Sidebend 40 39  Right Sidebend 30 31  No pain just tightness with movement.   UPPER EXTREMITY ROM:   full PROM bil,  AROM limited by pain.   Active ROM Right*  eval Left eval Right 07/14/22 Left 07/14/22  Shoulder flexion 120 125* 159 145  Shoulder extension 75 75    Shoulder abduction 120 125 155 157  Shoulder internal rotation  Functional T12 Functional T12 FIR to T9 FIR to T7  Shoulder external rotation Functional T2 Functional T2 FER to T2 FER to T2  Elbow flexion      Elbow extension      Wrist flexion      Wrist extension      (Blank rows = not tested)  UPPER EXTREMITY MMT:  MMT Right eval Left eval Left 07/14/22 Right 07/14/22  Shoulder flexion 4* 4* 4+ 4+  Shoulder extension      Shoulder abduction 4* 4* 4+ 4  Shoulder adduction      Shoulder internal rotation _0 Shoulder external rotation _1 Elbow flexion 5 5    Elbow extension 5 5    Wrist flexion      Wrist extension 5 5    Wrist ulnar deviation      Wrist radial deviation      Wrist pronation      Wrist supination      Grip strength  good good    (Blank rows = not tested)  *pain  SHOULDER SPECIAL TESTS:  Painful arc bil  Negative spurling  JOINT MOBILITY TESTING:  Full PROM, no capsular tightness  PALPATION:  No pain with palpation bil shoulders.  Noted increased cervical lordosis and hypomobility, tightness  anterior scalenes, levator, upper trap, and cervical paraspinals.     TODAY'S TREATMENT:  07/16/22 TherEx: UBE L2.0 3 min fwd/ 3 min back Putty squeeze yellow theraputy x 10; 3 finger pinch with yellow putty x 10 Cervical SNAGs rotation with pillowcase 6x bil Seated shoulder flexion with cane x 10 Grip strength: L UE: 20.3lb; R UE: 7.8lb  07/14/22 TherEx: UBE L1.5 3 min fwd/ 3 min back Assessment of: AROM MMT Quick Dash: 27.3% Consulted with patient about progress and remaining POC 07/09/22 TherEx: UBE L1.5 3 min fwd/ 3 min back Lat pulls 15# 2x10 BATCA row 15# 2x10 Standing shoulder flexion 1# x 10 Standing shoulder scaption 1# x 10 Doorway mid pec stretch 2x15" Standing R/L ER yellow TB x 10 Standing R/L IR yellow TB x 10 Seated UT stretch x 30 sec bil Seated scalenes stretch with towel over shoulder to depress rib 3x15" Seated thoracic ext with bil shoulder flexion x 10  Manual Therapy: STM to bil rhomboids, UT, LS    PATIENT EDUCATION: Education details: HEP update 9/13, issued RTB, also given handout on orthostatic hypotension.   Person educated: Patient Education method: Explanation,  Demonstration, Verbal cues, and Handouts Education comprehension: verbalized understanding and returned demonstration   HOME EXERCISE PROGRAM: Access Code: [email or web address]  ASSESSMENT:  CLINICAL IMPRESSION:  Pt continues to do well with exercises. She demonstrates good cervical ROM with only reports of tightness with bil rotation.  Grip strength could use some work as she only has 7.8lb on L hand. Started putty exercises to begin working on grip strength. Pt did have a report of dizziness with SNAG, however with rest this subsided but advised if her continues to have problems with dizziness or low BP to reach out to PCP.  Overall patient is making good progress towards goals, but still reports numbness in L hand even driving short distances.  Her pain has improved significant, as has her  bil shoulder ROM, but she still demonstrates weakness in both shoulders.  Based on good progress towards goals but remaining functional deficits, she would benefit from continued physical therapy 2x/week until 08/20/2022.      OBJECTIVE IMPAIRMENTS decreased activity tolerance, decreased ROM, decreased strength, increased fascial restrictions, impaired perceived functional ability, increased muscle spasms, impaired sensation, impaired UE functional use, postural dysfunction, and pain.   ACTIVITY LIMITATIONS carrying, lifting, bending, sleeping, bed mobility, dressing, reach over head, and hygiene/grooming  PARTICIPATION LIMITATIONS: cleaning and laundry  PERSONAL FACTORS Age and 3+ comorbidities: h/o breast cancer, CHF, diverticulitis, hearing loss, neuropathy of feet due to chemotherapy, R hip OA, HTN, bil hand OA  are also affecting patient's functional outcome.   REHAB POTENTIAL: Good  CLINICAL DECISION MAKING: Evolving/moderate complexity  EVALUATION COMPLEXITY: Moderate   GOALS: Goals reviewed with patient? Yes  SHORT TERM GOALS: Target date: 06/25/2022   Patient will be independent with initial HEP.  Baseline: given Goal status: MET 06/19/22 - reports good compliance and performing 2-3x daily.    LONG TERM GOALS: Target date: 07/23/2022 extended to 08/20/2022.   Patient will be independent with advanced/ongoing HEP to improve outcomes and carryover.  Baseline: needs progression Goal status: IN PROGRESS  2.  Patient will report 75% improvement in bil shoulder pain to improve QOL.  Baseline: 6-9/10 Goal status: MET - 07/09/22 (80-90% improvement)  3.  Patient to demonstrate improved upright posture with posterior shoulder girdle engaged to promote improved glenohumeral joint mobility. Baseline: forward head posture Goal status: MET - 07/14/22   4.  Patient to improve bil shoulder AROM to [facility] without pain provocation to allow for increased ease of ADLs.  Baseline: see objective,   AROM limited by pain Goal status: IN PROGRESS  5.  Patient will demonstrate improved functional UE strength as demonstrated by 5/5 bil shoulder strength. Baseline: 4/5 bil limited by pain Goal status: IN PROGRESS 07/14/2022 4+/5 but not painful.   6  Patient will report <40% impairment on quickDash  to demonstrate improved functional ability.  Baseline: 56.8% impairment.  Goal status: MET - 07/14/22  7.  Patient will be able to read without numbness/tingling in hands.    Baseline: increases, difficulty holding hardback books Goal status: IN PROGRESS 07/02/22- improving, places pillow in lap to improve posture and decreased numbness in hands 07/16/2022- gets L hand numbness driving.   8. Patient will report decreased bil shoulder pain in mornings to be able to use arms to get out of bed.  Baseline: has to swing legs out, cant use arms Goal status: IN PROGRESS 07/02/2022 - significant improvement, "just a teeny struggle now"    PLAN: PT FREQUENCY: 1-2x/week  PT DURATION: 6 weeks extending 4 weeks to 08/20/2022  PLANNED INTERVENTIONS: Therapeutic exercises, Therapeutic activity, Neuromuscular re-education, Balance training, Gait training, Patient/Family education, Self Care, Joint mobilization, Dry Needling, Electrical stimulation, Spinal mobilization, Cryotherapy, Moist heat, Traction, Ultrasound, Ionotophoresis 44m/ml Dexamethasone, Manual therapy, and Re-evaluation  PLAN FOR NEXT SESSION: postural strengthening, neck ROM and stretching, manual therapy to decrease tightness cervical spine, modalities PRN, may trial traction.   ERennie Natter PT, DPT  07/16/2022, 4:57 PM  BArtist Pais PTA 07/16/2022, 2:56 PM

## 2022-07-18 ENCOUNTER — Ambulatory Visit: Payer: Medicare Other | Attending: Cardiology | Admitting: Physician Assistant

## 2022-07-18 ENCOUNTER — Encounter: Payer: Self-pay | Admitting: Physician Assistant

## 2022-07-18 VITALS — BP 118/42 | HR 70 | Ht 59.75 in | Wt 156.0 lb

## 2022-07-18 DIAGNOSIS — R55 Syncope and collapse: Secondary | ICD-10-CM

## 2022-07-18 DIAGNOSIS — I5022 Chronic systolic (congestive) heart failure: Secondary | ICD-10-CM

## 2022-07-18 DIAGNOSIS — N184 Chronic kidney disease, stage 4 (severe): Secondary | ICD-10-CM

## 2022-07-18 DIAGNOSIS — I10 Essential (primary) hypertension: Secondary | ICD-10-CM | POA: Diagnosis not present

## 2022-07-18 MED ORDER — FUROSEMIDE 20 MG PO TABS: 20.0000 mg | ORAL_TABLET | ORAL | Status: DC | PRN

## 2022-07-18 MED ORDER — CARVEDILOL 25 MG PO TABS: 12.5000 mg | ORAL_TABLET | Freq: Two times a day (BID) | ORAL | Status: DC

## 2022-07-18 NOTE — Patient Instructions (Signed)
Medication Instructions:  1.Take lasix 20 mg only as needed for fluid  *If you need a refill on your cardiac medications before your next appointment, please call your pharmacy*   Lab Work: None If you have labs (blood work) drawn today and your tests are completely normal, you will receive your results only by: [place] (if you have MyChart) OR A paper copy in the mail If you have any lab test that is abnormal or we need to change your treatment, we will call you to review the results.   Testing/Procedures: Your physician has requested that you have an echocardiogram. Echocardiography is a painless test that uses sound waves to create images of your heart. It provides your doctor with information about the size and shape of your heart and how well your heart's chambers and valves are working. This procedure takes approximately one hour. There are no restrictions for this procedure.    Follow-Up: At [facility], you and your health needs are our priority.  As part of our continuing mission to provide you with exceptional heart care, we have created designated Provider Care Teams.  These Care Teams include your primary Cardiologist (physician) and Advanced Practice Providers (APPs -  Physician Assistants and Nurse Practitioners) who all work together to provide you with the care you need, when you need it.   Your next appointment:   December  The format for your next appointment:   In Person  Provider:   Candee Furbish, MD    Other Instructions Try to stay hydrated with at least 64 oz of fluid daily.  Important Information About [place]

## 2022-07-18 NOTE — Progress Notes (Signed)
Office Visit    Patient Name: Carla Bennett Date of Encounter: 07/18/2022  PCP:  Donald Prose, MD   [place]  Cardiologist:  Candee Furbish, MD  Advanced Practice Provider:  No care team member to display Electrophysiologist:  None   HPI    Carla Bennett is a 87 y.o. female with a past medical history of hypertension, CHF, prior chemotherapy for breast cancer, pericardial effusion presents today for follow-up for hypotension.  On her echocardiogram originally, her ejection fraction was markedly reduced at 20 to 25%.  She had been treated with Adriamycin.  She also showed moderate pericardial effusion.  She had an echocardiogram performed 03/13/2015 which at that time showed no evidence of pericardial effusion.  Mild mitral and aortic regurgitation noted.  Normal ejection fraction.  This was prechemotherapy for breast cancer.  She had a moderately sized pericardial effusion viewed on CT scan 01/2016.  Aortic atherosclerosis with mild coronary artery atherosclerosis was also noted.  Pleural effusions were also noted.  Subsequent follow-up with an echo 1 month later showed ejection fraction 35%.  Improved.  Effusion was trace.  She was still having some GI issues for several years and ended up having a diet where she eliminated many fruits.  She was following Dr. Henrene Pastor.  Spironolactone was previously stopped due to hypotension and lethargy but was eventually restarted.  She was last seen 02/2022 and noted that her blood pressure was low.  It was 116/60 in the clinic.  She states that she feels best when her blood pressure is 324 systolic.  When her BP is lower she feels like she is going to pass out.  Per her blood pressure log, there were a few low readings typically in the mid afternoons.  She notes this is when she usually feels the worst with no energy.  Sometimes she forgets to take her nighttime dose until reports she goes to bed instead of around 9 PM.   Lately she admits to not walking much for exercise.  She planned to work on this.  Today, she provided me with an extensive log which involved blood pressures taken in the morning before medications, blood pressures taken midday, and blood pressures taken in the evening (unclear if it was before or after medications).  Blood pressures in the morning and evening were generally high and then midday was dropping low and this is when the patient feels the worst.  Lowest blood pressure noted midday was 99/53 with a pulse rate of 63.  She is not fluid overloaded today so we have changed her Lasix to as needed and continued her on her spironolactone which she takes at night.  She also tells me that when she is in pain her blood pressure spikes up which is a normal response.  We encouraged her to stay hydrated throughout the day.  We also ordered an echocardiogram since the patient was concerned about an episode of passing out.  She takes most of her blood pressure pills at night but is still taking Coreg 12.5 mg.  We discussed taking blood pressure an hour after morning medicines and an hour after evening medicines and keeping track of those values.  Reports no shortness of breath nor dyspnea on exertion. Reports no chest pain, pressure, or tightness. No edema, orthopnea, PND. Reports no palpitations.    Past Medical History    Past Medical History:  Diagnosis Date   Allergy    Aortic regurgitation  Arthritis    hands, neck   Basal cell carcinoma    Benign neoplasm of colon    Breast cancer of upper-outer quadrant of left female breast ([place]) 02/23/2015   4'16-left breast cancer-surgery, chemo, radiation-[name] follows every 3 months   Cataract    beginning stages-monitoring by MD   Cholelithiasis    resolved with surgery   Chronic systolic CHF (congestive heart failure) ([place]) 02/26/2016   A.  Echo 02/21/16:  Mild LVH, EF 20-25%, diff HK, Gr 3 DD, mod AI, mod to severe MR, mod LAE, mod redcued RVSF, mild  RAE, mod TR, PASP 53 mmHg, small pericardial effusion, mod L pleural effusion  //  B. Echo 6/17:  Mild LVH EF 30-35%, diffuse HK, grade 1 diastolic dysfunction, trivial AI, MAC, mild LAE, normal RVSF, trivial pericardial effusion     Cystocele    Diverticulitis    Diverticulosis of colon (without mention of hemorrhage)    Gout    resolved -   Hearing loss    some hearing loss in right ear- no hearing aids   Heart murmur    Hematuria    negative urology work up   History of blood transfusion 1971   x 1 with D&C -    Hx of abnormal cervical Pap smear    LEEP-CIN I, negative margins and ECC   Hypercalcemia    Hypertension    Mitral regurgitation    Neuromuscular disorder (HCC)    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   OA (osteoarthritis) of hip    right   Other issue of medical certificates    uterine myomata   Parathyroid abnormality ([place])    being followed Dr. Barbette Hair   Radiation 10/01/15-11/16/15   left breast axillary and supraclavicular reigion 45 gray, left breast 50.4 gray, lumpectomy cavity boost 10 gray   Renal disease 3/16   Stage 3 kidney disease-seeing Dr Buddy Duty and Dr Marval Regal   Uterine prolaps    resolved after surgery   Vitamin D deficiency    Past Surgical History:  Procedure Laterality Date   BREAST LUMPECTOMY Left 08/2015   CHOLECYSTECTOMY     COLONOSCOPY  01/18/2013   [name]   COLONOSCOPY N/A 06/26/2016   Procedure: COLONOSCOPY;  Surgeon: Irene Shipper, MD;  Location: [facility];  Service: Endoscopy;  Laterality: N/A;   CYSTOCELE REPAIR  08/2010   Rectocele, vault prolapse   DILATION AND CURETTAGE OF UTERUS     x2   PORT-A-CATH REMOVAL Right 08/23/2015   Procedure: REMOVAL PORT-A-CATH;  Surgeon: Erroll Luna, MD;  Location: [place];  Service: General;  Laterality: Right;   PORTACATH PLACEMENT Right 03/20/2015   Procedure: INSERTION PORT-A-CATH;  Surgeon: Erroll Luna, MD;  Location: [place];  Service: General;  Laterality: Right;    RADIOACTIVE SEED GUIDED PARTIAL MASTECTOMY WITH AXILLARY SENTINEL LYMPH NODE BIOPSY Left 08/23/2015   Procedure: LEFT BREAST PARTIAL MASTECTOMY WITH SENTINEL LYMPH NODE [place];  Surgeon: Erroll Luna, MD;  Location: [place];  Service: General;  Laterality: Left;   TONSILLECTOMY AND ADENOIDECTOMY     TOTAL VAGINAL HYSTERECTOMY     secondary to prolapse, ovaries not removed    Allergies  No Known Allergies   EKGs/Labs/Other Studies Reviewed:   The following studies were reviewed today:   CT Chest 12/18/2020: FINDINGS: Cardiovascular: No significant vascular findings. Normal heart size. No pericardial effusion.   Mediastinum/Nodes: No axillary supraclavicular adenopathy. No mediastinal adenopathy. Trachea and esophagus are normal.  Small hiatal hernia.   Lungs/Pleura: No pneumothorax or pulmonary contusion. No pleural fluid. No aspiration. Mild RIGHT lower lobe bronchial thickening.   Upper Abdomen: Limited view of the liver, kidneys, pancreas are unremarkable. Normal adrenal glands.   Musculoskeletal: No rib fracture.  No spine fracture   IMPRESSION: 1. No evidence of thoracic trauma. 2. Hiatal hernia. 3. Mild bronchitis in the RIGHT lower lobe   Echo TTE 01/21/2017: - Left ventricle: The cavity size was normal. Wall thickness was    normal. Systolic function was mildly reduced. The estimated    ejection fraction was in the range of 45% to 50%. Inferolateral    hypokinesis. Abnormal GLPSS at -14% wtih inferior strain    abnormality. Doppler parameters are consistent with abnormal left    ventricular relaxation (grade 1 diastolic dysfunction). The E/e&'    ratio is >15, suggesting elevated LV filling pressure.  - Aortic valve: Trileaflet. Sclerosis without stenosis. There was    trivial regurgitation.  - Left atrium: The atrium was normal in size.  - Inferior vena cava: The vessel was normal in size. The    respirophasic diameter changes were in the  normal range (>= 50%),    consistent with normal central venous pressure.   Impressions:  - Compared to a prior study in 2017, the LVEF has improved to    45-50% - there is inferior and inferolateral hypokinesis.   EKG:  EKG is  ordered today.  The ekg ordered today demonstrates normal sinus rhythm, rate 70 bpm.  Recent Labs: No results found for requested labs within last 365 days.  Recent Lipid Panel    Component Value Date/Time   CHOL 140 11/29/2020 1004   TRIG 127 11/29/2020 1004   HDL 54 11/29/2020 1004   CHOLHDL 2.6 11/29/2020 1004   LDLCALC 64 11/29/2020 1004     Home Medications   Current Meds  Medication Sig   B Complex Vitamins (VITAMIN B COMPLEX PO) Take 1 tablet by mouth daily.    calcium carbonate (TUMS) 500 MG chewable tablet Chew 2 tablets (400 mg of elemental calcium total) by mouth 3 times/day as needed-between meals & bedtime for indigestion or heartburn.   hyoscyamine (LEVSIN) 0.125 MG tablet TAKE 1-2 TABLET EVERY 4-6 HOURS AS NEEDED FOR ABDOMINAL PAIN   lidocaine (LIDODERM) 5 % Place 1 patch onto the skin daily. Remove & Discard patch within 12 hours or as directed by MD   lisinopril (ZESTRIL) 40 MG tablet Take 40 mg by mouth at bedtime.   loratadine (CLARITIN) 10 MG tablet Take 10 mg by mouth daily.   metroNIDAZOLE (METROCREAM) 0.75 % cream Apply 1 application topically daily.    spironolactone (ALDACTONE) 25 MG tablet Take 0.5 tablets (12.5 mg total) by mouth daily.   triamcinolone cream (KENALOG) 0.1 % SMARTSIG:1 Application Topical 2-3 Times Daily   [DISCONTINUED] carvedilol (COREG) 25 MG tablet Take 1 tablet (25 mg total) by mouth 2 (two) times daily. (Patient taking differently: Take 12.5 mg by mouth 2 (two) times daily.)   [DISCONTINUED] furosemide (LASIX) 20 MG tablet Take 1 tablet (20 mg total) by mouth daily. (Patient taking differently: Take 20 mg by mouth as needed.)     Review of Systems      All other systems reviewed and are otherwise  negative except as noted above.  Physical Exam    VS:  BP (!) 118/42   Pulse 70   Ht 4' 11.75" (1.518 m)   Wt 156 lb (70.8 kg)  LMP 10/20/1996   SpO2 95%   BMI 30.72 kg/m  , BMI Body mass index is 30.72 kg/m.  Wt Readings from Last 3 Encounters:  07/18/22 156 lb (70.8 kg)  04/29/22 154 lb (69.9 kg)  02/27/22 155 lb 3.2 oz (70.4 kg)     GEN: Well nourished, well developed, in no acute distress. HEENT: normal. Neck: Supple, no JVD, carotid bruits, or masses. Cardiac: RRR, no murmurs, rubs, or gallops. No clubbing, cyanosis, edema.  Radials/PT 2+ and equal bilaterally.  Respiratory:  Respirations regular and unlabored, clear to auscultation bilaterally. GI: Soft, nontender, nondistended. MS: No deformity or atrophy. Skin: Warm and dry, no rash. Neuro:  Strength and sensation are intact. Psych: Normal affect.  Assessment & Plan    Chronic systolic CHF -Euvolemic on exam -We changed her Lasix 40 mg daily to as needed for lower extremity edema -Continue spironolactone 12.5 mg at night  Essential hypertension -BP log reveals blood pressures in the morning and evenings elevated and then midday dropping low and this is when the patient feels her worst.  Blood pressure today 118/42 -We made her Lasix as needed and continued all other medications -continue to track BP  CKD -Most recent creatinine 1.19 -She is followed by nephrology  4. Syncope x 1 -update echocardiogram -She had recent labs per nephrology       Disposition: Follow up 3 months with Candee Furbish, MD or APP.  Signed, Elgie Collard, PA-C 07/18/2022, 4:47 PM [facility]

## 2022-07-21 ENCOUNTER — Ambulatory Visit: Payer: Medicare Other | Attending: Physician Assistant

## 2022-07-21 DIAGNOSIS — R293 Abnormal posture: Secondary | ICD-10-CM | POA: Diagnosis not present

## 2022-07-21 DIAGNOSIS — M6281 Muscle weakness (generalized): Secondary | ICD-10-CM | POA: Insufficient documentation

## 2022-07-21 DIAGNOSIS — M25511 Pain in right shoulder: Secondary | ICD-10-CM | POA: Diagnosis not present

## 2022-07-21 DIAGNOSIS — M25512 Pain in left shoulder: Secondary | ICD-10-CM | POA: Diagnosis not present

## 2022-07-21 NOTE — Therapy (Signed)
OUTPATIENT PHYSICAL THERAPY TREATMENT      Patient Name: Carla Bennett MRN: [id number] DOB:09/01/1935, 87 y.o., female Today's Date: 07/21/2022   PT End of Session - 07/21/22 1445     Visit Number 12    Number of Visits 20    Date for PT Re-Evaluation 08/20/22    Authorization Type Medicare + [employer] supplement    Progress Note Due on Visit 64    PT Start Time 1358    PT Stop Time 1443    PT Time Calculation (min) 45 min    Activity Tolerance Patient tolerated treatment well    Behavior During Therapy WFL for tasks assessed/performed                      Past Medical History:  Diagnosis Date   Allergy    Aortic regurgitation    Arthritis    hands, neck   Basal cell carcinoma    Benign neoplasm of colon    Breast cancer of upper-outer quadrant of left female breast ([place]) 02/23/2015   4'16-left breast cancer-surgery, chemo, radiation-[name] follows every 3 months   Cataract    beginning stages-monitoring by MD   Cholelithiasis    resolved with surgery   Chronic systolic CHF (congestive heart failure) ([place]) 02/26/2016   A.  Echo 02/21/16:  Mild LVH, EF 20-25%, diff HK, Gr 3 DD, mod AI, mod to severe MR, mod LAE, mod redcued RVSF, mild RAE, mod TR, PASP 53 mmHg, small pericardial effusion, mod L pleural effusion  //  B. Echo 6/17:  Mild LVH EF 30-35%, diffuse HK, grade 1 diastolic dysfunction, trivial AI, MAC, mild LAE, normal RVSF, trivial pericardial effusion     Cystocele    Diverticulitis    Diverticulosis of colon (without mention of hemorrhage)    Gout    resolved -   Hearing loss    some hearing loss in right ear- no hearing aids   Heart murmur    Hematuria    negative urology work up   History of blood transfusion 1971   x 1 with D&C -    Hx of abnormal cervical Pap smear    LEEP-CIN I, negative margins and ECC   Hypercalcemia    Hypertension    Mitral regurgitation    Neuromuscular disorder (HCC)    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   OA  (osteoarthritis) of hip    right   Other issue of medical certificates    uterine myomata   Parathyroid abnormality ([place])    being followed Dr. Barbette Hair   Radiation 10/01/15-11/16/15   left breast axillary and supraclavicular reigion 45 gray, left breast 50.4 gray, lumpectomy cavity boost 10 gray   Renal disease 3/16   Stage 3 kidney disease-seeing Dr Buddy Duty and Dr Marval Regal   Uterine prolaps    resolved after surgery   Vitamin D deficiency    Past Surgical History:  Procedure Laterality Date   BREAST LUMPECTOMY Left 08/2015   CHOLECYSTECTOMY     COLONOSCOPY  01/18/2013   [name]   COLONOSCOPY N/A 06/26/2016   Procedure: COLONOSCOPY;  Surgeon: Irene Shipper, MD;  [facility];  Service: Endoscopy;  Laterality: N/A;   CYSTOCELE REPAIR  08/2010   Rectocele, vault prolapse   DILATION AND CURETTAGE OF UTERUS     x2   PORT-A-CATH REMOVAL Right 08/23/2015   Procedure: REMOVAL PORT-A-CATH;  Surgeon: Erroll Luna, MD;  Location: [place];  Service: General;  Laterality: Right;   PORTACATH PLACEMENT Right 03/20/2015   Procedure: INSERTION PORT-A-CATH;  Surgeon: Erroll Luna, MD;  Location: [place];  Service: General;  Laterality: Right;   RADIOACTIVE SEED GUIDED PARTIAL MASTECTOMY WITH AXILLARY SENTINEL LYMPH NODE BIOPSY Left 08/23/2015   Procedure: LEFT BREAST PARTIAL MASTECTOMY WITH SENTINEL LYMPH NODE [place];  Surgeon: Erroll Luna, MD;  Location: [place];  Service: General;  Laterality: Left;   TONSILLECTOMY AND ADENOIDECTOMY     TOTAL VAGINAL HYSTERECTOMY     secondary to prolapse, ovaries not removed   Patient Active Problem List   Diagnosis Date Noted   Mixed hyperlipidemia 08/21/2021   Diverticulosis of colon without hemorrhage    Chronic systolic CHF (congestive heart failure) ([place]) 02/26/2016   Dilated cardiomyopathy ([place]) 02/26/2016   CKD (chronic kidney disease) 02/26/2016   Essential hypertension 07/26/2015   Family history of  malignant neoplasm of gastrointestinal tract 03/08/2015   Breast cancer of upper-outer quadrant of left female breast ([place]) 02/23/2015   Hematochezia 01/06/2013    PCP: Donald Prose, MD  REFERRING PROVIDER: Olen Cordial, PA  REFERRING DIAG: M25.511 (ICD-10-CM) -Bilateral shoulder pain  THERAPY DIAG:  Acute pain of left shoulder  Acute pain of right shoulder  Abnormal posture  Muscle weakness (generalized)  Rationale for Evaluation and Treatment Rehabilitation  ONSET DATE: 5 weeks ago  SUBJECTIVE:                                                                                                                                                                                      SUBJECTIVE STATEMENT: just a little pain in shoulders. The hand is making it more noticeable today.    PERTINENT HISTORY: PMH: basal cell carcinoma, breast cancer, CHF, diverticulitis, hearing loss, neuropathy of feet due to chemotherapy, R hip OA, HTN, bil hand OA  PAIN:  Are you having pain? Yes: NPRS scale: 3/10 Pain location: both shoulders Pain description: it's just there   PRECAUTIONS: Other: history of L sided breast cancer, keep resistance low.   Reporting history of orthostatic hypotension.   WEIGHT BEARING RESTRICTIONS No  FALLS:  Has patient fallen in last 6 months? No  LIVING ENVIRONMENT: Lives with: lives alone Lives in: House/apartment Stairs: No Has following equipment at home: Grab bars  OCCUPATION: Retired   PLOF: Independent  PATIENT GOALS "Be able to clean house like I used too pain free"  OBJECTIVE:   DIAGNOSTIC FINDINGS:  Cervical MRI 12/18/2020 IMPRESSION: 1. No acute fracture or acute intracranial findings. 2. Right facial soft tissue swelling overlying the maxilla. Probable soft tissue swelling along the chin eccentric to the right. 3. Periventricular  white matter and corona radiata hypodensities favor chronic ischemic microvascular white matter  disease. 4. Cervical spondylosis and degenerative disc disease causing multilevel osseous foraminal impingement. 5. Left common carotid atherosclerotic calcification. 6. Chronic paranasal sinusitis. Acute on chronic bilateral maxillary sinusitis.  PATIENT SURVEYS:  Quick Dash 56.8% disability   COGNITION:  Overall cognitive status: Within functional limits for tasks assessed     SENSATION: Slightly diminished L thumb  POSTURE: Forward head posture, rounded shoulder  CERVICAL ROM:  AROM eval 07/16/22  Cervical Flexion 40 43  Cervical Extension 45 49  Cervical Rotation to Left 70 65  Cervical Rotation to Right 70 65  Left Sidebend 40 39  Right Sidebend 30 31  No pain just tightness with movement.   UPPER EXTREMITY ROM:   full PROM bil,  AROM limited by pain.   Active ROM Right*  eval Left eval Right 07/14/22 Left 07/14/22  Shoulder flexion 120 125* 159 145  Shoulder extension 75 75    Shoulder abduction 120 125 155 157  Shoulder internal rotation  Functional T12 Functional T12 FIR to T9 FIR to T7  Shoulder external rotation Functional T2 Functional T2 FER to T2 FER to T2  Elbow flexion      Elbow extension      Wrist flexion      Wrist extension      (Blank rows = not tested)  UPPER EXTREMITY MMT:  MMT Right eval Left eval Left 07/14/22 Right 07/14/22  Shoulder flexion 4* 4* 4+ 4+  Shoulder extension      Shoulder abduction 4* 4* 4+ 4  Shoulder adduction      Shoulder internal rotation _0 Shoulder external rotation _1 Elbow flexion 5 5    Elbow extension 5 5    Wrist flexion      Wrist extension 5 5    Wrist ulnar deviation      Wrist radial deviation      Wrist pronation      Wrist supination      Grip strength  good good    (Blank rows = not tested)  *pain  SHOULDER SPECIAL TESTS:  Painful arc bil  Negative spurling  JOINT MOBILITY TESTING:  Full PROM, no capsular tightness  PALPATION:  No pain with palpation bil shoulders.  Noted  increased cervical lordosis and hypomobility, tightness anterior scalenes, levator, upper trap, and cervical paraspinals.     TODAY'S TREATMENT:  07/21/22 Therapeutic Exercise: UBE L2 3 min fwd/ 3 min back Wall wash flexion x 10 bil Wall angels x 10 - very difficult only to ~ 60 deg Wall open books x 5 R side: 10x L side - more tight on L Shoulder press 1# weights x 10 Seated horizontal ABD yellow TB x10 Seated ER yellow TB x 10 Bil Grip squeezes with small blue  squish ball x 10 Wrist extensions x 10 1# wt Wrist curls x 10 1# wt  07/16/22 TherEx: UBE L2.0 3 min fwd/ 3 min back Putty squeeze yellow theraputy x 10; 3 finger pinch with yellow putty x 10 Cervical SNAGs rotation with pillowcase 6x bil Seated shoulder flexion with cane x 10 Grip strength: L UE: 20.3lb; R UE: 7.8lb  07/14/22 TherEx: UBE L1.5 3 min fwd/ 3 min back Assessment of: AROM MMT Quick Dash: 27.3% Consulted with patient about progress and remaining POC    PATIENT EDUCATION: Education details: HEP update 07/21/22- wall angels, open books  Person educated:  Patient Education method: Explanation, Demonstration, Verbal cues, and Handouts Education comprehension: verbalized understanding and returned demonstration   HOME EXERCISE PROGRAM: Access Code: [email or web address]  ASSESSMENT:  CLINICAL IMPRESSION:    Pt arrived with no new complaints. Continued with progression of postural facilitation and grip strength. She was very challenged with the wall angels and open books to the L side, thus added these to HEP. Pt did well with interventions other than some difficulty with wrist exercises. She would continue to benefit from working on grip strength and shoulder mobility to improve function.   OBJECTIVE IMPAIRMENTS decreased activity tolerance, decreased ROM, decreased strength, increased fascial restrictions, impaired perceived functional ability, increased muscle spasms, impaired sensation, impaired UE functional use,  postural dysfunction, and pain.   ACTIVITY LIMITATIONS carrying, lifting, bending, sleeping, bed mobility, dressing, reach over head, and hygiene/grooming  PARTICIPATION LIMITATIONS: cleaning and laundry  PERSONAL FACTORS Age and 3+ comorbidities: h/o breast cancer, CHF, diverticulitis, hearing loss, neuropathy of feet due to chemotherapy, R hip OA, HTN, bil hand OA  are also affecting patient's functional outcome.   REHAB POTENTIAL: Good  CLINICAL DECISION MAKING: Evolving/moderate complexity  EVALUATION COMPLEXITY: Moderate   GOALS: Goals reviewed with patient? Yes  SHORT TERM GOALS: Target date: 06/25/2022   Patient will be independent with initial HEP.  Baseline: given Goal status: MET 06/19/22 - reports good compliance and performing 2-3x daily.    LONG TERM GOALS: Target date: 07/23/2022 extended to 08/20/2022.   Patient will be independent with advanced/ongoing HEP to improve outcomes and carryover.  Baseline: needs progression Goal status: IN PROGRESS  2.  Patient will report 75% improvement in bil shoulder pain to improve QOL.  Baseline: 6-9/10 Goal status: MET - 07/09/22 (80-90% improvement)  3.  Patient to demonstrate improved upright posture with posterior shoulder girdle engaged to promote improved glenohumeral joint mobility. Baseline: forward head posture Goal status: MET - 07/14/22   4.  Patient to improve bil shoulder AROM to [facility] without pain provocation to allow for increased ease of ADLs.  Baseline: see objective,  AROM limited by pain Goal status: IN PROGRESS  5.  Patient will demonstrate improved functional UE strength as demonstrated by 5/5 bil shoulder strength. Baseline: 4/5 bil limited by pain Goal status: IN PROGRESS 07/14/2022 4+/5 but not painful.   6  Patient will report <40% impairment on quickDash  to demonstrate improved functional ability.  Baseline: 56.8% impairment.  Goal status: MET - 07/14/22  7.  Patient will be able to read without  numbness/tingling in hands.    Baseline: increases, difficulty holding hardback books Goal status: IN PROGRESS 07/02/22- improving, places pillow in lap to improve posture and decreased numbness in hands 07/16/2022- gets L hand numbness driving.   8. Patient will report decreased bil shoulder pain in mornings to be able to use arms to get out of bed.  Baseline: has to swing legs out, cant use arms Goal status: IN PROGRESS 07/02/2022 - significant improvement, "just a teeny struggle now"    PLAN: PT FREQUENCY: 1-2x/week  PT DURATION: 6 weeks extending 4 weeks to 08/20/2022  PLANNED INTERVENTIONS: Therapeutic exercises, Therapeutic activity, Neuromuscular re-education, Balance training, Gait training, Patient/Family education, Self Care, Joint mobilization, Dry Needling, Electrical stimulation, Spinal mobilization, Cryotherapy, Moist heat, Traction, Ultrasound, Ionotophoresis 17m/ml Dexamethasone, Manual therapy, and Re-evaluation  PLAN FOR NEXT SESSION: grip strengthening; postural strengthening, neck ROM and stretching, manual therapy to decrease tightness cervical spine, modalities PRN, may trial traction.   BArtist Pais PTA 07/21/2022, 2:46 PM

## 2022-07-22 ENCOUNTER — Other Ambulatory Visit: Payer: Self-pay | Admitting: *Deleted

## 2022-07-22 DIAGNOSIS — N184 Chronic kidney disease, stage 4 (severe): Secondary | ICD-10-CM

## 2022-07-22 DIAGNOSIS — I10 Essential (primary) hypertension: Secondary | ICD-10-CM

## 2022-07-22 NOTE — Progress Notes (Signed)
Spoke with patient and she agrees with this plan. Order placed and she is aware that she will receive a call to schedule.

## 2022-07-24 ENCOUNTER — Encounter: Payer: Self-pay | Admitting: Physical Therapy

## 2022-07-24 ENCOUNTER — Ambulatory Visit: Payer: Medicare Other | Admitting: Physical Therapy

## 2022-07-24 DIAGNOSIS — M25511 Pain in right shoulder: Secondary | ICD-10-CM | POA: Diagnosis not present

## 2022-07-24 DIAGNOSIS — R293 Abnormal posture: Secondary | ICD-10-CM

## 2022-07-24 DIAGNOSIS — M6281 Muscle weakness (generalized): Secondary | ICD-10-CM | POA: Diagnosis not present

## 2022-07-24 DIAGNOSIS — M25512 Pain in left shoulder: Secondary | ICD-10-CM | POA: Diagnosis not present

## 2022-07-24 NOTE — Therapy (Signed)
OUTPATIENT PHYSICAL THERAPY TREATMENT      Patient Name: Carla Bennett MRN: [id number] DOB:1935/02/03, 87 y.o., female Today's Date: 07/24/2022   PT End of Session - 07/24/22 1403     Visit Number 13    Number of Visits 20    Date for PT Re-Evaluation 08/20/22    Authorization Type Medicare + [employer] supplement    Progress Note Due on Visit 47    PT Start Time 1400    PT Stop Time 1445    PT Time Calculation (min) 45 min    Activity Tolerance Patient tolerated treatment well    Behavior During Therapy WFL for tasks assessed/performed                      Past Medical History:  Diagnosis Date   Allergy    Aortic regurgitation    Arthritis    hands, neck   Basal cell carcinoma    Benign neoplasm of colon    Breast cancer of upper-outer quadrant of left female breast ([place]) 02/23/2015   4'16-left breast cancer-surgery, chemo, radiation-[name] follows every 3 months   Cataract    beginning stages-monitoring by MD   Cholelithiasis    resolved with surgery   Chronic systolic CHF (congestive heart failure) ([place]) 02/26/2016   A.  Echo 02/21/16:  Mild LVH, EF 20-25%, diff HK, Gr 3 DD, mod AI, mod to severe MR, mod LAE, mod redcued RVSF, mild RAE, mod TR, PASP 53 mmHg, small pericardial effusion, mod L pleural effusion  //  B. Echo 6/17:  Mild LVH EF 30-35%, diffuse HK, grade 1 diastolic dysfunction, trivial AI, MAC, mild LAE, normal RVSF, trivial pericardial effusion     Cystocele    Diverticulitis    Diverticulosis of colon (without mention of hemorrhage)    Gout    resolved -   Hearing loss    some hearing loss in right ear- no hearing aids   Heart murmur    Hematuria    negative urology work up   History of blood transfusion 1971   x 1 with D&C -    Hx of abnormal cervical Pap smear    LEEP-CIN I, negative margins and ECC   Hypercalcemia    Hypertension    Mitral regurgitation    Neuromuscular disorder (HCC)    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   OA  (osteoarthritis) of hip    right   Other issue of medical certificates    uterine myomata   Parathyroid abnormality ([place])    being followed Dr. Barbette Hair   Radiation 10/01/15-11/16/15   left breast axillary and supraclavicular reigion 45 gray, left breast 50.4 gray, lumpectomy cavity boost 10 gray   Renal disease 3/16   Stage 3 kidney disease-seeing Dr Buddy Duty and Dr Marval Regal   Uterine prolaps    resolved after surgery   Vitamin D deficiency    Past Surgical History:  Procedure Laterality Date   BREAST LUMPECTOMY Left 08/2015   CHOLECYSTECTOMY     COLONOSCOPY  01/18/2013   [name]   COLONOSCOPY N/A 06/26/2016   Procedure: COLONOSCOPY;  Surgeon: Irene Shipper, MD;  Location: [facility];  Service: Endoscopy;  Laterality: N/A;   CYSTOCELE REPAIR  08/2010   Rectocele, vault prolapse   DILATION AND CURETTAGE OF UTERUS     x2   PORT-A-CATH REMOVAL Right 08/23/2015   Procedure: REMOVAL PORT-A-CATH;  Surgeon: Erroll Luna, MD;  Location: [facility];  Service: General;  Laterality: Right;   PORTACATH PLACEMENT Right 03/20/2015   Procedure: INSERTION PORT-A-CATH;  Surgeon: Erroll Luna, MD;  Location: [facility];  Service: General;  Laterality: Right;   RADIOACTIVE SEED GUIDED PARTIAL MASTECTOMY WITH AXILLARY SENTINEL LYMPH NODE BIOPSY Left 08/23/2015   Procedure: LEFT BREAST PARTIAL MASTECTOMY WITH SENTINEL LYMPH NODE MAPPING;  Surgeon: Erroll Luna, MD;  Location: [facility];  Service: General;  Laterality: Left;   TONSILLECTOMY AND ADENOIDECTOMY     TOTAL VAGINAL HYSTERECTOMY     secondary to prolapse, ovaries not removed   Patient Active Problem List   Diagnosis Date Noted   Mixed hyperlipidemia 08/21/2021   Diverticulosis of colon without hemorrhage    Chronic systolic CHF (congestive heart failure) ([place]) 02/26/2016   Dilated cardiomyopathy ([place]) 02/26/2016   CKD (chronic kidney disease) 02/26/2016   Essential hypertension 07/26/2015   Family history of  malignant neoplasm of gastrointestinal tract 03/08/2015   Breast cancer of upper-outer quadrant of left female breast ([place]) 02/23/2015   Hematochezia 01/06/2013    PCP: Donald Prose, MD  REFERRING PROVIDER: Olen Cordial, PA  REFERRING DIAG: M25.511 (ICD-10-CM) -Bilateral shoulder pain  THERAPY DIAG:  Acute pain of left shoulder  Acute pain of right shoulder  Abnormal posture  Muscle weakness (generalized)  Rationale for Evaluation and Treatment Rehabilitation  ONSET DATE: 5 weeks ago  SUBJECTIVE:                                                                                                                                                                                      SUBJECTIVE STATEMENT: Patient reports neck and shoulder are feeling good, hand still gets numb, but feels the putty is very helpful although sometimes painful.   Going to [facility].    PERTINENT HISTORY: PMH: basal cell carcinoma, breast cancer, CHF, diverticulitis, hearing loss, neuropathy of feet due to chemotherapy, R hip OA, HTN, bil hand OA  PAIN:  Are you having pain? Yes: NPRS scale: 3/10 Pain location: both shoulders Pain description: it's just there   PRECAUTIONS: Other: history of L sided breast cancer, keep resistance low.   Reporting history of orthostatic hypotension.   WEIGHT BEARING RESTRICTIONS No  FALLS:  Has patient fallen in last 6 months? No  LIVING ENVIRONMENT: Lives with: lives alone Lives in: House/apartment Stairs: No Has following equipment at home: Grab bars  OCCUPATION: Retired   PLOF: Independent  PATIENT GOALS "Be able to clean house like I used too pain free"  OBJECTIVE:   DIAGNOSTIC FINDINGS:  Cervical MRI 12/18/2020 IMPRESSION: 1. No acute fracture or acute intracranial findings. 2. Right facial soft tissue swelling overlying the  maxilla. Probable soft tissue swelling along the chin eccentric to the right. 3. Periventricular white matter and  corona radiata hypodensities favor chronic ischemic microvascular white matter disease. 4. Cervical spondylosis and degenerative disc disease causing multilevel osseous foraminal impingement. 5. Left common carotid atherosclerotic calcification. 6. Chronic paranasal sinusitis. Acute on chronic bilateral maxillary sinusitis.  PATIENT SURVEYS:  Quick Dash 56.8% disability   COGNITION:  Overall cognitive status: Within functional limits for tasks assessed     SENSATION: Slightly diminished L thumb  POSTURE: Forward head posture, rounded shoulder  CERVICAL ROM:  AROM eval 07/16/22  Cervical Flexion 40 43  Cervical Extension 45 49  Cervical Rotation to Left 70 65  Cervical Rotation to Right 70 65  Left Sidebend 40 39  Right Sidebend 30 31  No pain just tightness with movement.   UPPER EXTREMITY ROM:   full PROM bil,  AROM limited by pain.   Active ROM Right*  eval Left eval Right 07/14/22 Left 07/14/22  Shoulder flexion 120 125* 159 145  Shoulder extension 75 75    Shoulder abduction 120 125 155 157  Shoulder internal rotation  Functional T12 Functional T12 FIR to T9 FIR to T7  Shoulder external rotation Functional T2 Functional T2 FER to T2 FER to T2  Elbow flexion      Elbow extension      Wrist flexion      Wrist extension      (Blank rows = not tested)  UPPER EXTREMITY MMT:  MMT Right eval Left eval Left 07/14/22 Right 07/14/22  Shoulder flexion 4* 4* 4+ 4+  Shoulder extension      Shoulder abduction 4* 4* 4+ 4  Shoulder adduction      Shoulder internal rotation _0 Shoulder external rotation _1 Elbow flexion 5 5    Elbow extension 5 5    Wrist flexion      Wrist extension 5 5    Wrist ulnar deviation      Wrist radial deviation      Wrist pronation      Wrist supination      Grip strength  good good    (Blank rows = not tested)  *pain  SHOULDER SPECIAL TESTS:  Painful arc bil  Negative spurling  JOINT MOBILITY TESTING:  Full PROM, no  capsular tightness  PALPATION:  No pain with palpation bil shoulders.  Noted increased cervical lordosis and hypomobility, tightness anterior scalenes, levator, upper trap, and cervical paraspinals.     TODAY'S TREATMENT:  07/24/2022 Therapeutic Exercise: to improve strength and mobility.   UBE L2 x 6 mi n([id number]) Thoracic extension glides - Y-arms facing wall x 10  Ulnar nerve walks 2 x 5 bil Open books x 10 bil  Review of HEP Manual Therapy: to decrease muscle spasm and pain and improve mobility STM/TPR to cervical paraspinals, R UT, PA mobs cervical spine, NAGS into rotation, Side glides, gentle cervical traction.   07/21/22 Therapeutic Exercise: UBE L2 3 min fwd/ 3 min back Wall wash flexion x 10 bil Wall angels x 10 - very difficult only to ~ 60 deg Wall open books x 5 R side: 10x L side - more tight on L Shoulder press 1# weights x 10 Seated horizontal ABD yellow TB x10 Seated ER yellow TB x 10 Bil Grip squeezes with small blue  squish ball x 10 Wrist extensions x 10 1# wt Wrist curls x 10 1# wt  07/16/22 TherEx:  UBE L2.0 3 min fwd/ 3 min back Putty squeeze yellow theraputy x 10; 3 finger pinch with yellow putty x 10 Cervical SNAGs rotation with pillowcase 6x bil Seated shoulder flexion with cane x 10 Grip strength: L UE: 20.3lb; R UE: 7.8lb   PATIENT EDUCATION: Education details: HEP update 07/24/22- discontinued wall angels, added ulnar walks and Y-arms  Person educated: Patient Education method: Consulting civil engineer, Demonstration, Verbal cues, and Handouts Education comprehension: verbalized understanding and returned demonstration   HOME EXERCISE PROGRAM: Access Code: [email or web address]  ASSESSMENT:  CLINICAL IMPRESSION: Carla Bennett reports minimal R shoulder pain, but still having numbness in R hand, compliant with exercises but reported that the wall angels were painful.  Reviewed exercises focusing on education to perform in pain free ROM, added thoracic extension at  wall and discontinued wall angel, followed by manual therapy to cervical spine, noted large TrP in R levator, and cervical multifidi, discussed dry needling but holding off until after trip due to risk of soreness.  Also strongly recommended wearing mask while travelling and bringing her own pillow.  Carla Bennett continues to demonstrate potential for improvement and would benefit from continued skilled therapy to address impairments.     OBJECTIVE IMPAIRMENTS decreased activity tolerance, decreased ROM, decreased strength, increased fascial restrictions, impaired perceived functional ability, increased muscle spasms, impaired sensation, impaired UE functional use, postural dysfunction, and pain.   ACTIVITY LIMITATIONS carrying, lifting, bending, sleeping, bed mobility, dressing, reach over head, and hygiene/grooming  PARTICIPATION LIMITATIONS: cleaning and laundry  PERSONAL FACTORS Age and 3+ comorbidities: h/o breast cancer, CHF, diverticulitis, hearing loss, neuropathy of feet due to chemotherapy, R hip OA, HTN, bil hand OA  are also affecting patient's functional outcome.   REHAB POTENTIAL: Good  CLINICAL DECISION MAKING: Evolving/moderate complexity  EVALUATION COMPLEXITY: Moderate   GOALS: Goals reviewed with patient? Yes  SHORT TERM GOALS: Target date: 06/25/2022   Patient will be independent with initial HEP.  Baseline: given Goal status: MET 06/19/22 - reports good compliance and performing 2-3x daily.    LONG TERM GOALS: Target date: 07/23/2022 extended to 08/20/2022.   Patient will be independent with advanced/ongoing HEP to improve outcomes and carryover.  Baseline: needs progression Goal status: IN PROGRESS  2.  Patient will report 75% improvement in bil shoulder pain to improve QOL.  Baseline: 6-9/10 Goal status: MET - 07/09/22 (80-90% improvement)  3.  Patient to demonstrate improved upright posture with posterior shoulder girdle engaged to promote improved  glenohumeral joint mobility. Baseline: forward head posture Goal status: MET - 07/14/22   4.  Patient to improve bil shoulder AROM to [facility] without pain provocation to allow for increased ease of ADLs.  Baseline: see objective,  AROM limited by pain Goal status: IN PROGRESS  5.  Patient will demonstrate improved functional UE strength as demonstrated by 5/5 bil shoulder strength. Baseline: 4/5 bil limited by pain Goal status: IN PROGRESS 07/14/2022 4+/5 but not painful.   6  Patient will report <40% impairment on quickDash  to demonstrate improved functional ability.  Baseline: 56.8% impairment.  Goal status: MET - 07/14/22 27.3%  7.  Patient will be able to read without numbness/tingling in hands.    Baseline: increases, difficulty holding hardback books Goal status: IN PROGRESS 07/02/22- improving, places pillow in lap to improve posture and decreased numbness in hands 07/16/2022- gets L hand numbness driving.   8. Patient will report decreased bil shoulder pain in mornings to be able to use arms to get out of bed.  Baseline: has to swing legs out, cant use arms Goal status: IN PROGRESS 07/02/2022 - significant improvement, "just a teeny struggle now"    PLAN: PT FREQUENCY: 1-2x/week  PT DURATION: 6 weeks extending 4 weeks to 08/20/2022  PLANNED INTERVENTIONS: Therapeutic exercises, Therapeutic activity, Neuromuscular re-education, Balance training, Gait training, Patient/Family education, Self Care, Joint mobilization, Dry Needling, Electrical stimulation, Spinal mobilization, Cryotherapy, Moist heat, Traction, Ultrasound, Ionotophoresis 56m/ml Dexamethasone, Manual therapy, and Re-evaluation  PLAN FOR NEXT SESSION: grip strengthening; postural strengthening, neck ROM and stretching, manual therapy to decrease tightness cervical spine, modalities PRN, trial DN  ERennie Natter PT, DPT 07/24/2022, 2:57 PM

## 2022-07-28 ENCOUNTER — Encounter: Payer: Medicare Other | Admitting: Physical Therapy

## 2022-07-30 ENCOUNTER — Encounter: Payer: Self-pay | Admitting: Physical Therapy

## 2022-07-30 ENCOUNTER — Ambulatory Visit: Payer: Medicare Other | Admitting: Physical Therapy

## 2022-07-30 DIAGNOSIS — M6281 Muscle weakness (generalized): Secondary | ICD-10-CM | POA: Diagnosis not present

## 2022-07-30 DIAGNOSIS — M25512 Pain in left shoulder: Secondary | ICD-10-CM | POA: Diagnosis not present

## 2022-07-30 DIAGNOSIS — M25511 Pain in right shoulder: Secondary | ICD-10-CM | POA: Diagnosis not present

## 2022-07-30 DIAGNOSIS — R293 Abnormal posture: Secondary | ICD-10-CM | POA: Diagnosis not present

## 2022-07-30 NOTE — Therapy (Signed)
OUTPATIENT PHYSICAL THERAPY TREATMENT      Patient Name: Carla Bennett MRN: [id number] DOB:03-13-1935, 87 y.o., female Today's Date: 07/30/2022   PT End of Session - 07/30/22 1446     Visit Number 14    Number of Visits 20    Date for PT Re-Evaluation 08/20/22    Authorization Type Medicare + [employer] supplement    Progress Note Due on Visit 49    PT Start Time 1445    PT Stop Time 1528    PT Time Calculation (min) 43 min    Activity Tolerance Patient tolerated treatment well    Behavior During Therapy WFL for tasks assessed/performed                      Past Medical History:  Diagnosis Date   Allergy    Aortic regurgitation    Arthritis    hands, neck   Basal cell carcinoma    Benign neoplasm of colon    Breast cancer of upper-outer quadrant of left female breast ([place]) 02/23/2015   4'16-left breast cancer-surgery, chemo, radiation-[name] follows every 3 months   Cataract    beginning stages-monitoring by MD   Cholelithiasis    resolved with surgery   Chronic systolic CHF (congestive heart failure) ([place]) 02/26/2016   A.  Echo 02/21/16:  Mild LVH, EF 20-25%, diff HK, Gr 3 DD, mod AI, mod to severe MR, mod LAE, mod redcued RVSF, mild RAE, mod TR, PASP 53 mmHg, small pericardial effusion, mod L pleural effusion  //  B. Echo 6/17:  Mild LVH EF 30-35%, diffuse HK, grade 1 diastolic dysfunction, trivial AI, MAC, mild LAE, normal RVSF, trivial pericardial effusion     Cystocele    Diverticulitis    Diverticulosis of colon (without mention of hemorrhage)    Gout    resolved -   Hearing loss    some hearing loss in right ear- no hearing aids   Heart murmur    Hematuria    negative urology work up   History of blood transfusion 1971   x 1 with D&C -    Hx of abnormal cervical Pap smear    LEEP-CIN I, negative margins and ECC   Hypercalcemia    Hypertension    Mitral regurgitation    Neuromuscular disorder (HCC)    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   OA  (osteoarthritis) of hip    right   Other issue of medical certificates    uterine myomata   Parathyroid abnormality ([place])    being followed Dr. Barbette Hair   Radiation 10/01/15-11/16/15   left breast axillary and supraclavicular reigion 45 gray, left breast 50.4 gray, lumpectomy cavity boost 10 gray   Renal disease 3/16   Stage 3 kidney disease-seeing Dr Buddy Duty and Dr Marval Regal   Uterine prolaps    resolved after surgery   Vitamin D deficiency    Past Surgical History:  Procedure Laterality Date   BREAST LUMPECTOMY Left 08/2015   CHOLECYSTECTOMY     COLONOSCOPY  01/18/2013   [name]   COLONOSCOPY N/A 06/26/2016   Procedure: COLONOSCOPY;  Surgeon: Irene Shipper, MD;  Location: [facility];  Service: Endoscopy;  Laterality: N/A;   CYSTOCELE REPAIR  08/2010   Rectocele, vault prolapse   DILATION AND CURETTAGE OF UTERUS     x2   PORT-A-CATH REMOVAL Right 08/23/2015   Procedure: REMOVAL PORT-A-CATH;  Surgeon: Erroll Luna, MD;  Location: [facility];  Service: General;  Laterality: Right;   PORTACATH PLACEMENT Right 03/20/2015   Procedure: INSERTION PORT-A-CATH;  Surgeon: Erroll Luna, MD;  Location: [facility];  Service: General;  Laterality: Right;   RADIOACTIVE SEED GUIDED PARTIAL MASTECTOMY WITH AXILLARY SENTINEL LYMPH NODE BIOPSY Left 08/23/2015   Procedure: LEFT BREAST PARTIAL MASTECTOMY WITH SENTINEL LYMPH NODE MAPPING;  Surgeon: Erroll Luna, MD;  Location: [facility];  Service: General;  Laterality: Left;   TONSILLECTOMY AND ADENOIDECTOMY     TOTAL VAGINAL HYSTERECTOMY     secondary to prolapse, ovaries not removed   Patient Active Problem List   Diagnosis Date Noted   Mixed hyperlipidemia 08/21/2021   Diverticulosis of colon without hemorrhage    Chronic systolic CHF (congestive heart failure) ([place]) 02/26/2016   Dilated cardiomyopathy ([place]) 02/26/2016   CKD (chronic kidney disease) 02/26/2016   Essential hypertension 07/26/2015   Family history of  malignant neoplasm of gastrointestinal tract 03/08/2015   Breast cancer of upper-outer quadrant of left female breast ([place]) 02/23/2015   Hematochezia 01/06/2013    PCP: Donald Prose, MD  REFERRING PROVIDER: Olen Cordial, PA  REFERRING DIAG: M25.511 (ICD-10-CM) -Bilateral shoulder pain  THERAPY DIAG:  Acute pain of left shoulder  Acute pain of right shoulder  Abnormal posture  Muscle weakness (generalized)  Rationale for Evaluation and Treatment Rehabilitation  ONSET DATE: 5 weeks ago  SUBJECTIVE:                                                                                                                                                                                      SUBJECTIVE STATEMENT: Had a great trip to [place], still tired from trip, fell asleep sitting up on couch just before therapy so feeling a little stiff.   Numbness seems to be improving a little bit.   Has been using putty 2-3x/day which helps.   PERTINENT HISTORY: PMH: basal cell carcinoma, breast cancer, CHF, diverticulitis, hearing loss, neuropathy of feet due to chemotherapy, R hip OA, HTN, bil hand OA  PAIN:  Are you having pain? Yes: NPRS scale: 1/10 Pain location: both shoulders Pain description: it's just there   PRECAUTIONS: Other: history of L sided breast cancer, keep resistance low.   Reporting history of orthostatic hypotension.   WEIGHT BEARING RESTRICTIONS No  FALLS:  Has patient fallen in last 6 months? No  LIVING ENVIRONMENT: Lives with: lives alone Lives in: House/apartment Stairs: No Has following equipment at home: Grab bars  OCCUPATION: Retired   PLOF: Independent  PATIENT GOALS "Be able to clean house like I used too pain free"  OBJECTIVE:   DIAGNOSTIC FINDINGS:  Cervical MRI 12/18/2020 IMPRESSION: 1. No  acute fracture or acute intracranial findings. 2. Right facial soft tissue swelling overlying the maxilla. Probable soft tissue swelling along the chin  eccentric to the right. 3. Periventricular white matter and corona radiata hypodensities favor chronic ischemic microvascular white matter disease. 4. Cervical spondylosis and degenerative disc disease causing multilevel osseous foraminal impingement. 5. Left common carotid atherosclerotic calcification. 6. Chronic paranasal sinusitis. Acute on chronic bilateral maxillary sinusitis.  PATIENT SURVEYS:  Quick Dash 56.8% disability   COGNITION:  Overall cognitive status: Within functional limits for tasks assessed     SENSATION: Slightly diminished L thumb  POSTURE: Forward head posture, rounded shoulder  CERVICAL ROM:  AROM eval 07/16/22  Cervical Flexion 40 43  Cervical Extension 45 49  Cervical Rotation to Left 70 65  Cervical Rotation to Right 70 65  Left Sidebend 40 39  Right Sidebend 30 31  No pain just tightness with movement.   UPPER EXTREMITY ROM:   full PROM bil,  AROM limited by pain.   Active ROM Right*  eval Left eval Right 07/14/22 Left 07/14/22  Shoulder flexion 120 125* 159 145  Shoulder extension 75 75    Shoulder abduction 120 125 155 157  Shoulder internal rotation  Functional T12 Functional T12 FIR to T9 FIR to T7  Shoulder external rotation Functional T2 Functional T2 FER to T2 FER to T2  Elbow flexion      Elbow extension      Wrist flexion      Wrist extension      (Blank rows = not tested)  UPPER EXTREMITY MMT:  MMT Right eval Left eval Left 07/14/22 Right 07/14/22  Shoulder flexion 4* 4* 4+ 4+  Shoulder extension      Shoulder abduction 4* 4* 4+ 4  Shoulder adduction      Shoulder internal rotation _0 Shoulder external rotation _1 Elbow flexion 5 5    Elbow extension 5 5    Wrist flexion      Wrist extension 5 5    Wrist ulnar deviation      Wrist radial deviation      Wrist pronation      Wrist supination      Grip strength  good good    (Blank rows = not tested)  *pain  SHOULDER SPECIAL TESTS:  Painful arc  bil  Negative spurling  JOINT MOBILITY TESTING:  Full PROM, no capsular tightness  PALPATION:  No pain with palpation bil shoulders.  Noted increased cervical lordosis and hypomobility, tightness anterior scalenes, levator, upper trap, and cervical paraspinals.     TODAY'S TREATMENT:  07/30/2022 Therapeutic Exercise: to improve strength and mobility.  UBE L2 x 6 min (60f3b) Thoracic extension glides - Y-arms facing wall x 10  Ulnar nerve walks 2 x 5 bil Open books x 10 bil  Review of HEP Manual Therapy: to decrease muscle spasm and pain and improve mobility STM/TPR to cervical paraspinals, R UT, PA mobs cervical spine, NAGS into rotation, Side glides, gentle cervical traction.   Supine on MHP for patient comfort during manual therapy.   07/24/2022 Therapeutic Exercise: to improve strength and mobility.   UBE L2 x 6 mi n(329fb) Thoracic extension glides - Y-arms facing wall x 10  Ulnar nerve walks 2 x 5 bil Open books x 10 bil  Review of HEP Manual Therapy: to decrease muscle spasm and pain and improve mobility STM/TPR to cervical paraspinals, R UT, PA mobs cervical spine, NAGS  into rotation, Side glides, gentle cervical traction.   07/21/22 Therapeutic Exercise: UBE L2 3 min fwd/ 3 min back Wall wash flexion x 10 bil Wall angels x 10 - very difficult only to ~ 60 deg Wall open books x 5 R side: 10x L side - more tight on L Shoulder press 1# weights x 10 Seated horizontal ABD yellow TB x10 Seated ER yellow TB x 10 Bil Grip squeezes with small blue  squish ball x 10 Wrist extensions x 10 1# wt Wrist curls x 10 1# wt  07/16/22 TherEx: UBE L2.0 3 min fwd/ 3 min back Putty squeeze yellow theraputy x 10; 3 finger pinch with yellow putty x 10 Cervical SNAGs rotation with pillowcase 6x bil Seated shoulder flexion with cane x 10 Grip strength: L UE: 20.3lb; R UE: 7.8lb   PATIENT EDUCATION: Education details: HEP update 07/24/22- discontinued wall angels, added ulnar walks and  Y-arms  Person educated: Patient Education method: Explanation, Demonstration, Verbal cues, and Handouts Education comprehension: verbalized understanding and returned demonstration   HOME EXERCISE PROGRAM: Access Code: [email or web address]  ASSESSMENT:  CLINICAL IMPRESSION: [place] continues to report improvement in R shoulder pain and hand numbness.  Reviewed exercises today followed by manual therapy to decrease tightness in neck, reported improvement following.   Teri L Desautel continues to demonstrate potential for improvement and would benefit from continued skilled therapy to address impairments.     OBJECTIVE IMPAIRMENTS decreased activity tolerance, decreased ROM, decreased strength, increased fascial restrictions, impaired perceived functional ability, increased muscle spasms, impaired sensation, impaired UE functional use, postural dysfunction, and pain.   ACTIVITY LIMITATIONS carrying, lifting, bending, sleeping, bed mobility, dressing, reach over head, and hygiene/grooming  PARTICIPATION LIMITATIONS: cleaning and laundry  PERSONAL FACTORS Age and 3+ comorbidities: h/o breast cancer, CHF, diverticulitis, hearing loss, neuropathy of feet due to chemotherapy, R hip OA, HTN, bil hand OA  are also affecting patient's functional outcome.   REHAB POTENTIAL: Good  CLINICAL DECISION MAKING: Evolving/moderate complexity  EVALUATION COMPLEXITY: Moderate   GOALS: Goals reviewed with patient? Yes  SHORT TERM GOALS: Target date: 06/25/2022   Patient will be independent with initial HEP.  Baseline: given Goal status: MET 06/19/22 - reports good compliance and performing 2-3x daily.    LONG TERM GOALS: Target date: 07/23/2022 extended to 08/20/2022.   Patient will be independent with advanced/ongoing HEP to improve outcomes and carryover.  Baseline: needs progression Goal status: IN PROGRESS  2.  Patient will report 75% improvement in bil shoulder pain to improve QOL.   Baseline: 6-9/10 Goal status: MET - 07/09/22 (80-90% improvement)  3.  Patient to demonstrate improved upright posture with posterior shoulder girdle engaged to promote improved glenohumeral joint mobility. Baseline: forward head posture Goal status: MET - 07/14/22   4.  Patient to improve bil shoulder AROM to [facility] without pain provocation to allow for increased ease of ADLs.  Baseline: see objective,  AROM limited by pain Goal status: IN PROGRESS  5.  Patient will demonstrate improved functional UE strength as demonstrated by 5/5 bil shoulder strength. Baseline: 4/5 bil limited by pain Goal status: IN PROGRESS 07/14/2022 4+/5 but not painful.   6  Patient will report <40% impairment on quickDash  to demonstrate improved functional ability.  Baseline: 56.8% impairment.  Goal status: MET - 07/14/22 27.3%  7.  Patient will be able to read without numbness/tingling in hands.    Baseline: increases, difficulty holding hardback books Goal status: IN PROGRESS 07/02/22- improving, places pillow in  lap to improve posture and decreased numbness in hands 07/16/2022- gets L hand numbness driving.   8. Patient will report decreased bil shoulder pain in mornings to be able to use arms to get out of bed.  Baseline: has to swing legs out, cant use arms Goal status: IN PROGRESS 07/02/2022 - significant improvement, "just a teeny struggle now"    PLAN: PT FREQUENCY: 1-2x/week  PT DURATION: 6 weeks extending 4 weeks to 08/20/2022  PLANNED INTERVENTIONS: Therapeutic exercises, Therapeutic activity, Neuromuscular re-education, Balance training, Gait training, Patient/Family education, Self Care, Joint mobilization, Dry Needling, Electrical stimulation, Spinal mobilization, Cryotherapy, Moist heat, Traction, Ultrasound, Ionotophoresis 18m/ml Dexamethasone, Manual therapy, and Re-evaluation  PLAN FOR NEXT SESSION: grip strengthening; postural strengthening, neck ROM and stretching, manual therapy to decrease  tightness cervical spine, modalities PRN, trial DN  ERennie Natter PT, DPT 07/30/2022, 5:59 PM

## 2022-08-04 ENCOUNTER — Ambulatory Visit (HOSPITAL_COMMUNITY): Payer: Medicare Other | Attending: Physician Assistant

## 2022-08-04 DIAGNOSIS — R55 Syncope and collapse: Secondary | ICD-10-CM | POA: Diagnosis not present

## 2022-08-04 LAB — ECHOCARDIOGRAM COMPLETE
Area-P 1/2: 5.97 cm2
P 1/2 time: 2123 msec
S' Lateral: 2.6 cm

## 2022-08-06 ENCOUNTER — Ambulatory Visit: Payer: Medicare Other | Admitting: Physical Therapy

## 2022-08-06 ENCOUNTER — Encounter: Payer: Self-pay | Admitting: Physical Therapy

## 2022-08-06 DIAGNOSIS — R293 Abnormal posture: Secondary | ICD-10-CM | POA: Diagnosis not present

## 2022-08-06 DIAGNOSIS — M6281 Muscle weakness (generalized): Secondary | ICD-10-CM | POA: Diagnosis not present

## 2022-08-06 DIAGNOSIS — M25511 Pain in right shoulder: Secondary | ICD-10-CM

## 2022-08-06 DIAGNOSIS — M25512 Pain in left shoulder: Secondary | ICD-10-CM | POA: Diagnosis not present

## 2022-08-06 NOTE — Therapy (Signed)
OUTPATIENT PHYSICAL THERAPY TREATMENT      Patient Name: Carla Bennett MRN: [id number] DOB:[birth year], 87 y.o., female Today's Date: 08/06/2022   PT End of Session - 08/06/22 1450     Visit Number 15    Number of Visits 20    Date for PT Re-Evaluation 08/20/22    Authorization Type Medicare + [employer] supplement    Progress Note Due on Visit 69    PT Start Time 1446    PT Stop Time 1528    PT Time Calculation (min) 42 min    Activity Tolerance Patient tolerated treatment well    Behavior During Therapy WFL for tasks assessed/performed                      Past Medical History:  Diagnosis Date   Allergy    Aortic regurgitation    Arthritis    hands, neck   Basal cell carcinoma    Benign neoplasm of colon    Breast cancer of upper-outer quadrant of left female breast ([place]) 02/23/2015   4'16-left breast cancer-surgery, chemo, radiation-[name] follows every 3 months   Cataract    beginning stages-monitoring by MD   Cholelithiasis    resolved with surgery   Chronic systolic CHF (congestive heart failure) ([place]) 02/26/2016   A.  Echo 02/21/16:  Mild LVH, EF 20-25%, diff HK, Gr 3 DD, mod AI, mod to severe MR, mod LAE, mod redcued RVSF, mild RAE, mod TR, PASP 53 mmHg, small pericardial effusion, mod L pleural effusion  //  B. Echo 6/17:  Mild LVH EF 30-35%, diffuse HK, grade 1 diastolic dysfunction, trivial AI, MAC, mild LAE, normal RVSF, trivial pericardial effusion     Cystocele    Diverticulitis    Diverticulosis of colon (without mention of hemorrhage)    Gout    resolved -   Hearing loss    some hearing loss in right ear- no hearing aids   Heart murmur    Hematuria    negative urology work up   History of blood transfusion 1971   x 1 with D&C -    Hx of abnormal cervical Pap smear    LEEP-CIN I, negative margins and ECC   Hypercalcemia    Hypertension    Mitral regurgitation    Neuromuscular disorder (HCC)    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   OA  (osteoarthritis) of hip    right   Other issue of medical certificates    uterine myomata   Parathyroid abnormality ([place])    being followed Dr. Barbette Hair   Radiation 10/01/15-11/16/15   left breast axillary and supraclavicular reigion 45 gray, left breast 50.4 gray, lumpectomy cavity boost 10 gray   Renal disease 3/16   Stage 3 kidney disease-seeing Dr Buddy Duty and Dr Marval Regal   Uterine prolaps    resolved after surgery   Vitamin D deficiency    Past Surgical History:  Procedure Laterality Date   BREAST LUMPECTOMY Left 08/2015   CHOLECYSTECTOMY     COLONOSCOPY  01/18/2013   [name]   COLONOSCOPY N/A 06/26/2016   Procedure: COLONOSCOPY;  Surgeon: Irene Shipper, MD;  Location: [facility];  Service: Endoscopy;  Laterality: N/A;   CYSTOCELE REPAIR  08/2010   Rectocele, vault prolapse   DILATION AND CURETTAGE OF UTERUS     x2   PORT-A-CATH REMOVAL Right 08/23/2015   Procedure: REMOVAL PORT-A-CATH;  Surgeon: Erroll Luna, MD;  Location: [facility];  Service: General;  Laterality: Right;   PORTACATH PLACEMENT Right 03/20/2015   Procedure: INSERTION PORT-A-CATH;  Surgeon: Erroll Luna, MD;  Location: [facility];  Service: General;  Laterality: Right;   RADIOACTIVE SEED GUIDED PARTIAL MASTECTOMY WITH AXILLARY SENTINEL LYMPH NODE BIOPSY Left 08/23/2015   Procedure: LEFT BREAST PARTIAL MASTECTOMY WITH SENTINEL LYMPH NODE MAPPING;  Surgeon: Erroll Luna, MD;  Location: [facility];  Service: General;  Laterality: Left;   TONSILLECTOMY AND ADENOIDECTOMY     TOTAL VAGINAL HYSTERECTOMY     secondary to prolapse, ovaries not removed   Patient Active Problem List   Diagnosis Date Noted   Mixed hyperlipidemia 08/21/2021   Diverticulosis of colon without hemorrhage    Chronic systolic CHF (congestive heart failure) ([place]) 02/26/2016   Dilated cardiomyopathy ([place]) 02/26/2016   CKD (chronic kidney disease) 02/26/2016   Essential hypertension 07/26/2015   Family history of  malignant neoplasm of gastrointestinal tract 03/08/2015   Breast cancer of upper-outer quadrant of left female breast ([place]) 02/23/2015   Hematochezia 01/06/2013    PCP: Donald Prose, MD  REFERRING PROVIDER: Olen Cordial, PA  REFERRING DIAG: M25.511 (ICD-10-CM) -Bilateral shoulder pain  THERAPY DIAG:  Acute pain of left shoulder  Acute pain of right shoulder  Abnormal posture  Muscle weakness (generalized)  Rationale for Evaluation and Treatment Rehabilitation  ONSET DATE: 5 weeks ago  SUBJECTIVE:                                                                                                                                                                                      SUBJECTIVE STATEMENT: Carla Bennett reports she is doing well.  Tingling is getting better in hand.  R shoulder is stiff today, thinks she slept on it funny.  PERTINENT HISTORY: PMH: basal cell carcinoma, breast cancer, CHF, diverticulitis, hearing loss, neuropathy of feet due to chemotherapy, R hip OA, HTN, bil hand OA  PAIN:  Are you having pain? Yes: NPRS scale: 1/10 Pain location: both shoulders Pain description: it's just there   PRECAUTIONS: Other: history of L sided breast cancer, keep resistance low.   Reporting history of orthostatic hypotension.   WEIGHT BEARING RESTRICTIONS No  FALLS:  Has patient fallen in last 6 months? No  LIVING ENVIRONMENT: Lives with: lives alone Lives in: House/apartment Stairs: No Has following equipment at home: Grab bars  OCCUPATION: Retired   PLOF: Independent  PATIENT GOALS "Be able to clean house like I used too pain free"  OBJECTIVE:   DIAGNOSTIC FINDINGS:  Cervical MRI 12/18/2020 IMPRESSION: 1. No acute fracture or acute intracranial findings. 2. Right facial soft tissue swelling overlying the maxilla. Probable soft  tissue swelling along the chin eccentric to the right. 3. Periventricular white matter and corona radiata  hypodensities favor chronic ischemic microvascular white matter disease. 4. Cervical spondylosis and degenerative disc disease causing multilevel osseous foraminal impingement. 5. Left common carotid atherosclerotic calcification. 6. Chronic paranasal sinusitis. Acute on chronic bilateral maxillary sinusitis.  PATIENT SURVEYS:  Quick Dash 56.8% disability   COGNITION:  Overall cognitive status: Within functional limits for tasks assessed     SENSATION: Slightly diminished L thumb  POSTURE: Forward head posture, rounded shoulder  CERVICAL ROM:  AROM eval 07/16/22  Cervical Flexion 40 43  Cervical Extension 45 49  Cervical Rotation to Left 70 65  Cervical Rotation to Right 70 65  Left Sidebend 40 39  Right Sidebend 30 31  No pain just tightness with movement.   UPPER EXTREMITY ROM:   full PROM bil,  AROM limited by pain.   Active ROM Right*  eval Left eval Right 07/14/22 Left 07/14/22  Shoulder flexion 120 125* 159 145  Shoulder extension 75 75    Shoulder abduction 120 125 155 157  Shoulder internal rotation  Functional T12 Functional T12 FIR to T9 FIR to T7  Shoulder external rotation Functional T2 Functional T2 FER to T2 FER to T2  Elbow flexion      Elbow extension      Wrist flexion      Wrist extension      (Blank rows = not tested)  UPPER EXTREMITY MMT:  MMT Right eval Left eval Left 07/14/22 Right 07/14/22  Shoulder flexion 4* 4* 4+ 4+  Shoulder extension      Shoulder abduction 4* 4* 4+ 4  Shoulder adduction      Shoulder internal rotation _0 Shoulder external rotation _1 Elbow flexion 5 5    Elbow extension 5 5    Wrist flexion      Wrist extension 5 5    Wrist ulnar deviation      Wrist radial deviation      Wrist pronation      Wrist supination      Grip strength  good good    (Blank rows = not tested)  *pain  SHOULDER SPECIAL TESTS:  Painful arc bil  Negative spurling  JOINT MOBILITY TESTING:  Full PROM, no capsular  tightness  PALPATION:  No pain with palpation bil shoulders.  Noted increased cervical lordosis and hypomobility, tightness anterior scalenes, levator, upper trap, and cervical paraspinals.     TODAY'S TREATMENT:  08/06/2022 Therapeutic Exercise: to improve strength and mobility.  Demo, verbal and tactile cues throughout for technique. UBE L2 x 6 min (46f3b) Unable to y-arms today due to pain in R shoulder Table slides x 15 flexion, x 10 abduction R R wrist extension 1# 2 x 10  R wrist flexion 1# 2 x 10 Shoulder rolls retro 2 x 10  Wall slides scaption x 10 bil - improved R shoulder ROM Manual Therapy: to decrease muscle spasm and pain and improve mobility STM/TPR to cervical paraspinals, R UT, PA mobs cervical spine, NAGS into rotation, Side glides, gentle cervical traction.   07/30/2022 Therapeutic Exercise: to improve strength and mobility.  UBE L2 x 6 min (328fb) Thoracic extension glides - Y-arms facing wall x 10  Ulnar nerve walks 2 x 5 bil Open books x 10 bil  Review of HEP Manual Therapy: to decrease muscle spasm and pain and improve mobility STM/TPR to cervical paraspinals, R UT,  PA mobs cervical spine, NAGS into rotation, Side glides, gentle cervical traction.   Supine on MHP for patient comfort during manual therapy.   07/24/2022 Therapeutic Exercise: to improve strength and mobility.   UBE L2 x 6 mi n(53f3b) Thoracic extension glides - Y-arms facing wall x 10  Ulnar nerve walks 2 x 5 bil Open books x 10 bil  Review of HEP Manual Therapy: to decrease muscle spasm and pain and improve mobility STM/TPR to cervical paraspinals, R UT, PA mobs cervical spine, NAGS into rotation, Side glides, gentle cervical traction.   07/21/22 Therapeutic Exercise: UBE L2 3 min fwd/ 3 min back Wall wash flexion x 10 bil Wall angels x 10 - very difficult only to ~ 60 deg Wall open books x 5 R side: 10x L side - more tight on L Shoulder press 1# weights x 10 Seated horizontal ABD yellow  TB x10 Seated ER yellow TB x 10 Bil Grip squeezes with small blue  squish ball x 10 Wrist extensions x 10 1# wt Wrist curls x 10 1# wt  07/16/22 TherEx: UBE L2.0 3 min fwd/ 3 min back Putty squeeze yellow theraputy x 10; 3 finger pinch with yellow putty x 10 Cervical SNAGs rotation with pillowcase 6x bil Seated shoulder flexion with cane x 10 Grip strength: L UE: 20.3lb; R UE: 7.8lb   PATIENT EDUCATION: Education details: HEP update 07/24/22- discontinued wall angels, added ulnar walks and Y-arms  Person educated: Patient Education method: EConsulting civil engineer Demonstration, Verbal cues, and Handouts Education comprehension: verbalized understanding and returned demonstration   HOME EXERCISE PROGRAM: Access Code: [email or web address]  ASSESSMENT:  CLINICAL IMPRESSION: Carla Bennett demonstrated decreased R shoulder ROM and pain today with wall slides, so had her perform table slides to help loosen shoulder before returning to wall with decreased tightness.  To continue to do table slides at home.  Reported decreased pain and tightness following manual therapy.   Carla Bennett continues to demonstrate potential for improvement and would benefit from continued skilled therapy to address impairments.     OBJECTIVE IMPAIRMENTS decreased activity tolerance, decreased ROM, decreased strength, increased fascial restrictions, impaired perceived functional ability, increased muscle spasms, impaired sensation, impaired UE functional use, postural dysfunction, and pain.   ACTIVITY LIMITATIONS carrying, lifting, bending, sleeping, bed mobility, dressing, reach over head, and hygiene/grooming  PARTICIPATION LIMITATIONS: cleaning and laundry  PERSONAL FACTORS Age and 3+ comorbidities: h/o breast cancer, CHF, diverticulitis, hearing loss, neuropathy of feet due to chemotherapy, R hip OA, HTN, bil hand OA  are also affecting patient's functional outcome.   REHAB POTENTIAL: Good  CLINICAL DECISION  MAKING: Evolving/moderate complexity  EVALUATION COMPLEXITY: Moderate   GOALS: Goals reviewed with patient? Yes  SHORT TERM GOALS: Target date: 06/25/2022   Patient will be independent with initial HEP.  Baseline: given Goal status: MET 06/19/22 - reports good compliance and performing 2-3x daily.    LONG TERM GOALS: Target date: 07/23/2022 extended to 08/20/2022.   Patient will be independent with advanced/ongoing HEP to improve outcomes and carryover.  Baseline: needs progression Goal status: IN PROGRESS  2.  Patient will report 75% improvement in bil shoulder pain to improve QOL.  Baseline: 6-9/10 Goal status: MET - 07/09/22 (80-90% improvement)  3.  Patient to demonstrate improved upright posture with posterior shoulder girdle engaged to promote improved glenohumeral joint mobility. Baseline: forward head posture Goal status: MET - 07/14/22   4.  Patient to improve bil shoulder [facility]OM [facility] to allow for  increased ease of ADLs.  Baseline: see objective,  AROM limited by pain Goal status: IN PROGRESS  5.  Patient will demonstrate improved functional UE strength as demonstrated by 5/5 bil shoulder strength. Baseline: 4/5 bil limited by pain Goal status: IN PROGRESS 07/14/2022 4+/5 but not painful.   6  Patient will report <40% impairment on quickDash  to demonstrate improved functional ability.  Baseline: 56.8% impairment.  Goal status: MET - 07/14/22 27.3%  7.  Patient will be able to read without numbness/tingling in hands.    Baseline: increases, difficulty holding hardback books Goal status: IN PROGRESS 07/02/22- improving, places pillow in lap to improve posture and decreased numbness in hands 07/16/2022- gets L hand numbness driving.   8. Patient will report decreased bil shoulder pain in mornings to be able to use arms to get out of bed.  Baseline: has to swing legs out, cant use arms Goal status: IN PROGRESS 07/02/2022 - significant improvement,  "just a teeny struggle now"    PLAN: PT FREQUENCY: 1-2x/week  PT DURATION: 6 weeks extending 4 weeks to 08/20/2022  PLANNED INTERVENTIONS: Therapeutic exercises, Therapeutic activity, Neuromuscular re-education, Balance training, Gait training, Patient/Family education, Self Care, Joint mobilization, Dry Needling, Electrical stimulation, Spinal mobilization, Cryotherapy, Moist heat, Traction, Ultrasound, Ionotophoresis 53m/ml Dexamethasone, Manual therapy, and Re-evaluation  PLAN FOR NEXT SESSION: grip strengthening; postural strengthening, neck ROM and stretching, manual therapy to decrease tightness cervical spine, modalities PRN, trial DN  ERennie Natter PT, DPT 08/06/2022, 3:33 PM

## 2022-08-07 ENCOUNTER — Telehealth: Payer: Self-pay | Admitting: Cardiology

## 2022-08-07 NOTE — Telephone Encounter (Signed)
Pt would like a callback regarding Test that is scheduled for tomorrow. She has questions, please advise

## 2022-08-08 ENCOUNTER — Ambulatory Visit (HOSPITAL_COMMUNITY)
Admission: RE | Admit: 2022-08-08 | Discharge: 2022-08-08 | Disposition: A | Discharge status: Home / Self Care | Payer: Medicare Other | Source: Ambulatory Visit | Attending: Cardiovascular Disease | Admitting: Cardiovascular Disease

## 2022-08-08 ENCOUNTER — Encounter: Payer: Self-pay | Admitting: *Deleted

## 2022-08-08 DIAGNOSIS — N184 Chronic kidney disease, stage 4 (severe): Secondary | ICD-10-CM | POA: Diagnosis not present

## 2022-08-08 DIAGNOSIS — I10 Essential (primary) hypertension: Secondary | ICD-10-CM | POA: Insufficient documentation

## 2022-08-08 NOTE — Telephone Encounter (Signed)
See result note, attempted to reach patient x2. Was able to leave a message for pt to call back.

## 2022-08-11 ENCOUNTER — Ambulatory Visit: Payer: Medicare Other

## 2022-08-11 DIAGNOSIS — M25512 Pain in left shoulder: Secondary | ICD-10-CM | POA: Diagnosis not present

## 2022-08-11 DIAGNOSIS — M25511 Pain in right shoulder: Secondary | ICD-10-CM

## 2022-08-11 DIAGNOSIS — M6281 Muscle weakness (generalized): Secondary | ICD-10-CM

## 2022-08-11 DIAGNOSIS — R293 Abnormal posture: Secondary | ICD-10-CM

## 2022-08-11 NOTE — Therapy (Signed)
OUTPATIENT PHYSICAL THERAPY TREATMENT      Patient Name: Carla Bennett MRN: [id number] DOB:04/14/1935, 87 y.o., female Today's Date: 08/11/2022   PT End of Session - 08/11/22 1531     Visit Number 16    Number of Visits 20    Date for PT Re-Evaluation 08/20/22    Authorization Type Medicare + [employer] supplement    Progress Note Due on Visit 3    PT Start Time 1456   pt late   PT Stop Time 1529    PT Time Calculation (min) 33 min    Activity Tolerance Patient tolerated treatment well    Behavior During Therapy WFL for tasks assessed/performed                       Past Medical History:  Diagnosis Date   Allergy    Aortic regurgitation    Arthritis    hands, neck   Basal cell carcinoma    Benign neoplasm of colon    Breast cancer of upper-outer quadrant of left female breast ([place]) 02/23/2015   4'16-left breast cancer-surgery, chemo, radiation-Carla Bennett follows every 3 months   Cataract    beginning stages-monitoring by MD   Cholelithiasis    resolved with surgery   Chronic systolic CHF (congestive heart failure) ([place]) 02/26/2016   A.  Echo 02/21/16:  Mild LVH, EF 20-25%, diff HK, Gr 3 DD, mod AI, mod to severe MR, mod LAE, mod redcued RVSF, mild RAE, mod TR, PASP 53 mmHg, small pericardial effusion, mod L pleural effusion  //  B. Echo 6/17:  Mild LVH EF 30-35%, diffuse HK, grade 1 diastolic dysfunction, trivial AI, MAC, mild LAE, normal RVSF, trivial pericardial effusion     Cystocele    Diverticulitis    Diverticulosis of colon (without mention of hemorrhage)    Gout    resolved -   Hearing loss    some hearing loss in right ear- no hearing aids   Heart murmur    Hematuria    negative urology work up   History of blood transfusion 1971   x 1 with D&C -    Hx of abnormal cervical Pap smear    LEEP-CIN I, negative margins and ECC   Hypercalcemia    Hypertension    Mitral regurgitation    Neuromuscular disorder (HCC)    mild neuropathy in feet due to  chemo.   OA (osteoarthritis) of hip    right   Other issue of medical certificates    uterine myomata   Parathyroid abnormality (Carla Bennett)    being followed Carla Bennett   Radiation 10/01/15-11/16/15   left breast axillary and supraclavicular reigion 45 gray, left breast 50.4 gray, lumpectomy cavity boost 10 gray   Renal disease 3/16   Stage 3 kidney disease-seeing Dr Carla Bennett and Dr Carla Bennett   Uterine prolaps    resolved after surgery   Vitamin D deficiency    Past Surgical History:  Procedure Laterality Date   BREAST LUMPECTOMY Left 08/2015   CHOLECYSTECTOMY     COLONOSCOPY  01/18/2013   Carla Bennett   COLONOSCOPY N/A 06/26/2016   Procedure: COLONOSCOPY;  Surgeon: Carla Shipper, MD;  Location: [facility];  Service: Endoscopy;  Laterality: N/A;   CYSTOCELE REPAIR  08/2010   Rectocele, vault prolapse   DILATION AND CURETTAGE OF UTERUS     x2   PORT-A-CATH REMOVAL Right 08/23/2015   Procedure: REMOVAL PORT-A-CATH;  Surgeon: Carla Luna, MD;  Location:  [place];  Service: General;  Laterality: Right;   PORTACATH PLACEMENT Right 03/20/2015   Procedure: INSERTION PORT-A-CATH;  Surgeon: Carla Luna, MD;  Location: [place];  Service: General;  Laterality: Right;   RADIOACTIVE SEED GUIDED PARTIAL MASTECTOMY WITH AXILLARY SENTINEL LYMPH NODE BIOPSY Left 08/23/2015   Procedure: LEFT BREAST PARTIAL MASTECTOMY WITH SENTINEL LYMPH NODE MAPPING;  Surgeon: Carla Luna, MD;  Location: [place];  Service: General;  Laterality: Left;   TONSILLECTOMY AND ADENOIDECTOMY     TOTAL VAGINAL HYSTERECTOMY     secondary to prolapse, ovaries not removed   Patient Active Problem List   Diagnosis Date Noted   Mixed hyperlipidemia 08/21/2021   Diverticulosis of colon without hemorrhage    Chronic systolic CHF (congestive heart failure) ([place]) 02/26/2016   Dilated cardiomyopathy ([place]) 02/26/2016   CKD (chronic kidney disease) 02/26/2016   Essential hypertension 07/26/2015   Family  history of malignant neoplasm of gastrointestinal tract 03/08/2015   Breast cancer of upper-outer quadrant of left female breast ([name]) 02/23/2015   Hematochezia 01/06/2013    PCP: Carla Prose, MD  REFERRING PROVIDER: Olen Cordial, PA  REFERRING DIAG: M25.511 (ICD-10-CM) -Bilateral shoulder pain  THERAPY DIAG:  Acute pain of left shoulder  Acute pain of right shoulder  Abnormal posture  Muscle weakness (generalized)  Rationale for Evaluation and Treatment Rehabilitation  ONSET DATE: 5 weeks ago  SUBJECTIVE:                                                                                                                                                                                      SUBJECTIVE STATEMENT: A little tight in the neck and shoulders today. She recently found a good pillow that prevented her from waking up with neck pain.  PERTINENT HISTORY: PMH: basal cell carcinoma, breast cancer, CHF, diverticulitis, hearing loss, neuropathy of feet due to chemotherapy, R hip OA, HTN, bil hand OA  PAIN:  Are you having pain? Yes: NPRS scale: 1/10 Pain location: both shoulders Pain description: sore   PRECAUTIONS: Other: history of L sided breast cancer, keep resistance low.   Reporting history of orthostatic hypotension.   WEIGHT BEARING RESTRICTIONS No  FALLS:  Has patient fallen in last 6 months? No  LIVING ENVIRONMENT: Lives with: lives alone Lives in: House/apartment Stairs: No Has following equipment at home: Grab bars  OCCUPATION: Retired   PLOF: Independent  PATIENT GOALS "Be able to clean house like I used too pain free"  OBJECTIVE:   DIAGNOSTIC FINDINGS:  Cervical MRI 12/18/2020 IMPRESSION: 1. No acute fracture or acute intracranial findings. 2. Right facial soft tissue swelling overlying the maxilla. Probable soft  tissue swelling along the chin eccentric to the right. 3. Periventricular white matter and corona radiata hypodensities favor  chronic ischemic microvascular white matter disease. 4. Cervical spondylosis and degenerative disc disease causing multilevel osseous foraminal impingement. 5. Left common carotid atherosclerotic calcification. 6. Chronic paranasal sinusitis. Acute on chronic bilateral maxillary sinusitis.  PATIENT SURVEYS:  Quick Dash 56.8% disability   COGNITION:  Overall cognitive status: Within functional limits for tasks assessed     SENSATION: Slightly diminished L thumb  POSTURE: Forward head posture, rounded shoulder  CERVICAL ROM:  AROM eval 07/16/22  Cervical Flexion 40 43  Cervical Extension 45 49  Cervical Rotation to Left 70 65  Cervical Rotation to Right 70 65  Left Sidebend 40 39  Right Sidebend 30 31  No pain just tightness with movement.   UPPER EXTREMITY ROM:   full PROM bil,  AROM limited by pain.   Active ROM Right*  eval Left eval Right 07/14/22 Left 07/14/22  Shoulder flexion 120 125* 159 145  Shoulder extension 75 75    Shoulder abduction 120 125 155 157  Shoulder internal rotation  Functional T12 Functional T12 FIR to T9 FIR to T7  Shoulder external rotation Functional T2 Functional T2 FER to T2 FER to T2  Elbow flexion      Elbow extension      Wrist flexion      Wrist extension      (Blank rows = not tested)  UPPER EXTREMITY MMT:  MMT Right eval Left eval Left 07/14/22 Right 07/14/22  Shoulder flexion 4* 4* 4+ 4+  Shoulder extension      Shoulder abduction 4* 4* 4+ 4  Shoulder adduction      Shoulder internal rotation _0 Shoulder external rotation _1 Elbow flexion 5 5    Elbow extension 5 5    Wrist flexion      Wrist extension 5 5    Wrist ulnar deviation      Wrist radial deviation      Wrist pronation      Wrist supination      Grip strength  good good    (Blank rows = not tested)  *pain  SHOULDER SPECIAL TESTS:  Painful arc bil  Negative spurling  JOINT MOBILITY TESTING:  Full PROM, no capsular tightness  PALPATION:   No pain with palpation bil shoulders.  Noted increased cervical lordosis and hypomobility, tightness anterior scalenes, levator, upper trap, and cervical paraspinals.     TODAY'S TREATMENT:  08/11/2022 Therapeutic Exercise: to improve strength and mobility.  Demo, verbal and tactile cues throughout for technique. UBE L2 x 6 min (64f3b) UT stretch 2x30 sec bil Levator scapulae stretch 2x30" bil Seated R wrist extension 1# 2x15 Seated R wrist flexion 1# 2x15 Seated R supination/pronation 1# x 10 Manual Therapy: to decrease muscle spasm and pain and improve mobility STM and TPR to B UT, LS, rhomboids, cervical paraspinals  08/06/2022 Therapeutic Exercise: to improve strength and mobility.  Demo, verbal and tactile cues throughout for technique. UBE L2 x 6 min (317fb) Unable to y-arms today due to pain in R shoulder Table slides x 15 flexion, x 10 abduction R R wrist extension 1# 2 x 10  R wrist flexion 1# 2 x 10 Shoulder rolls retro 2 x 10  Wall slides scaption x 10 bil - improved R shoulder ROM Manual Therapy: to decrease muscle spasm and pain and improve mobility STM/TPR to cervical paraspinals, R  UT, PA mobs cervical spine, NAGS into rotation, Side glides, gentle cervical traction.   07/30/2022 Therapeutic Exercise: to improve strength and mobility.  UBE L2 x 6 min (40f3b) Thoracic extension glides - Y-arms facing wall x 10  Ulnar nerve walks 2 x 5 bil Open books x 10 bil  Review of HEP Manual Therapy: to decrease muscle spasm and pain and improve mobility STM/TPR to cervical paraspinals, R UT, PA mobs cervical spine, NAGS into rotation, Side glides, gentle cervical traction.   Supine on MHP for patient comfort during manual therapy.     PATIENT EDUCATION: Education details: HEP update 07/24/22- discontinued wall angels, added ulnar walks and Y-arms  Person educated: Patient Education method: Explanation, Demonstration, Verbal cues, and Handouts Education comprehension:  verbalized understanding and returned demonstration   HOME EXERCISE PROGRAM: Access Code: [email or web address]  ASSESSMENT:  CLINICAL IMPRESSION: Pt arrived late today so session was limited. She was more tight in the UT and levator area today, however with MT she made improvements with neck mobility. Followed with neck stretches to increase mobility and also worked on hAnimator Pt still reports having difficulty opening jars on her own. She had no issues with the interventions today.   OBJECTIVE IMPAIRMENTS decreased activity tolerance, decreased ROM, decreased strength, increased fascial restrictions, impaired perceived functional ability, increased muscle spasms, impaired sensation, impaired UE functional use, postural dysfunction, and pain.   ACTIVITY LIMITATIONS carrying, lifting, bending, sleeping, bed mobility, dressing, reach over head, and hygiene/grooming  PARTICIPATION LIMITATIONS: cleaning and laundry  PERSONAL FACTORS Age and 3+ comorbidities: h/o breast cancer, CHF, diverticulitis, hearing loss, neuropathy of feet due to chemotherapy, R hip OA, HTN, bil hand OA  are also affecting patient's functional outcome.   REHAB POTENTIAL: Good  CLINICAL DECISION MAKING: Evolving/moderate complexity  EVALUATION COMPLEXITY: Moderate   GOALS: Goals reviewed with patient? Yes  SHORT TERM GOALS: Target date: 06/25/2022   Patient will be independent with initial HEP.  Baseline: given Goal status: MET 06/19/22 - reports good compliance and performing 2-3x daily.    LONG TERM GOALS: Target date: 07/23/2022 extended to 08/20/2022.   Patient will be independent with advanced/ongoing HEP to improve outcomes and carryover.  Baseline: needs progression Goal status: IN PROGRESS  2.  Patient will report 75% improvement in bil shoulder pain to improve QOL.  Baseline: 6-9/10 Goal status: MET - 07/09/22 (80-90% improvement)  3.  Patient to demonstrate improved upright posture with  posterior shoulder girdle engaged to promote improved glenohumeral joint mobility. Baseline: forward head posture Goal status: MET - 07/14/22   4.  Patient to improve bil shoulder [facility]OM to [facility] to allow for increased ease of ADLs.  Baseline: see objective,  AROM limited by pain Goal status: IN PROGRESS  5.  Patient will demonstrate improved functional UE strength as demonstrated by 5/5 bil shoulder strength. Baseline: 4/5 bil limited by pain Goal status: IN PROGRESS 07/14/2022 4+/5 but not painful.   6  Patient will report <40% impairment on quickDash  to demonstrate improved functional ability.  Baseline: 56.8% impairment.  Goal status: MET - 07/14/22 27.3%  7.  Patient will be able to read without numbness/tingling in hands.    Baseline: increases, difficulty holding hardback books Goal status: IN PROGRESS 07/02/22- improving, places pillow in lap to improve posture and decreased numbness in hands 07/16/2022- gets L hand numbness driving.   8. Patient will report decreased bil shoulder pain in mornings to be able to use arms to get out of  bed.  Baseline: has to swing legs out, cant use arms Goal status: IN PROGRESS 07/02/2022 - significant improvement, "just a teeny struggle now"    PLAN: PT FREQUENCY: 1-2x/week  PT DURATION: 6 weeks extending 4 weeks to 08/20/2022  PLANNED INTERVENTIONS: Therapeutic exercises, Therapeutic activity, Neuromuscular re-education, Balance training, Gait training, Patient/Family education, Self Care, Joint mobilization, Dry Needling, Electrical stimulation, Spinal mobilization, Cryotherapy, Moist heat, Traction, Ultrasound, Ionotophoresis 54m/ml Dexamethasone, Manual therapy, and Re-evaluation  PLAN FOR NEXT SESSION: grip strengthening; postural strengthening, neck ROM and stretching, manual therapy to decrease tightness cervical spine, modalities PRN, trial DN  BArtist Pais PTA 08/11/2022, 3:32 PM

## 2022-08-13 ENCOUNTER — Ambulatory Visit: Payer: Medicare Other

## 2022-08-18 ENCOUNTER — Ambulatory Visit: Payer: Medicare Other

## 2022-08-19 ENCOUNTER — Ambulatory Visit (INDEPENDENT_AMBULATORY_CARE_PROVIDER_SITE_OTHER): Payer: Medicare Other | Admitting: Family

## 2022-08-19 ENCOUNTER — Encounter: Payer: Self-pay | Admitting: Family

## 2022-08-19 VITALS — BP 160/90 | HR 88 | Temp 98.5°F | Ht 59.0 in | Wt 153.0 lb

## 2022-08-19 DIAGNOSIS — I10 Essential (primary) hypertension: Secondary | ICD-10-CM | POA: Diagnosis not present

## 2022-08-19 DIAGNOSIS — E215 Disorder of parathyroid gland, unspecified: Secondary | ICD-10-CM

## 2022-08-19 DIAGNOSIS — C50412 Malignant neoplasm of upper-outer quadrant of left female breast: Secondary | ICD-10-CM

## 2022-08-19 DIAGNOSIS — Z23 Encounter for immunization: Secondary | ICD-10-CM

## 2022-08-19 DIAGNOSIS — Z171 Estrogen receptor negative status [ER-]: Secondary | ICD-10-CM | POA: Diagnosis not present

## 2022-08-19 DIAGNOSIS — N182 Chronic kidney disease, stage 2 (mild): Secondary | ICD-10-CM | POA: Diagnosis not present

## 2022-08-19 DIAGNOSIS — I5022 Chronic systolic (congestive) heart failure: Secondary | ICD-10-CM | POA: Diagnosis not present

## 2022-08-19 NOTE — Progress Notes (Signed)
Carla Bennett is a 87 y.o. female with the following history as recorded in EpicCare:  Patient Active Problem List   Diagnosis Date Noted   Mixed hyperlipidemia 08/21/2021   Diverticulosis of colon without hemorrhage    Chronic systolic CHF (congestive heart failure) ([place]) 02/26/2016   Dilated cardiomyopathy ([place]) 02/26/2016   CKD (chronic kidney disease) 02/26/2016   Essential hypertension 07/26/2015   Family history of malignant neoplasm of gastrointestinal tract 03/08/2015   Breast cancer of upper-outer quadrant of left female breast ([place]) 02/23/2015   Hematochezia 01/06/2013    Current Outpatient Medications  Medication Sig Dispense Refill   B Complex Vitamins (VITAMIN B COMPLEX PO) Take 1 tablet by mouth daily.      calcium carbonate (TUMS) 500 MG chewable tablet Chew 2 tablets (400 mg of elemental calcium total) by mouth 3 times/day as needed-between meals & bedtime for indigestion or heartburn.     carvedilol (COREG) 25 MG tablet Take 0.5 tablets (12.5 mg total) by mouth 2 (two) times daily.     hyoscyamine (LEVSIN) 0.125 MG tablet TAKE 1-2 TABLET EVERY 4-6 HOURS AS NEEDED FOR ABDOMINAL PAIN 90 tablet 3   lidocaine (LIDODERM) 5 % Place 1 patch onto the skin daily. Remove & Discard patch within 12 hours or as directed by MD     lisinopril (ZESTRIL) 40 MG tablet Take 40 mg by mouth at bedtime.     loratadine (CLARITIN) 10 MG tablet Take 10 mg by mouth daily.     metroNIDAZOLE (METROCREAM) 0.75 % cream Apply 1 application topically daily.      spironolactone (ALDACTONE) 25 MG tablet Take 0.5 tablets (12.5 mg total) by mouth daily. 45 tablet 3   triamcinolone cream (KENALOG) 0.1 % SMARTSIG:1 Application Topical 2-3 Times Daily     No current facility-administered medications for this visit.    Allergies: Patient has no known allergies.  Past Medical History:  Diagnosis Date   Allergy    Aortic regurgitation    Arthritis    hands, neck   Basal cell carcinoma    Benign  neoplasm of colon    Breast cancer of upper-outer quadrant of left female breast ([place]) 02/23/2015   4'16-left breast cancer-surgery, chemo, radiation-[name] follows every 3 months   Cataract    beginning stages-monitoring by MD   Cholelithiasis    resolved with surgery   Chronic systolic CHF (congestive heart failure) ([place]) 02/26/2016   A.  Echo 02/21/16:  Mild LVH, EF 20-25%, diff HK, Gr 3 DD, mod AI, mod to severe MR, mod LAE, mod redcued RVSF, mild RAE, mod TR, PASP 53 mmHg, small pericardial effusion, mod L pleural effusion  //  B. Echo 6/17:  Mild LVH EF 30-35%, diffuse HK, grade 1 diastolic dysfunction, trivial AI, MAC, mild LAE, normal RVSF, trivial pericardial effusion     Cystocele    Diverticulitis    Diverticulosis of colon (without mention of hemorrhage)    Gout    resolved -   Hearing loss    some hearing loss in right ear- no hearing aids   Heart murmur    Hematuria    negative urology work up   History of blood transfusion 1971   x 1 with D&C -    Hx of abnormal cervical Pap smear    LEEP-CIN I, negative margins and ECC   Hypercalcemia    Hypertension    Mitral regurgitation    Neuromuscular disorder (HCC)    mild neuropathy in feet due  to chemo.   OA (osteoarthritis) of hip    right   Other issue of medical certificates    uterine myomata   Parathyroid abnormality ([place])    being followed Dr. Barbette Hair   Radiation 10/01/15-11/16/15   left breast axillary and supraclavicular reigion 45 gray, left breast 50.4 gray, lumpectomy cavity boost 10 gray   Renal disease 3/16   Stage 3 kidney disease-seeing Dr Buddy Duty and Dr Marval Regal   Uterine prolaps    resolved after surgery   Vitamin D deficiency     Past Surgical History:  Procedure Laterality Date   BREAST LUMPECTOMY Left 08/2015   CHOLECYSTECTOMY     COLONOSCOPY  01/18/2013   [name]   COLONOSCOPY N/A 06/26/2016   Procedure: COLONOSCOPY;  Surgeon: Irene Shipper, MD;  [facility];  Service: Endoscopy;   Laterality: N/A;   CYSTOCELE REPAIR  08/2010   Rectocele, vault prolapse   DILATION AND CURETTAGE OF UTERUS     x2   PORT-A-CATH REMOVAL Right 08/23/2015   Procedure: REMOVAL PORT-A-CATH;  Surgeon: Erroll Luna, MD;  Location: [place];  Service: General;  Laterality: Right;   PORTACATH PLACEMENT Right 03/20/2015   Procedure: INSERTION PORT-A-CATH;  Surgeon: Erroll Luna, MD;  Location: [place];  Service: General;  Laterality: Right;   RADIOACTIVE SEED GUIDED PARTIAL MASTECTOMY WITH AXILLARY SENTINEL LYMPH NODE BIOPSY Left 08/23/2015   Procedure: LEFT BREAST PARTIAL MASTECTOMY WITH SENTINEL LYMPH NODE MAPPING;  Surgeon: Erroll Luna, MD;  Location: [place];  Service: General;  Laterality: Left;   TONSILLECTOMY AND ADENOIDECTOMY     TOTAL VAGINAL HYSTERECTOMY     secondary to prolapse, ovaries not removed    Family History  Problem Relation Age of Onset   Parkinson's disease Mother    Dementia Mother    Lung cancer Father 17   Breast cancer Sister 27   Multiple sclerosis Sister    Prostate cancer Maternal Uncle 63   Stomach cancer Maternal Uncle    Cancer Maternal Uncle 65       GI cancer - ? stomach   Cancer Cousin 69       mat female first cousin (son of mat uncle with GI cancer) with ureter cancer   Colon cancer Neg Hx    Esophageal cancer Neg Hx    Rectal cancer Neg Hx    Colon polyps Neg Hx     Social History   Tobacco Use   Smoking status: Never   Smokeless tobacco: Never  Substance Use Topics   Alcohol use: Yes    Comment: occasionally  wine/beer    Subjective:   Presents today as a new patient; concerned about fatigue x 1 year;  Notes that has been cutting back on her blood pressure medication recently and has been feeling better; scheduled to see her cardiologist later this month- they have been managing her blood pressure medication;  Under care of nephrology every 6 months- Dr. Servando Salina; per patient, nephrology is managing  her hyperparathyroid;   GI every 6 months- 1 year due to history of IBS;  [facility]- yearly;   Dr. Lindi Adie- yearly for history of triple negative breast cancer;    Objective:  Vitals:   08/19/22 1312 08/19/22 1345  BP: (!) 162/90 (!) 160/90  Pulse: 88   Temp: 98.5 F (36.9 C)   TempSrc: Oral   SpO2: 96%   Weight: 153 lb (69.4 kg)   Height: _0  (1.499 m)  General: Well developed, well nourished, in no acute distress  Skin : Warm and dry.  Head: Normocephalic and atraumatic  Lungs: Respirations unlabored; clear to auscultation bilaterally without wheeze, rales, rhonchi  CVS exam: normal rate and regular rhythm.  Abdomen: Soft; nontender; nondistended; normoactive bowel sounds; no masses or hepatosplenomegaly  Musculoskeletal: No deformities; no active joint inflammation  Extremities: No edema, cyanosis, clubbing  Vessels: Symmetric bilaterally  Neurologic: Alert and oriented; speech intact; face symmetrical; moves all extremities well; CNII-XII intact without focal deficit   Assessment:  1. Chronic systolic CHF (congestive heart failure) ([place])   2. Essential hypertension   3. Stage 2 chronic kidney disease   4. Malignant neoplasm of upper-outer quadrant of left breast in female, estrogen receptor negative ([place])   5. Parathyroid disease ([place])     Plan:  & 2. Under care of cardiology; has been feeling much better with recent medication changes; keep planned follow up there for later next month; 3.   Continue with nephrology- she will have next copy of OV and labs done at that office forwarded to our office for review; 4.   Sees her oncologist yearly; 5.   Per patient, managed by nephrology;  Congratulated patient on commitment to her health; she understands and agrees that our office will be here as needed but will not plan for regular follow up at this time;  Flu shot updated; Follow up in 1 year, sooner prn.   Return in about 1 year (around  08/20/2023).  No orders of the defined types were placed in this encounter.   Requested Prescriptions    No prescriptions requested or ordered in this encounter

## 2022-08-20 ENCOUNTER — Ambulatory Visit: Payer: Medicare Other | Attending: Physician Assistant

## 2022-08-20 DIAGNOSIS — M25512 Pain in left shoulder: Secondary | ICD-10-CM | POA: Diagnosis not present

## 2022-08-20 DIAGNOSIS — M25511 Pain in right shoulder: Secondary | ICD-10-CM | POA: Diagnosis not present

## 2022-08-20 DIAGNOSIS — M6281 Muscle weakness (generalized): Secondary | ICD-10-CM | POA: Diagnosis not present

## 2022-08-20 DIAGNOSIS — R293 Abnormal posture: Secondary | ICD-10-CM

## 2022-08-20 NOTE — Therapy (Addendum)
OUTPATIENT PHYSICAL THERAPY TREATMENT      Patient Name: Carla Bennett MRN: [id number] DOB:[birth year], 87 y.o., female Today's Date: 08/20/2022   PT End of Session - 08/20/22 1455     Visit Number 17    Number of Visits 20    Date for PT Re-Evaluation 08/20/22    Authorization Type Medicare + [employer] supplement    Progress Note Due on Visit 91    PT Start Time 1401    PT Stop Time 1443    PT Time Calculation (min) 42 min    Activity Tolerance Patient tolerated treatment well    Behavior During Therapy WFL for tasks assessed/performed                        Past Medical History:  Diagnosis Date   Allergy    Aortic regurgitation    Arthritis    hands, neck   Basal cell carcinoma    Benign neoplasm of colon    Breast cancer of upper-outer quadrant of left female breast ([place]) 02/23/2015   4'16-left breast cancer-surgery, chemo, radiation-[name] follows every 3 months   Cataract    beginning stages-monitoring by MD   Cholelithiasis    resolved with surgery   Chronic systolic CHF (congestive heart failure) ([place]) 02/26/2016   A.  Echo 02/21/16:  Mild LVH, EF 20-25%, diff HK, Gr 3 DD, mod AI, mod to severe MR, mod LAE, mod redcued RVSF, mild RAE, mod TR, PASP 53 mmHg, small pericardial effusion, mod L pleural effusion  //  B. Echo 6/17:  Mild LVH EF 30-35%, diffuse HK, grade 1 diastolic dysfunction, trivial AI, MAC, mild LAE, normal RVSF, trivial pericardial effusion     Cystocele    Diverticulitis    Diverticulosis of colon (without mention of hemorrhage)    Gout    resolved -   Hearing loss    some hearing loss in right ear- no hearing aids   Heart murmur    Hematuria    negative urology work up   History of blood transfusion 1971   x 1 with D&C -    Hx of abnormal cervical Pap smear    LEEP-CIN I, negative margins and ECC   Hypercalcemia    Hypertension    Mitral regurgitation    Neuromuscular disorder (HCC)    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    OA (osteoarthritis) of hip    right   Other issue of medical certificates    uterine myomata   Parathyroid abnormality ([place])    being followed Dr. Barbette Hair   Radiation 10/01/15-11/16/15   left breast axillary and supraclavicular reigion 45 gray, left breast 50.4 gray, lumpectomy cavity boost 10 gray   Renal disease 3/16   Stage 3 kidney disease-seeing Dr Buddy Duty and Dr Marval Regal   Uterine prolaps    resolved after surgery   Vitamin D deficiency    Past Surgical History:  Procedure Laterality Date   BREAST LUMPECTOMY Left 08/2015   CHOLECYSTECTOMY     COLONOSCOPY  01/18/2013   [name]   COLONOSCOPY N/A 06/26/2016   Procedure: COLONOSCOPY;  Surgeon: Irene Shipper, MD;  Location: [facility];  Service: Endoscopy;  Laterality: N/A;   CYSTOCELE REPAIR  08/2010   Rectocele, vault prolapse   DILATION AND CURETTAGE OF UTERUS     x2   PORT-A-CATH REMOVAL Right 08/23/2015   Procedure: REMOVAL PORT-A-CATH;  Surgeon: Erroll Luna, MD;  Location: [facility];  Service: General;  Laterality: Right;   PORTACATH PLACEMENT Right 03/20/2015   Procedure: INSERTION PORT-A-CATH;  Surgeon: Erroll Luna, MD;  Location: [facility];  Service: General;  Laterality: Right;   RADIOACTIVE SEED GUIDED PARTIAL MASTECTOMY WITH AXILLARY SENTINEL LYMPH NODE BIOPSY Left 08/23/2015   Procedure: LEFT BREAST PARTIAL MASTECTOMY WITH SENTINEL LYMPH NODE MAPPING;  Surgeon: Erroll Luna, MD;  Location: [facility];  Service: General;  Laterality: Left;   TONSILLECTOMY AND ADENOIDECTOMY     TOTAL VAGINAL HYSTERECTOMY     secondary to prolapse, ovaries not removed   Patient Active Problem List   Diagnosis Date Noted   Mixed hyperlipidemia 08/21/2021   Diverticulosis of colon without hemorrhage    Chronic systolic CHF (congestive heart failure) ([place]) 02/26/2016   Dilated cardiomyopathy ([place]) 02/26/2016   CKD (chronic kidney disease) 02/26/2016   Essential hypertension 07/26/2015   Family history of  malignant neoplasm of gastrointestinal tract 03/08/2015   Breast cancer of upper-outer quadrant of left female breast ([place]) 02/23/2015   Hematochezia 01/06/2013    PCP: Donald Prose, MD  REFERRING PROVIDER: Olen Cordial, PA  REFERRING DIAG: M25.511 (ICD-10-CM) -Bilateral shoulder pain  THERAPY DIAG:  Acute pain of left shoulder  Acute pain of right shoulder  Abnormal posture  Muscle weakness (generalized)  Rationale for Evaluation and Treatment Rehabilitation  ONSET DATE: 5 weeks ago  SUBJECTIVE:                                                                                                                                                                                      SUBJECTIVE STATEMENT: Doing good just dealing with allergies.  PERTINENT HISTORY: PMH: basal cell carcinoma, breast cancer, CHF, diverticulitis, hearing loss, neuropathy of feet due to chemotherapy, R hip OA, HTN, bil hand OA  PAIN:  Are you having pain? Yes: NPRS scale: 1/10 Pain location: both shoulders Pain description: sore   PRECAUTIONS: Other: history of L sided breast cancer, keep resistance low.   Reporting history of orthostatic hypotension.   WEIGHT BEARING RESTRICTIONS No  FALLS:  Has patient fallen in last 6 months? No  LIVING ENVIRONMENT: Lives with: lives alone Lives in: House/apartment Stairs: No Has following equipment at home: Grab bars  OCCUPATION: Retired   PLOF: Independent  PATIENT GOALS "Be able to clean house like I used too pain free"  OBJECTIVE:   DIAGNOSTIC FINDINGS:  Cervical MRI 12/18/2020 IMPRESSION: 1. No acute fracture or acute intracranial findings. 2. Right facial soft tissue swelling overlying the maxilla. Probable soft tissue swelling along the chin eccentric to the right. 3. Periventricular white matter and corona radiata hypodensities favor chronic ischemic  microvascular white matter disease. 4. Cervical spondylosis and degenerative disc  disease causing multilevel osseous foraminal impingement. 5. Left common carotid atherosclerotic calcification. 6. Chronic paranasal sinusitis. Acute on chronic bilateral maxillary sinusitis.  PATIENT SURVEYS:  Quick Dash 56.8% disability   COGNITION:  Overall cognitive status: Within functional limits for tasks assessed     SENSATION: Slightly diminished L thumb  POSTURE: Forward head posture, rounded shoulder  CERVICAL ROM:  AROM eval 07/16/22  Cervical Flexion 40 43  Cervical Extension 45 49  Cervical Rotation to Left 70 65  Cervical Rotation to Right 70 65  Left Sidebend 40 39  Right Sidebend 30 31  No pain just tightness with movement.   UPPER EXTREMITY ROM:   full PROM bil,  AROM limited by pain.   Active ROM Right*  eval Left eval Right 07/14/22 Left 07/14/22  Shoulder flexion 120 125* 159 145  Shoulder extension 75 75    Shoulder abduction 120 125 155 157  Shoulder internal rotation  Functional T12 Functional T12 FIR to T9 FIR to T7  Shoulder external rotation Functional T2 Functional T2 FER to T2 FER to T2  Elbow flexion      Elbow extension      Wrist flexion      Wrist extension      (Blank rows = not tested)  UPPER EXTREMITY MMT:  MMT Right eval Left eval Left 07/14/22 Right 07/14/22  Shoulder flexion 4* 4* 4+ 4+  Shoulder extension      Shoulder abduction 4* 4* 4+ 4  Shoulder adduction      Shoulder internal rotation _0 Shoulder external rotation _1 Elbow flexion 5 5    Elbow extension 5 5    Wrist flexion      Wrist extension 5 5    Wrist ulnar deviation      Wrist radial deviation      Wrist pronation      Wrist supination      Grip strength  good good    (Blank rows = not tested)  *pain  SHOULDER SPECIAL TESTS:  Painful arc bil  Negative spurling  JOINT MOBILITY TESTING:  Full PROM, no capsular tightness  PALPATION:  No pain with palpation bil shoulders.  Noted increased cervical lordosis and hypomobility, tightness  anterior scalenes, levator, upper trap, and cervical paraspinals.     TODAY'S TREATMENT:  08/20/22 Therapeutic Exercise: to improve strength and mobility.  Demo, verbal and tactile cues throughout for technique. UBE L2 x 6 min (80f3b) Seated R wrist extension 2# 2x10 Seated R wrist flexion 2# 2x10 Seated R supination/pronation 2# 2x10 Seated wringing out towel both hands 20 reps both ways Velcrow board with drumstick 2x  Standing lat pulls 15# 2x10 Standing cw/ccw circles with ball on wall x 10 bil  08/11/2022 Therapeutic Exercise: to improve strength and mobility.  Demo, verbal and tactile cues throughout for technique. UBE L2 x 6 min ([id number]) UT stretch 2x30 sec bil Levator scapulae stretch 2x30" bil Seated R wrist extension 1# 2x15 Seated R wrist flexion 1# 2x15 Seated R supination/pronation 1# x 10 Manual Therapy: to decrease muscle spasm and pain and improve mobility STM and TPR to B UT, LS, rhomboids, cervical paraspinals  08/06/2022 Therapeutic Exercise: to improve strength and mobility.  Demo, verbal and tactile cues throughout for technique. UBE L2 x 6 min ([id number]) Unable to y-arms today due to pain in R shoulder Table slides x 15 flexion, x  10 abduction R R wrist extension 1# 2 x 10  R wrist flexion 1# 2 x 10 Shoulder rolls retro 2 x 10  Wall slides scaption x 10 bil - improved R shoulder ROM Manual Therapy: to decrease muscle spasm and pain and improve mobility STM/TPR to cervical paraspinals, R UT, PA mobs cervical spine, NAGS into rotation, Side glides, gentle cervical traction.   07/30/2022 Therapeutic Exercise: to improve strength and mobility.  UBE L2 x 6 min (55f3b) Thoracic extension glides - Y-arms facing wall x 10  Ulnar nerve walks 2 x 5 bil Open books x 10 bil  Review of HEP Manual Therapy: to decrease muscle spasm and pain and improve mobility STM/TPR to cervical paraspinals, R UT, PA mobs cervical spine, NAGS into rotation, Side glides, gentle cervical  traction.   Supine on MHP for patient comfort during manual therapy.     PATIENT EDUCATION: Education details: HEP update 07/24/22- discontinued wall angels, added ulnar walks and Y-arms  Person educated: Patient Education method: Explanation, Demonstration, Verbal cues, and Handouts Education comprehension: verbalized understanding and returned demonstration   HOME EXERCISE PROGRAM: Access Code: [email or web address]  ASSESSMENT:  CLINICAL IMPRESSION: Pt has met functional goals of being able to read without N/T in hands and being able to get out of bed in am w/o shoulder pain. Shoulder AROM remains the same today as well as shoulder strength, however she has functionally made improvements. Continued work on grip strengthening to improve function with opening jars, she was still showing difficulty with this. Pt feels at this point she can do 30 day hold from PT as she has made good progress with only mild limitations with ADLs, she will be placed on 30 day hold.   OBJECTIVE IMPAIRMENTS decreased activity tolerance, decreased ROM, decreased strength, increased fascial restrictions, impaired perceived functional ability, increased muscle spasms, impaired sensation, impaired UE functional use, postural dysfunction, and pain.   ACTIVITY LIMITATIONS carrying, lifting, bending, sleeping, bed mobility, dressing, reach over head, and hygiene/grooming  PARTICIPATION LIMITATIONS: cleaning and laundry  PERSONAL FACTORS Age and 3+ comorbidities: h/o breast cancer, CHF, diverticulitis, hearing loss, neuropathy of feet due to chemotherapy, R hip OA, HTN, bil hand OA  are also affecting patient's functional outcome.   REHAB POTENTIAL: Good  CLINICAL DECISION MAKING: Evolving/moderate complexity  EVALUATION COMPLEXITY: Moderate   GOALS: Goals reviewed with patient? Yes  SHORT TERM GOALS: Target date: 06/25/2022   Patient will be independent with initial HEP.  Baseline: given Goal status: MET 06/19/22 -  reports good compliance and performing 2-3x daily.    LONG TERM GOALS: Target date: 07/23/2022 extended to 08/20/2022.   Patient will be independent with advanced/ongoing HEP to improve outcomes and carryover.  Baseline: needs progression Goal status: IN PROGRESS  2.  Patient will report 75% improvement in bil shoulder pain to improve QOL.  Baseline: 6-9/10 Goal status: MET - 07/09/22 (80-90% improvement)  3.  Patient to demonstrate improved upright posture with posterior shoulder girdle engaged to promote improved glenohumeral joint mobility. Baseline: forward head posture Goal status: MET - 07/14/22   4.  Patient to improve bil shoulder [facility] to allow for increased ease of ADLs.  Baseline: see objective,  AROM limited by pain Goal status: IN PROGRESS  5.  Patient will demonstrate improved functional UE strength as demonstrated by 5/5 bil shoulder strength. Baseline: 4/5 bil limited by pain Goal status: IN PROGRESS 07/14/2022 4+/5 but not painful.   6  Patient will report <40%  impairment on quickDash  to demonstrate improved functional ability.  Baseline: 56.8% impairment.  Goal status: MET - 07/14/22 27.3%  7.  Patient will be able to read without numbness/tingling in hands.    Baseline: increases, difficulty holding hardback books Goal status: MET- 08/20/22   8. Patient will report decreased bil shoulder pain in mornings to be able to use arms to get out of bed.  Baseline: has to swing legs out, cant use arms Goal status: MET - 08/20/22    PLAN: PT FREQUENCY: 1-2x/week  PT DURATION: 6 weeks extending 4 weeks to 08/20/2022  PLANNED INTERVENTIONS: Therapeutic exercises, Therapeutic activity, Neuromuscular re-education, Balance training, Gait training, Patient/Family education, Self Care, Joint mobilization, Dry Needling, Electrical stimulation, Spinal mobilization, Cryotherapy, Moist heat, Traction, Ultrasound, Ionotophoresis 61m/ml Dexamethasone,  Manual therapy, and Re-evaluation  PLAN FOR NEXT SESSION: grip strengthening; postural strengthening, neck ROM and stretching, manual therapy to decrease tightness cervical spine, modalities PRN, trial DN  Coralyn Roselli L CCarlis Abbott PTA 08/20/2022, 2:56 PM  PHYSICAL THERAPY DISCHARGE SUMMARY  Visits from Start of Care: 17  Current functional level related to goals / functional outcomes: Decreased cervical/shoulder pain, improved sleep, improved shoulder ROM bil without pain.   Remaining deficits: Decreased grip strength, difficulty opening jars   Education / Equipment: HEP  Plan: Patient agrees to discharge. Patient is being discharged due to meeting the stated rehab goals.  She requested 30 day hold on 08/20/22 due to progress and has not needed to return during stated period.     ERennie Natter PT, DPT 9:26 AM 10/01/2022

## 2022-08-22 ENCOUNTER — Other Ambulatory Visit: Payer: Self-pay

## 2022-08-25 ENCOUNTER — Ambulatory Visit: Payer: Medicare Other | Admitting: Cardiology

## 2022-08-28 ENCOUNTER — Observation Stay (HOSPITAL_COMMUNITY)
Admission: EM | Admit: 2022-08-28 | Discharge: 2022-08-29 | Disposition: A | Discharge status: Home / Self Care | Payer: Medicare Other | Attending: Family Medicine | Admitting: Family Medicine

## 2022-08-28 ENCOUNTER — Encounter (HOSPITAL_COMMUNITY): Payer: Self-pay

## 2022-08-28 ENCOUNTER — Other Ambulatory Visit: Payer: Self-pay

## 2022-08-28 DIAGNOSIS — R55 Syncope and collapse: Secondary | ICD-10-CM | POA: Diagnosis not present

## 2022-08-28 DIAGNOSIS — I10 Essential (primary) hypertension: Secondary | ICD-10-CM | POA: Diagnosis not present

## 2022-08-28 DIAGNOSIS — G459 Transient cerebral ischemic attack, unspecified: Secondary | ICD-10-CM | POA: Insufficient documentation

## 2022-08-28 DIAGNOSIS — I130 Hypertensive heart and chronic kidney disease with heart failure and stage 1 through stage 4 chronic kidney disease, or unspecified chronic kidney disease: Secondary | ICD-10-CM | POA: Insufficient documentation

## 2022-08-28 DIAGNOSIS — D75839 Thrombocytosis, unspecified: Secondary | ICD-10-CM | POA: Insufficient documentation

## 2022-08-28 DIAGNOSIS — Z79899 Other long term (current) drug therapy: Secondary | ICD-10-CM | POA: Diagnosis not present

## 2022-08-28 DIAGNOSIS — Z85828 Personal history of other malignant neoplasm of skin: Secondary | ICD-10-CM | POA: Diagnosis not present

## 2022-08-28 DIAGNOSIS — N1831 Chronic kidney disease, stage 3a: Secondary | ICD-10-CM | POA: Insufficient documentation

## 2022-08-28 DIAGNOSIS — R9431 Abnormal electrocardiogram [ECG] [EKG]: Secondary | ICD-10-CM | POA: Diagnosis not present

## 2022-08-28 DIAGNOSIS — I6789 Other cerebrovascular disease: Secondary | ICD-10-CM | POA: Diagnosis not present

## 2022-08-28 DIAGNOSIS — R42 Dizziness and giddiness: Secondary | ICD-10-CM | POA: Diagnosis not present

## 2022-08-28 DIAGNOSIS — I959 Hypotension, unspecified: Secondary | ICD-10-CM | POA: Diagnosis not present

## 2022-08-28 DIAGNOSIS — Z853 Personal history of malignant neoplasm of breast: Secondary | ICD-10-CM | POA: Diagnosis not present

## 2022-08-28 DIAGNOSIS — R410 Disorientation, unspecified: Secondary | ICD-10-CM | POA: Diagnosis not present

## 2022-08-28 DIAGNOSIS — I5022 Chronic systolic (congestive) heart failure: Secondary | ICD-10-CM | POA: Diagnosis present

## 2022-08-28 DIAGNOSIS — N183 Chronic kidney disease, stage 3 unspecified: Secondary | ICD-10-CM | POA: Diagnosis present

## 2022-08-28 MED ORDER — ASPIRIN 81 MG PO CHEW
324.0000 mg | CHEWABLE_TABLET | Freq: Once | ORAL | Status: AC
  Administered 2022-08-29: 324 mg via ORAL
  Filled 2022-08-28: qty 4

## 2022-08-28 NOTE — ED Notes (Signed)
Ambulatory to restroom to provide urine sample.

## 2022-08-28 NOTE — ED Provider Notes (Signed)
[facility] Provider Note   CSN: [id number] Arrival date & time: 08/28/22  2255     History  Chief Complaint  Patient presents with   Dizziness    Carla Bennett is a 87 y.o. female.  The history is provided by the patient.  Dizziness She has history of hypertension, systolic heart failure, breast cancer and comes in because of 3 episodes of dizziness.  She was playing cards when she felt lightheaded and noted nausea.  She laid down and felt better.  Within a couple minutes, the dizziness passed and she resumed playing cards.  She had a second episode which was virtually identical.  She resumed playing cards and had a third episode which was similar but lasted longer.  Third episode lasted about 45 minutes.  She denies any sense of the room spinning or moving.  She denies any headache or tinnitus or hearing loss.  She did note that her heart felt like it was pounding and perhaps going a little fast with each of these episodes.  She denies chest pain, heaviness, tightness, pressure.  She denies any weakness, numbness, tingling.  She came in by ambulance and was still having dizziness while she was in the ambulance.  She is currently back to her baseline.   Home Medications Prior to Admission medications   Medication Sig Start Date End Date Taking? Authorizing Provider  B Complex Vitamins (VITAMIN B COMPLEX PO) Take 1 tablet by mouth daily.     [provider]  calcium carbonate (TUMS) 500 MG chewable tablet Chew 2 tablets (400 mg of elemental calcium total) by mouth 3 times/day as needed-between meals & bedtime for indigestion or heartburn. 11/17/18   Nicholas Lose, MD  carvedilol (COREG) 25 MG tablet Take 0.5 tablets (12.5 mg total) by mouth 2 (two) times daily. 07/18/22   Elgie Collard, PA-C  hyoscyamine (LEVSIN) 0.125 MG tablet TAKE 1-2 TABLET EVERY 4-6 HOURS AS NEEDED FOR ABDOMINAL PAIN 02/27/22   Irene Shipper, MD  lidocaine (LIDODERM)  5 % Place 1 patch onto the skin daily. Remove & Discard patch within 12 hours or as directed by MD    [provider]  lisinopril (ZESTRIL) 40 MG tablet Take 40 mg by mouth at bedtime.    [provider]  loratadine (CLARITIN) 10 MG tablet Take 10 mg by mouth daily.    [provider]  metroNIDAZOLE (METROCREAM) 0.75 % cream Apply 1 application topically daily.  11/27/19   [provider]  spironolactone (ALDACTONE) 25 MG tablet Take 0.5 tablets (12.5 mg total) by mouth daily. 10/30/21   Jerline Pain, MD  triamcinolone cream (KENALOG) 0.1 % SMARTSIG:1 Application Topical 2-3 Times Daily 03/27/20   [provider]      Allergies    Patient has no known allergies.    Review of Systems   Review of Systems  Neurological:  Positive for dizziness.  All other systems reviewed and are negative.   Physical Exam Updated Vital Signs BP (!) 210/106   Pulse 93   Temp 98.5 F (36.9 C) (Oral)   Resp 17   Ht '4\' 11"'$  (1.499 m)   Wt 68 kg   LMP 10/20/1996   SpO2 96%   BMI 30.30 kg/m  Physical Exam Vitals and nursing note reviewed.   87 year old female, resting comfortably and in no acute distress. Vital signs are significant for elevated blood pressure. Oxygen saturation is 96%, which is normal.  Head is normocephalic and atraumatic. PERRLA, EOMI. Oropharynx is clear. Neck is nontender and supple without adenopathy or JVD.  There are no carotid bruits. Back is nontender and there is no CVA tenderness. Lungs are clear without rales, wheezes, or rhonchi. Chest is nontender. Heart has regular rate and rhythm without murmur. Abdomen is soft, flat, nontender. Extremities have no cyanosis or edema, full range of motion is present. Skin is warm and dry without rash. Neurologic: She is right-handed.  Mental status is normal, cranial nerves are intact.  There is very slight weakness of the right leg right arm compared with the left with strength 4+/5.  There is  no nystagmus.  There is no pronator drift.  Finger-nose testing is normal.  Dizziness is not reproduced by passive head movement.   ED Results / Procedures / Treatments   Labs (all labs ordered are listed, but only abnormal results are displayed) Labs Reviewed  CBC - Abnormal; Notable for the following components:      Result Value   Platelets 464 (*)    All other components within normal limits  COMPREHENSIVE METABOLIC PANEL - Abnormal; Notable for the following components:   Glucose, Bld 112 (*)    Creatinine, Ser 1.08 (*)    Calcium 10.5 (*)    GFR, Estimated 50 (*)    All other components within normal limits  URINALYSIS, ROUTINE W REFLEX MICROSCOPIC - Abnormal; Notable for the following components:   Color, Urine COLORLESS (*)    Specific Gravity, Urine 1.004 (*)    All other components within normal limits  ETHANOL  PROTIME-INR  APTT  DIFFERENTIAL  RAPID URINE DRUG SCREEN, HOSP PERFORMED    EKG EKG Interpretation  Date/Time:  Thursday August 28 2022 23:10:35 EST Ventricular Rate:  94 PR Interval:  200 QRS Duration: 84 QT Interval:  343 QTC Calculation: 429 R Axis:   -49 Text Interpretation: Sinus rhythm Left anterior fascicular block Anteroseptal infarct, age indeterminate When compared with ECG of 07/06/2021, No significant change was found Confirmed by Delora Fuel ([id number]) on 08/28/2022 11:25:31 PM  Radiology MR BRAIN WO CONTRAST  Result Date: 08/29/2022 CLINICAL DATA:  Episodes of dizziness and nausea EXAM: MRI HEAD WITHOUT CONTRAST MRA HEAD WITHOUT CONTRAST TECHNIQUE: Multiplanar, multi-echo pulse sequences of the brain and surrounding structures were acquired without intravenous contrast. Angiographic images of the Circle of Willis were acquired using MRA technique without intravenous contrast. COMPARISON:  No prior MRI available FINDINGS: MRI HEAD FINDINGS Brain: No restricted diffusion to suggest acute or subacute infarct. No acute hemorrhage, mass, mass  effect, or midline shift. No hemosiderin deposition to suggest remote hemorrhage. No hydrocephalus or extra-axial collection. Cerebral volume is within normal limits for age. T2 hyperintense signal in the periventricular white matter, likely the sequela of mild-to-moderate chronic small vessel ischemic disease. Vascular: Normal arterial flow voids. Skull and upper cervical spine: Normal marrow signal. Sinuses/Orbits: Clear paranasal sinuses. The orbits are unremarkable. Other: The mastoids are well aerated. MRA HEAD FINDINGS Anterior circulation: Both internal carotid arteries are patent to the termini, without significant stenosis. A1 segments patent. Normal anterior communicating artery. Anterior cerebral arteries are patent to their distal aspects. No M1 stenosis or occlusion. Normal MCA bifurcations. Distal MCA branches perfused and symmetric. Posterior circulation: The right vertebral artery primarily supplies the right PICA, with a very diminutive continuing vessel. The left vertebral artery is patent to the vertebrobasilar junction without stenosis. Posterior inferior cerebral arteries patent bilaterally. Basilar patent to its distal aspect. Superior cerebellar arteries  patent bilaterally. Patent P1 segments. PCAs perfused to their distal aspects without stenosis. The bilateral posterior communicating arteries are not definitively visualized. Anatomic variants: None significant IMPRESSION: 1. No acute intracranial process. No evidence of acute or subacute infarct. 2. No intracranial large vessel occlusion or significant stenosis. Electronically Signed   By: Merilyn Baba M.D.   On: 08/29/2022 03:15   MR ANGIO HEAD WO CONTRAST  Result Date: 08/29/2022 CLINICAL DATA:  Episodes of dizziness and nausea EXAM: MRI HEAD WITHOUT CONTRAST MRA HEAD WITHOUT CONTRAST TECHNIQUE: Multiplanar, multi-echo pulse sequences of the brain and surrounding structures were acquired without intravenous contrast. Angiographic  images of the Circle of Willis were acquired using MRA technique without intravenous contrast. COMPARISON:  No prior MRI available FINDINGS: MRI HEAD FINDINGS Brain: No restricted diffusion to suggest acute or subacute infarct. No acute hemorrhage, mass, mass effect, or midline shift. No hemosiderin deposition to suggest remote hemorrhage. No hydrocephalus or extra-axial collection. Cerebral volume is within normal limits for age. T2 hyperintense signal in the periventricular white matter, likely the sequela of mild-to-moderate chronic small vessel ischemic disease. Vascular: Normal arterial flow voids. Skull and upper cervical spine: Normal marrow signal. Sinuses/Orbits: Clear paranasal sinuses. The orbits are unremarkable. Other: The mastoids are well aerated. MRA HEAD FINDINGS Anterior circulation: Both internal carotid arteries are patent to the termini, without significant stenosis. A1 segments patent. Normal anterior communicating artery. Anterior cerebral arteries are patent to their distal aspects. No M1 stenosis or occlusion. Normal MCA bifurcations. Distal MCA branches perfused and symmetric. Posterior circulation: The right vertebral artery primarily supplies the right PICA, with a very diminutive continuing vessel. The left vertebral artery is patent to the vertebrobasilar junction without stenosis. Posterior inferior cerebral arteries patent bilaterally. Basilar patent to its distal aspect. Superior cerebellar arteries patent bilaterally. Patent P1 segments. PCAs perfused to their distal aspects without stenosis. The bilateral posterior communicating arteries are not definitively visualized. Anatomic variants: None significant IMPRESSION: 1. No acute intracranial process. No evidence of acute or subacute infarct. 2. No intracranial large vessel occlusion or significant stenosis. Electronically Signed   By: Merilyn Baba M.D.   On: 08/29/2022 03:15    Procedures Procedures  Cardiac monitor shows  normal sinus rhythm, per my interpretation.  Medications Ordered in ED Medications  aspirin chewable tablet 324 mg (has no administration in time range)    ED Course/ Medical Decision Making/ A&P                           Medical Decision Making Amount and/or Complexity of Data Reviewed Labs: ordered. Radiology: ordered.  Risk OTC drugs.   3 episodes of dizziness with some aspects consistent with peripheral vertigo, some consistent with central vertigo.  I am concerned that this actually represents transient ischemic attacks.  Elevated blood pressure with known history of hypertension, will need to be observed closely.  Consider primary arrhythmia, but heart rate was normal while in the ambulance and patient was symptomatic.  I have reviewed and interpreted her electrocardiogram, and my interpretation is left anterior fascicular block but otherwise normal ECG and unchanged from prior.  Since my concern is a posterior circulation TIA, I am not ordering a CT scan and proceeding directly to an MRI scan.  Since she is currently neurologically normal, she is not a candidate for code stroke.  I have ordered oral aspirin, ordered routine stroke labs and ordered MRI of the brain and MRA of the head.  I have reviewed her old records, and see no relevant past visits.  I have reviewed and interpreted her laboratory test, my interpretation is mild thrombocytosis of uncertain cause, minimal elevation of serum creatinine and serum calcium not felt to be clinically significant.  MRI of brain and MRA of the head show no evidence of stroke or acute vascular occlusion.  I have independently viewed the images, and agree with radiologist's interpretation.  I feel she needs to be admitted for completion of the TIA evaluation.  I discussed the case with Dr. Marlowe Sax of [facility] hospital[facility], who agrees to admit the patient.  Final Clinical Impression(s) / ED Diagnoses Final diagnoses:  TIA (transient ischemic attack)   Thrombocytosis    Rx / DC Orders ED Discharge Orders     None         Delora Fuel, MD 97/94/80 [phone]

## 2022-08-28 NOTE — ED Triage Notes (Signed)
Arrives EMS from a friends house after playing cards.   Describes 2 episodes of dizziness of 10-15 minutes followed by nausea. 3rd occurrence and friend called 911 after no improvement.

## 2022-08-29 ENCOUNTER — Emergency Department (HOSPITAL_COMMUNITY): Payer: Medicare Other

## 2022-08-29 ENCOUNTER — Observation Stay (HOSPITAL_BASED_OUTPATIENT_CLINIC_OR_DEPARTMENT_OTHER): Payer: Medicare Other

## 2022-08-29 DIAGNOSIS — R42 Dizziness and giddiness: Secondary | ICD-10-CM

## 2022-08-29 DIAGNOSIS — G459 Transient cerebral ischemic attack, unspecified: Secondary | ICD-10-CM | POA: Diagnosis not present

## 2022-08-29 LAB — DIFFERENTIAL
Abs Immature Granulocytes: 0.03 10*3/uL (ref 0.00–0.07)
Basophils Absolute: 0.1 10*3/uL (ref 0.0–0.1)
Basophils Relative: 1 %
Eosinophils Absolute: 0.2 10*3/uL (ref 0.0–0.5)
Eosinophils Relative: 2 %
Immature Granulocytes: 0 %
Lymphocytes Relative: 16 %
Lymphs Abs: 1.4 10*3/uL (ref 0.7–4.0)
Monocytes Absolute: 0.5 10*3/uL (ref 0.1–1.0)
Monocytes Relative: 6 %
Neutro Abs: 6.7 10*3/uL (ref 1.7–7.7)
Neutrophils Relative %: 75 %

## 2022-08-29 LAB — URINALYSIS, ROUTINE W REFLEX MICROSCOPIC
Bilirubin Urine: NEGATIVE
Glucose, UA: NEGATIVE mg/dL
Hgb urine dipstick: NEGATIVE
Ketones, ur: NEGATIVE mg/dL
Leukocytes,Ua: NEGATIVE
Nitrite: NEGATIVE
Protein, ur: NEGATIVE mg/dL
Specific Gravity, Urine: 1.004 — ABNORMAL LOW (ref 1.005–1.030)
pH: 8 (ref 5.0–8.0)

## 2022-08-29 LAB — RAPID URINE DRUG SCREEN, HOSP PERFORMED
Amphetamines: NOT DETECTED
Barbiturates: NOT DETECTED
Benzodiazepines: NOT DETECTED
Cocaine: NOT DETECTED
Opiates: NOT DETECTED
Tetrahydrocannabinol: NOT DETECTED

## 2022-08-29 LAB — CBC
HCT: 44.8 % (ref 36.0–46.0)
Hemoglobin: 14.1 g/dL (ref 12.0–15.0)
MCH: 28.5 pg (ref 26.0–34.0)
MCHC: 31.5 g/dL (ref 30.0–36.0)
MCV: 90.7 fL (ref 80.0–100.0)
Platelets: 464 10*3/uL — ABNORMAL HIGH (ref 150–400)
RBC: 4.94 MIL/uL (ref 3.87–5.11)
RDW: 12.4 % (ref 11.5–15.5)
WBC: 9 10*3/uL (ref 4.0–10.5)
nRBC: 0 % (ref 0.0–0.2)

## 2022-08-29 LAB — ETHANOL: Alcohol, Ethyl (B): <10 mg/dL (ref <10)

## 2022-08-29 LAB — LIPID PANEL
Cholesterol: 113 mg/dL (ref 0–200)
HDL: 44 mg/dL (ref >40)
LDL Cholesterol: 54 mg/dL (ref 0–99)
Total CHOL/HDL Ratio: 2.6 RATIO
Triglycerides: 74 mg/dL (ref <150)
VLDL: 15 mg/dL (ref 0–40)

## 2022-08-29 LAB — COMPREHENSIVE METABOLIC PANEL
ALT: 14 U/L (ref 0–44)
AST: 19 U/L (ref 15–41)
Albumin: 3.7 g/dL (ref 3.5–5.0)
Alkaline Phosphatase: 62 U/L (ref 38–126)
Anion gap: 11 (ref 5–15)
BUN: 14 mg/dL (ref 8–23)
CO2: 22 mmol/L (ref 22–32)
Calcium: 10.5 mg/dL — ABNORMAL HIGH (ref 8.9–10.3)
Chloride: 106 mmol/L (ref 98–111)
Creatinine, Ser: 1.08 mg/dL — ABNORMAL HIGH (ref 0.44–1.00)
GFR, Estimated: 50 mL/min — ABNORMAL LOW (ref >60)
Glucose, Bld: 112 mg/dL — ABNORMAL HIGH (ref 70–99)
Potassium: 4.1 mmol/L (ref 3.5–5.1)
Sodium: 139 mmol/L (ref 135–145)
Total Bilirubin: 0.5 mg/dL (ref 0.3–1.2)
Total Protein: 6.9 g/dL (ref 6.5–8.1)

## 2022-08-29 LAB — PROTIME-INR
INR: 1 (ref 0.8–1.2)
Prothrombin Time: 13.4 seconds (ref 11.4–15.2)

## 2022-08-29 LAB — TSH: TSH: 2.584 u[IU]/mL (ref 0.350–4.500)

## 2022-08-29 LAB — VITAMIN D 25 HYDROXY (VIT D DEFICIENCY, FRACTURES): Vit D, 25-Hydroxy: 10.13 ng/mL — ABNORMAL LOW (ref 30–100)

## 2022-08-29 LAB — HEMOGLOBIN A1C
Hgb A1c MFr Bld: 5.3 % (ref 4.8–5.6)
Mean Plasma Glucose: 105.41 mg/dL

## 2022-08-29 LAB — APTT: aPTT: 33 seconds (ref 24–36)

## 2022-08-29 MED ORDER — ACETAMINOPHEN 650 MG RE SUPP: 650.0000 mg | Freq: Four times a day (QID) | RECTAL | Status: DC | PRN

## 2022-08-29 MED ORDER — ASPIRIN 81 MG PO TBEC: 81.0000 mg | DELAYED_RELEASE_TABLET | Freq: Every day | ORAL | Status: AC

## 2022-08-29 MED ORDER — SPIRONOLACTONE 12.5 MG HALF TABLET
12.5000 mg | ORAL_TABLET | Freq: Every day | ORAL | Status: DC
  Filled 2022-08-29: qty 1

## 2022-08-29 MED ORDER — STROKE: EARLY STAGES OF RECOVERY BOOK: Freq: Once | Status: DC

## 2022-08-29 MED ORDER — CARVEDILOL 12.5 MG PO TABS: 12.5000 mg | ORAL_TABLET | Freq: Two times a day (BID) | ORAL | Status: DC

## 2022-08-29 MED ORDER — ACETAMINOPHEN 325 MG PO TABS: 650.0000 mg | ORAL_TABLET | Freq: Four times a day (QID) | ORAL | Status: DC | PRN

## 2022-08-29 MED ORDER — ENOXAPARIN SODIUM 40 MG/0.4ML IJ SOSY: 40.0000 mg | PREFILLED_SYRINGE | INTRAMUSCULAR | Status: DC

## 2022-08-29 MED ORDER — LISINOPRIL 20 MG PO TABS: 40.0000 mg | ORAL_TABLET | Freq: Every day | ORAL | Status: DC

## 2022-08-29 NOTE — Hospital Course (Addendum)
87 year old woman PMH including CKD, hypertension, presented with dizziness, 3 discrete episodes.  Placed in observation for TIA evaluation.  Seen by neurology, given her history, as well as negative MRI and MRA of the brain, no evidence of vertebrobasilar insufficiency, events favored to be related to relative hypotension or potentially cardiac arrhythmia.  Dizziness Patient presenting after 3 episodes of dizziness at home.  Currently asymptomatic. Brain MRI negative for acute or subacute infarct. MRA head negative for intracranial LVO or significant stenosis.  Symptoms could be due to TIA versus possible peripheral vertigo.  ?Arrhythmia but patient denies any palpitations and reportedly heart rate was normal with EMS when she was symptomatic.  Blood ethanol level undetectable.  UDS negative. -Neurology consulted -Telemetry monitoring -Hemoglobin A1c, fasting lipid panel -No repeat echocardiogram ordered as it was done less than a month ago.  Will defer to neurology. -Patient received aspirin 324 mg in the ED.  Further recommendations per neurology. -Frequent neurochecks  -PT consulted for vestibular evaluation -Fall precautions --ASA, not thought to be TIA - Telemetry monitoring; 30 day event monitor on discharge if no arrythmias captured given left atrial enlargement on recent echocardiogram, and episodic events more concerning for cardiac than primary neurologic etiology    Borderline PR prolongation on EKG -Cardiac monitoring -Check TSH   Borderline hypercalcemia -Check ionized calcium level, PTH, vitamin D level   History of HFrEF EF was 20 to 25% in 2017, improved to 60 to 65% on recent echo done 08/04/2022 with normal diastolic parameters.  No signs of volume overload on exam. -Continue to monitor volume status   Hypertension Recorded blood pressure 210/106 on arrival to the ED, now improved without administration of any antihypertensives. -Continue Coreg, lisinopril,  spironolactone

## 2022-08-29 NOTE — Consult Note (Signed)
Neurology Consultation Reason for Consult: c/f posterior circulation TIA  Requesting Physician: Delora Fuel  CC: Episodic dizziness  History is obtained from: Patient and chart review   HPI: Carla Bennett is a 87 y.o. female with a past medical history significant for hypertension, stage III CKD, aortic regurgitation, remote breast cancer (2016 s/p surgery chemo and radiation), right ear hearing loss, right hip osteoarthritis  Patient reports that she was in her usual state of health today, playing bridge with her friends when she began to feel generally weak, nauseated, but without headache, vision changes, shortness of breath, chest pain, palpitations or other cardiac symptoms.  Her friends told her she looked quite pale, she laid down on a couch and started to feel better after about 2 minutes.  However after she had gotten up about 30 minutes later she again had another completely identical episode that lasted about 2 minutes.  She again recovered after laying down, but had a third episode which lasted longer about 10 to 15 minutes but otherwise was again identical to the first 2 episodes.  She initially declined coming into the hospital because she notes she has been having similar episodes for last year but typically very rarely (once every month or 2)  She notes she has been tracking her blood pressure pre and post blood pressure medications and she feels generally weak and unwell anytime her systolic blood pressure is less than 140.   ROS: All other review of systems was negative except as noted in the HPI.   Past Medical History:  Diagnosis Date   Allergy    Aortic regurgitation    Arthritis    hands, neck   Basal cell carcinoma    Benign neoplasm of colon    Breast cancer of upper-outer quadrant of left female breast ([place]) 02/23/2015   4'16-left breast cancer-surgery, chemo, radiation-[name] follows every 3 months   Cataract    beginning stages-monitoring by MD    Cholelithiasis    resolved with surgery   Chronic systolic CHF (congestive heart failure) ([place]) 02/26/2016   A.  Echo 02/21/16:  Mild LVH, EF 20-25%, diff HK, Gr 3 DD, mod AI, mod to severe MR, mod LAE, mod redcued RVSF, mild RAE, mod TR, PASP 53 mmHg, small pericardial effusion, mod L pleural effusion  //  B. Echo 6/17:  Mild LVH EF 30-35%, diffuse HK, grade 1 diastolic dysfunction, trivial AI, MAC, mild LAE, normal RVSF, trivial pericardial effusion     Cystocele    Diverticulitis    Diverticulosis of colon (without mention of hemorrhage)    Gout    resolved -   Hearing loss    some hearing loss in right ear- no hearing aids   Heart murmur    Hematuria    negative urology work up   History of blood transfusion 1971   x 1 with D&C -    Hx of abnormal cervical Pap smear    LEEP-CIN I, negative margins and ECC   Hypercalcemia    Hypertension    Mitral regurgitation    Neuromuscular disorder (HCC)    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   OA (osteoarthritis) of hip    right   Other issue of medical certificates    uterine myomata   Parathyroid abnormality ([place])    being followed Dr. Barbette Hair   Radiation 10/01/15-11/16/15   left breast axillary and supraclavicular reigion 45 gray, left breast 50.4 gray, lumpectomy cavity boost 10 gray  Renal disease 3/16   Stage 3 kidney disease-seeing Dr Buddy Duty and Dr Marval Regal   Uterine prolaps    resolved after surgery   Vitamin D deficiency    Past Surgical History:  Procedure Laterality Date   BREAST LUMPECTOMY Left 08/2015   CHOLECYSTECTOMY     COLONOSCOPY  01/18/2013   [name]   COLONOSCOPY N/A 06/26/2016   Procedure: COLONOSCOPY;  Surgeon: Irene Shipper, MD;  Location: [facility];  Service: Endoscopy;  Laterality: N/A;   CYSTOCELE REPAIR  08/2010   Rectocele, vault prolapse   DILATION AND CURETTAGE OF UTERUS     x2   PORT-A-CATH REMOVAL Right 08/23/2015   Procedure: REMOVAL PORT-A-CATH;  Surgeon: Erroll Luna, MD;  Location: [facility];  Service: General;  Laterality: Right;   PORTACATH PLACEMENT Right 03/20/2015   Procedure: INSERTION PORT-A-CATH;  Surgeon: Erroll Luna, MD;  Location: [place];  Service: General;  Laterality: Right;   RADIOACTIVE SEED GUIDED PARTIAL MASTECTOMY WITH AXILLARY SENTINEL LYMPH NODE BIOPSY Left 08/23/2015   Procedure: LEFT BREAST PARTIAL MASTECTOMY WITH SENTINEL LYMPH NODE [place];  Surgeon: Erroll Luna, MD;  Location: [place];  Service: General;  Laterality: Left;   TONSILLECTOMY AND ADENOIDECTOMY     TOTAL VAGINAL HYSTERECTOMY     secondary to prolapse, ovaries not removed   Current Outpatient Medications  Medication Instructions   alendronate (FOSAMAX) 70 mg, Oral, Weekly, Monday    B Complex Vitamins (VITAMIN B COMPLEX PO) 1 tablet, Oral, Daily   calcium carbonate (TUMS) 500 MG chewable tablet 400 mg of elemental calcium, Oral, 2 times daily between meals and at bedtime PRN   carvedilol (COREG) 12.5 mg, Oral, 2 times daily   hyoscyamine (LEVSIN) 0.125 MG tablet TAKE 1-2 TABLET EVERY 4-6 HOURS AS NEEDED FOR ABDOMINAL PAIN   lisinopril (ZESTRIL) 40 mg, Oral, Daily at bedtime   loratadine (CLARITIN) 10 mg, Oral, Daily   spironolactone (ALDACTONE) 12.5 mg, Oral, Daily   Family History  Problem Relation Age of Onset   Parkinson's disease Mother    Dementia Mother    Lung cancer Father 71   Breast cancer Sister 69   Multiple sclerosis Sister    Prostate cancer Maternal Uncle 63   Stomach cancer Maternal Uncle    Cancer Maternal Uncle 65       GI cancer - ? stomach   Cancer Cousin 71       mat female first cousin (son of mat uncle with GI cancer) with ureter cancer   Colon cancer Neg Hx    Esophageal cancer Neg Hx    Rectal cancer Neg Hx    Colon polyps Neg Hx    Social History:  reports that she has never smoked. She has never used smokeless tobacco. She reports current alcohol use. She reports that she does not use drugs.   Exam: Current  vital signs: BP (!) 168/98   Pulse 82   Temp 98.5 F (36.9 C)   Resp 20   Ht _0  (1.499 m)   Wt 68 kg   LMP 10/20/1996   SpO2 94%   BMI 30.30 kg/m  Vital signs in last 24 hours: Temp:  [98.5 F (36.9 C)] 98.5 F (36.9 C) (11/10 0330) Pulse Rate:  [82-94] 82 (11/10 0330) Resp:  [17-25] 20 (11/10 0330) BP: (166-210)/(90-106) 168/98 (11/10 0330) SpO2:  [94 %-96 %] 94 % (11/10 0330) Weight:  [68 kg] 68 kg (11/09 2258)   Physical Exam  Constitutional: Appears  well-developed and well-nourished.  Psych: Affect appropriate to situation, calm and cooperative Eyes: No scleral injection HENT: No oropharyngeal obstruction.  MSK: no joint deformities.  Cardiovascular: Normal rate and regular rhythm. Perfusing extremities well Respiratory: Effort normal, non-labored breathing GI: Soft.  No distension. There is no tenderness.  Skin: Warm dry and intact visible skin  Neuro: Mental Status: Patient is awake, alert, oriented to person, place, month, year, and situation. Patient is able to give a clear and coherent history. No signs of aphasia or neglect Cranial Nerves: II: Visual Fields are full. Pupils are equal, round, and reactive to light.   III,IV, VI: EOMI without ptosis or diploplia.  V: Facial sensation is symmetric to temperature VII: Facial movement is symmetric.  VIII: hearing is intact to voice X: Uvula elevates symmetrically XI: Shoulder shrug is symmetric. XII: tongue is midline without atrophy or fasciculations.  Motor: Tone is normal. Bulk is normal. 5/5 strength was present in all four extremities.  Sensory: Sensation is symmetric to light touch and temperature in the arms and legs. Deep Tendon Reflexes: 2+ and symmetric in the brachioradialis and patellae.  Cerebellar: FNF and HKS are intact bilaterally Gait:  Deferred   NIHSS total 0  I have reviewed labs in epic and the results pertinent to this consultation are:  Basic Metabolic Panel: Recent Labs   Lab 08/29/22 0000  NA 139  K 4.1  CL 106  CO2 22  GLUCOSE 112*  BUN 14  CREATININE 1.08*  CALCIUM 10.5*    CBC: Recent Labs  Lab 08/29/22 0000  WBC 9.0  NEUTROABS 6.7  HGB 14.1  HCT 44.8  MCV 90.7  PLT 464*    Coagulation Studies: Recent Labs    08/29/22 0000  LABPROT 13.4  INR 1.0    Lab Results  Component Value Date   CHOL 140 11/29/2020   HDL 54 11/29/2020   LDLCALC 64 11/29/2020   TRIG 127 11/29/2020   CHOLHDL 2.6 11/29/2020   No results found for: "HGBA1C"   Echocardiogram 08/04/2022  1. Left ventricular ejection fraction, by estimation, is 60 to 65%. The  left ventricle has normal function. The left ventricle has no regional  wall motion abnormalities. There is mild left ventricular hypertrophy.  Left ventricular diastolic parameters  were normal. The average left ventricular global longitudinal strain is  -17.5 %. The global longitudinal strain is normal.   2. Right ventricular systolic function is normal. The right ventricular  size is normal. There is normal pulmonary artery systolic pressure.   3. Left atrial size was mildly dilated.   4. The mitral valve is abnormal. Mild mitral valve regurgitation. No  evidence of mitral stenosis.   5. The aortic valve is tricuspid. There is mild calcification of the  aortic valve. There is mild thickening of the aortic valve. Aortic valve  regurgitation is mild. Aortic valve sclerosis is present, with no evidence  of aortic valve stenosis.   6. The inferior vena cava is normal in size with greater than 50%  respiratory variability, suggesting right atrial pressure of 3 mmHg.   I have reviewed the images obtained:  MRI brain and MRA head personally reviewed, agree with radiology:  1. No acute intracranial process. No evidence of acute or subacute infarct. 2. No intracranial large vessel occlusion or significant stenosis.   Impression: Patient describes recurrent stereotyped events of being generally  weak ongoing for the past year, she was unaware until today that she becomes pale with these episodes as  well as the prior episodes have not been witnessed by anyone else; today is the first day that she had multiple events in a single day.  As symptoms are relieved by laying down and patient notes that she feels bad whenever her systolic blood pressure is less than 140, as well as the fact that her MRI has no acute intracranial process and MRA shows no evidence of vertebrobasilar insufficiency, I favor these events to be related to relative hypotension or potentially cardiac arrhythmia  Recommendations: - Stroke labs HgbA1c, fasting lipid panel for risk factor optimization given chronic microvascular changes on MRI - Carotid duplex, please reach out to vascular surgery if there is clinically significant stenosis - Frequent neuro checks - Given chronic microvascular ischemic changes on MRI brain, aspirin 81 mg for secondary prevention is reasonable - Will not start Plavix as I do not think this is a TIA - Risk factor modification - Telemetry monitoring; 30 day event monitor on discharge if no arrythmias captured given left atrial enlargement on recent echocardiogram, and episodic events more concerning for cardiac than primary neurologic etiology - Blood pressure goal   - Normotension given MRI negative for stroke - PT consult, OT consult, Speech consult, not indicated given patient is back to baseline - Neurology will be available as needed going forward, please reach out if any additional questions or concerns arise   Lesleigh Noe MD-PhD [facility] [phone] Available 7 PM to 7 AM, outside of these hours please call Neurologist on call as listed on Amion.

## 2022-08-29 NOTE — Discharge Summary (Signed)
Physician Discharge Summary   Patient: Carla Bennett MRN: [id number] DOB: August 13, 1935  Admit date:     08/28/2022  Discharge date: 08/29/22  Discharge Physician: Murray Hodgkins   PCP: Marrian Salvage, FNP   Recommendations at discharge:   Dizziness, thought secondary to relative hypotension or potential cardiac arrhythmia currently on coverage. --Patient presenting after 3 episodes of dizziness at home.  Currently asymptomatic. Brain MRI negative for acute or subacute infarct. MRA head negative for intracranial LVO or significant stenosis.  Hypertensive here. --Plan outpatient event monitor, she has an appoint with Dr. Marlou Porch within the next month. 30 day event monitor ogiven left atrial enlargement on recent echocardiogram, and episodic events more concerning for cardiac than primary neurologic etiology    Borderline PR prolongation on EKG   Borderline hypercalcemia --This has been seen in the past including in 2000, 2019, 2018, 2017, 2016 --Significance unclear, recommend outpatient evaluation.  Follow-up PTH which is pending.  Discharge Diagnoses: Principal Problem:   Dizziness Active Problems:   Hypercalcemia   Essential hypertension   Chronic systolic CHF (congestive heart failure) (HCC)   CKD (chronic kidney disease) stage 3, GFR 30-59 ml/min (HCC)  Resolved Problems:   * No resolved hospital problems. *  Hospital Course: 87 year old woman PMH including CKD, hypertension, presented with dizziness, 3 discrete episodes.  Placed in observation for TIA evaluation.  Seen by neurology, given her history, as well as negative MRI and MRA of the brain, no evidence of vertebrobasilar insufficiency, events favored to be related to relative hypotension or potentially cardiac arrhythmia.  Patient remained asymptomatic, plans were made for discharge home with outpatient placement of loop recorder.  Dizziness, thought secondary to relative hypotension or potential cardiac  arrhythmia currently on coverage. --Patient presenting after 3 episodes of dizziness at home.  Currently asymptomatic. Brain MRI negative for acute or subacute infarct. MRA head negative for intracranial LVO or significant stenosis.   --Seen by neurology, neurology evaluation complete and unrevealing. --Plan outpatient event monitor, she has an appoint with Dr. Marlou Porch within the next month. 30 day event monitor ogiven left atrial enlargement on recent echocardiogram, and episodic events more concerning for cardiac than primary neurologic etiology  --Aspirin on discharge.  TSH within normal limits. --vestibular assessment was negative   Borderline PR prolongation on EKG   Borderline hypercalcemia --This has been seen in the past including in 2000, 2019, 2018, 2017, 2016 --Significance unclear, recommend outpatient evaluation.  Follow-up PTH which is pending.   History of HFrEF --Last echocardiogram in October showed LVEF 60-65%, no wall motion abnormalities.  Normal right ventricular systolic function.   Essential hypertension --Continue Coreg, lisinopril, spironolactone  Consultants:  Neurology  Procedures performed:  None   Disposition: Home Diet recommendation:  Discharge Diet Orders (From admission, onward)     Start     Ordered   08/29/22 0000  Diet general        08/29/22 1444           Regular diet DISCHARGE MEDICATION: Allergies as of 08/29/2022   No Known Allergies      Medication List     TAKE these medications    alendronate 70 MG tablet Commonly known as: FOSAMAX Take 70 mg by mouth once a week. Monday   aspirin EC 81 MG tablet Take 1 tablet (81 mg total) by mouth daily. Swallow whole.   calcium carbonate 500 MG chewable tablet Commonly known as: Tums Chew 2 tablets (400 mg of elemental calcium total) by mouth  3 times/day as needed-between meals & bedtime for indigestion or heartburn.   carvedilol 25 MG tablet Commonly known as: COREG Take 0.5  tablets (12.5 mg total) by mouth 2 (two) times daily.   hyoscyamine 0.125 MG tablet Commonly known as: LEVSIN TAKE 1-2 TABLET EVERY 4-6 HOURS AS NEEDED FOR ABDOMINAL PAIN   lisinopril 40 MG tablet Commonly known as: ZESTRIL Take 40 mg by mouth at bedtime.   loratadine 10 MG tablet Commonly known as: CLARITIN Take 10 mg by mouth daily.   spironolactone 25 MG tablet Commonly known as: ALDACTONE Take 0.5 tablets (12.5 mg total) by mouth daily.   VITAMIN B COMPLEX PO Take 1 tablet by mouth daily.        Follow-up Information     Marrian Salvage, FNP Follow up.   Specialty: Internal Medicine Why: As needed Contact information: [place] [address] [phone]         Jerline Pain, MD Follow up.   Specialty: Cardiology Why: keep scheduled appointment Contact information: [address] [address] [phone]                Feels fine, no dizziness  Discharge Exam: Danley Danker Weights   08/28/22 2258  Weight: 68 kg   Physical Exam Vitals reviewed.  Constitutional:      General: She is not in acute distress.    Appearance: She is not ill-appearing or toxic-appearing.  Cardiovascular:     Rate and Rhythm: Normal rate and regular rhythm.     Heart sounds: No murmur heard. Pulmonary:     Effort: Pulmonary effort is normal. No respiratory distress.     Breath sounds: No wheezing, rhonchi or rales.  Neurological:     General: No focal deficit present.     Mental Status: She is alert.  Psychiatric:        Mood and Affect: Mood normal.        Behavior: Behavior normal.      Condition at discharge: good  The results of significant diagnostics from this hospitalization (including imaging, microbiology, ancillary and laboratory) are listed below for reference.   Imaging Studies: VAS US CAROTID  Result Date: 08/29/2022 Carotid Arterial Duplex Study Patient Name:  Carla HUMAN  Bennett  Date of Exam:   08/29/2022 Medical Rec #: [id number]           Accession #:    [id number] Date of Birth: Mar 15, 1935           Patient Gender: F Patient Age:   87 years Exam Location:  [facility] Procedure:      VAS US CAROTID Referring Phys: Lesleigh Noe --------------------------------------------------------------------------------  Indications:       TIA. Risk Factors:      Hypertension, hyperlipidemia. Comparison Study:  No prior studies. Performing Technologist: Darlin Coco RDMS, RVT  Examination Guidelines: A complete evaluation includes B-mode imaging, spectral Doppler, color Doppler, and power Doppler as needed of all accessible portions of each vessel. Bilateral testing is considered an integral part of a complete examination. Limited examinations for reoccurring indications may be performed as noted.  Right Carotid Findings: +----------+--------+--------+--------+------------------+------------------+           PSV cm/sEDV cm/sStenosisPlaque DescriptionComments           +----------+--------+--------+--------+------------------+------------------+ CCA Prox  67      15  intimal thickening +----------+--------+--------+--------+------------------+------------------+ CCA Distal68      14                                intimal thickening +----------+--------+--------+--------+------------------+------------------+ ICA Prox  62      19      1-39%   heterogenous                         +----------+--------+--------+--------+------------------+------------------+ ICA Mid   62      23                                                   +----------+--------+--------+--------+------------------+------------------+ ICA Distal60      19                                                   +----------+--------+--------+--------+------------------+------------------+ ECA       75                                                            +----------+--------+--------+--------+------------------+------------------+ +----------+--------+-------+----------------+-------------------+           PSV cm/sEDV cmsDescribe        Arm Pressure (mmHG) +----------+--------+-------+----------------+-------------------+ [id number]            Multiphasic, WNL                    +----------+--------+-------+----------------+-------------------+ +---------+--------+--+--------+-+---------+ VertebralPSV cm/s25EDV cm/s5Antegrade +---------+--------+--+--------+-+---------+  Left Carotid Findings: +----------+--------+--------+--------+------------------+------------------+           PSV cm/sEDV cm/sStenosisPlaque DescriptionComments           +----------+--------+--------+--------+------------------+------------------+ CCA Prox  94      12                                                   +----------+--------+--------+--------+------------------+------------------+ CCA Mid   89      16                                intimal thickening +----------+--------+--------+--------+------------------+------------------+ CCA Distal59      14              heterogenous                         +----------+--------+--------+--------+------------------+------------------+ ICA Prox  72      22      1-39%   heterogenous                         +----------+--------+--------+--------+------------------+------------------+ ICA Mid   63      19                                                   +----------+--------+--------+--------+------------------+------------------+  ICA Distal58      20                                                   +----------+--------+--------+--------+------------------+------------------+ ECA       70                                                           +----------+--------+--------+--------+------------------+------------------+  +----------+--------+--------+----------------+-------------------+           PSV cm/sEDV cm/sDescribe        Arm Pressure (mmHG) +----------+--------+--------+----------------+-------------------+ [id number]             Multiphasic, WNL                    +----------+--------+--------+----------------+-------------------+ +---------+--------+--+--------+--+---------+ VertebralPSV cm/s50EDV cm/s15Antegrade +---------+--------+--+--------+--+---------+   Summary: Right Carotid: Velocities in the right ICA are consistent with a 1-39% stenosis. Left Carotid: Velocities in the left ICA are consistent with a 1-39% stenosis. Vertebrals:  Bilateral vertebral arteries demonstrate antegrade flow. Subclavians: Normal flow hemodynamics were seen in bilateral subclavian              arteries. *See table(s) above for measurements and observations.     Preliminary    MR BRAIN WO CONTRAST  Result Date: 08/29/2022 CLINICAL DATA:  Episodes of dizziness and nausea EXAM: MRI HEAD WITHOUT CONTRAST MRA HEAD WITHOUT CONTRAST TECHNIQUE: Multiplanar, multi-echo pulse sequences of the brain and surrounding structures were acquired without intravenous contrast. Angiographic images of the Circle of Willis were acquired using MRA technique without intravenous contrast. COMPARISON:  No prior MRI available FINDINGS: MRI HEAD FINDINGS Brain: No restricted diffusion to suggest acute or subacute infarct. No acute hemorrhage, mass, mass effect, or midline shift. No hemosiderin deposition to suggest remote hemorrhage. No hydrocephalus or extra-axial collection. Cerebral volume is within normal limits for age. T2 hyperintense signal in the periventricular white matter, likely the sequela of mild-to-moderate chronic small vessel ischemic disease. Vascular: Normal arterial flow voids. Skull and upper cervical spine: Normal marrow signal. Sinuses/Orbits: Clear paranasal sinuses. The orbits are unremarkable. Other: The mastoids  are well aerated. MRA HEAD FINDINGS Anterior circulation: Both internal carotid arteries are patent to the termini, without significant stenosis. A1 segments patent. Normal anterior communicating artery. Anterior cerebral arteries are patent to their distal aspects. No M1 stenosis or occlusion. Normal MCA bifurcations. Distal MCA branches perfused and symmetric. Posterior circulation: The right vertebral artery primarily supplies the right PICA, with a very diminutive continuing vessel. The left vertebral artery is patent to the vertebrobasilar junction without stenosis. Posterior inferior cerebral arteries patent bilaterally. Basilar patent to its distal aspect. Superior cerebellar arteries patent bilaterally. Patent P1 segments. PCAs perfused to their distal aspects without stenosis. The bilateral posterior communicating arteries are not definitively visualized. Anatomic variants: None significant IMPRESSION: 1. No acute intracranial process. No evidence of acute or subacute infarct. 2. No intracranial large vessel occlusion or significant stenosis. Electronically Signed   By: Merilyn Baba M.D.   On: 08/29/2022 03:15   MR ANGIO HEAD WO CONTRAST  Result Date: 08/29/2022 CLINICAL DATA:  Episodes of dizziness and nausea EXAM: MRI HEAD WITHOUT CONTRAST MRA HEAD WITHOUT CONTRAST TECHNIQUE:  Multiplanar, multi-echo pulse sequences of the brain and surrounding structures were acquired without intravenous contrast. Angiographic images of the Circle of Willis were acquired using MRA technique without intravenous contrast. COMPARISON:  No prior MRI available FINDINGS: MRI HEAD FINDINGS Brain: No restricted diffusion to suggest acute or subacute infarct. No acute hemorrhage, mass, mass effect, or midline shift. No hemosiderin deposition to suggest remote hemorrhage. No hydrocephalus or extra-axial collection. Cerebral volume is within normal limits for age. T2 hyperintense signal in the periventricular white matter,  likely the sequela of mild-to-moderate chronic small vessel ischemic disease. Vascular: Normal arterial flow voids. Skull and upper cervical spine: Normal marrow signal. Sinuses/Orbits: Clear paranasal sinuses. The orbits are unremarkable. Other: The mastoids are well aerated. MRA HEAD FINDINGS Anterior circulation: Both internal carotid arteries are patent to the termini, without significant stenosis. A1 segments patent. Normal anterior communicating artery. Anterior cerebral arteries are patent to their distal aspects. No M1 stenosis or occlusion. Normal MCA bifurcations. Distal MCA branches perfused and symmetric. Posterior circulation: The right vertebral artery primarily supplies the right PICA, with a very diminutive continuing vessel. The left vertebral artery is patent to the vertebrobasilar junction without stenosis. Posterior inferior cerebral arteries patent bilaterally. Basilar patent to its distal aspect. Superior cerebellar arteries patent bilaterally. Patent P1 segments. PCAs perfused to their distal aspects without stenosis. The bilateral posterior communicating arteries are not definitively visualized. Anatomic variants: None significant IMPRESSION: 1. No acute intracranial process. No evidence of acute or subacute infarct. 2. No intracranial large vessel occlusion or significant stenosis. Electronically Signed   By: Merilyn Baba M.D.   On: 08/29/2022 03:15   VAS US RENAL ARTERY DUPLEX  Result Date: 08/10/2022 ABDOMINAL VISCERAL Patient Name:  Carla Bennett  Date of Exam:   08/08/2022 Medical Rec #: [id number]           Accession #:    [id number] Date of Birth: 1935/02/20           Patient Gender: F Patient Age:   35 years Exam Location:  [facility] Procedure:      VAS US RENAL ARTERY DUPLEX Referring Phys: [id number] TESSA N CONTE -------------------------------------------------------------------------------- Indications: BP log reveals blood pressures in the morning and evenings elevated               and then midday dropping low. High Risk Factors: Hypertension, hyperlipidemia, no history of smoking. Other Factors: CHF and cardiomyopathy.                07/06/21, creatinine was 1.19 mg/dL. Comparison Study: CTA of the abdomen in 05/2016 showed atherosclerosis in the                   abdominal aorta and ostial renal arteries, without stenosis. Performing Technologist: Leavy Cella RDCS  Examination Guidelines: A complete evaluation includes B-mode imaging, spectral Doppler, color Doppler, and power Doppler as needed of all accessible portions of each vessel. Bilateral testing is considered an integral part of a complete examination. Limited examinations for reoccurring indications may be performed as noted.  Duplex Findings: +----------------------+--------+--------+------+--------+ Mesenteric            PSV cm/sEDV cm/sPlaqueComments +----------------------+--------+--------+------+--------+ Aorta Prox               86      19                  +----------------------+--------+--------+------+--------+ Aorta Distal             98  15                  +----------------------+--------+--------+------+--------+ Celiac Artery Proximal  181      50                  +----------------------+--------+--------+------+--------+ SMA Origin              153      24                  +----------------------+--------+--------+------+--------+ SMA Proximal            114      19                  +----------------------+--------+--------+------+--------+    +------------------+--------+--------+-------+ Right Renal ArteryPSV cm/sEDV cm/sComment +------------------+--------+--------+-------+ Origin               74      22           +------------------+--------+--------+-------+ Proximal             77      21           +------------------+--------+--------+-------+ Mid                  66      17           +------------------+--------+--------+-------+ Distal                69      20           +------------------+--------+--------+-------+ +-----------------+--------+--------+-------+ Left Renal ArteryPSV cm/sEDV cm/sComment +-----------------+--------+--------+-------+ Origin              42      9            +-----------------+--------+--------+-------+ Proximal           102      24           +-----------------+--------+--------+-------+ Mid                 89      22           +-----------------+--------+--------+-------+ Distal              46      10           +-----------------+--------+--------+-------+ +------------+--------+--------+----+-----------+--------+--------+----+ Right KidneyPSV cm/sEDV cm/sRI  Left KidneyPSV cm/sEDV cm/sRI   +------------+--------+--------+----+-----------+--------+--------+----+ Upper Pole  36      10      0.71Upper Pole 26      8       0.71 +------------+--------+--------+----+-----------+--------+--------+----+ Mid         28      7       0.75Mid        30      8       0.74 +------------+--------+--------+----+-----------+--------+--------+----+ Lower Pole  23      5       0.80Lower Pole 25      7       0.71 +------------+--------+--------+----+-----------+--------+--------+----+ Hilar       31      9       0.70Hilar      38      8       0.78 +------------+--------+--------+----+-----------+--------+--------+----+ +------------------+--------+------------------+--------+ Right Kidney              Left Kidney                +------------------+--------+------------------+--------+  RAR                       RAR                        +------------------+--------+------------------+--------+ RAR (manual)      0.9     RAR (manual)      1.2      +------------------+--------+------------------+--------+ Cortex                    Cortex                     +------------------+--------+------------------+--------+ Cortex thickness  10.00 mmCorex  thickness   10.00 mm +------------------+--------+------------------+--------+ Kidney length (cm)9.86    Kidney length (cm)10.13    +------------------+--------+------------------+--------+  Summary: Renal:  Right: Normal size right kidney. Normal right Resisitive Index.        Normal cortical thickness of right kidney. No evidence of        right renal artery stenosis. RRV flow present. Left:  Normal size of left kidney. Normal left Resistive Index.        Normal cortical thickness of the left kidney. No evidence of        left renal artery stenosis. LRV flow present.  *See table(s) above for measurements and observations.  Diagnosing physician: Carlyle Dolly MD  Electronically signed by Carlyle Dolly MD on 08/10/2022 at 4:57:06 PM.    Final    ECHOCARDIOGRAM COMPLETE  Result Date: 08/04/2022    ECHOCARDIOGRAM REPORT   Patient Name:   Carla Bennett Date of Exam: 08/04/2022 Medical Rec #:  [id number]          Height:       59.8 in Accession #:    [id number]         Weight:       156.0 lb Date of Birth:  Jul 03, 1935          BSA:          1.675 m Patient Age:    65 years           BP:           149/82 mmHg Patient Gender: F                  HR:           81 bpm. Exam Location:  [facility] Procedure: 2D Echo, Cardiac Doppler, Color Doppler and Strain Analysis Indications:    R55 Syncope  History:        Patient has prior history of Echocardiogram examinations, most                 recent 01/21/2017. Breast cancer. CKD, Signs/Symptoms:Murmur; Risk                 Factors:Hypertension.  Sonographer:    Marygrace Drought RCS Referring Phys: Martin  1. Left ventricular ejection fraction, by estimation, is 60 to 65%. The left ventricle has normal function. The left ventricle has no regional wall motion abnormalities. There is mild left ventricular hypertrophy. Left ventricular diastolic parameters were normal. The average left ventricular global longitudinal strain is -17.5 %. The  global longitudinal strain is normal.  2. Right ventricular systolic function is normal. The right ventricular size is normal. There is normal pulmonary artery systolic pressure.  3. Left atrial size was mildly dilated.  4. The mitral  valve is abnormal. Mild mitral valve regurgitation. No evidence of mitral stenosis.  5. The aortic valve is tricuspid. There is mild calcification of the aortic valve. There is mild thickening of the aortic valve. Aortic valve regurgitation is mild. Aortic valve sclerosis is present, with no evidence of aortic valve stenosis.  6. The inferior vena cava is normal in size with greater than 50% respiratory variability, suggesting right atrial pressure of 3 mmHg. FINDINGS  Left Ventricle: Left ventricular ejection fraction, by estimation, is 60 to 65%. The left ventricle has normal function. The left ventricle has no regional wall motion abnormalities. The average left ventricular global longitudinal strain is -17.5 %. The global longitudinal strain is normal. The left ventricular internal cavity size was normal in size. There is mild left ventricular hypertrophy. Left ventricular diastolic parameters were normal. Right Ventricle: The right ventricular size is normal. No increase in right ventricular wall thickness. Right ventricular systolic function is normal. There is normal pulmonary artery systolic pressure. The tricuspid regurgitant velocity is 2.55 m/s, and  with an assumed right atrial pressure of 3 mmHg, the estimated right ventricular systolic pressure is 95.6 mmHg. Left Atrium: Left atrial size was mildly dilated. Right Atrium: Right atrial size was normal in size. Pericardium: Trivial pericardial effusion is present. The pericardial effusion is lateral to the left ventricle. Mitral Valve: The mitral valve is abnormal. There is mild thickening of the mitral valve leaflet(s). There is mild calcification of the mitral valve leaflet(s). Mild mitral valve regurgitation. No evidence  of mitral valve stenosis. Tricuspid Valve: The tricuspid valve is normal in structure. Tricuspid valve regurgitation is trivial. No evidence of tricuspid stenosis. Aortic Valve: The aortic valve is tricuspid. There is mild calcification of the aortic valve. There is mild thickening of the aortic valve. Aortic valve regurgitation is mild. Aortic regurgitation PHT measures 2123 msec. Aortic valve sclerosis is present, with no evidence of aortic valve stenosis. Pulmonic Valve: The pulmonic valve was normal in structure. Pulmonic valve regurgitation is trivial. No evidence of pulmonic stenosis. Aorta: The aortic root is normal in size and structure. Venous: The inferior vena cava is normal in size with greater than 50% respiratory variability, suggesting right atrial pressure of 3 mmHg. IAS/Shunts: No atrial level shunt detected by color flow Doppler.  LEFT VENTRICLE PLAX 2D LVIDd:         4.00 cm   Diastology LVIDs:         2.60 cm   LV e' medial:    5.55 cm/s LV PW:         1.20 cm   LV E/e' medial:  16.6 LV IVS:        1.30 cm   LV e' lateral:   6.74 cm/s LVOT diam:     2.00 cm   LV E/e' lateral: 13.7 LV SV:         73 LV SV Index:   43        2D Longitudinal Strain LVOT Area:     3.14 cm  2D Strain GLS (A2C):   -17.7 %                          2D Strain GLS (A3C):   -16.6 %                          2D Strain GLS (A4C):   -18.2 %  2D Strain GLS Avg:     -17.5 % RIGHT VENTRICLE RV Basal diam:  3.30 cm RV S prime:     21.10 cm/s TAPSE (M-mode): 2.1 cm RVSP:           29.0 mmHg LEFT ATRIUM             Index        RIGHT ATRIUM           Index LA diam:        4.20 cm 2.51 cm/m   RA Pressure: 3.00 mmHg LA Vol (A2C):   49.5 ml 29.56 ml/m  RA Area:     11.20 cm LA Vol (A4C):   47.8 ml 28.54 ml/m  RA Volume:   25.20 ml  15.05 ml/m LA Biplane Vol: 48.3 ml 28.84 ml/m  AORTIC VALVE LVOT Vmax:   103.00 cm/s LVOT Vmean:  75.000 cm/s LVOT VTI:    0.231 m AI PHT:      2123 msec  AORTA Ao Root diam:  2.90 cm Ao Asc diam:  3.40 cm MITRAL VALVE                TRICUSPID VALVE MV Area (PHT):              TR Peak grad:   26.0 mmHg MV Decel Time:              TR Vmax:        255.00 cm/s MV E velocity: 92.20 cm/s   Estimated RAP:  3.00 mmHg MV A velocity: 102.00 cm/s  RVSP:           29.0 mmHg MV E/A ratio:  0.90                             SHUNTS                             Systemic VTI:  0.23 m                             Systemic Diam: 2.00 cm Jenkins Rouge MD Electronically signed by Jenkins Rouge MD Signature Date/Time: 08/04/2022/2:09:46 PM    Final     Microbiology: Results for orders placed or performed during the hospital encounter of 09/07/21  Urine Culture     Status: None   Collection Time: 09/07/21  2:28 PM   Specimen: Urine, Clean Catch  Result Value Ref Range Status   Specimen Description   Final    URINE, CLEAN CATCH Performed at [facility], [address] [place], [place] [address]    Special Requests   Final    NONE Performed at [facility], [address] [place], [place] [address]    Culture   Final    NO GROWTH Performed at [facility], [place] [address] [place], [place] [address]    Report Status 09/09/2021 FINAL  Final    Labs: CBC: Recent Labs  Lab 08/29/22 0000  WBC 9.0  NEUTROABS 6.7  HGB 14.1  HCT 44.8  MCV 90.7  PLT 132*   Basic Metabolic Panel: Recent Labs  Lab 08/29/22 0000  NA 139  K 4.1  CL 106  CO2 22  GLUCOSE 112*  BUN 14  CREATININE 1.08*  CALCIUM 10.5*  Liver Function Tests: Recent Labs  Lab 08/29/22 0000  AST 19  ALT 14  ALKPHOS 62  BILITOT 0.5  PROT 6.9  ALBUMIN 3.7   CBG: No results for input(s): "GLUCAP" in the last 168 hours.  Discharge time spent: greater than 30 minutes.  Signed: Murray Hodgkins, MD [facility] 08/29/2022

## 2022-08-29 NOTE — ED Notes (Signed)
Oral care green mouth swab given to pt

## 2022-08-29 NOTE — H&P (Signed)
History and Physical    Paxton Kanaan Sailors NWG:[id number] DOB: November 25, 1934 DOA: 08/28/2022  PCP: Marrian Salvage, FNP  Patient coming from: Home  Chief Complaint: Dizziness  HPI: Carla Bennett is a 87 y.o. female with medical history significant of HFrEF, hypertension, breast cancer completed treatment in 2017, gout, CKD stage IIIa presented to the ED after 3 episodes of dizziness.  Blood pressure 210/106 on arrival.  Labs showing no leukocytosis or anemia, blood ethanol level undetectable, glucose 112, creatinine 1.0 (stable), calcium 10.5, UDS negative.  Brain MRI negative for acute or subacute infarct. MRA head negative for intracranial LVO or significant stenosis. Patient was given aspirin 324 mg.  Neurology consulted by ED physician and [facility] called to admit for TIA work-up.  Patient states yesterday evening around 7 PM she was playing cards with her friends when all of a sudden she felt very dizzy and nauseous.  She felt like she was going to pass out.  She then laid down on her couch and started feeling better after a few minutes.  She went back to playing cards.  15 minutes later, symptoms recurred and again resolved after she laid down for a few minutes.  Again she went back to playing cards and 15 minutes later she had the same symptoms for the third time which prompted her to seek medical attention.  She does recall one of her friends telling her that she was weak on her right side during these episodes.  She denies any room spinning sensation.  She has chronic hearing loss for which she uses hearing aids.  Denies tinnitus.  Denies any palpitations or chest pain during these episodes.  She reports chronic dry cough related to seasonal allergies/postnasal drip.  Denies fevers or shortness of breath.  No other complaints.  Review of Systems:  Review of Systems  All other systems reviewed and are negative.   Past Medical History:  Diagnosis Date   Allergy    Aortic  regurgitation    Arthritis    hands, neck   Basal cell carcinoma    Benign neoplasm of colon    Breast cancer of upper-outer quadrant of left female breast ([place]) 02/23/2015   4'16-left breast cancer-surgery, chemo, radiation-[name] follows every 3 months   Cataract    beginning stages-monitoring by MD   Cholelithiasis    resolved with surgery   Chronic systolic CHF (congestive heart failure) ([place]) 02/26/2016   A.  Echo 02/21/16:  Mild LVH, EF 20-25%, diff HK, Gr 3 DD, mod AI, mod to severe MR, mod LAE, mod redcued RVSF, mild RAE, mod TR, PASP 53 mmHg, small pericardial effusion, mod L pleural effusion  //  B. Echo 6/17:  Mild LVH EF 30-35%, diffuse HK, grade 1 diastolic dysfunction, trivial AI, MAC, mild LAE, normal RVSF, trivial pericardial effusion     Cystocele    Diverticulitis    Diverticulosis of colon (without mention of hemorrhage)    Gout    resolved -   Hearing loss    some hearing loss in right ear- no hearing aids   Heart murmur    Hematuria    negative urology work up   History of blood transfusion 1971   x 1 with D&C -    Hx of abnormal cervical Pap smear    LEEP-CIN I, negative margins and ECC   Hypercalcemia    Hypertension    Mitral regurgitation    Neuromuscular disorder (HCC)    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   OA (osteoarthritis) of hip    right   Other issue of medical certificates    uterine myomata   Parathyroid abnormality ([place])    being followed Dr. Barbette Hair   Radiation 10/01/15-11/16/15   left breast axillary and supraclavicular reigion 45 gray, left breast 50.4 gray, lumpectomy cavity boost 10 gray   Renal disease 3/16   Stage 3 kidney disease-seeing Dr Buddy Duty and Dr Marval Regal   Uterine prolaps    resolved after surgery   Vitamin D deficiency     Past Surgical History:  Procedure Laterality Date   BREAST LUMPECTOMY Left 08/2015   CHOLECYSTECTOMY     COLONOSCOPY  01/18/2013   [name]   COLONOSCOPY N/A 06/26/2016   Procedure: COLONOSCOPY;   Surgeon: Irene Shipper, MD;  Location: [facility];  Service: Endoscopy;  Laterality: N/A;   CYSTOCELE REPAIR  08/2010   Rectocele, vault prolapse   DILATION AND CURETTAGE OF UTERUS     x2   PORT-A-CATH REMOVAL Right 08/23/2015   Procedure: REMOVAL PORT-A-CATH;  Surgeon: Erroll Luna, MD;  Location: [facility] [place];  Service: General;  Laterality: Right;   PORTACATH PLACEMENT Right 03/20/2015   Procedure: INSERTION PORT-A-CATH;  Surgeon: Erroll Luna, MD;  Location: [facility];  Service: General;  Laterality: Right;   RADIOACTIVE SEED GUIDED PARTIAL MASTECTOMY WITH AXILLARY SENTINEL LYMPH NODE BIOPSY Left 08/23/2015   Procedure: LEFT BREAST PARTIAL MASTECTOMY WITH SENTINEL LYMPH NODE [place];  Surgeon: Erroll Luna, MD;  Location: [facility];  Service: General;  Laterality: Left;   TONSILLECTOMY AND ADENOIDECTOMY     TOTAL VAGINAL HYSTERECTOMY     secondary to prolapse, ovaries not removed     reports that she has never smoked. She has never used smokeless tobacco. She reports current alcohol use. She reports that she does not use drugs.  No Known Allergies  Family History  Problem Relation Age of Onset   Parkinson's disease Mother    Dementia Mother    Lung cancer Father 59   Breast cancer Sister 23   Multiple sclerosis Sister    Prostate cancer Maternal Uncle 77   Stomach cancer Maternal Uncle    Cancer Maternal Uncle 71       GI cancer - ? stomach   Cancer Cousin 67       mat female first cousin (son of mat uncle with GI cancer) with ureter cancer   Colon cancer Neg Hx    Esophageal cancer Neg Hx    Rectal cancer Neg Hx    Colon polyps Neg Hx     Prior to Admission medications   Medication Sig Start Date End Date Taking? Authorizing Provider  alendronate (FOSAMAX) 70 MG tablet Take 70 mg by mouth once a week. Monday 08/18/22  Yes [provider]  B Complex Vitamins (VITAMIN B COMPLEX PO) Take 1 tablet by mouth daily.    Yes [provider]  calcium carbonate (TUMS) 500 MG chewable tablet Chew 2 tablets (400 mg of elemental calcium total) by mouth 3 times/day as needed-between meals & bedtime for indigestion or heartburn. 11/17/18  Yes Nicholas Lose, MD  carvedilol (COREG) 25 MG tablet Take 0.5 tablets (12.5 mg total) by mouth 2 (two) times daily. 07/18/22  Yes Conte, Tessa N, PA-C  hyoscyamine (LEVSIN) 0.125 MG tablet TAKE 1-2 TABLET EVERY 4-6 HOURS AS NEEDED FOR ABDOMINAL PAIN 02/27/22  Yes Irene Shipper, MD  lisinopril (ZESTRIL) 40 MG tablet Take 40  mg by mouth at bedtime.   Yes [provider]  loratadine (CLARITIN) 10 MG tablet Take 10 mg by mouth daily.   Yes [provider]  spironolactone (ALDACTONE) 25 MG tablet Take 0.5 tablets (12.5 mg total) by mouth daily. 10/30/21  Yes Jerline Pain, MD    Physical Exam: Vitals:   08/28/22 2345 08/29/22 0100 08/29/22 0130 08/29/22 0330  BP: (!) 193/98 (!) 166/92 (!) 174/90 (!) 168/98  Pulse: 82 94 92 82  Resp: (!) _0 Temp:    98.5 F (36.9 C)  TempSrc:      SpO2: 96% 94% 96% 94%  Weight:      Height:        Physical Exam Vitals reviewed.  Constitutional:      General: She is not in acute distress. HENT:     Head: Normocephalic and atraumatic.  Eyes:     Extraocular Movements: Extraocular movements intact.  Cardiovascular:     Rate and Rhythm: Normal rate and regular rhythm.     Pulses: Normal pulses.  Pulmonary:     Effort: Pulmonary effort is normal. No respiratory distress.     Breath sounds: Normal breath sounds. No wheezing or rales.  Abdominal:     General: Bowel sounds are normal. There is no distension.     Palpations: Abdomen is soft.     Tenderness: There is no abdominal tenderness.  Musculoskeletal:     Cervical back: Normal range of motion.     Right lower leg: No edema.     Left lower leg: No edema.  Skin:    General: Skin is warm and dry.  Neurological:     General: No focal deficit present.     Mental  Status: She is alert and oriented to person, place, and time.     Cranial Nerves: No cranial nerve deficit.     Sensory: No sensory deficit.     Motor: No weakness.     Labs on Admission: I have personally reviewed following labs and imaging studies  CBC: Recent Labs  Lab 08/29/22 0000  WBC 9.0  NEUTROABS 6.7  HGB 14.1  HCT 44.8  MCV 90.7  PLT 915*   Basic Metabolic Panel: Recent Labs  Lab 08/29/22 0000  NA 139  K 4.1  CL 106  CO2 22  GLUCOSE 112*  BUN 14  CREATININE 1.08*  CALCIUM 10.5*   GFR: Estimated Creatinine Clearance: 31.9 mL/min (A) (by C-G formula based on SCr of 1.08 mg/dL (H)). Liver Function Tests: Recent Labs  Lab 08/29/22 0000  AST 19  ALT 14  ALKPHOS 62  BILITOT 0.5  PROT 6.9  ALBUMIN 3.7   No results for input(s): "LIPASE", "AMYLASE" in the last 168 hours. No results for input(s): "AMMONIA" in the last 168 hours. Coagulation Profile: Recent Labs  Lab 08/29/22 0000  INR 1.0   Cardiac Enzymes: No results for input(s): "CKTOTAL", "CKMB", "CKMBINDEX", "TROPONINI" in the last 168 hours. BNP (last 3 results) No results for input(s): "PROBNP" in the last 8760 hours. HbA1C: No results for input(s): "HGBA1C" in the last 72 hours. CBG: No results for input(s): "GLUCAP" in the last 168 hours. Lipid Profile: No results for input(s): "CHOL", "HDL", "LDLCALC", "TRIG", "CHOLHDL", "LDLDIRECT" in the last 72 hours. Thyroid Function Tests: No results for input(s): "TSH", "T4TOTAL", "FREET4", "T3FREE", "THYROIDAB" in the last 72 hours. Anemia Panel: No results for input(s): "VITAMINB12", "FOLATE", "FERRITIN", "TIBC", "IRON", "RETICCTPCT" in the last 72 hours.  Urine analysis:    Component Value Date/Time   COLORURINE COLORLESS (A) 08/29/2022 0003   APPEARANCEUR CLEAR 08/29/2022 0003   LABSPEC 1.004 (L) 08/29/2022 0003   PHURINE 8.0 08/29/2022 0003   GLUCOSEU NEGATIVE 08/29/2022 0003   HGBUR NEGATIVE 08/29/2022 0003   BILIRUBINUR NEGATIVE  08/29/2022 0003   KETONESUR NEGATIVE 08/29/2022 0003   PROTEINUR NEGATIVE 08/29/2022 0003   NITRITE NEGATIVE 08/29/2022 0003   LEUKOCYTESUR NEGATIVE 08/29/2022 0003    Radiological Exams on Admission: MR BRAIN WO CONTRAST  Result Date: 08/29/2022 CLINICAL DATA:  Episodes of dizziness and nausea EXAM: MRI HEAD WITHOUT CONTRAST MRA HEAD WITHOUT CONTRAST TECHNIQUE: Multiplanar, multi-echo pulse sequences of the brain and surrounding structures were acquired without intravenous contrast. Angiographic images of the Circle of Willis were acquired using MRA technique without intravenous contrast. COMPARISON:  No prior MRI available FINDINGS: MRI HEAD FINDINGS Brain: No restricted diffusion to suggest acute or subacute infarct. No acute hemorrhage, mass, mass effect, or midline shift. No hemosiderin deposition to suggest remote hemorrhage. No hydrocephalus or extra-axial collection. Cerebral volume is within normal limits for age. T2 hyperintense signal in the periventricular white matter, likely the sequela of mild-to-moderate chronic small vessel ischemic disease. Vascular: Normal arterial flow voids. Skull and upper cervical spine: Normal marrow signal. Sinuses/Orbits: Clear paranasal sinuses. The orbits are unremarkable. Other: The mastoids are well aerated. MRA HEAD FINDINGS Anterior circulation: Both internal carotid arteries are patent to the termini, without significant stenosis. A1 segments patent. Normal anterior communicating artery. Anterior cerebral arteries are patent to their distal aspects. No M1 stenosis or occlusion. Normal MCA bifurcations. Distal MCA branches perfused and symmetric. Posterior circulation: The right vertebral artery primarily supplies the right PICA, with a very diminutive continuing vessel. The left vertebral artery is patent to the vertebrobasilar junction without stenosis. Posterior inferior cerebral arteries patent bilaterally. Basilar patent to its distal aspect. Superior  cerebellar arteries patent bilaterally. Patent P1 segments. PCAs perfused to their distal aspects without stenosis. The bilateral posterior communicating arteries are not definitively visualized. Anatomic variants: None significant IMPRESSION: 1. No acute intracranial process. No evidence of acute or subacute infarct. 2. No intracranial large vessel occlusion or significant stenosis. Electronically Signed   By: Merilyn Baba M.D.   On: 08/29/2022 03:15   MR ANGIO HEAD WO CONTRAST  Result Date: 08/29/2022 CLINICAL DATA:  Episodes of dizziness and nausea EXAM: MRI HEAD WITHOUT CONTRAST MRA HEAD WITHOUT CONTRAST TECHNIQUE: Multiplanar, multi-echo pulse sequences of the brain and surrounding structures were acquired without intravenous contrast. Angiographic images of the Circle of Willis were acquired using MRA technique without intravenous contrast. COMPARISON:  No prior MRI available FINDINGS: MRI HEAD FINDINGS Brain: No restricted diffusion to suggest acute or subacute infarct. No acute hemorrhage, mass, mass effect, or midline shift. No hemosiderin deposition to suggest remote hemorrhage. No hydrocephalus or extra-axial collection. Cerebral volume is within normal limits for age. T2 hyperintense signal in the periventricular white matter, likely the sequela of mild-to-moderate chronic small vessel ischemic disease. Vascular: Normal arterial flow voids. Skull and upper cervical spine: Normal marrow signal. Sinuses/Orbits: Clear paranasal sinuses. The orbits are unremarkable. Other: The mastoids are well aerated. MRA HEAD FINDINGS Anterior circulation: Both internal carotid arteries are patent to the termini, without significant stenosis. A1 segments patent. Normal anterior communicating artery. Anterior cerebral arteries are patent to their distal aspects. No M1 stenosis or occlusion. Normal MCA bifurcations. Distal MCA branches perfused and symmetric. Posterior circulation: The right vertebral artery primarily  supplies the right PICA,  with a very diminutive continuing vessel. The left vertebral artery is patent to the vertebrobasilar junction without stenosis. Posterior inferior cerebral arteries patent bilaterally. Basilar patent to its distal aspect. Superior cerebellar arteries patent bilaterally. Patent P1 segments. PCAs perfused to their distal aspects without stenosis. The bilateral posterior communicating arteries are not definitively visualized. Anatomic variants: None significant IMPRESSION: 1. No acute intracranial process. No evidence of acute or subacute infarct. 2. No intracranial large vessel occlusion or significant stenosis. Electronically Signed   By: Merilyn Baba M.D.   On: 08/29/2022 03:15    EKG: Independently reviewed.  Sinus rhythm with borderline PR prolongation, LAFB.  PR interval has increased compared to prior tracings.  Assessment and Plan  Dizziness Patient presenting after 3 episodes of dizziness at home.  Currently asymptomatic. Brain MRI negative for acute or subacute infarct. MRA head negative for intracranial LVO or significant stenosis.  Symptoms could be due to TIA versus possible peripheral vertigo.  ?Arrhythmia but patient denies any palpitations and reportedly heart rate was normal with EMS when she was symptomatic.  Blood ethanol level undetectable.  UDS negative. -Neurology consulted -Telemetry monitoring -Hemoglobin A1c, fasting lipid panel -No repeat echocardiogram ordered as it was done less than a month ago.  Will defer to neurology. -Patient received aspirin 324 mg in the ED.  Further recommendations per neurology. -Frequent neurochecks  -PT consulted for vestibular evaluation -Fall precautions  Borderline PR prolongation on EKG -Cardiac monitoring -Check TSH  Borderline hypercalcemia -Check ionized calcium level, PTH, vitamin D level  History of HFrEF EF was 20 to 25% in 2017, improved to 60 to 65% on recent echo done 08/04/2022 with normal diastolic  parameters.  No signs of volume overload on exam. -Continue to monitor volume status  Hypertension Recorded blood pressure 210/106 on arrival to the ED, now improved without administration of any antihypertensives. -Continue Coreg, lisinopril, spironolactone  CKD stage IIIa -Creatinine stable.  DVT prophylaxis: Lovenox Code Status: Full Code (discussed with the patient) Family Communication: No family available at this time. Consults called: Neurology Level of care: Telemetry bed Admission status: It is my clinical opinion that referral for OBSERVATION is reasonable and necessary in this patient based on the above information provided. The aforementioned taken together are felt to place the patient at high risk for further clinical deterioration. However, it is anticipated that the patient may be medically stable for discharge from the hospital within 24 to 48 hours.   Shela Leff MD [facility]  If 7PM-7AM, please contact night-coverage [email or web address]  08/29/2022, 4:07 AM

## 2022-08-29 NOTE — Evaluation (Signed)
Physical Therapy Evaluation Patient Details Name: Carla Bennett MRN: [id number] DOB: August 19, 1935 Today's Date: 08/29/2022  History of Present Illness  Pt is 87 yo female admitted 08/28/22 with episodes of weakness/dizziness.  Noted pt describes as weakness/pale that improved with laying down, Reports her BP normally 140 and if goes below 120 feels weak, MRI head was negative.  Per neuro favor events relative to hypotension or potentially cardiac in nature.   Pt with hx of HTN, CKD, aortic regurgitation, breast CA, R hip OA, R hearing loss.  Clinical Impression   Pt admitted with above diagnosis.  She was mod I to supervision with all PT activities.  Vestibular/vertigo screen was negative and symptoms more consistent with cardiac.  Orthostatic BP was stable with PT.  Strength and balance were good.  Pt appears to be at her baseline.  No further acute PT needs.        Recommendations for follow up therapy are one component of a multi-disciplinary discharge planning process, led by the attending physician.  Recommendations may be updated based on patient status, additional functional criteria and insurance authorization.  Follow Up Recommendations No PT follow up      Assistance Recommended at Discharge PRN  Patient can return home with the following       Equipment Recommendations None recommended by PT  Recommendations for Other Services       Functional Status Assessment Patient has not had a recent decline in their functional status     Precautions / Restrictions Precautions Precautions: None      Mobility  Bed Mobility Overal bed mobility: Needs Assistance Bed Mobility: Supine to Sit, Sit to Supine     Supine to sit: Modified independent (Device/Increase time) Sit to supine: Modified independent (Device/Increase time)        Transfers Overall transfer level: Needs assistance Equipment used: None Transfers: Sit to/from Stand Sit to Stand: Supervision            General transfer comment: mildly impulsive with lines but steady    Ambulation/Gait Ambulation/Gait assistance: Supervision Gait Distance (Feet): 200 Feet Assistive device: None Gait Pattern/deviations: WFL(Within Functional Limits) Gait velocity: fast     General Gait Details: steady gait with normal pattern  Stairs            Wheelchair Mobility    Modified Rankin (Stroke Patients Only)       Balance Overall balance assessment: Needs assistance, Independent   Sitting balance-Leahy Scale: Normal     Standing balance support: No upper extremity supported Standing balance-Leahy Scale: Good               High level balance activites: Side stepping, Backward walking, Direction changes, Turns, Sudden stops, Head turns, Other (comment) (marching, navigating around objects, turning in circle) High Level Balance Comments: Did well with all above, no LOB but did drift slightly with head turns L/R (pt turning head quickly)             Pertinent Vitals/Pain Pain Assessment Pain Assessment: No/denies pain    Home Living Family/patient expects to be discharged to:: Private residence Living Arrangements: Alone Available Help at Discharge: Family;Available PRN/intermittently;Friend(s) Type of Home: House (townhouse) Home Access: Other (comment) (threshold)       Home Layout: One level Home Equipment: Cane - single point;Shower seat - built in;Grab bars - tub/shower;Hand held shower head      Prior Function Prior Level of Function : Independent/Modified Independent;Driving  Mobility Comments: ambulates in community without difficulty; no AD; no falls ADLs Comments: independent with adls and iadls     Hand Dominance        Extremity/Trunk Assessment   Upper Extremity Assessment Upper Extremity Assessment: Overall WFL for tasks assessed    Lower Extremity Assessment Lower Extremity Assessment: Overall WFL for tasks assessed     Cervical / Trunk Assessment Cervical / Trunk Assessment: Normal  Communication   Communication: No difficulties  Cognition Arousal/Alertness: Awake/alert Behavior During Therapy: WFL for tasks assessed/performed Overall Cognitive Status: No family/caregiver present to determine baseline cognitive functioning Area of Impairment: Memory                     Memory: Decreased short-term memory         General Comments: Asked 3 times when stress test would be (potentially just eager to have it complete due to NPO)        General Comments   Vestibular: -History: described symptoms of weakness but not spinning; improved with laying down -EOEM: intact, no nystagmus -Head Turn: no nystagmus or dizziness -Kohl's and Horizontal roll: negative   Exercises     Assessment/Plan    PT Assessment Patient does not need any further PT services  PT Problem List         PT Treatment Interventions      PT Goals (Current goals can be found in the Care Plan section)  Acute Rehab PT Goals Patient Stated Goal: get stress test and eat PT Goal Formulation: All assessment and education complete, DC therapy    Frequency       Co-evaluation               AM-PAC PT "6 Clicks" Mobility  Outcome Measure Help needed turning from your back to your side while in a flat bed without using bedrails?: None Help needed moving from lying on your back to sitting on the side of a flat bed without using bedrails?: None Help needed moving to and from a bed to a chair (including a wheelchair)?: None Help needed standing up from a chair using your arms (e.g., wheelchair or bedside chair)?: None Help needed to walk in hospital room?: A Little Help needed climbing 3-5 steps with a railing? : A Little 6 Click Score: 22    End of Session Equipment Utilized During Treatment: Gait belt Activity Tolerance: Patient tolerated treatment well Patient left: in bed;with call bell/phone  within reach (in ED) Nurse Communication: Mobility status PT Visit Diagnosis: Dizziness and giddiness (R42)    Time: 3875-6433 PT Time Calculation (min) (ACUTE ONLY): 25 min   Charges:   PT Evaluation $PT Eval Low Complexity: 1 Low PT Treatments $Gait Training: 8-22 mins        Abran Richard, PT Acute Rehab [facility] [phone]   Karlton Lemon 08/29/2022, 1:24 PM

## 2022-08-29 NOTE — Progress Notes (Signed)
Carotid duplex bilateral study completed.   Please see CV Proc for preliminary results.   Jalexis Breed, RDMS, RVT  

## 2022-08-30 LAB — CALCIUM, IONIZED: Calcium, Ionized, Serum: 6.1 mg/dL — ABNORMAL HIGH (ref 4.5–5.6)

## 2022-08-30 LAB — PARATHYROID HORMONE, INTACT (NO CA): PTH: 46 pg/mL (ref 15–65)

## 2022-09-01 ENCOUNTER — Telehealth: Payer: Self-pay | Admitting: Cardiology

## 2022-09-01 DIAGNOSIS — R42 Dizziness and giddiness: Secondary | ICD-10-CM

## 2022-09-01 NOTE — Telephone Encounter (Signed)
Pt was recently in the ED  (08/29/22)  Hospital Course: 87 year old woman PMH including CKD, hypertension, presented with dizziness, 3 discrete episodes.  Placed in observation for TIA evaluation.  Seen by neurology, given her history, as well as negative MRI and MRA of the brain, no evidence of vertebrobasilar insufficiency, events favored to be related to relative hypotension or potentially cardiac arrhythmia.  Patient remained asymptomatic, plans were made for discharge home with outpatient placement of loop recorder.   Dizziness, thought secondary to relative hypotension or potential cardiac arrhythmia currently on coverage. --Patient presenting after 3 episodes of dizziness at home.  Currently asymptomatic. Brain MRI negative for acute or subacute infarct. MRA head negative for intracranial LVO or significant stenosis.   --Seen by neurology, neurology evaluation complete and unrevealing. --Plan outpatient event monitor, she has an appoint with Dr. Marlou Porch within the next month. 30 day event monitor ogiven left atrial enlargement on recent echocardiogram, and episodic events more concerning for cardiac than primary neurologic etiology  --Aspirin on discharge.  TSH within normal limits. --vestibular assessment was negative  Pt is asking about when her f/u should be and if she needs to wear  an event monitor.  Advised I will have Dr Marlou Porch to review and call her back with any new orders.

## 2022-09-01 NOTE — Telephone Encounter (Signed)
Pt was seen in ED and she states they did not tell her how to f/u after. She is not sure if she should wait until December to see Dr. Marlou Porch or f/u with APP sooner. Please advise.

## 2022-09-02 ENCOUNTER — Ambulatory Visit: Payer: Medicare Other | Attending: Cardiology

## 2022-09-02 ENCOUNTER — Other Ambulatory Visit: Payer: Self-pay | Admitting: Cardiology

## 2022-09-02 DIAGNOSIS — R002 Palpitations: Secondary | ICD-10-CM

## 2022-09-03 ENCOUNTER — Ambulatory Visit: Payer: Medicare Other

## 2022-09-03 NOTE — Telephone Encounter (Signed)
Please order Zio Thanks Have her follow up in one month Candee Furbish, MD    Order placed for zio per Dr Marlou Porch.

## 2022-09-03 NOTE — Progress Notes (Unsigned)
Enrolled for [employer] to mail a ZIO XT long term holter monitor to the patients address on file.  

## 2022-09-03 NOTE — Telephone Encounter (Signed)
Pt was ordered to wear a 30 day event monitor by Cecilie Kicks, NP yesterday.  Will cancel order for zio monitor since 30 day event has already been mailed out per documentation.

## 2022-09-05 DIAGNOSIS — R002 Palpitations: Secondary | ICD-10-CM

## 2022-09-09 DIAGNOSIS — N1831 Chronic kidney disease, stage 3a: Secondary | ICD-10-CM | POA: Diagnosis not present

## 2022-09-09 DIAGNOSIS — I129 Hypertensive chronic kidney disease with stage 1 through stage 4 chronic kidney disease, or unspecified chronic kidney disease: Secondary | ICD-10-CM | POA: Diagnosis not present

## 2022-09-09 DIAGNOSIS — N2581 Secondary hyperparathyroidism of renal origin: Secondary | ICD-10-CM | POA: Diagnosis not present

## 2022-09-09 DIAGNOSIS — I5020 Unspecified systolic (congestive) heart failure: Secondary | ICD-10-CM | POA: Diagnosis not present

## 2022-09-09 DIAGNOSIS — N390 Urinary tract infection, site not specified: Secondary | ICD-10-CM | POA: Diagnosis not present

## 2022-09-10 LAB — CBC AND DIFFERENTIAL
HCT: 44 (ref 36–46)
Hemoglobin: 14.7 (ref 12.0–16.0)
Platelets: 413 10*3/uL — AB (ref 150–400)
WBC: 12.9

## 2022-09-10 LAB — BASIC METABOLIC PANEL
BUN: 17 (ref 4–21)
CO2: 25 — AB (ref 13–22)
Chloride: 101 (ref 99–108)
Creatinine: 1.1 (ref 0.5–1.1)
Glucose: 88
Potassium: 4.8 mEq/L (ref 3.5–5.1)
Sodium: 136 — AB (ref 137–147)

## 2022-09-10 LAB — COMPREHENSIVE METABOLIC PANEL
Calcium: 11.2 — AB (ref 8.7–10.7)
eGFR: 48

## 2022-09-10 LAB — CBC: RBC: 5.03 (ref 3.87–5.11)

## 2022-09-22 ENCOUNTER — Telehealth: Payer: Self-pay | Admitting: Cardiology

## 2022-09-22 NOTE — Telephone Encounter (Signed)
Spoke with pt who received both a 14 day zio as well as a 30 day event monitor.   She was wearing the zio already when she received the event.  Pt asking if she needs to wear both.  Advised pt both were ordered but one was ordered when she was seen in the hospital by Cecilie Kicks, NP and the zio ordered by Dr Marlou Porch in phone note from 09/03/22.  Advised pt to only wear one monitor.  She has worn the zio for 14 days and put it in the mail this past Saturday.  She will await the results of the monitor.

## 2022-09-22 NOTE — Telephone Encounter (Signed)
Pt would like a callback regarding Heart Monitor. Please advise

## 2022-09-29 ENCOUNTER — Ambulatory Visit: Payer: Medicare Other | Admitting: Cardiology

## 2022-09-29 DIAGNOSIS — R42 Dizziness and giddiness: Secondary | ICD-10-CM | POA: Diagnosis not present

## 2022-09-30 NOTE — Progress Notes (Unsigned)
Patient enrolled for both cardiac event monitor and later zio xt monitor.  Originally ZIO XT order cancelled but not in time to stop shipment.  Patient received both monitors and applied the ZIO. Cardiac event monitor order changed to [id number] long term monitor and results imported manually for review.

## 2022-11-08 DIAGNOSIS — R319 Hematuria, unspecified: Secondary | ICD-10-CM | POA: Diagnosis not present

## 2022-11-08 DIAGNOSIS — R300 Dysuria: Secondary | ICD-10-CM | POA: Diagnosis not present

## 2022-11-12 DIAGNOSIS — N390 Urinary tract infection, site not specified: Secondary | ICD-10-CM | POA: Diagnosis not present

## 2022-11-12 DIAGNOSIS — I10 Essential (primary) hypertension: Secondary | ICD-10-CM | POA: Diagnosis not present

## 2022-11-12 DIAGNOSIS — E785 Hyperlipidemia, unspecified: Secondary | ICD-10-CM | POA: Diagnosis not present

## 2022-11-12 DIAGNOSIS — N3000 Acute cystitis without hematuria: Secondary | ICD-10-CM | POA: Diagnosis not present

## 2022-11-13 ENCOUNTER — Other Ambulatory Visit: Payer: Self-pay | Admitting: Cardiology

## 2022-11-20 ENCOUNTER — Encounter: Payer: Self-pay | Admitting: Cardiology

## 2022-11-20 ENCOUNTER — Ambulatory Visit: Payer: Medicare Other | Attending: Cardiology | Admitting: Cardiology

## 2022-11-20 VITALS — BP 124/70 | HR 65 | Ht 60.0 in | Wt 148.0 lb

## 2022-11-20 DIAGNOSIS — N184 Chronic kidney disease, stage 4 (severe): Secondary | ICD-10-CM | POA: Diagnosis not present

## 2022-11-20 DIAGNOSIS — I10 Essential (primary) hypertension: Secondary | ICD-10-CM | POA: Diagnosis not present

## 2022-11-20 DIAGNOSIS — R42 Dizziness and giddiness: Secondary | ICD-10-CM

## 2022-11-20 NOTE — Progress Notes (Signed)
Cardiology Office Note:    Date:  11/20/2022   ID:  Carla Bennett, DOB [birth year], MRN [id number]  PCP:  Fanny Bien, MD  [facility] Cardiologist:  Candee Furbish, MD  [facility] Electrophysiologist:  None   Referring MD: Donald Prose, MD    History of Present Illness:    Carla Bennett is a 88 y.o. female here for the follow-up of chronic systolic heart failure, hypertension.  Prior chemotherapy for breast cancer, pericardial effusion.    On her echocardiogram originally, her ejection fraction was markedly reduced at 20-25%. She had been treated with Adriamycin. This also showed moderate pericardial effusion.  In review of prior notes: She had an echo performed on 03/13/15 which at that time showed no evidence of pericardial effusion. Mild mitral and aortic regurgitation noted. Normal ejection fraction. This was pre-chemotherapy to breast cancer.  Aortic atherosclerosis and mild coronary artery atherosclerosis was also noted.    She still has GI issues for several years, ended up having a diet where she eliminated many fruits.  Dr. Henrene Pastor.  Spironolactone was previously stopped due to low blood pressures and lethargic feelings, but she is now back on.  Getting tired dizzy lightheaded. Likes BP 140.  She showed me BP log with several values.  Some of them are low.  At 1 point she was 75 systolic.  She has a Materials engineer that she wears that has 68 different birth stones of her grandchildren.  She denies any palpitations, chest pain, or peripheral edema. No headaches, orthopnea, or PND.   Specialty Problems       Cardiology Problems   Chronic systolic CHF (congestive heart failure) ([place])    Echo 02/21/16:  Mild LVH, EF 20-25%  Echo 6/17:  Mild LVH EF 30-35% Echo 01/2017: E 45-50%      Mixed hyperlipidemia   Essential hypertension   Dilated cardiomyopathy ([place])     Past Medical History:  Diagnosis Date   Allergy    Aortic regurgitation    Arthritis     hands, neck   Basal cell carcinoma    Benign neoplasm of colon    Breast cancer of upper-outer quadrant of left female breast ([place]) 02/23/2015   4'16-left breast cancer-surgery, chemo, radiation-[name] follows every 3 months   Cataract    beginning stages-monitoring by MD   Cholelithiasis    resolved with surgery   Chronic systolic CHF (congestive heart failure) ([place]) 02/26/2016   A.  Echo 02/21/16:  Mild LVH, EF 20-25%, diff HK, Gr 3 DD, mod AI, mod to severe MR, mod LAE, mod redcued RVSF, mild RAE, mod TR, PASP 53 mmHg, small pericardial effusion, mod L pleural effusion  //  B. Echo 6/17:  Mild LVH EF 30-35%, diffuse HK, grade 1 diastolic dysfunction, trivial AI, MAC, mild LAE, normal RVSF, trivial pericardial effusion     Cystocele    Diverticulitis    Diverticulosis of colon (without mention of hemorrhage)    Gout    resolved -   Hearing loss    some hearing loss in right ear- no hearing aids   Heart murmur    Hematuria    negative urology work up   History of blood transfusion 1971   x 1 with D&C -    Hx of abnormal cervical Pap smear    LEEP-CIN I, negative margins and ECC   Hypercalcemia    Hypertension    Mitral regurgitation    Neuromuscular disorder (HCC)    mild  neuropathy in feet due to chemo.   OA (osteoarthritis) of hip    right   Other issue of medical certificates    uterine myomata   Parathyroid abnormality ([place])    being followed Dr. Barbette Hair   Radiation 10/01/15-11/16/15   left breast axillary and supraclavicular reigion 45 gray, left breast 50.4 gray, lumpectomy cavity boost 10 gray   Renal disease 3/16   Stage 3 kidney disease-seeing Dr Buddy Duty and Dr Marval Regal   Uterine prolaps    resolved after surgery   Vitamin D deficiency     Past Surgical History:  Procedure Laterality Date   BREAST LUMPECTOMY Left 08/2015   CHOLECYSTECTOMY     COLONOSCOPY  01/18/2013   [name]   COLONOSCOPY N/A 06/26/2016   Procedure: COLONOSCOPY;  Surgeon: Irene Shipper, MD;   Location: [facility];  Service: Endoscopy;  Laterality: N/A;   CYSTOCELE REPAIR  08/2010   Rectocele, vault prolapse   DILATION AND CURETTAGE OF UTERUS     x2   PORT-A-CATH REMOVAL Right 08/23/2015   Procedure: REMOVAL PORT-A-CATH;  Surgeon: Erroll Luna, MD;  Location: [facility];  Service: General;  Laterality: Right;   PORTACATH PLACEMENT Right 03/20/2015   Procedure: INSERTION PORT-A-CATH;  Surgeon: Erroll Luna, MD;  Location: [place];  Service: General;  Laterality: Right;   RADIOACTIVE SEED GUIDED PARTIAL MASTECTOMY WITH AXILLARY SENTINEL LYMPH NODE BIOPSY Left 08/23/2015   Procedure: LEFT BREAST PARTIAL MASTECTOMY WITH SENTINEL LYMPH NODE [place];  Surgeon: Erroll Luna, MD;  Location: [place];  Service: General;  Laterality: Left;   TONSILLECTOMY AND ADENOIDECTOMY     TOTAL VAGINAL HYSTERECTOMY     secondary to prolapse, ovaries not removed    Current Medications: Current Meds  Medication Sig   aspirin EC 81 MG tablet Take 1 tablet (81 mg total) by mouth daily. Swallow whole.   B Complex Vitamins (VITAMIN B COMPLEX PO) Take 1 tablet by mouth daily.    calcium carbonate (TUMS) 500 MG chewable tablet Chew 2 tablets (400 mg of elemental calcium total) by mouth 3 times/day as needed-between meals & bedtime for indigestion or heartburn.   carvedilol (COREG) 25 MG tablet Take 25 mg by mouth 2 (two) times daily with a meal.   furosemide (LASIX) 20 MG tablet Take 20 mg by mouth daily as needed for fluid.   hyoscyamine (LEVSIN) 0.125 MG tablet TAKE 1-2 TABLET EVERY 4-6 HOURS AS NEEDED FOR ABDOMINAL PAIN   loratadine (CLARITIN) 10 MG tablet Take 10 mg by mouth daily.   spironolactone (ALDACTONE) 25 MG tablet TAKE 1/2 TABLET BY MOUTH EVERY DAY   [DISCONTINUED] lisinopril (ZESTRIL) 40 MG tablet Take 40 mg by mouth at bedtime.     Allergies:   Statins   Social History   Socioeconomic History   Marital status: Widowed    Spouse name: Not on file    Number of children: 4   Years of education: Not on file   Highest education level: Not on file  Occupational History   Occupation: retired  Tobacco Use   Smoking status: Never   Smokeless tobacco: Never  Vaping Use   Vaping Use: Never used  Substance and Sexual Activity   Alcohol use: Yes    Comment: occasionally  wine/beer   Drug use: No   Sexual activity: Never    Birth control/protection: Surgical    Comment: Vaginal hysterectomy  Other Topics Concern   Not on file  Social History Narrative  Not on file   Social Determinants of Health   Financial Resource Strain: Not on file  Food Insecurity: Not on file  Transportation Needs: Not on file  Physical Activity: Not on file  Stress: Not on file  Social Connections: Not on file     Family History: The patient's family history includes Breast cancer (age of onset: 45) in her sister; Cancer (age of onset: 69) in her cousin and maternal uncle; Dementia in her mother; Lung cancer (age of onset: 75) in her father; Multiple sclerosis in her sister; Parkinson's disease in her mother; Prostate cancer (age of onset: 55) in her maternal uncle; Stomach cancer in her maternal uncle. There is no history of Colon cancer, Esophageal cancer, Rectal cancer, or Colon polyps.  ROS:   Please see the history of present illness. (+) Near-syncope (+) Myalgias (+) Shortness of breath (+) Joint pain (+) Fatigue, Malaise All other systems are reviewed and negative.   EKGs/Labs/Other Studies Reviewed:    CT Chest 12/18/2020: FINDINGS: Cardiovascular: No significant vascular findings. Normal heart size. No pericardial effusion.   Mediastinum/Nodes: No axillary supraclavicular adenopathy. No mediastinal adenopathy. Trachea and esophagus are normal. Small hiatal hernia.   Lungs/Pleura: No pneumothorax or pulmonary contusion. No pleural fluid. No aspiration. Mild RIGHT lower lobe bronchial thickening.   Upper Abdomen: Limited view of the  liver, kidneys, pancreas are unremarkable. Normal adrenal glands.   Musculoskeletal: No rib fracture.  No spine fracture   IMPRESSION: 1. No evidence of thoracic trauma. 2. Hiatal hernia. 3. Mild bronchitis in the RIGHT lower lobe  ECHO 08/04/22:   1. Left ventricular ejection fraction, by estimation, is 60 to 65%. The  left ventricle has normal function. The left ventricle has no regional  wall motion abnormalities. There is mild left ventricular hypertrophy.  Left ventricular diastolic parameters  were normal. The average left ventricular global longitudinal strain is  -17.5 %. The global longitudinal strain is normal.   2. Right ventricular systolic function is normal. The right ventricular  size is normal. There is normal pulmonary artery systolic pressure.   3. Left atrial size was mildly dilated.   4. The mitral valve is abnormal. Mild mitral valve regurgitation. No  evidence of mitral stenosis.   5. The aortic valve is tricuspid. There is mild calcification of the  aortic valve. There is mild thickening of the aortic valve. Aortic valve  regurgitation is mild. Aortic valve sclerosis is present, with no evidence  of aortic valve stenosis.   6. The inferior vena cava is normal in size with greater than 50%  respiratory variability, suggesting right atrial pressure of 3 mmHg.    Echo TTE 01/21/2017: - Left ventricle: The cavity size was normal. Wall thickness was    normal. Systolic function was mildly reduced. The estimated    ejection fraction was in the range of 45% to 50%. Inferolateral    hypokinesis. Abnormal GLPSS at -14% wtih inferior strain    abnormality. Doppler parameters are consistent with abnormal left    ventricular relaxation (grade 1 diastolic dysfunction). The E/e&'    ratio is >15, suggesting elevated LV filling pressure.  - Aortic valve: Trileaflet. Sclerosis without stenosis. There was    trivial regurgitation.  - Left atrium: The atrium was normal in  size.  - Inferior vena cava: The vessel was normal in size. The    respirophasic diameter changes were in the normal range (>= 50%),    consistent with normal central venous pressure.  Impressions:  - Compared to a prior study in 2017, the LVEF has improved to    45-50% - there is inferior and inferolateral hypokinesis.   EKG:  EKG is personally reviewed and interpreted. 02/18/2022: EKG was not ordered. 08/21/2021: EKG was not ordered. 07/23/2021: EKG was not ordered. 07/06/2021 (ED): Sinus rhythm at 66 bpm. LAD. Old anterior infarct. No significant change since last tracing.  Recent Labs: 08/29/2022: ALT 14; TSH 2.584 09/10/2022: BUN 17; Creatinine 1.1; Hemoglobin 14.7; Platelets 413; Potassium 4.8; Sodium 136   Recent Lipid Panel    Component Value Date/Time   CHOL 113 08/29/2022 0558   CHOL 140 11/29/2020 1004   TRIG 74 08/29/2022 0558   HDL 44 08/29/2022 0558   HDL 54 11/29/2020 1004   CHOLHDL 2.6 08/29/2022 0558   VLDL 15 08/29/2022 0558   LDLCALC 54 08/29/2022 0558   LDLCALC 64 11/29/2020 1004    Physical Exam:     VS:  BP 124/70   Pulse 65   Ht 5' (1.524 m)   Wt 148 lb (67.1 kg)   LMP 10/20/1996   SpO2 97%   BMI 28.90 kg/m     Wt Readings from Last 3 Encounters:  11/20/22 148 lb (67.1 kg)  08/28/22 150 lb (68 kg)  08/19/22 153 lb (69.4 kg)     GEN: Well nourished, well developed in no acute distress HEENT: Normal NECK: No JVD; No carotid bruits LYMPHATICS: No lymphadenopathy CARDIAC: RRR, no murmurs, rubs, gallops RESPIRATORY:  Clear to auscultation without rales, wheezing or rhonchi  ABDOMEN: Soft, non-tender, non-distended MUSCULOSKELETAL:  No edema; No deformity  SKIN: Warm and dry NEUROLOGIC:  Alert and oriented x 3 PSYCHIATRIC:  Normal affect    ASSESSMENT:    1. Dizziness   2. CKD (chronic kidney disease) stage 4, GFR 15-29 ml/min (HCC)   3. Essential hypertension       PLAN:    In order of problems listed above:   Chronic  systolic CHF (congestive heart failure) ([place]) Previously had been on Adriamycin during breast cancer therapy.  Most recent ejection fraction 60-65 % personally reviewed improved from original of 20.   Since she is feeling washed out with occasional low blood pressure, I am willing to stop her lisinopril 40 mg a day.  If Dr. Marval Regal feels like we should be on some degree of ACE inhibitor for renal protection I am comfortable with a low-dose.   Continue with carvedilol 25 twice a day, Lasix 20 mg a day as needed ankle edema, spironolactone 12.5 mg a day.  She does get blood work done fairly often by nephrology, Dr. Marval Regal.   Essential hypertension Has been lower recently.  She does not like the way this makes her feel, we are stopping her lisinopril 40 mg.   She does sometimes have midday blood pressures that are low.  She feels washed out and achy she states.   CKD (chronic kidney disease) Followed by nephrology.  Excellent.   Follow-up: 6 months.  Medication Adjustments/Labs and Tests Ordered: Current medicines are reviewed at length with the patient today.  Concerns regarding medicines are outlined above.   No orders of the defined types were placed in this encounter.  No orders of the defined types were placed in this encounter.  Patient Instructions  Medication Instructions:  Please discontinue your Lisinopril. Continue all other medications as listed.  *If you need a refill on your cardiac medications before your next appointment, please call your pharmacy*  Follow-Up:  At [facility], you and your health needs are our priority.  As part of our continuing mission to provide you with exceptional heart care, we have created designated Provider Care Teams.  These Care Teams include your primary Cardiologist (physician) and Advanced Practice Providers (APPs -  Physician Assistants and Nurse Practitioners) who all work together to provide you with the care you need, when  you need it.  We recommend signing up for the patient portal called "MyChart".  Sign up information is provided on this After Visit Summary.  MyChart is used to connect with patients for Virtual Visits (Telemedicine).  Patients are able to view lab/test results, encounter notes, upcoming appointments, etc.  Non-urgent messages can be sent to your provider as well.   To learn more about what you can do with MyChart, go to [email or web address].    Your next appointment:   6 month(s)  Provider:   Candee Furbish, MD        Signed, Candee Furbish, MD  11/20/2022 3:59 PM    [place]

## 2022-11-20 NOTE — Patient Instructions (Signed)
Medication Instructions:  Please discontinue your Lisinopril. Continue all other medications as listed.  *If you need a refill on your cardiac medications before your next appointment, please call your pharmacy*  Follow-Up: At [facility], you and your health needs are our priority.  As part of our continuing mission to provide you with exceptional heart care, we have created designated Provider Care Teams.  These Care Teams include your primary Cardiologist (physician) and Advanced Practice Providers (APPs -  Physician Assistants and Nurse Practitioners) who all work together to provide you with the care you need, when you need it.  We recommend signing up for the patient portal called "MyChart".  Sign up information is provided on this After Visit Summary.  MyChart is used to connect with patients for Virtual Visits (Telemedicine).  Patients are able to view lab/test results, encounter notes, upcoming appointments, etc.  Non-urgent messages can be sent to your provider as well.   To learn more about what you can do with MyChart, go to [email or web address].    Your next appointment:   6 month(s)  Provider:   Candee Furbish, MD

## 2022-11-27 ENCOUNTER — Telehealth: Payer: Self-pay | Admitting: Internal Medicine

## 2022-11-27 NOTE — Telephone Encounter (Signed)
Discussed with pt that she can take metamucil if she cannot find the citrucel.

## 2022-11-27 NOTE — Telephone Encounter (Signed)
Inbound call from pt, requesting to speak with a nurse regarding a medication"Citrucel" pt states they not longer making it and want to know what can she takes instead.. Please advise

## 2022-12-01 DIAGNOSIS — R42 Dizziness and giddiness: Secondary | ICD-10-CM | POA: Diagnosis not present

## 2022-12-01 DIAGNOSIS — R5383 Other fatigue: Secondary | ICD-10-CM | POA: Diagnosis not present

## 2022-12-01 DIAGNOSIS — I10 Essential (primary) hypertension: Secondary | ICD-10-CM | POA: Diagnosis not present

## 2022-12-01 DIAGNOSIS — Z0000 Encounter for general adult medical examination without abnormal findings: Secondary | ICD-10-CM | POA: Diagnosis not present

## 2022-12-01 DIAGNOSIS — T7840XA Allergy, unspecified, initial encounter: Secondary | ICD-10-CM | POA: Diagnosis not present

## 2022-12-01 DIAGNOSIS — Z1339 Encounter for screening examination for other mental health and behavioral disorders: Secondary | ICD-10-CM | POA: Diagnosis not present

## 2022-12-01 DIAGNOSIS — Z1331 Encounter for screening for depression: Secondary | ICD-10-CM | POA: Diagnosis not present

## 2022-12-03 ENCOUNTER — Telehealth: Payer: Self-pay | Admitting: Hematology and Oncology

## 2022-12-03 DIAGNOSIS — I129 Hypertensive chronic kidney disease with stage 1 through stage 4 chronic kidney disease, or unspecified chronic kidney disease: Secondary | ICD-10-CM | POA: Diagnosis not present

## 2022-12-03 DIAGNOSIS — N2581 Secondary hyperparathyroidism of renal origin: Secondary | ICD-10-CM | POA: Diagnosis not present

## 2022-12-03 DIAGNOSIS — I5020 Unspecified systolic (congestive) heart failure: Secondary | ICD-10-CM | POA: Diagnosis not present

## 2022-12-03 DIAGNOSIS — N1831 Chronic kidney disease, stage 3a: Secondary | ICD-10-CM | POA: Diagnosis not present

## 2022-12-03 NOTE — Telephone Encounter (Signed)
Rescheduled appointment per provider PAL. Patient is aware of the changes made to her upcoming appointment. 

## 2022-12-09 LAB — LAB REPORT - SCANNED
Albumin, Urine POC: <3
Albumin/Creatinine Ratio, Urine, POC: <8
Creatinine, POC: 39.1 mg/dL
EGFR: 43

## 2022-12-11 ENCOUNTER — Ambulatory Visit: Payer: Medicare Other | Admitting: Hematology and Oncology

## 2022-12-11 NOTE — Progress Notes (Signed)
Patient Care Team: Fanny Bien, MD as PCP - General (Family Medicine) Jerline Pain, MD as PCP - Cardiology (Cardiology) Erroll Luna, MD as Consulting Physician (General Surgery) Nicholas Lose, MD as Consulting Physician (Hematology and Oncology) Gery Pray, MD as Consulting Physician (Radiation Oncology) Mauro Kaufmann, RN as Registered Nurse Rockwell Germany, RN as Registered Nurse Jake Shark Johny Blamer, NP as Nurse Practitioner (Hematology and Oncology)  DIAGNOSIS: No diagnosis found.  SUMMARY OF ONCOLOGIC HISTORY: Oncology History  Breast cancer of upper-outer quadrant of left female breast ([place])  02/15/2015 Mammogram   Left breast increased density, mass is irregular with microcalcifications measuring 2.9 cm, cyst in the breast as well as abnormal enlarged lymph nodes   02/20/2015 Initial Diagnosis   Left breast biopsy: Invasive ductal carcinoma with DCIS, axillary lymph node biopsy positive, ER 0%, PR 0%, HER-2 negative ratio 1.13 with Ki-67 75%   03/08/2015 Procedure   OvaNext panel [name]) reveals no clinically significant variant at ATM, BARD1, BRCA1, BRCA2, BRIP1, CDH1, CHEK2, EPCAM, MLH1, MRE11A, MSH2, MSH6, MUTYH, NBN, NF1, PALB2, PMS2, PTEN, RAD50, RAD51C, RAD51D, SMARCA4, STK11, and TP53.    03/15/2015 PET scan   2.8 cm solid irregular left breast mass which is hypermetabolic and consistent with known left breast cancer. 2. Weakly positive axillary lymph nodes or equivocal   03/20/2015 Breast MRI   3.1 cm irregular enhancing mass located within the left breast at the 1 o'clock position; 1.6 cm Left axillary LN   03/20/2015 Clinical Stage   Stage IIB: T2 N1   03/22/2015 - 08/02/2015 Neo-Adjuvant Chemotherapy   Neo-adjuvant chemotherapy with dose dense Adriamycin and Cytoxan 4 followed by Abraxane weekly 12   08/06/2015 Breast MRI   Left breast now measures 0.9 x 0.8 x 1.9 cm (previously measured 3.0 x 3.1 x 1.8 cm). Dec size of abnormal Left axill  LN   08/23/2015 Surgery   Left lumpectomy: Invasive ductal carcinoma grade 3, 2.8 cm, associated extensive DCIS with comedonecrosis, margins negative,1/2 sentinel nodes positive, ER 0%, PR 0%, HER2/neu repeated and remains negative (ratio 1.13)   08/23/2015 Pathologic Stage   Stage IIB: ypT2 ypN1   10/01/2015 - 11/16/2015 Radiation Therapy   Adjuvant RT: Left breast axillary and supraclavicular region, 45 gray in 25 fractions, the left breast received 3 additional treatments for a cumulative dose of 50.4 gray. Lumpectomy cavity boost 10 gray in 5 fractions   12/27/2015 Survivorship   Survivorship visit completed and copy of care plan provided to patient     CHIEF COMPLIANT: Follow-up of triple negative breast cancer   INTERVAL HISTORY: Carla Bennett is a 88 y.o. with above-mentioned history of triple negative left breast cancer treated with neoadjuvant chemotherapy, lumpectomy, adjuvant radiation, and who is currently on surveillance. She presents to the clinic for a follow-up.    ALLERGIES:  is allergic to statins.  MEDICATIONS:  Current Outpatient Medications  Medication Sig Dispense Refill   aspirin EC 81 MG tablet Take 1 tablet (81 mg total) by mouth daily. Swallow whole.     B Complex Vitamins (VITAMIN B COMPLEX PO) Take 1 tablet by mouth daily.      calcium carbonate (TUMS) 500 MG chewable tablet Chew 2 tablets (400 mg of elemental calcium total) by mouth 3 times/day as needed-between meals & bedtime for indigestion or heartburn.     carvedilol (COREG) 25 MG tablet Take 25 mg by mouth 2 (two) times daily with a meal.     furosemide (LASIX) 20 MG  tablet Take 20 mg by mouth daily as needed for fluid.     hyoscyamine (LEVSIN) 0.125 MG tablet TAKE 1-2 TABLET EVERY 4-6 HOURS AS NEEDED FOR ABDOMINAL PAIN 90 tablet 3   loratadine (CLARITIN) 10 MG tablet Take 10 mg by mouth daily.     spironolactone (ALDACTONE) 25 MG tablet TAKE 1/2 TABLET BY MOUTH EVERY DAY 45 tablet 3   No current  facility-administered medications for this visit.    PHYSICAL EXAMINATION: ECOG PERFORMANCE STATUS: {CHL ONC ECOG PS:[phone]}  There were no vitals filed for this visit. There were no vitals filed for this visit.  BREAST:*** No palpable masses or nodules in either right or left breasts. No palpable axillary supraclavicular or infraclavicular adenopathy no breast tenderness or nipple discharge. (exam performed in the presence of a chaperone)  LABORATORY DATA:  I have reviewed the data as listed    Latest Ref Rng & Units 09/10/2022   12:00 AM 08/29/2022   12:00 AM 07/06/2021    1:28 PM  CMP  Glucose 70 - 99 mg/dL  112  119   BUN 4 - '21 17     14  21   '$ Creatinine 0.5 - 1.1 1.1     1.08  1.19   Sodium 137 - 147 136     139  133   Potassium 3.5 - 5.1 mEq/L 4.8     4.1  3.6   Chloride 99 - 108 101     106  101   CO2 13 - '22 25     22  23   '$ Calcium 8.7 - 10.7 11.2     10.5  9.7   Total Protein 6.5 - 8.1 g/dL  6.9  6.5   Total Bilirubin 0.3 - 1.2 mg/dL  0.5  0.6   Alkaline Phos 38 - 126 U/L  62  57   AST 15 - 41 U/L  19  24   ALT 0 - 44 U/L  14  16      This result is from an external source.    Lab Results  Component Value Date   WBC 12.9 09/10/2022   HGB 14.7 09/10/2022   HCT 44 09/10/2022   MCV 90.7 08/29/2022   PLT 413 (A) 09/10/2022   NEUTROABS 6.7 08/29/2022    ASSESSMENT & PLAN:  No problem-specific Assessment & Plan notes found for this encounter.    No orders of the defined types were placed in this encounter.  The patient has a good understanding of the overall plan. she agrees with it. she will call with any problems that may develop before the next visit here. Total time spent: 30 mins including face to face time and time spent for planning, charting and co-ordination of care   Suzzette Righter, [place] 12/11/22    I Gardiner Coins am acting as a Education administrator for [employer]  ***

## 2022-12-12 DIAGNOSIS — R42 Dizziness and giddiness: Secondary | ICD-10-CM | POA: Diagnosis not present

## 2022-12-12 DIAGNOSIS — I10 Essential (primary) hypertension: Secondary | ICD-10-CM | POA: Diagnosis not present

## 2022-12-12 DIAGNOSIS — R5383 Other fatigue: Secondary | ICD-10-CM | POA: Diagnosis not present

## 2022-12-16 ENCOUNTER — Other Ambulatory Visit: Payer: Self-pay

## 2022-12-16 ENCOUNTER — Inpatient Hospital Stay: Payer: Medicare Other | Attending: Hematology and Oncology | Admitting: Hematology and Oncology

## 2022-12-16 VITALS — BP 172/80 | HR 87 | Temp 98.1°F | Resp 20 | Wt 149.9 lb

## 2022-12-16 DIAGNOSIS — Z9221 Personal history of antineoplastic chemotherapy: Secondary | ICD-10-CM | POA: Insufficient documentation

## 2022-12-16 DIAGNOSIS — Z171 Estrogen receptor negative status [ER-]: Secondary | ICD-10-CM | POA: Diagnosis not present

## 2022-12-16 DIAGNOSIS — C50412 Malignant neoplasm of upper-outer quadrant of left female breast: Secondary | ICD-10-CM | POA: Diagnosis not present

## 2022-12-16 DIAGNOSIS — Z853 Personal history of malignant neoplasm of breast: Secondary | ICD-10-CM | POA: Insufficient documentation

## 2022-12-16 DIAGNOSIS — Z923 Personal history of irradiation: Secondary | ICD-10-CM | POA: Insufficient documentation

## 2022-12-16 NOTE — Assessment & Plan Note (Addendum)
02/15/2015: Left breast invasive ductal carcinoma grade 3 ER 0% PR 0% HER-2 negative Ki-67 75%, 2.9 cm mass with microcalcifications plus abnormal lymph nodes biopsy-proven to be breast cancer T2 N1 M0 stage IIB clinical stage 04/15/2015 -08/02/2015 S/P Neoadjuvant Dose dense AC folll by Abraxane X 12  left lumpectomy 08/23/2015 : Invasive ductal carcinoma grade 3, 2.8 cm, associated extensive DCIS with comedonecrosis, margins negative,1/2 sentinel nodes positive, ER 0%, PR 0% T2 N1 M0 stage IIB Completed radiation therapy 11/16/2015    I offered the patient adjuvant Xeloda  But she refused.   Hypercalcemia: Ultrasound of the neck had revealed a parathyroid adenoma.  She now follows with nephrology and has been doing quite well with calcium levels in the normal range.   Breast Cancer Surveillance: 1. Breast exam 12/16/2022: Benign 2. Mammogram 05/19/2022 at [facility]: Benign, density category B 3.  Bone density 05/19/2022 at [facility]: T score -1.2: Very mild osteopenia    Return to clinic in 1 yr

## 2022-12-17 DIAGNOSIS — Z789 Other specified health status: Secondary | ICD-10-CM | POA: Diagnosis not present

## 2022-12-17 DIAGNOSIS — T7840XA Allergy, unspecified, initial encounter: Secondary | ICD-10-CM | POA: Diagnosis not present

## 2022-12-17 DIAGNOSIS — E782 Mixed hyperlipidemia: Secondary | ICD-10-CM | POA: Diagnosis not present

## 2022-12-17 DIAGNOSIS — Z23 Encounter for immunization: Secondary | ICD-10-CM | POA: Diagnosis not present

## 2022-12-17 DIAGNOSIS — G720 Drug-induced myopathy: Secondary | ICD-10-CM | POA: Diagnosis not present

## 2022-12-17 DIAGNOSIS — I10 Essential (primary) hypertension: Secondary | ICD-10-CM | POA: Diagnosis not present

## 2022-12-31 ENCOUNTER — Other Ambulatory Visit: Payer: Self-pay | Admitting: Internal Medicine

## 2022-12-31 DIAGNOSIS — L814 Other melanin hyperpigmentation: Secondary | ICD-10-CM | POA: Diagnosis not present

## 2022-12-31 DIAGNOSIS — Z85828 Personal history of other malignant neoplasm of skin: Secondary | ICD-10-CM | POA: Diagnosis not present

## 2022-12-31 DIAGNOSIS — L304 Erythema intertrigo: Secondary | ICD-10-CM | POA: Diagnosis not present

## 2022-12-31 DIAGNOSIS — L821 Other seborrheic keratosis: Secondary | ICD-10-CM | POA: Diagnosis not present

## 2023-01-22 DIAGNOSIS — J309 Allergic rhinitis, unspecified: Secondary | ICD-10-CM | POA: Diagnosis not present

## 2023-01-22 DIAGNOSIS — I10 Essential (primary) hypertension: Secondary | ICD-10-CM | POA: Diagnosis not present

## 2023-01-28 DIAGNOSIS — H25813 Combined forms of age-related cataract, bilateral: Secondary | ICD-10-CM | POA: Diagnosis not present

## 2023-01-28 DIAGNOSIS — H5203 Hypermetropia, bilateral: Secondary | ICD-10-CM | POA: Diagnosis not present

## 2023-01-28 DIAGNOSIS — H52203 Unspecified astigmatism, bilateral: Secondary | ICD-10-CM | POA: Diagnosis not present

## 2023-01-28 DIAGNOSIS — H524 Presbyopia: Secondary | ICD-10-CM | POA: Diagnosis not present

## 2023-02-02 ENCOUNTER — Other Ambulatory Visit: Payer: Self-pay

## 2023-02-02 NOTE — Telephone Encounter (Signed)
Pt calling requesting a refill on rosuvastatin. This medication was D/C, but reordered by pt cancer doctor. Pt stated that she started taking rosuvastatin twice a week and she is doing fine. Pt would like Dr. Minerva Fester nurse to give her a call back concerning this matter. Please address

## 2023-02-26 MED ORDER — ROSUVASTATIN CALCIUM 10 MG PO TABS: 10.0000 mg | ORAL_TABLET | ORAL | 3 refills | Status: DC

## 2023-02-26 NOTE — Telephone Encounter (Signed)
Pt has been taking Crestor 10 mg twice a week for some time not and doing OK with it.   She is requesting a refill.  Refill sent electronically to [facility] as requested.

## 2023-03-03 ENCOUNTER — Ambulatory Visit (INDEPENDENT_AMBULATORY_CARE_PROVIDER_SITE_OTHER): Payer: Medicare Other | Admitting: Neurology

## 2023-03-03 ENCOUNTER — Encounter: Payer: Self-pay | Admitting: Neurology

## 2023-03-03 VITALS — BP 146/71 | HR 72 | Ht 60.0 in | Wt 150.0 lb

## 2023-03-03 DIAGNOSIS — I5022 Chronic systolic (congestive) heart failure: Secondary | ICD-10-CM

## 2023-03-03 DIAGNOSIS — I420 Dilated cardiomyopathy: Secondary | ICD-10-CM

## 2023-03-03 DIAGNOSIS — R0683 Snoring: Secondary | ICD-10-CM | POA: Diagnosis not present

## 2023-03-03 DIAGNOSIS — R351 Nocturia: Secondary | ICD-10-CM

## 2023-03-03 DIAGNOSIS — E663 Overweight: Secondary | ICD-10-CM

## 2023-03-03 DIAGNOSIS — Z9189 Other specified personal risk factors, not elsewhere classified: Secondary | ICD-10-CM | POA: Diagnosis not present

## 2023-03-03 NOTE — Progress Notes (Signed)
Subjective:    Patient ID: Carla Bennett is a 88 y.o. female.  HPI    Huston Foley, MD, PhD [facility] [address], [address] [address] [place], [place] [address]  Dear Clayborn Heron,  I saw your patient, Carla Bennett, upon your kind request in my sleep clinic today for initial consultation of her sleep disorder, in particular, concern for underlying obstructive sleep apnea.  The patient is unaccompanied today. As you know, Carla Bennett is an 88 year old female with an underlying complex medical history of aortic regurgitation, breast cancer with status post left lumpectomy in 2016, chemotherapy and radioactive seed implant with radiation treatment, chronic systolic congestive heart failure, hypertension, mitral regurgitation, arthritis, cholelithiasis, diverticulitis, gout, vitamin D deficiency, and overweight state, who reports snoring and difficulty maintaining sleep with nocturia disrupting her sleep primarily.  Her Epworth sleepiness score is 2 out of 24, fatigue severity score is 21 out of 63.  I reviewed your office note from 01/22/2023.  She is widowed and lives alone, her husband of 56 years died about 9-1/2 years ago.  She has 4 grown children and 11 grandchildren.  She is retired from [employer].  She is a non-smoker and drinks alcohol rarely, caffeine in the form of coffee, 1 or 2 cups/day.  She has nocturia about 2-3 times per night, denies recurrent nocturnal or morning headaches, no family history of sleep apnea, had a tonsillectomy as a child.  Bedtime is around midnight and rise time between 8 and 8:30 AM.  She reports that she had chemotherapy in 2016 and developed congestive heart failure after chemo.  She would be willing to consider a sleep study.  She is scheduled to see her cardiologist in follow-up soon.  Her Past Medical History Is Significant For: Past Medical History:  Diagnosis Date   Allergy    Aortic regurgitation    Arthritis    hands,  neck   Basal cell carcinoma    Benign neoplasm of colon    Breast cancer of upper-outer quadrant of left female breast (HCC) 02/23/2015   4'16-left breast cancer-surgery, chemo, radiation-[name] follows every 3 months   Cataract    beginning stages-monitoring by MD   Cholelithiasis    resolved with surgery   Chronic systolic CHF (congestive heart failure) (HCC) 02/26/2016   A.  Echo 02/21/16:  Mild LVH, EF 20-25%, diff HK, Gr 3 DD, mod AI, mod to severe MR, mod LAE, mod redcued RVSF, mild RAE, mod TR, PASP 53 mmHg, small pericardial effusion, mod L pleural effusion  //  B. Echo 6/17:  Mild LVH EF 30-35%, diffuse HK, grade 1 diastolic dysfunction, trivial AI, MAC, mild LAE, normal RVSF, trivial pericardial effusion     Cystocele    Diverticulitis    Diverticulosis of colon (without mention of hemorrhage)    Gout    resolved -   Hearing loss    some hearing loss in right ear- no hearing aids   Heart murmur    Hematuria    negative urology work up   History of blood transfusion 1971   x 1 with D&C -    Hx of abnormal cervical Pap smear    LEEP-CIN I, negative margins and ECC   Hypercalcemia    Hypertension    Mitral regurgitation    Neuromuscular disorder (HCC)    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   OA (osteoarthritis) of hip    right   Other issue of medical certificates  uterine myomata   Parathyroid abnormality (HCC)    being followed Dr. Ester Rink   Radiation 10/01/15-11/16/15   left breast axillary and supraclavicular reigion 45 gray, left breast 50.4 gray, lumpectomy cavity boost 10 gray   Renal disease 3/16   Stage 3 kidney disease-seeing Dr Sharl Ma and Dr Arrie Aran   Uterine prolaps    resolved after surgery   Vitamin D deficiency     Her Past Surgical History Is Significant For: Past Surgical History:  Procedure Laterality Date   BREAST LUMPECTOMY Left 08/2015   CHOLECYSTECTOMY     COLONOSCOPY  01/18/2013   [name]   COLONOSCOPY N/A 06/26/2016   Procedure:  COLONOSCOPY;  Surgeon: Hilarie Fredrickson, MD;  Location: [facility];  Service: Endoscopy;  Laterality: N/A;   CYSTOCELE REPAIR  08/2010   Rectocele, vault prolapse   DILATION AND CURETTAGE OF UTERUS     x2   PORT-A-CATH REMOVAL Right 08/23/2015   Procedure: REMOVAL PORT-A-CATH;  Surgeon: Harriette Bouillon, MD;  Location: [facility];  Service: General;  Laterality: Right;   PORTACATH PLACEMENT Right 03/20/2015   Procedure: INSERTION PORT-A-CATH;  Surgeon: Harriette Bouillon, MD;  Location[facility];  Service: General;  Laterality: Right;   RADIOACTIVE SEED GUIDED PARTIAL MASTECTOMY WITH AXILLARY SENTINEL LYMPH NODE BIOPSY Left 08/23/2015   Procedure: LEFT BREAST PARTIAL MASTECTOMY WITH SENTINEL LYMPH NODE MAPPING;  Surgeon: Harriette Bouillon, MD;  Location: [facility];  Service: General;  Laterality: Left;   TONSILLECTOMY AND ADENOIDECTOMY     TOTAL VAGINAL HYSTERECTOMY     secondary to prolapse, ovaries not removed    Her Family History Is Significant For: Family History  Problem Relation Age of Onset   Parkinson's disease Mother    Dementia Mother    Lung cancer Father 41   Breast cancer Sister 65   Multiple sclerosis Sister    Prostate cancer Maternal Uncle 63   Stomach cancer Maternal Uncle    Cancer Maternal Uncle 44       GI cancer - ? stomach   Cancer Cousin 54       mat female first cousin (son of mat uncle with GI cancer) with ureter cancer   Colon cancer Neg Hx    Esophageal cancer Neg Hx    Rectal cancer Neg Hx    Colon polyps Neg Hx    Sleep apnea Neg Hx     Her Social History Is Significant For: Social History   Socioeconomic History   Marital status: Widowed    Spouse name: Not on file   Number of children: 4   Years of education: Not on file   Highest education level: Not on file  Occupational History   Occupation: retired  Tobacco Use   Smoking status: Never   Smokeless tobacco: Never  Vaping Use   Vaping Use: Never used  Substance and  Sexual Activity   Alcohol use: Yes    Comment: occasionally  wine/beer   Drug use: No   Sexual activity: Never    Birth control/protection: Surgical    Comment: Vaginal hysterectomy  Other Topics Concern   Not on file  Social History Narrative   Not on file   Social Determinants of Health   Financial Resource Strain: Not on file  Food Insecurity: Not on file  Transportation Needs: Not on file  Physical Activity: Not on file  Stress: Not on file  Social Connections: Not on file    Her Allergies Are:  Allergies  Allergen Reactions   Statins     Muscle aches  :   Her Current Medications Are:  Outpatient Encounter Medications as of 03/03/2023  Medication Sig   aspirin EC 81 MG tablet Take 1 tablet (81 mg total) by mouth daily. Swallow whole.   B Complex Vitamins (VITAMIN B COMPLEX PO) Take 1 tablet by mouth daily.    calcium carbonate (TUMS) 500 MG chewable tablet Chew 2 tablets (400 mg of elemental calcium total) by mouth 3 times/day as needed-between meals & bedtime for indigestion or heartburn.   carvedilol (COREG) 25 MG tablet Take 25 mg by mouth 2 (two) times daily with a meal.   furosemide (LASIX) 20 MG tablet Take 20 mg by mouth daily.   hyoscyamine (LEVSIN) 0.125 MG tablet TAKE 1-2 TABLET EVERY 4-6 HOURS AS NEEDED FOR ABDOMINAL PAIN   loratadine (CLARITIN) 10 MG tablet Take 10 mg by mouth daily.   rosuvastatin (CRESTOR) 10 MG tablet Take 1 tablet (10 mg total) by mouth 2 (two) times a week.   spironolactone (ALDACTONE) 25 MG tablet TAKE 1/2 TABLET BY MOUTH EVERY DAY   dicyclomine (BENTYL) 10 MG capsule TAKE 1 CAPSULE (10 MG TOTAL) BY MOUTH 4 TIMES A DAY BEFORE MEALS AND AT BEDTIME   No facility-administered encounter medications on file as of 03/03/2023.  :   Review of Systems:  Out of a complete 14 point review of systems, all are reviewed and negative with the exception of these symptoms as listed below:   Review of Systems  Neurological:        Pt here for  sleep consult  Pt is fatigue,hypertension,CHF  Pt denies sleep study,CPAP machine,snoring,headaches    ESS:2 FSS:21     Objective:  Neurological Exam  Physical Exam Physical Examination:   Vitals:   03/03/23 1418  BP: (!) 146/71  Pulse: 72    General Examination: The patient is a very pleasant 88 y.o. female in no acute distress. She appears well-developed and well-nourished and well groomed.   HEENT: Normocephalic, atraumatic, pupils are equal, round and reactive to light, corrective eyeglasses in place.  Extraocular tracking is good without limitation to gaze excursion or nystagmus noted. Hearing is grossly intact. Face is symmetric with normal facial animation. Speech is clear with no dysarthria noted. There is no hypophonia. There is no lip, neck/head, jaw or voice tremor. Neck is supple with full range of passive and active motion. There are no carotid bruits on auscultation. Oropharynx exam reveals: mild mouth dryness, adequate dental hygiene and moderate airway crowding, due to small airway entry and thicker soft palate, slightly wider uvula, Mallampati class II, neck circumference 14 inches, minimal overbite noted.  Tongue protrudes centrally and palate elevates symmetrically, absent tonsils.  Chest: Clear to auscultation without wheezing, rhonchi or crackles noted.  Heart: S1+S2+0, regular and normal without murmurs, rubs or gallops noted.   Abdomen: Soft, non-tender and non-distended.  Extremities: There is no pitting edema in the distal lower extremities bilaterally.   Skin: Warm and dry without trophic changes noted.   Musculoskeletal: exam reveals no obvious joint deformities.   Neurologically:  Mental status: The patient is awake, alert and oriented in all 4 spheres. Her immediate and remote memory, attention, language skills and fund of knowledge are appropriate. There is no evidence of aphasia, agnosia, apraxia or anomia. Speech is clear with normal prosody and  enunciation. Thought process is linear. Mood is normal and affect is normal.  Cranial nerves II - XII are as  described above under HEENT exam.  Motor exam: Normal bulk, strength and tone is noted. There is no obvious action or resting tremor.  Fine motor skills and coordination: grossly intact.  Cerebellar testing: No dysmetria or intention tremor. There is no truncal or gait ataxia.  Sensory exam: intact to light touch in the upper and lower extremities.  Gait, station and balance: She stands easily. No veering to one side is noted. No leaning to one side is noted. Posture is age-appropriate and stance is narrow based. Gait shows normal stride length and normal pace. No problems turning are noted.   Assessment and Plan:   In summary, Carla Bennett is a very pleasant 88 y.o.-year old female with an underlying complex medical history of aortic regurgitation, breast cancer with status post left lumpectomy in 2016, chemotherapy and radioactive seed implant with radiation treatment, chronic systolic congestive heart failure, hypertension, mitral regurgitation, arthritis, cholelithiasis, diverticulitis, gout, vitamin D deficiency, and overweight state, whose history and physical exam are concerning for sleep disordered breathing, particularly obstructive sleep apnea (OSA). A laboratory attended sleep study is typically considered "gold standard" for evaluation of sleep disordered breathing.   I had a long chat with the patient about my findings and the diagnosis of sleep apnea, particularly OSA, its prognosis and treatment options. We talked about medical/conservative treatments, surgical interventions and non-pharmacological approaches for symptom control. I explained, in particular, the risks and ramifications of untreated moderate to severe OSA, especially with respect to developing cardiovascular disease down the road, including congestive heart failure (CHF), difficult to treat hypertension, cardiac  arrhythmias (particularly A-fib), neurovascular complications including TIA, stroke and dementia. Even type 2 diabetes has, in part, been linked to untreated OSA. Symptoms of untreated OSA may include (but may not be limited to) daytime sleepiness, nocturia (i.e. frequent nighttime urination), memory problems, mood irritability and suboptimally controlled or worsening mood disorder such as depression and/or anxiety, lack of energy, lack of motivation, physical discomfort, as well as recurrent headaches, especially morning or nocturnal headaches. We talked about the importance of maintaining a healthy lifestyle and striving for healthy weight.   I recommended a sleep study at this time. I outlined the differences between a laboratory attended sleep study which is considered more comprehensive and accurate over the option of a home sleep test (HST); the latter may lead to underestimation of sleep disordered breathing in some instances and does not help with diagnosing upper airway resistance syndrome and is not accurate enough to diagnose primary central sleep apnea typically. In preparation for her sleep study, she is encouraged to try to move up her bedtime a little bit.  She is initially reluctant but willing to proceed with a sleep study in the sleep lab. I outlined possible surgical and non-surgical treatment options of OSA, including the use of a positive airway pressure (PAP) device (i.e. CPAP, AutoPAP/APAP or BiPAP in certain circumstances), a custom-made dental device (aka oral appliance, which would require a referral to a specialist dentist or orthodontist typically, and is generally speaking not considered for patients with full dentures or edentulous state), upper airway surgical options, such as traditional UPPP (which is not considered a first-line treatment) or the Inspire device (hypoglossal nerve stimulator, which would involve a referral for consultation with an ENT surgeon, after careful  selection, following inclusion criteria - also not first-line treatment). I explained the PAP treatment option to the patient in detail, as this is generally considered first-line treatment.  The patient indicated that she would  be willing to try PAP therapy, if the need arises. I explained the importance of being compliant with PAP treatment, not only for insurance purposes but primarily to improve patient's symptoms symptoms, and for the patient's long term health benefit, including to reduce Her cardiovascular risks longer-term.    We will pick up our discussion about the next steps and treatment options after testing.  We will keep her posted as to the test results by phone call and/or MyChart messaging where possible.  We will plan to follow-up in sleep clinic accordingly as well.  I answered all her questions today and the patient was in agreement.   I encouraged her to call with any interim questions, concerns, problems or updates or email [place] through MyChart.  Generally speaking, sleep test authorizations may take up to 2 weeks, sometimes less, sometimes longer, the patient is encouraged to get in touch with [place] if they do not hear back from the sleep lab staff directly within the next 2 weeks.  Thank you very much for allowing me to participate in the care of this nice patient. If I can be of any further assistance to you please do not hesitate to call me at [phone].  Sincerely,   Huston Foley, MD, PhD

## 2023-03-03 NOTE — Patient Instructions (Signed)

## 2023-03-12 ENCOUNTER — Telehealth: Payer: Self-pay | Admitting: Neurology

## 2023-03-12 ENCOUNTER — Telehealth: Payer: Self-pay | Admitting: Internal Medicine

## 2023-03-12 NOTE — Telephone Encounter (Signed)
5/23:lvm-mla 03/04/23 Medicare/[employer]etna supp no auth req EE

## 2023-03-12 NOTE — Telephone Encounter (Signed)
Inbound call from patient, states she needs a new order for her medication for dicyclomine, states she wants to change the order for one a day instead of after each meal.

## 2023-03-13 MED ORDER — DICYCLOMINE HCL 10 MG PO CAPS: 10.0000 mg | ORAL_CAPSULE | Freq: Every day | ORAL | 1 refills | Status: DC

## 2023-03-13 NOTE — Telephone Encounter (Signed)
Spoke with patient - refilled Bentyl - she is currently just taking it once a day

## 2023-04-29 DIAGNOSIS — Z789 Other specified health status: Secondary | ICD-10-CM | POA: Diagnosis not present

## 2023-04-29 DIAGNOSIS — I10 Essential (primary) hypertension: Secondary | ICD-10-CM | POA: Diagnosis not present

## 2023-04-29 DIAGNOSIS — E782 Mixed hyperlipidemia: Secondary | ICD-10-CM | POA: Diagnosis not present

## 2023-05-06 DIAGNOSIS — I429 Cardiomyopathy, unspecified: Secondary | ICD-10-CM | POA: Diagnosis not present

## 2023-05-06 DIAGNOSIS — I129 Hypertensive chronic kidney disease with stage 1 through stage 4 chronic kidney disease, or unspecified chronic kidney disease: Secondary | ICD-10-CM | POA: Diagnosis not present

## 2023-05-06 DIAGNOSIS — N1831 Chronic kidney disease, stage 3a: Secondary | ICD-10-CM | POA: Diagnosis not present

## 2023-05-25 DIAGNOSIS — Z1231 Encounter for screening mammogram for malignant neoplasm of breast: Secondary | ICD-10-CM | POA: Diagnosis not present

## 2023-06-02 ENCOUNTER — Ambulatory Visit: Payer: Medicare Other | Admitting: Cardiology

## 2023-06-11 ENCOUNTER — Other Ambulatory Visit: Payer: Self-pay | Admitting: Internal Medicine

## 2023-06-15 DIAGNOSIS — I10 Essential (primary) hypertension: Secondary | ICD-10-CM | POA: Diagnosis not present

## 2023-06-15 DIAGNOSIS — E785 Hyperlipidemia, unspecified: Secondary | ICD-10-CM | POA: Diagnosis not present

## 2023-06-19 DIAGNOSIS — N289 Disorder of kidney and ureter, unspecified: Secondary | ICD-10-CM | POA: Diagnosis not present

## 2023-06-19 DIAGNOSIS — I10 Essential (primary) hypertension: Secondary | ICD-10-CM | POA: Diagnosis not present

## 2023-06-19 DIAGNOSIS — Z789 Other specified health status: Secondary | ICD-10-CM | POA: Diagnosis not present

## 2023-06-19 DIAGNOSIS — E782 Mixed hyperlipidemia: Secondary | ICD-10-CM | POA: Diagnosis not present

## 2023-06-26 DIAGNOSIS — R300 Dysuria: Secondary | ICD-10-CM | POA: Diagnosis not present

## 2023-06-26 DIAGNOSIS — R350 Frequency of micturition: Secondary | ICD-10-CM | POA: Diagnosis not present

## 2023-06-26 DIAGNOSIS — R3915 Urgency of urination: Secondary | ICD-10-CM | POA: Diagnosis not present

## 2023-06-26 DIAGNOSIS — R319 Hematuria, unspecified: Secondary | ICD-10-CM | POA: Diagnosis not present

## 2023-07-08 DIAGNOSIS — Z23 Encounter for immunization: Secondary | ICD-10-CM | POA: Diagnosis not present

## 2023-07-08 DIAGNOSIS — N289 Disorder of kidney and ureter, unspecified: Secondary | ICD-10-CM | POA: Diagnosis not present

## 2023-07-08 DIAGNOSIS — Z7185 Encounter for immunization safety counseling: Secondary | ICD-10-CM | POA: Diagnosis not present

## 2023-07-08 DIAGNOSIS — I10 Essential (primary) hypertension: Secondary | ICD-10-CM | POA: Diagnosis not present

## 2023-07-29 ENCOUNTER — Encounter: Payer: Self-pay | Admitting: Physician Assistant

## 2023-07-29 ENCOUNTER — Ambulatory Visit: Payer: Medicare Other | Attending: Cardiology | Admitting: Physician Assistant

## 2023-07-29 VITALS — BP 158/86 | HR 73 | Ht 60.0 in | Wt 149.0 lb

## 2023-07-29 DIAGNOSIS — N184 Chronic kidney disease, stage 4 (severe): Secondary | ICD-10-CM

## 2023-07-29 DIAGNOSIS — I5022 Chronic systolic (congestive) heart failure: Secondary | ICD-10-CM | POA: Diagnosis not present

## 2023-07-29 DIAGNOSIS — I10 Essential (primary) hypertension: Secondary | ICD-10-CM | POA: Diagnosis not present

## 2023-07-29 DIAGNOSIS — R55 Syncope and collapse: Secondary | ICD-10-CM | POA: Diagnosis not present

## 2023-07-29 NOTE — Patient Instructions (Signed)
Medication Instructions:  Your physician recommends that you continue on your current medications as directed. Please refer to the Current Medication list given to you today. *If you need a refill on your cardiac medications before your next appointment, please call your pharmacy*   Lab Work: None ordered If you have labs (blood work) drawn today and your tests are completely normal, you will receive your results only by: MyChart Message (if you have MyChart) OR A paper copy in the mail If you have any lab test that is abnormal or we need to change your treatment, we will call you to review the results.   Testing/Procedures: None ordered   Follow-Up: At [facility], you and your health needs are our priority.  As part of our continuing mission to provide you with exceptional heart care, we have created designated Provider Care Teams.  These Care Teams include your primary Cardiologist (physician) and Advanced Practice Providers (APPs -  Physician Assistants and Nurse Practitioners) who all work together to provide you with the care you need, when you need it.  We recommend signing up for the patient portal called "MyChart".  Sign up information is provided on this After Visit Summary.  MyChart is used to connect with patients for Virtual Visits (Telemedicine).  Patients are able to view lab/test results, encounter notes, upcoming appointments, etc.  Non-urgent messages can be sent to your provider as well.   To learn more about what you can do with MyChart, go to [email or web address].    Your next appointment:   12 month(s)  Provider:   Donato Schultz, MD     Other Instructions Check your blood pressure daily for 2 weeks, then contact the office with your readings.  Make sure to check 2 hours after your medications.   AVOID these things for 30 minutes before checking your blood pressure: No Drinking caffeine. No Drinking alcohol. No Eating. No Smoking. No  Exercising.  Five minutes before checking your blood pressure: Pee. Sit in a dining chair. Avoid sitting in a soft couch or armchair. Be quiet. Do not talk.

## 2023-07-29 NOTE — Progress Notes (Signed)
Office Visit    Patient Name: Carla Bennett Date of Encounter: 07/29/2023  PCP:  Lewis Moccasin, MD   [facility]  Cardiologist:  Donato Schultz, MD  Advanced Practice Provider:  No care team member to display Electrophysiologist:  None   HPI    Carla Bennett is a 88 y.o. female with a past medical history of hypertension, CHF, prior chemotherapy for breast cancer, pericardial effusion presents today for follow-up for hypotension.  On her echocardiogram originally, her ejection fraction was markedly reduced at 20 to 25%.  She had been treated with Adriamycin.  She also showed moderate pericardial effusion.  She had an echocardiogram performed 03/13/2015 which at that time showed no evidence of pericardial effusion.  Mild mitral and aortic regurgitation noted.  Normal ejection fraction.  This was prechemotherapy for breast cancer.  She had a moderately sized pericardial effusion viewed on CT scan 01/2016.  Aortic atherosclerosis with mild coronary artery atherosclerosis was also noted.  Pleural effusions were also noted.  Subsequent follow-up with an echo 1 month later showed ejection fraction 35%.  Improved.  Effusion was trace.  She was still having some GI issues for several years and ended up having a diet where she eliminated many fruits.  She was following Dr. Marina Goodell.  Spironolactone was previously stopped due to hypotension and lethargy but was eventually restarted.  She was last seen 02/2022 and noted that her blood pressure was low.  It was 116/60 in the clinic.  She states that she feels best when her blood pressure is 130 systolic.  When her BP is lower she feels like she is going to pass out.  Per her blood pressure log, there were a few low readings typically in the mid afternoons.  She notes this is when she usually feels the worst with no energy.  Sometimes she forgets to take her nighttime dose until reports she goes to bed instead of around 9  PM.  Lately she admits to not walking much for exercise.  She planned to work on this.  I saw her 07/18/2022, she provided me with an extensive log which involved blood pressures taken in the morning before medications, blood pressures taken midday, and blood pressures taken in the evening (unclear if it was before or after medications).  Blood pressures in the morning and evening were generally high and then midday was dropping low and this is when the patient feels the worst.  Lowest blood pressure noted midday was 99/53 with a pulse rate of 63.  She is not fluid overloaded today so we have changed her Lasix to as needed and continued her on her spironolactone which she takes at night.  She also tells me that when she is in pain her blood pressure spikes up which is a normal response.  We encouraged her to stay hydrated throughout the day.  We also ordered an echocardiogram since the patient was concerned about an episode of passing out.  She takes most of her blood pressure pills at night but is still taking Coreg 12.5 mg.  We discussed taking blood pressure an hour after morning medicines and an hour after evening medicines and keeping track of those values.  Today, she feels pretty good today.  No cardiac complaints.  We reviewed her most recent testing including echocardiogram, monitor, carotid artery ultrasound.  Questions were answered.  She has been doing well on her current medication regimen.  Her primary care recently changed  her lisinopril to 20 mg in the morning and 20 mg in the evening to help stabilize her blood pressures.  She brings in a log today and it seems that she takes her blood pressure before medications in the morning it is typically high then she will take it at lunchtime and it drops down much lower close to 100 systolic and then by the evening time it will be back up to 150s/160s systolic and this is typically after her evening dose of lisinopril.  She changed her Lasix to as needed  and it seems like the lunchtime blood pressure reading now is not quite as low and she feels pretty good during that time.  However, we did discuss changing her lisinopril to valsartan or olmesartan and she was hesitant because she had been on lisinopril for so long.  She wanted me to discuss this with Dr. Anne Fu and her kidney doctor before making the change.  Reports no shortness of breath nor dyspnea on exertion. Reports no chest pain, pressure, or tightness. No edema, orthopnea, PND. Reports no palpitations.     Past Medical History    Past Medical History:  Diagnosis Date   Allergy    Aortic regurgitation    Arthritis    hands, neck   Basal cell carcinoma    Benign neoplasm of colon    Breast cancer of upper-outer quadrant of left female breast (HCC) 02/23/2015   4'16-left breast cancer-surgery, chemo, radiation-[name] follows every 3 months   Cataract    beginning stages-monitoring by MD   Cholelithiasis    resolved with surgery   Chronic systolic CHF (congestive heart failure) (HCC) 02/26/2016   A.  Echo 02/21/16:  Mild LVH, EF 20-25%, diff HK, Gr 3 DD, mod AI, mod to severe MR, mod LAE, mod redcued RVSF, mild RAE, mod TR, PASP 53 mmHg, small pericardial effusion, mod L pleural effusion  //  B. Echo 6/17:  Mild LVH EF 30-35%, diffuse HK, grade 1 diastolic dysfunction, trivial AI, MAC, mild LAE, normal RVSF, trivial pericardial effusion     Cystocele    Diverticulitis    Diverticulosis of colon (without mention of hemorrhage)    Gout    resolved -   Hearing loss    some hearing loss in right ear- no hearing aids   Heart murmur    Hematuria    negative urology work up   History of blood transfusion 1971   x 1 with D&C -    Hx of abnormal cervical Pap smear    LEEP-CIN I, negative margins and ECC   Hypercalcemia    Hypertension    Mitral regurgitation    Neuromuscular disorder (HCC)    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   OA (osteoarthritis) of hip    right   Other issue  of medical certificates    uterine myomata   Parathyroid abnormality (HCC)    being followed Dr. Ester Rink   Radiation 10/01/15-11/16/15   left breast axillary and supraclavicular reigion 45 gray, left breast 50.4 gray, lumpectomy cavity boost 10 gray   Renal disease 3/16   Stage 3 kidney disease-seeing Dr Sharl Ma and Dr Arrie Aran   Uterine prolaps    resolved after surgery   Vitamin D deficiency    Past Surgical History:  Procedure Laterality Date   BREAST LUMPECTOMY Left 08/2015   CHOLECYSTECTOMY     COLONOSCOPY  01/18/2013   [name]   COLONOSCOPY N/A 06/26/2016   Procedure: COLONOSCOPY;  Surgeon: Hilarie Fredrickson, MD;  Location: [facility];  Service: Endoscopy;  Laterality: N/A;   CYSTOCELE REPAIR  08/2010   Rectocele, vault prolapse   DILATION AND CURETTAGE OF UTERUS     x2   PORT-A-CATH REMOVAL Right 08/23/2015   Procedure: REMOVAL PORT-A-CATH;  Surgeon: Harriette Bouillon, MD;  Location: [facility];  Service: General;  Laterality: Right;   PORTACATH PLACEMENT Right 03/20/2015   Procedure: INSERTION PORT-A-CATH;  Surgeon: Harriette Bouillon, MD;  Location: [facility] OR;  Service: General;  Laterality: Right;   RADIOACTIVE SEED GUIDED PARTIAL MASTECTOMY WITH AXILLARY SENTINEL LYMPH NODE BIOPSY Left 08/23/2015   Procedure: LEFT BREAST PARTIAL MASTECTOMY WITH SENTINEL LYMPH NODE MAPPING;  Surgeon: Harriette Bouillon, MD;  Location: [facility];  Service: General;  Laterality: Left;   TONSILLECTOMY AND ADENOIDECTOMY     TOTAL VAGINAL HYSTERECTOMY     secondary to prolapse, ovaries not removed    Allergies  Allergies  Allergen Reactions   Statins     Muscle aches     EKGs/Labs/Other Studies Reviewed:   The following studies were reviewed today:   CT Chest 12/18/2020: FINDINGS: Cardiovascular: No significant vascular findings. Normal heart size. No pericardial effusion.   Mediastinum/Nodes: No axillary supraclavicular adenopathy. No mediastinal adenopathy. Trachea and  esophagus are normal. Small hiatal hernia.   Lungs/Pleura: No pneumothorax or pulmonary contusion. No pleural fluid. No aspiration. Mild RIGHT lower lobe bronchial thickening.   Upper Abdomen: Limited view of the liver, kidneys, pancreas are unremarkable. Normal adrenal glands.   Musculoskeletal: No rib fracture.  No spine fracture   IMPRESSION: 1. No evidence of thoracic trauma. 2. Hiatal hernia. 3. Mild bronchitis in the RIGHT lower lobe   Echo TTE 01/21/2017: - Left ventricle: The cavity size was normal. Wall thickness was    normal. Systolic function was mildly reduced. The estimated    ejection fraction was in the range of 45% to 50%. Inferolateral    hypokinesis. Abnormal GLPSS at -14% wtih inferior strain    abnormality. Doppler parameters are consistent with abnormal left    ventricular relaxation (grade 1 diastolic dysfunction). The E/e&'    ratio is >15, suggesting elevated LV filling pressure.  - Aortic valve: Trileaflet. Sclerosis without stenosis. There was    trivial regurgitation.  - Left atrium: The atrium was normal in size.  - Inferior vena cava: The vessel was normal in size. The    respirophasic diameter changes were in the normal range (>= 50%),    consistent with normal central venous pressure.   Impressions:  - Compared to a prior study in 2017, the LVEF has improved to    45-50% - there is inferior and inferolateral hypokinesis.   EKG:  EKG is  ordered today.  The ekg ordered today demonstrates normal sinus rhythm, rate 70 bpm.  Recent Labs: 08/29/2022: ALT 14; TSH 2.584 09/10/2022: BUN 17; Creatinine 1.1; Hemoglobin 14.7; Platelets 413; Potassium 4.8; Sodium 136  Recent Lipid Panel    Component Value Date/Time   CHOL 113 08/29/2022 0558   CHOL 140 11/29/2020 1004   TRIG 74 08/29/2022 0558   HDL 44 08/29/2022 0558   HDL 54 11/29/2020 1004   CHOLHDL 2.6 08/29/2022 0558   VLDL 15 08/29/2022 0558   LDLCALC 54 08/29/2022 0558   LDLCALC 64  11/29/2020 1004     Home Medications   Current Meds  Medication Sig   aspirin EC 81 MG tablet Take 1 tablet (81 mg total) by mouth daily.  Swallow whole.   B Complex Vitamins (VITAMIN B COMPLEX PO) Take 1 tablet by mouth daily.    calcium carbonate (TUMS) 500 MG chewable tablet Chew 2 tablets (400 mg of elemental calcium total) by mouth 3 times/day as needed-between meals & bedtime for indigestion or heartburn.   carvedilol (COREG) 25 MG tablet Take 25 mg by mouth 2 (two) times daily with a meal.   dicyclomine (BENTYL) 10 MG capsule TAKE 1 CAPSULE BY MOUTH EVERY DAY   furosemide (LASIX) 20 MG tablet Take 20 mg by mouth as needed for fluid or edema.   hyoscyamine (LEVSIN) 0.125 MG tablet TAKE 1-2 TABLET EVERY 4-6 HOURS AS NEEDED FOR ABDOMINAL PAIN   lisinopril (ZESTRIL) 40 MG tablet Take 0.5 tablets by mouth in the morning and at bedtime.   loratadine (CLARITIN) 10 MG tablet Take 10 mg by mouth daily.   rosuvastatin (CRESTOR) 10 MG tablet Take 1 tablet (10 mg total) by mouth 2 (two) times a week.   spironolactone (ALDACTONE) 25 MG tablet TAKE 1/2 TABLET BY MOUTH EVERY DAY     Review of Systems      All other systems reviewed and are otherwise negative except as noted above.  Physical Exam    VS:  BP (!) 158/86   Pulse 73   Ht 5' (1.524 m)   Wt 149 lb (67.6 kg)   LMP 10/20/1996   SpO2 97%   BMI 29.10 kg/m  , BMI Body mass index is 29.1 kg/m.  Wt Readings from Last 3 Encounters:  07/29/23 149 lb (67.6 kg)  03/03/23 150 lb (68 kg)  12/16/22 149 lb 14.4 oz (68 kg)     GEN: Well nourished, well developed, in no acute distress. HEENT: normal. Neck: Supple, no JVD, carotid bruits, or masses. Cardiac: RRR, no murmurs, rubs, or gallops. No clubbing, cyanosis, edema.  Radials/PT 2+ and equal bilaterally.  Respiratory:  Respirations regular and unlabored, clear to auscultation bilaterally. GI: Soft, nontender, nondistended. MS: No deformity or atrophy. Skin: Warm and dry, no  rash. Neuro:  Strength and sensation are intact. Psych: Normal affect.  Assessment & Plan    Chronic systolic CHF -Euvolemic on exam -We changed her Lasix 40 mg daily to as needed for lower extremity edema -Continue spironolactone 12.5 mg at night  Essential hypertension -BP log shows a.m. blood pressure is high (before medication)  middle of the day blood pressures low (around 100 systolic)  and end of the day blood pressures up to 150/160 systolic. -We made her Lasix as needed and continued all other medications -Her PCP changed her lisinopril to 20 in the morning and 20 in the evening.  This seems to have stabilized things a little  CKD -Most recent creatinine 1.19 -She is followed by nephrology  4. Syncope x 1 - echocardiogram reviewed -no further syncope -She had recent labs per nephrology    Disposition: Follow up 12 months with Donato Schultz, MD or APP.  Signed, Sharlene Dory, PA-C 07/29/2023, 4:39 PM [facility]

## 2023-07-30 DIAGNOSIS — I10 Essential (primary) hypertension: Secondary | ICD-10-CM

## 2023-07-31 ENCOUNTER — Other Ambulatory Visit: Payer: Self-pay

## 2023-07-31 MED ORDER — OLMESARTAN MEDOXOMIL 40 MG PO TABS: 40.0000 mg | ORAL_TABLET | Freq: Every day | ORAL | 3 refills | Status: DC

## 2023-07-31 NOTE — Telephone Encounter (Signed)
Asa Lente, Tessa N, PA-C  P Cv [facility] Triage Please order a BMP 2 weeks after switch of medication. Thanks!

## 2023-08-05 ENCOUNTER — Telehealth: Payer: Self-pay

## 2023-08-05 NOTE — Telephone Encounter (Signed)
Call to patient who states she is only picking up her olmesartan today and would not have been on it if she performs her labs on 08/14/23. Moved lab appointment to 08/19/23 at patient's request.

## 2023-08-13 ENCOUNTER — Other Ambulatory Visit: Payer: Medicare Other

## 2023-08-13 ENCOUNTER — Ambulatory Visit: Payer: Medicare Other | Admitting: Family

## 2023-08-14 ENCOUNTER — Other Ambulatory Visit: Payer: Medicare Other

## 2023-08-19 ENCOUNTER — Ambulatory Visit: Payer: Medicare Other | Attending: Physician Assistant

## 2023-08-19 DIAGNOSIS — I10 Essential (primary) hypertension: Secondary | ICD-10-CM | POA: Diagnosis not present

## 2023-08-20 LAB — BASIC METABOLIC PANEL
BUN/Creatinine Ratio: 14 (ref 12–28)
BUN: 16 mg/dL (ref 8–27)
CO2: 25 mmol/L (ref 20–29)
Calcium: 10.6 mg/dL — ABNORMAL HIGH (ref 8.7–10.3)
Chloride: 104 mmol/L (ref 96–106)
Creatinine, Ser: 1.15 mg/dL — ABNORMAL HIGH (ref 0.57–1.00)
Glucose: 85 mg/dL (ref 70–99)
Potassium: 5.1 mmol/L (ref 3.5–5.2)
Sodium: 140 mmol/L (ref 134–144)
eGFR: 46 mL/min/{1.73_m2} — ABNORMAL LOW (ref >59)

## 2023-08-21 ENCOUNTER — Telehealth: Payer: Self-pay | Admitting: Cardiology

## 2023-08-21 NOTE — Telephone Encounter (Signed)
Reviewed pt's recent lab results.  She states understanding.  All questions, if any were answered at the time of the call.

## 2023-08-21 NOTE — Telephone Encounter (Signed)
Patient returned RN's call regarding results. 

## 2023-09-07 DIAGNOSIS — I129 Hypertensive chronic kidney disease with stage 1 through stage 4 chronic kidney disease, or unspecified chronic kidney disease: Secondary | ICD-10-CM | POA: Diagnosis not present

## 2023-09-07 DIAGNOSIS — N1831 Chronic kidney disease, stage 3a: Secondary | ICD-10-CM | POA: Diagnosis not present

## 2023-09-07 DIAGNOSIS — I429 Cardiomyopathy, unspecified: Secondary | ICD-10-CM | POA: Diagnosis not present

## 2023-10-17 ENCOUNTER — Other Ambulatory Visit: Payer: Self-pay | Admitting: Internal Medicine

## 2023-12-04 DIAGNOSIS — Z1339 Encounter for screening examination for other mental health and behavioral disorders: Secondary | ICD-10-CM | POA: Diagnosis not present

## 2023-12-04 DIAGNOSIS — N289 Disorder of kidney and ureter, unspecified: Secondary | ICD-10-CM | POA: Diagnosis not present

## 2023-12-04 DIAGNOSIS — Z1331 Encounter for screening for depression: Secondary | ICD-10-CM | POA: Diagnosis not present

## 2023-12-04 DIAGNOSIS — Z0000 Encounter for general adult medical examination without abnormal findings: Secondary | ICD-10-CM | POA: Diagnosis not present

## 2023-12-04 DIAGNOSIS — M1990 Unspecified osteoarthritis, unspecified site: Secondary | ICD-10-CM | POA: Diagnosis not present

## 2023-12-04 DIAGNOSIS — I10 Essential (primary) hypertension: Secondary | ICD-10-CM | POA: Diagnosis not present

## 2023-12-04 DIAGNOSIS — E785 Hyperlipidemia, unspecified: Secondary | ICD-10-CM | POA: Diagnosis not present

## 2023-12-08 DIAGNOSIS — H903 Sensorineural hearing loss, bilateral: Secondary | ICD-10-CM | POA: Diagnosis not present

## 2023-12-21 ENCOUNTER — Inpatient Hospital Stay: Payer: Medicare Other | Attending: Hematology and Oncology | Admitting: Hematology and Oncology

## 2023-12-21 VITALS — BP 159/78 | HR 78 | Temp 98.5°F | Resp 19 | Ht 60.0 in | Wt 155.8 lb

## 2023-12-21 DIAGNOSIS — Z171 Estrogen receptor negative status [ER-]: Secondary | ICD-10-CM

## 2023-12-21 DIAGNOSIS — Z08 Encounter for follow-up examination after completed treatment for malignant neoplasm: Secondary | ICD-10-CM | POA: Diagnosis not present

## 2023-12-21 DIAGNOSIS — Z853 Personal history of malignant neoplasm of breast: Secondary | ICD-10-CM | POA: Insufficient documentation

## 2023-12-21 DIAGNOSIS — C50412 Malignant neoplasm of upper-outer quadrant of left female breast: Secondary | ICD-10-CM | POA: Diagnosis not present

## 2023-12-21 DIAGNOSIS — Z923 Personal history of irradiation: Secondary | ICD-10-CM | POA: Diagnosis not present

## 2023-12-21 NOTE — Assessment & Plan Note (Signed)
 02/15/2015: Left breast invasive ductal carcinoma grade 3 ER 0% PR 0% HER-2 negative Ki-67 75%, 2.9 cm mass with microcalcifications plus abnormal lymph nodes biopsy-proven to be breast cancer T2 N1 M0 stage IIB clinical stage 04/15/2015 -08/02/2015 S/P Neoadjuvant Dose dense AC folll by Abraxane X 12  left lumpectomy 08/23/2015 : Invasive ductal carcinoma grade 3, 2.8 cm, associated extensive DCIS with comedonecrosis, margins negative,1/2 sentinel nodes positive, ER 0%, PR 0% T2 N1 M0 stage IIB Completed radiation therapy 11/16/2015    I offered the patient adjuvant Xeloda  But she refused.   Hypercalcemia: Ultrasound of the neck had revealed a parathyroid adenoma.  She now follows with nephrology and has been doing quite well with calcium levels in the normal range.   Breast Cancer Surveillance: 1. Breast exam 12/21/2023: Benign 2. Mammogram [facility]/25/2023 at [facility]: Benign, density category B 3.  Bone density 05/19/2022 at [facility]: T score -1.2: Very mild osteopenia    Return to clinic in 1 yr

## 2023-12-21 NOTE — Progress Notes (Signed)
 Patient Care Team: Lewis Moccasin, MD as PCP - General (Family Medicine) Jake Bathe, MD as PCP - Cardiology (Cardiology) Harriette Bouillon, MD as Consulting Physician (General Surgery) Serena Croissant, MD as Consulting Physician (Hematology and Oncology) Antony Blackbird, MD as Consulting Physician (Radiation Oncology) Pershing Proud, RN as Registered Nurse Donnelly Angelica, RN as Registered Nurse Damita Lack Marcy Salvo, NP as Nurse Practitioner (Hematology and Oncology)  DIAGNOSIS:  Encounter Diagnosis  Name Primary?   Malignant neoplasm of upper-outer quadrant of left breast in female, estrogen receptor negative (HCC) Yes    SUMMARY OF ONCOLOGIC HISTORY: Oncology History  Breast cancer of upper-outer quadrant of left female breast (HCC)  02/15/2015 Mammogram   Left breast increased density, mass is irregular with microcalcifications measuring 2.9 cm, cyst in the breast as well as abnormal enlarged lymph nodes   02/20/2015 Initial Diagnosis   Left breast biopsy: Invasive ductal carcinoma with DCIS, axillary lymph node biopsy positive, ER 0%, PR 0%, HER-2 negative ratio 1.13 with Ki-67 75%   03/08/2015 Procedure   OvaNext panel [place] [employer]) reveals no clinically significant variant at ATM, BARD1, BRCA1, BRCA2, BRIP1, CDH1, CHEK2, EPCAM, MLH1, MRE11A, MSH2, MSH6, MUTYH, NBN, NF1, PALB2, PMS2, PTEN, RAD50, RAD51C, RAD51D, SMARCA4, STK11, and TP53.    03/15/2015 PET scan   2.8 cm solid irregular left breast mass which is hypermetabolic and consistent with known left breast cancer. 2. Weakly positive axillary lymph nodes or equivocal   03/20/2015 Breast MRI   3.1 cm irregular enhancing mass located within the left breast at the 1 o'clock position; 1.6 cm Left axillary LN   03/20/2015 Clinical Stage   Stage IIB: T2 N1   03/22/2015 - 08/02/2015 Neo-Adjuvant Chemotherapy   Neo-adjuvant chemotherapy with dose dense Adriamycin and Cytoxan 4 followed by Abraxane weekly 12   08/06/2015  Breast MRI   Left breast now measures 0.9 x 0.8 x 1.9 cm (previously measured 3.0 x 3.1 x 1.8 cm). Dec size of abnormal Left axill LN   08/23/2015 Surgery   Left lumpectomy: Invasive ductal carcinoma grade 3, 2.8 cm, associated extensive DCIS with comedonecrosis, margins negative,1/2 sentinel nodes positive, ER 0%, PR 0%, HER2/neu repeated and remains negative (ratio 1.13)   08/23/2015 Pathologic Stage   Stage IIB: ypT2 ypN1   10/01/2015 - 11/16/2015 Radiation Therapy   Adjuvant RT: Left breast axillary and supraclavicular region, 45 gray in 25 fractions, the left breast received 3 additional treatments for a cumulative dose of 50.4 gray. Lumpectomy cavity boost 10 gray in 5 fractions   12/27/2015 Survivorship   Survivorship visit completed and copy of care plan provided to patient     CHIEF COMPLIANT: Surveillance of breast cancer  HISTORY OF PRESENT ILLNESS:   History of Present Illness The patient, a breast cancer survivor, presents for a routine follow-up nine years post-diagnosis. She reports overall good health, but notes a slowing down. She has a history of parathyroid issues, which have been monitored by her kidney specialist. Calcium levels have been slightly elevated at times, but this has been a consistent pattern for years. No surgical intervention has been deemed necessary.  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     ALLERGIES:  is allergic to statins.  MEDICATIONS:  Current Outpatient Medications  Medication Sig Dispense Refill   aspirin EC 81 MG tablet Take  1 tablet (81 mg total) by mouth daily. Swallow whole.     B Complex Vitamins (VITAMIN B COMPLEX PO) Take 1 tablet by mouth daily.      calcium carbonate (TUMS) 500 MG chewable tablet Chew 2 tablets (400 mg of elemental  calcium total) by mouth 3 times/day as needed-between meals & bedtime for indigestion or heartburn.     carvedilol (COREG) 25 MG tablet Take 25 mg by mouth 2 (two) times daily with a meal.     dicyclomine (BENTYL) 10 MG capsule TAKE 1 CAPSULE BY MOUTH EVERY DAY 90 capsule 1   furosemide (LASIX) 20 MG tablet Take 20 mg by mouth as needed for fluid or edema.     hyoscyamine (LEVSIN) 0.125 MG tablet TAKE 1-2 TABLET EVERY 4-6 HOURS AS NEEDED FOR ABDOMINAL PAIN 90 tablet 0   loratadine (CLARITIN) 10 MG tablet Take 10 mg by mouth daily.     olmesartan (BENICAR) 40 MG tablet Take 1 tablet (40 mg total) by mouth daily. 90 tablet 3   rosuvastatin (CRESTOR) 10 MG tablet Take 1 tablet (10 mg total) by mouth 2 (two) times a week. 25 tablet 3   spironolactone (ALDACTONE) 25 MG tablet TAKE 1/2 TABLET BY MOUTH EVERY DAY 45 tablet 3   No current facility-administered medications for this visit.    PHYSICAL EXAMINATION: ECOG PERFORMANCE STATUS: 1 - Symptomatic but completely ambulatory  Vitals:   12/21/23 1416 12/21/23 1419  BP: (!) 163/76 (!) 159/78  Pulse: 78   Resp: 19   Temp: 98.5 F (36.9 C)   SpO2: 100%    Filed Weights   12/21/23 1416  Weight: 155 lb 12.8 oz (70.7 kg)    Physical Exam MUSCULOSKELETAL: Tenderness in the scar tissue.  (exam performed in the presence of a chaperone)  LABORATORY DATA:  I have reviewed the data as listed    Latest Ref Rng & Units 08/19/2023   12:08 PM 09/10/2022   12:00 AM 08/29/2022   12:00 AM  CMP  Glucose 70 - 99 mg/dL 85   409   BUN 8 - 27 mg/dL 16  17     14    Creatinine 0.57 - 1.00 mg/dL 8.11  1.1     9.14   Sodium 134 - 144 mmol/L 140  136     139   Potassium 3.5 - 5.2 mmol/L 5.1  4.8     4.1   Chloride 96 - 106 mmol/L 104  101     106   CO2 20 - 29 mmol/L 25  25     22    Calcium 8.7 - 10.3 mg/dL 78.2  95.6     21.3   Total Protein 6.5 - 8.1 g/dL   6.9   Total Bilirubin 0.3 - 1.2 mg/dL   0.5   Alkaline Phos 38 - 126 U/L   62   AST 15 - 41  U/L   19   ALT 0 - 44 U/L   14      This result is from an external source.    Lab Results  Component Value Date   WBC 12.9 09/10/2022   HGB 14.7 09/10/2022   HCT 44 09/10/2022   MCV 90.7 08/29/2022   PLT 413 (A) 09/10/2022   NEUTROABS 6.7 08/29/2022    ASSESSMENT & PLAN:  Breast cancer of upper-outer quadrant of left female breast 02/15/2015: Left breast invasive ductal carcinoma grade 3 ER 0% PR 0% HER-2 negative Ki-67 75%, 2.9 cm  mass with microcalcifications plus abnormal lymph nodes biopsy-proven to be breast cancer T2 N1 M0 stage IIB clinical stage 04/15/2015 -08/02/2015 S/P Neoadjuvant Dose dense AC folll by Abraxane X 12  left lumpectomy 08/23/2015 : Invasive ductal carcinoma grade 3, 2.8 cm, associated extensive DCIS with comedonecrosis, margins negative,1/2 sentinel nodes positive, ER 0%, PR 0% T2 N1 M0 stage IIB Completed radiation therapy 11/16/2015    I offered the patient adjuvant Xeloda  But she refused.   Hypercalcemia: Ultrasound of the neck had revealed a parathyroid adenoma.  She now follows with nephrology and has been doing quite well with calcium levels in the normal range.   Breast Cancer Surveillance: 1. Breast exam 12/21/2023: Benign 2. Mammogram 05/25/2023 at [facility]: Benign, density category B 3.  Bone density 05/19/2022 at [facility]: T score -1.2: Very mild osteopenia    Return to clinic in 1 yr ------------------------------------- Assessment and Plan Assessment & Plan Breast Cancer Nine years post-diagnosis, no current complaints or concerns. Mammogram results are good. -Continue annual mammograms.  Parathyroid Hyperplasia Long-standing condition, currently managed by nephrologist. No recent changes in calcium levels or symptoms. -Continue current management under nephrologist.  Congestive Heart Failure No current complaints or concerns mentioned. -Continue current management.  Hearing Loss Patient reports hearing loss, currently managed with hearing  aids. -Continue current management with hearing aids.  Follow-up in one year.      No orders of the defined types were placed in this encounter.  The patient has a good understanding of the overall plan. she agrees with it. she will call with any problems that may develop before the next visit here. Total time spent: 30 mins including face to face time and time spent for planning, charting and co-ordination of care   Tamsen Meek, MD 12/21/23

## 2023-12-23 ENCOUNTER — Encounter: Payer: Self-pay | Admitting: Radiology

## 2024-01-04 DIAGNOSIS — I429 Cardiomyopathy, unspecified: Secondary | ICD-10-CM | POA: Diagnosis not present

## 2024-01-04 DIAGNOSIS — I129 Hypertensive chronic kidney disease with stage 1 through stage 4 chronic kidney disease, or unspecified chronic kidney disease: Secondary | ICD-10-CM | POA: Diagnosis not present

## 2024-01-04 DIAGNOSIS — N1831 Chronic kidney disease, stage 3a: Secondary | ICD-10-CM | POA: Diagnosis not present

## 2024-01-05 DIAGNOSIS — D485 Neoplasm of uncertain behavior of skin: Secondary | ICD-10-CM | POA: Diagnosis not present

## 2024-01-05 DIAGNOSIS — L814 Other melanin hyperpigmentation: Secondary | ICD-10-CM | POA: Diagnosis not present

## 2024-01-05 DIAGNOSIS — L821 Other seborrheic keratosis: Secondary | ICD-10-CM | POA: Diagnosis not present

## 2024-01-10 ENCOUNTER — Other Ambulatory Visit: Payer: Self-pay | Admitting: Internal Medicine

## 2024-01-27 ENCOUNTER — Other Ambulatory Visit: Payer: Self-pay | Admitting: Cardiology

## 2024-02-03 ENCOUNTER — Other Ambulatory Visit: Payer: Self-pay | Admitting: Cardiology

## 2024-02-08 DIAGNOSIS — E785 Hyperlipidemia, unspecified: Secondary | ICD-10-CM | POA: Diagnosis not present

## 2024-02-11 DIAGNOSIS — H52203 Unspecified astigmatism, bilateral: Secondary | ICD-10-CM | POA: Diagnosis not present

## 2024-02-11 DIAGNOSIS — H2513 Age-related nuclear cataract, bilateral: Secondary | ICD-10-CM | POA: Diagnosis not present

## 2024-02-11 DIAGNOSIS — H5203 Hypermetropia, bilateral: Secondary | ICD-10-CM | POA: Diagnosis not present

## 2024-02-12 DIAGNOSIS — E782 Mixed hyperlipidemia: Secondary | ICD-10-CM | POA: Diagnosis not present

## 2024-02-12 DIAGNOSIS — I10 Essential (primary) hypertension: Secondary | ICD-10-CM | POA: Diagnosis not present

## 2024-04-11 ENCOUNTER — Encounter (HOSPITAL_BASED_OUTPATIENT_CLINIC_OR_DEPARTMENT_OTHER): Payer: Self-pay

## 2024-04-11 ENCOUNTER — Emergency Department (HOSPITAL_BASED_OUTPATIENT_CLINIC_OR_DEPARTMENT_OTHER)
Admission: EM | Admit: 2024-04-11 | Discharge: 2024-04-11 | Disposition: A | Discharge status: Home / Self Care | Attending: Emergency Medicine | Admitting: Emergency Medicine

## 2024-04-11 ENCOUNTER — Other Ambulatory Visit: Payer: Self-pay

## 2024-04-11 ENCOUNTER — Emergency Department (HOSPITAL_BASED_OUTPATIENT_CLINIC_OR_DEPARTMENT_OTHER)

## 2024-04-11 DIAGNOSIS — Z79899 Other long term (current) drug therapy: Secondary | ICD-10-CM | POA: Insufficient documentation

## 2024-04-11 DIAGNOSIS — N183 Chronic kidney disease, stage 3 unspecified: Secondary | ICD-10-CM | POA: Diagnosis not present

## 2024-04-11 DIAGNOSIS — N200 Calculus of kidney: Secondary | ICD-10-CM | POA: Insufficient documentation

## 2024-04-11 DIAGNOSIS — N838 Other noninflammatory disorders of ovary, fallopian tube and broad ligament: Secondary | ICD-10-CM | POA: Diagnosis not present

## 2024-04-11 DIAGNOSIS — D751 Secondary polycythemia: Secondary | ICD-10-CM | POA: Diagnosis not present

## 2024-04-11 DIAGNOSIS — I10 Essential (primary) hypertension: Secondary | ICD-10-CM | POA: Diagnosis not present

## 2024-04-11 DIAGNOSIS — R1903 Right lower quadrant abdominal swelling, mass and lump: Secondary | ICD-10-CM

## 2024-04-11 DIAGNOSIS — K5730 Diverticulosis of large intestine without perforation or abscess without bleeding: Secondary | ICD-10-CM | POA: Diagnosis not present

## 2024-04-11 DIAGNOSIS — D72829 Elevated white blood cell count, unspecified: Secondary | ICD-10-CM | POA: Insufficient documentation

## 2024-04-11 DIAGNOSIS — Z7982 Long term (current) use of aspirin: Secondary | ICD-10-CM | POA: Diagnosis not present

## 2024-04-11 DIAGNOSIS — N390 Urinary tract infection, site not specified: Secondary | ICD-10-CM | POA: Diagnosis not present

## 2024-04-11 DIAGNOSIS — K429 Umbilical hernia without obstruction or gangrene: Secondary | ICD-10-CM | POA: Diagnosis not present

## 2024-04-11 DIAGNOSIS — R1031 Right lower quadrant pain: Secondary | ICD-10-CM | POA: Diagnosis not present

## 2024-04-11 DIAGNOSIS — Z853 Personal history of malignant neoplasm of breast: Secondary | ICD-10-CM | POA: Diagnosis not present

## 2024-04-11 DIAGNOSIS — I509 Heart failure, unspecified: Secondary | ICD-10-CM | POA: Insufficient documentation

## 2024-04-11 DIAGNOSIS — R319 Hematuria, unspecified: Secondary | ICD-10-CM | POA: Diagnosis not present

## 2024-04-11 DIAGNOSIS — K449 Diaphragmatic hernia without obstruction or gangrene: Secondary | ICD-10-CM | POA: Diagnosis not present

## 2024-04-11 DIAGNOSIS — R300 Dysuria: Secondary | ICD-10-CM | POA: Diagnosis present

## 2024-04-11 LAB — COMPREHENSIVE METABOLIC PANEL WITH GFR
ALT: 12 U/L (ref 0–44)
AST: 22 U/L (ref 15–41)
Albumin: 4.8 g/dL (ref 3.5–5.0)
Alkaline Phosphatase: 96 U/L (ref 38–126)
Anion gap: 12 (ref 5–15)
BUN: 17 mg/dL (ref 8–23)
CO2: 27 mmol/L (ref 22–32)
Calcium: 10.6 mg/dL — ABNORMAL HIGH (ref 8.9–10.3)
Chloride: 100 mmol/L (ref 98–111)
Creatinine, Ser: 1.17 mg/dL — ABNORMAL HIGH (ref 0.44–1.00)
GFR, Estimated: 45 mL/min — ABNORMAL LOW (ref >60)
Glucose, Bld: 92 mg/dL (ref 70–99)
Potassium: 4.5 mmol/L (ref 3.5–5.1)
Sodium: 139 mmol/L (ref 135–145)
Total Bilirubin: 0.9 mg/dL (ref 0.0–1.2)
Total Protein: 7.8 g/dL (ref 6.5–8.1)

## 2024-04-11 LAB — CBC WITH DIFFERENTIAL/PLATELET
Abs Immature Granulocytes: 0.09 10*3/uL — ABNORMAL HIGH (ref 0.00–0.07)
Basophils Absolute: 0.1 10*3/uL (ref 0.0–0.1)
Basophils Relative: 1 %
Eosinophils Absolute: 0.3 10*3/uL (ref 0.0–0.5)
Eosinophils Relative: 2 %
HCT: 47.8 % — ABNORMAL HIGH (ref 36.0–46.0)
Hemoglobin: 15.8 g/dL — ABNORMAL HIGH (ref 12.0–15.0)
Immature Granulocytes: 1 %
Lymphocytes Relative: 7 %
Lymphs Abs: 1.4 10*3/uL (ref 0.7–4.0)
MCH: 29.6 pg (ref 26.0–34.0)
MCHC: 33.1 g/dL (ref 30.0–36.0)
MCV: 89.5 fL (ref 80.0–100.0)
Monocytes Absolute: 0.9 10*3/uL (ref 0.1–1.0)
Monocytes Relative: 5 %
Neutro Abs: 16.1 10*3/uL — ABNORMAL HIGH (ref 1.7–7.7)
Neutrophils Relative %: 84 %
Platelets: 338 10*3/uL (ref 150–400)
RBC: 5.34 MIL/uL — ABNORMAL HIGH (ref 3.87–5.11)
RDW: 13 % (ref 11.5–15.5)
WBC: 19 10*3/uL — ABNORMAL HIGH (ref 4.0–10.5)
nRBC: 0 % (ref 0.0–0.2)

## 2024-04-11 LAB — URINALYSIS, W/ REFLEX TO CULTURE (INFECTION SUSPECTED)
Bilirubin Urine: NEGATIVE
Glucose, UA: NEGATIVE mg/dL
Ketones, ur: NEGATIVE mg/dL
Nitrite: NEGATIVE
Protein, ur: NEGATIVE mg/dL
RBC / HPF: >50 RBC/hpf (ref 0–5)
Specific Gravity, Urine: 1.01 (ref 1.005–1.030)
WBC, UA: >50 WBC/hpf (ref 0–5)
pH: 5.5 (ref 5.0–8.0)

## 2024-04-11 LAB — LACTIC ACID, PLASMA: Lactic Acid, Venous: 1.2 mmol/L (ref 0.5–1.9)

## 2024-04-11 MED ORDER — CEFADROXIL 500 MG PO CAPS: 500.0000 mg | ORAL_CAPSULE | Freq: Two times a day (BID) | ORAL | 0 refills | Status: DC

## 2024-04-11 MED ORDER — IOHEXOL 300 MG/ML  SOLN
75.0000 mL | Freq: Once | INTRAMUSCULAR | Status: AC | PRN
  Administered 2024-04-11: 75 mL via INTRAVENOUS

## 2024-04-11 MED ORDER — SODIUM CHLORIDE 0.9 % IV SOLN
2.0000 g | Freq: Once | INTRAVENOUS | Status: AC
  Administered 2024-04-11: 2 g via INTRAVENOUS
  Filled 2024-04-11: qty 20

## 2024-04-11 NOTE — Discharge Instructions (Signed)
 As discussed, your workup today was reassuring.  Your urine did appear infected which is most likely causing symptoms.  CT scan appeared normal from an acute perspective.  There is a area next to your right ovary but it is slowly growing.  I will recommend follow-up with gynecology/primary care in the outpatient setting to have a pelvic ultrasound  To further evaluate this.  Please do not hesitate to return to emergency department if the worrisome signs and symptoms we discussed become apparent.

## 2024-04-11 NOTE — ED Provider Notes (Signed)
 [place] EMERGENCY DEPARTMENT AT [facility] Provider Note   CSN: [id number] Arrival date & time: 04/11/24  1312     Patient presents with: Dysuria   Carla Bennett is a 89 y.o. female.    Dysuria   89 year old female presents emergency department complaints of burning when urinating.  States that she has had symptoms since Friday.  Woke up this morning and noticed blood in her urine prompting visit to the urgent care.  Was told to come to the ER at the urgent care due to left-sided flank pain described.  Patient denies any fevers, chills, nausea, vomiting.  Does report pain mainly when urinating with pain in her lower middle abdomen.  States that she has been having left-sided flank pain for years and does not feel any worse necessarily right now.  States that she feels like she has a UTI.  Past medical history significant for CHF, breast cancer, diverticulosis, mitral regurgitation, CKD 3  Prior to Admission medications   Medication Sig Start Date End Date Taking? Authorizing Provider  aspirin  EC 81 MG tablet Take 1 tablet (81 mg total) by mouth daily. Swallow whole. 08/29/22   Carla Toribio SQUIBB, MD  B Complex Vitamins (VITAMIN B COMPLEX PO) Take 1 tablet by mouth daily.     [provider]  calcium  carbonate (TUMS) 500 MG chewable tablet Chew 2 tablets (400 mg of elemental calcium  total) by mouth 3 times/day as needed-between meals & bedtime for indigestion or heartburn. 11/17/18   [name], Vinay, MD  carvedilol  (COREG ) 25 MG tablet Take 25 mg by mouth 2 (two) times daily with a meal.    [provider]  dicyclomine  (BENTYL ) 10 MG capsule Take 1 capsule (10 mg total) by mouth daily. Office visit for further refills 01/11/24   Carla Norleen SAILOR, MD  furosemide  (LASIX ) 20 MG tablet Take 20 mg by mouth as needed for fluid or edema.    [provider]  hyoscyamine  (LEVSIN ) 0.125 MG tablet TAKE 1-2 TABLET EVERY 4-6 HOURS AS NEEDED FOR ABDOMINAL PAIN  10/19/23   Carla Norleen SAILOR, MD  loratadine (CLARITIN) 10 MG tablet Take 10 mg by mouth daily.    [provider]  olmesartan  (BENICAR ) 40 MG tablet Take 1 tablet (40 mg total) by mouth daily. 07/31/23   Carla Orren SAILOR, PA-C  rosuvastatin  (CRESTOR ) 10 MG tablet Take 1 tablet (10 mg total) by mouth 2 (two) times a week. 02/04/24   Carla Oneil BROCKS, MD  spironolactone  (ALDACTONE ) 25 MG tablet TAKE 1/2 TABLET BY MOUTH DAILY 01/28/24   Carla Oneil BROCKS, MD    Allergies: Statins    Review of Systems  Genitourinary:  Positive for dysuria.  All other systems reviewed and are negative.   Updated Vital Signs BP (!) 151/76 (BP Location: Right Arm)   Pulse 74   Temp 97.7 F (36.5 C)   Resp 18   Ht 4' 11.75 (1.518 m)   Wt 68.9 kg   LMP 10/20/1996   SpO2 97%   BMI 29.93 kg/m   Physical Exam Vitals and nursing note reviewed.  Constitutional:      General: She is not in acute distress.    Appearance: She is well-developed.  HENT:     Head: Normocephalic and atraumatic.   Eyes:     Conjunctiva/sclera: Conjunctivae normal.    Cardiovascular:     Rate and Rhythm: Normal rate and regular rhythm.  Pulmonary:     Effort: Pulmonary effort is  normal. No respiratory distress.     Breath sounds: Normal breath sounds.  Abdominal:     Palpations: Abdomen is soft.     Comments: Tenderness suprapubic region.  Left-sided CVA tenderness.   Musculoskeletal:        General: No swelling.     Cervical back: Neck supple.   Skin:    General: Skin is warm and dry.     Capillary Refill: Capillary refill takes less than 2 seconds.   Neurological:     Mental Status: She is alert.   Psychiatric:        Mood and Affect: Mood normal.     (all labs ordered are listed, but only abnormal results are displayed) Labs Reviewed  URINALYSIS, W/ REFLEX TO CULTURE (INFECTION SUSPECTED) - Abnormal; Notable for the following components:      Result Value   APPearance CLOUDY (*)    Hgb urine dipstick  LARGE (*)    Leukocytes,Ua LARGE (*)    Bacteria, UA FEW (*)    All other components within normal limits  COMPREHENSIVE METABOLIC PANEL WITH GFR - Abnormal; Notable for the following components:   Creatinine, Ser 1.17 (*)    Calcium  10.6 (*)    GFR, Estimated 45 (*)    All other components within normal limits  CBC WITH DIFFERENTIAL/PLATELET - Abnormal; Notable for the following components:   WBC 19.0 (*)    RBC 5.34 (*)    Hemoglobin 15.8 (*)    HCT 47.8 (*)    Neutro Abs 16.1 (*)    Abs Immature Granulocytes 0.09 (*)    All other components within normal limits  URINE CULTURE  CULTURE, BLOOD (ROUTINE X 2)  CULTURE, BLOOD (ROUTINE X 2)  LACTIC ACID, PLASMA  LACTIC ACID, PLASMA    EKG: None  Radiology: No results found.   Procedures   Medications Ordered in the ED  cefTRIAXone (ROCEPHIN) 2 g in sodium chloride  0.9 % 100 mL IVPB (has no administration in time range)                                    Medical Decision Making Amount and/or Complexity of Data Reviewed Labs: ordered. Radiology: ordered.  Risk Prescription drug management.   This patient presents to the ED for concern of dysuria, this involves an extensive number of treatment options, and is a complaint that carries with it a high risk of complications and morbidity.  The differential diagnosis includes cystitis, pyelonephritis, nephrolithiasis, sepsis, other   Co morbidities that complicate the patient evaluation  See HPI   Additional history obtained:  Additional history obtained from EMR External records from outside source obtained and reviewed including hospital records   Lab Tests:  I Ordered, and personally interpreted labs.  The pertinent results include: Leukocytosis of 19.0 with left shift.  Polycythemia hemoglobin of 15.8.  Platelets within normal range.  No electrolyte abnormalities besides hypercalcemia of 10.6.[name]  Patient baseline renal function creatinine 1.17, GFR 45.  UA  with few bacteria, WBC clumps present, WBCs, RBCs greater than 50, large leukocytes.  Lactic acid 1.2.  Blood cultures pending.   Imaging Studies ordered:  I ordered imaging studies including CT abdomen pelvis I independently visualized and interpreted imaging which showed no acute inflammatory process.  2 adjacent 5 to 6 mm nonobstructing calculi right kidney.  Well circumcised oval mass in the right adnexa larger than scan 2017.  Aortic atherosclerosis. I agree with the radiologist interpretation  Cardiac Monitoring: / EKG:  The patient was maintained on a cardiac monitor.  I personally viewed and interpreted the cardiac monitored which showed an underlying rhythm of: Sinus rhythm   Consultations Obtained:  I requested consultation with attending physician who was in agreement with treatment plan going forward.  Problem List / ED Course / Critical interventions / Medication management  UTI, right adnexal mass I ordered medication including Rocephin   Reevaluation of the patient after these medicines showed that the patient stayed the same I have reviewed the patients home medicines and have made adjustments as needed   Social Determinants of Health:  Denies tobacco, licit drug use.   Test / Admission - Considered:  UTI, right adnexal mass Vitals signs significant for hypertension blood pressure 151/76. Otherwise within normal range and stable throughout visit. Laboratory/imaging studies significant for: See above 89 year old female presents emergency department complaints of burning when urinating.  States that she has had symptoms since Friday.  Woke up this morning and noticed blood in her urine prompting visit to the urgent care.  Was told to come to the ER at the urgent care due to left-sided flank pain described.  Patient denies any fevers, chills, nausea, vomiting.  Does report pain mainly when urinating with pain in her lower middle abdomen.  States that she has been  having left-sided flank pain for years and does not feel any worse necessarily right now.  States that she feels like she has a UTI. On exam, minimal suprapubic tenderness with possible left-sided CVA tenderness versus paraspinal MSK tenderness.  Regarding left-sided low back pain, patient states that this has been present for years without any acute change.  Lower suspicion that this is CVA tenderness given no acute change per patient's history.  Workup today concerning for UTI.  UA appears infected.  Patient did have significant white count of 19,000 but with normal lactic acid and normal vital signs; does not meet SIRS criteria.  CT imaging was obtained given possible CVA tenderness which was negative for any acute abnormality.  Shared decision-making conversation was had with patient regarding empiric treatment at home versus admission of which she elected for the former.  This seemed reasonable given patient does not meet SIRS criteria.  Treated with 1 dose of Rocephin while in the ED.  Will culture urine as well as some blood cultures and empirically treat with antibiotics.  Recommend close follow-up with PCP in the outpatient setting.  Incidentally found right-sided adnexal mass show recommend pelvic ultrasound imaging in the outpatient setting.  Treatment plan discussed with patient and she knowledge understanding was agreeable to said plan.  Patient overall well-appearing, afebrile in no acute distress. Worrisome signs and symptoms were discussed with the patient, and the patient acknowledged understanding to return to the ED if noticed. Patient was stable upon discharge.       Final diagnoses:  None    ED Discharge Orders     None          Silver Wonda LABOR, [place] 04/11/24 1615    Tegeler, Lonni PARAS, MD 04/12/24 0830

## 2024-04-11 NOTE — ED Triage Notes (Signed)
 Pt presents with dysuria that started 2 days ago and today she noticed some hematuria. Denies fevers chills. Pt was seen at [facility] and had a urine dip. Pt provided a print out of results that show positive for blood, protein, and leukocytes. Pt was referred to the ED.

## 2024-04-12 LAB — URINE CULTURE: Culture: <10000 — AB

## 2024-04-14 DIAGNOSIS — M545 Low back pain, unspecified: Secondary | ICD-10-CM | POA: Diagnosis not present

## 2024-04-14 DIAGNOSIS — R319 Hematuria, unspecified: Secondary | ICD-10-CM | POA: Diagnosis not present

## 2024-04-14 DIAGNOSIS — M5126 Other intervertebral disc displacement, lumbar region: Secondary | ICD-10-CM | POA: Diagnosis not present

## 2024-04-14 DIAGNOSIS — N3000 Acute cystitis without hematuria: Secondary | ICD-10-CM | POA: Diagnosis not present

## 2024-04-14 DIAGNOSIS — N9489 Other specified conditions associated with female genital organs and menstrual cycle: Secondary | ICD-10-CM | POA: Diagnosis not present

## 2024-04-14 DIAGNOSIS — N952 Postmenopausal atrophic vaginitis: Secondary | ICD-10-CM | POA: Diagnosis not present

## 2024-04-14 DIAGNOSIS — I10 Essential (primary) hypertension: Secondary | ICD-10-CM | POA: Diagnosis not present

## 2024-04-14 LAB — CULTURE, BLOOD (ROUTINE X 2)

## 2024-04-16 LAB — CULTURE, BLOOD (ROUTINE X 2)
Culture: NO GROWTH
Special Requests: ADEQUATE

## 2024-04-26 ENCOUNTER — Inpatient Hospital Stay: Admission: RE | Admit: 2024-04-26 | Source: Ambulatory Visit

## 2024-04-26 ENCOUNTER — Other Ambulatory Visit: Payer: Self-pay | Admitting: Family Medicine

## 2024-04-26 DIAGNOSIS — N9489 Other specified conditions associated with female genital organs and menstrual cycle: Secondary | ICD-10-CM

## 2024-04-27 ENCOUNTER — Inpatient Hospital Stay
Admission: RE | Admit: 2024-04-27 | Discharge: 2024-04-27 | Discharge status: Home / Self Care | Source: Ambulatory Visit | Attending: Family Medicine | Admitting: Family Medicine

## 2024-04-27 DIAGNOSIS — Z90710 Acquired absence of both cervix and uterus: Secondary | ICD-10-CM | POA: Diagnosis not present

## 2024-04-27 DIAGNOSIS — N83291 Other ovarian cyst, right side: Secondary | ICD-10-CM | POA: Diagnosis not present

## 2024-04-27 DIAGNOSIS — N9489 Other specified conditions associated with female genital organs and menstrual cycle: Secondary | ICD-10-CM

## 2024-05-01 NOTE — Progress Notes (Signed)
 Chief Complaint: No chief complaint on file.   History of Present Illness:  Carla Bennett is a 89 y.o. female who is seen in consultation from Carla Almarie SAUNDERS, MD for evaluation of ***.   Past Medical History:  Past Medical History:  Diagnosis Date   Allergy    Aortic regurgitation    Arthritis    hands, neck   Basal cell carcinoma    Benign neoplasm of colon    Breast cancer of upper-outer quadrant of left female breast (HCC) 02/23/2015   4'16-left breast cancer-surgery, chemo, radiation-[name] follows every 3 months   Cataract    beginning stages-monitoring by MD   Cholelithiasis    resolved with surgery   Chronic systolic CHF (congestive heart failure) (HCC) 02/26/2016   A.  Echo 02/21/16:  Mild LVH, EF 20-25%, diff HK, Gr 3 DD, mod AI, mod to severe MR, mod LAE, mod redcued RVSF, mild RAE, mod TR, PASP 53 mmHg, small pericardial effusion, mod L pleural effusion  //  B. Echo 6/17:  Mild LVH EF 30-35%, diffuse HK, grade 1 diastolic dysfunction, trivial AI, MAC, mild LAE, normal RVSF, trivial pericardial effusion     Cystocele    Diverticulitis    Diverticulosis of colon (without mention of hemorrhage)    Gout    resolved -   Hearing loss    some hearing loss in right ear- no hearing aids   Heart murmur    Hematuria    negative urology work up   History of blood transfusion 1971   x 1 with D&C -    Hx of abnormal cervical Pap smear    LEEP-CIN I, negative margins and ECC   Hypercalcemia    Hypertension    Mitral regurgitation    Neuromuscular disorder (HCC)    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   OA (osteoarthritis) of hip    right   Other issue of medical certificates    uterine myomata   Parathyroid  abnormality (HCC)    being followed Dr. DOROTHA Bennett   Radiation 10/01/15-11/16/15   left breast axillary and supraclavicular reigion 45 gray, left breast 50.4 gray, lumpectomy cavity boost 10 gray   Renal disease 3/16   Stage 3 kidney disease-seeing Dr Bennett  and Dr Rayburn   Uterine prolaps    resolved after surgery   Vitamin D  deficiency     Past Surgical History:  Past Surgical History:  Procedure Laterality Date   BREAST LUMPECTOMY Left 08/2015   CHOLECYSTECTOMY     COLONOSCOPY  01/18/2013   [name]   COLONOSCOPY N/A 06/26/2016   Procedure: COLONOSCOPY;  Surgeon: Norleen LOISE Kiang, MD;  Location: [facility];  Service: Endoscopy;  Laterality: N/A;   CYSTOCELE REPAIR  08/2010   Rectocele, vault prolapse   DILATION AND CURETTAGE OF UTERUS     x2   PORT-A-CATH REMOVAL Right 08/23/2015   Procedure: REMOVAL PORT-A-CATH;  Surgeon: Debby Shipper, MD;  Location: [facility];  Service: General;  Laterality: Right;   PORTACATH PLACEMENT Right 03/20/2015   Procedure: INSERTION PORT-A-CATH;  Surgeon: Debby Shipper, MD;  Location: [facility];  Service: General;  Laterality: Right;   RADIOACTIVE SEED GUIDED PARTIAL MASTECTOMY WITH AXILLARY SENTINEL LYMPH NODE BIOPSY Left 08/23/2015   Procedure: LEFT BREAST PARTIAL MASTECTOMY WITH SENTINEL LYMPH NODE MAPPING;  Surgeon: Debby Shipper, MD;  Location: [facility];  Service: General;  Laterality: Left;   TONSILLECTOMY AND ADENOIDECTOMY     TOTAL  VAGINAL HYSTERECTOMY     secondary to prolapse, ovaries not removed    Allergies:  Allergies  Allergen Reactions   Statins     Muscle aches    Family History:  Family History  Problem Relation Age of Onset   Parkinson's disease Mother    Dementia Mother    Lung cancer Father 52   Breast cancer Sister 8   Multiple sclerosis Sister    Prostate cancer Maternal Uncle 68   Stomach cancer Maternal Uncle    Cancer Maternal Uncle 42       GI cancer - ? stomach   Cancer Cousin 36       mat female first cousin (son of mat uncle with GI cancer) with ureter cancer   Colon cancer Neg Hx    Esophageal cancer Neg Hx    Rectal cancer Neg Hx    Colon polyps Neg Hx    Sleep apnea Neg Hx     Social History:  Social History   Tobacco  Use   Smoking status: Never   Smokeless tobacco: Never  Vaping Use   Vaping status: Never Used  Substance Use Topics   Alcohol  use: Yes    Comment: occasionally  wine/beer   Drug use: No    Review of symptoms:  Constitutional:  Negative for unexplained weight loss, night sweats, fever, chills ENT:  Negative for nose bleeds, sinus pain, painful swallowing CV:  Negative for chest pain, shortness of breath, exercise intolerance, palpitations, loss of consciousness Resp:  Negative for cough, wheezing, shortness of breath GI:  Negative for nausea, vomiting, diarrhea, bloody stools GU:  Positives noted in HPI; otherwise negative for gross hematuria, dysuria, urinary incontinence Neuro:  Negative for seizures, poor balance, limb weakness, slurred speech Psych:  Negative for lack of energy, depression, anxiety Endocrine:  Negative for polydipsia, polyuria, symptoms of hypoglycemia (dizziness, hunger, sweating) Hematologic:  Negative for anemia, purpura, petechia, prolonged or excessive bleeding, use of anticoagulants  Allergic:  Negative for difficulty breathing or choking as a result of exposure to anything; no shellfish allergy; no allergic response (rash/itch) to materials, foods  Physical exam: LMP 10/20/1996  GENERAL APPEARANCE:  Well appearing, well developed, well nourished, NAD HEENT: Atraumatic, Normocephalic. NECK: Normal appearance LUNGS: Normal inspiratory and expiratory excursion HEART: Regular Rate ABDOMEN: *** EXTREMITIES: Moves all extremities well.  Without clubbing, cyanosis, or edema. NEUROLOGIC:  Alert and oriented x 3, normal gait, CN II-XII grossly intact.  MENTAL STATUS:  Appropriate. SKIN:  Warm, dry and intact.    Results: No results found for this or any previous visit (from the past 24 hours).  I have reviewed prior patient's records  I have reviewed referring/prior physicians records  I have reviewed urinalysis  I have reviewed prior urine  cultures  I reviewed prior imaging studies  Assessment: No diagnosis found.   Plan: ***

## 2024-05-02 ENCOUNTER — Ambulatory Visit (INDEPENDENT_AMBULATORY_CARE_PROVIDER_SITE_OTHER): Admitting: Urology

## 2024-05-02 VITALS — BP 157/76 | HR 69 | Ht 59.0 in | Wt 150.0 lb

## 2024-05-02 DIAGNOSIS — R300 Dysuria: Secondary | ICD-10-CM

## 2024-05-02 DIAGNOSIS — R319 Hematuria, unspecified: Secondary | ICD-10-CM

## 2024-05-02 LAB — URINALYSIS, ROUTINE W REFLEX MICROSCOPIC
Bilirubin, UA: NEGATIVE
Glucose, UA: NEGATIVE
Ketones, UA: NEGATIVE
Nitrite, UA: NEGATIVE
Protein,UA: NEGATIVE
Specific Gravity, UA: 1.01 (ref 1.005–1.030)
Urobilinogen, Ur: 0.2 mg/dL (ref 0.2–1.0)
pH, UA: 5.5 (ref 5.0–7.5)

## 2024-05-02 LAB — MICROSCOPIC EXAMINATION

## 2024-05-04 ENCOUNTER — Other Ambulatory Visit: Payer: Self-pay | Admitting: Family Medicine

## 2024-05-04 DIAGNOSIS — N838 Other noninflammatory disorders of ovary, fallopian tube and broad ligament: Secondary | ICD-10-CM

## 2024-05-05 DIAGNOSIS — N1831 Chronic kidney disease, stage 3a: Secondary | ICD-10-CM | POA: Diagnosis not present

## 2024-05-05 DIAGNOSIS — I129 Hypertensive chronic kidney disease with stage 1 through stage 4 chronic kidney disease, or unspecified chronic kidney disease: Secondary | ICD-10-CM | POA: Diagnosis not present

## 2024-05-05 DIAGNOSIS — I429 Cardiomyopathy, unspecified: Secondary | ICD-10-CM | POA: Diagnosis not present

## 2024-05-11 ENCOUNTER — Ambulatory Visit (HOSPITAL_BASED_OUTPATIENT_CLINIC_OR_DEPARTMENT_OTHER)
Admission: RE | Admit: 2024-05-11 | Discharge: 2024-05-11 | Disposition: A | Discharge status: Home / Self Care | Source: Ambulatory Visit | Attending: Family Medicine | Admitting: Family Medicine

## 2024-05-11 DIAGNOSIS — N83291 Other ovarian cyst, right side: Secondary | ICD-10-CM | POA: Diagnosis not present

## 2024-05-11 DIAGNOSIS — I700 Atherosclerosis of aorta: Secondary | ICD-10-CM | POA: Diagnosis not present

## 2024-05-11 DIAGNOSIS — N838 Other noninflammatory disorders of ovary, fallopian tube and broad ligament: Secondary | ICD-10-CM | POA: Diagnosis not present

## 2024-05-11 DIAGNOSIS — Z90710 Acquired absence of both cervix and uterus: Secondary | ICD-10-CM | POA: Diagnosis not present

## 2024-05-11 DIAGNOSIS — K5790 Diverticulosis of intestine, part unspecified, without perforation or abscess without bleeding: Secondary | ICD-10-CM | POA: Diagnosis not present

## 2024-05-11 MED ORDER — GADOBUTROL 1 MMOL/ML IV SOLN
6.8000 mL | Freq: Once | INTRAVENOUS | Status: AC | PRN
  Administered 2024-05-11: 6.8 mL via INTRAVENOUS

## 2024-05-12 DIAGNOSIS — K5790 Diverticulosis of intestine, part unspecified, without perforation or abscess without bleeding: Secondary | ICD-10-CM | POA: Diagnosis not present

## 2024-05-12 DIAGNOSIS — N838 Other noninflammatory disorders of ovary, fallopian tube and broad ligament: Secondary | ICD-10-CM | POA: Diagnosis not present

## 2024-05-12 DIAGNOSIS — I10 Essential (primary) hypertension: Secondary | ICD-10-CM | POA: Diagnosis not present

## 2024-05-12 DIAGNOSIS — R319 Hematuria, unspecified: Secondary | ICD-10-CM | POA: Diagnosis not present

## 2024-05-12 DIAGNOSIS — R7989 Other specified abnormal findings of blood chemistry: Secondary | ICD-10-CM | POA: Diagnosis not present

## 2024-05-12 DIAGNOSIS — J309 Allergic rhinitis, unspecified: Secondary | ICD-10-CM | POA: Diagnosis not present

## 2024-05-13 ENCOUNTER — Other Ambulatory Visit: Payer: Self-pay

## 2024-05-13 ENCOUNTER — Emergency Department (HOSPITAL_BASED_OUTPATIENT_CLINIC_OR_DEPARTMENT_OTHER)
Admission: EM | Admit: 2024-05-13 | Discharge: 2024-05-13 | Disposition: A | Discharge status: Home / Self Care | Source: Ambulatory Visit | Attending: Emergency Medicine | Admitting: Emergency Medicine

## 2024-05-13 ENCOUNTER — Encounter (HOSPITAL_BASED_OUTPATIENT_CLINIC_OR_DEPARTMENT_OTHER): Payer: Self-pay | Admitting: Emergency Medicine

## 2024-05-13 DIAGNOSIS — R109 Unspecified abdominal pain: Secondary | ICD-10-CM | POA: Diagnosis present

## 2024-05-13 DIAGNOSIS — Z7982 Long term (current) use of aspirin: Secondary | ICD-10-CM | POA: Insufficient documentation

## 2024-05-13 DIAGNOSIS — I509 Heart failure, unspecified: Secondary | ICD-10-CM | POA: Diagnosis not present

## 2024-05-13 DIAGNOSIS — Z79899 Other long term (current) drug therapy: Secondary | ICD-10-CM | POA: Insufficient documentation

## 2024-05-13 DIAGNOSIS — Z853 Personal history of malignant neoplasm of breast: Secondary | ICD-10-CM | POA: Insufficient documentation

## 2024-05-13 DIAGNOSIS — I493 Ventricular premature depolarization: Secondary | ICD-10-CM | POA: Diagnosis not present

## 2024-05-13 DIAGNOSIS — I110 Hypertensive heart disease with heart failure: Secondary | ICD-10-CM | POA: Diagnosis not present

## 2024-05-13 DIAGNOSIS — R002 Palpitations: Secondary | ICD-10-CM | POA: Diagnosis not present

## 2024-05-13 DIAGNOSIS — I491 Atrial premature depolarization: Secondary | ICD-10-CM | POA: Diagnosis not present

## 2024-05-13 DIAGNOSIS — T3695XA Adverse effect of unspecified systemic antibiotic, initial encounter: Secondary | ICD-10-CM | POA: Diagnosis not present

## 2024-05-13 LAB — CBC
HCT: 43.5 % (ref 36.0–46.0)
Hemoglobin: 14.3 g/dL (ref 12.0–15.0)
MCH: 29.4 pg (ref 26.0–34.0)
MCHC: 32.9 g/dL (ref 30.0–36.0)
MCV: 89.5 fL (ref 80.0–100.0)
Platelets: 352 K/uL (ref 150–400)
RBC: 4.86 MIL/uL (ref 3.87–5.11)
RDW: 13 % (ref 11.5–15.5)
WBC: 11.8 K/uL — ABNORMAL HIGH (ref 4.0–10.5)
nRBC: 0 % (ref 0.0–0.2)

## 2024-05-13 LAB — COMPREHENSIVE METABOLIC PANEL WITH GFR
ALT: 12 U/L (ref 0–44)
AST: 22 U/L (ref 15–41)
Albumin: 4.4 g/dL (ref 3.5–5.0)
Alkaline Phosphatase: 81 U/L (ref 38–126)
Anion gap: 15 (ref 5–15)
BUN: 19 mg/dL (ref 8–23)
CO2: 21 mmol/L — ABNORMAL LOW (ref 22–32)
Calcium: 9.9 mg/dL (ref 8.9–10.3)
Chloride: 104 mmol/L (ref 98–111)
Creatinine, Ser: 1.18 mg/dL — ABNORMAL HIGH (ref 0.44–1.00)
GFR, Estimated: 44 mL/min — ABNORMAL LOW (ref >60)
Glucose, Bld: 109 mg/dL — ABNORMAL HIGH (ref 70–99)
Potassium: 4.3 mmol/L (ref 3.5–5.1)
Sodium: 140 mmol/L (ref 135–145)
Total Bilirubin: 0.7 mg/dL (ref 0.0–1.2)
Total Protein: 7.2 g/dL (ref 6.5–8.1)

## 2024-05-13 LAB — URINALYSIS, ROUTINE W REFLEX MICROSCOPIC
Bilirubin Urine: NEGATIVE
Glucose, UA: NEGATIVE mg/dL
Ketones, ur: NEGATIVE mg/dL
Leukocytes,Ua: NEGATIVE
Nitrite: NEGATIVE
Protein, ur: NEGATIVE mg/dL
Specific Gravity, Urine: 1.01 (ref 1.005–1.030)
pH: 6 (ref 5.0–8.0)

## 2024-05-13 LAB — URINALYSIS, MICROSCOPIC (REFLEX): Bacteria, UA: NONE SEEN

## 2024-05-13 LAB — TROPONIN T, HIGH SENSITIVITY: Troponin T High Sensitivity: 16 ng/L (ref <19)

## 2024-05-13 NOTE — ED Triage Notes (Signed)
 Pt POV steady gait- reports starting taking 2 antibiotics last night, 2 doses total for colon infection.  Now c/o weakness, disorientation, irregular breathing since this AM.   Pts breathing regular, unlabored. AOx4.

## 2024-05-13 NOTE — ED Provider Notes (Signed)
 [facility] Provider Note   CSN: [id number] Arrival date & time: 05/13/24  1416     Patient presents with: Medication Reaction   Jori CROME Curvin is a 89 y.o. female.   Patient is an 89 year old female with a history of hypertension, diverticulitis, cholelithiasis, chronic CHF with reduced EF, mitral and aortic regurgitation and prior breast cancer no longer requiring treatment who is presenting today with concern for possible reaction to medications.  Patient reports that on Wednesday she had significant abdominal pain and some diarrhea.  This does happen on occasion as well as the abdominal cramping and she usually takes hyoscyamine  as needed and daily Bentyl .  She saw her doctor on Thursday and at that time because of some abnormalities on an MRI she had to evaluate a cyst in her pelvis her doctor started her on Cipro and Flagyl  due to concern for possible diverticulitis given her symptoms that she experienced on Wednesday.  She reports however her abdominal pain has resolved and her bowel movements have returned to more of her normal.  She denies any urinary symptoms at this time.  However she took a dose of the medications last night around 11 PM and then after taking the second dose this morning at 11 AM she started noticing that her heart felt funny she felt like she was not breathing quite right and felt like her memory was foggy.  She denies any swelling in her throat or chest.  No nausea or vomiting.  She still has no abdominal pain.  She is not sure if she is ever taken any of these medications before.  She tried calling her doctor's office but her doctor does not work on Fridays and they told her to come to the emergency room because there was no one she could talk to over the phone.  She reports that she still feels a little bit off but is not feeling any worse.  Her biggest concern was she lives alone and her son and daughter-in-law are out of  town currently and she did not want something to happen when she was at home alone.  She does not have any particular chest pain and denies any rash or itching.  She feels generally worn out.  The history is provided by the patient and medical records.       Prior to Admission medications   Medication Sig Start Date End Date Taking? Authorizing Provider  aspirin  EC 81 MG tablet Take 1 tablet (81 mg total) by mouth daily. Swallow whole. 08/29/22   Jadine Toribio SQUIBB, MD  B Complex Vitamins (VITAMIN B COMPLEX PO) Take 1 tablet by mouth daily.     [provider]  calcium  carbonate (TUMS) 500 MG chewable tablet Chew 2 tablets (400 mg of elemental calcium  total) by mouth 3 times/day as needed-between meals & bedtime for indigestion or heartburn. 11/17/18   [name], Vinay, MD  carvedilol  (COREG ) 25 MG tablet Take 25 mg by mouth 2 (two) times daily with a meal.    [provider]  cefadroxil  (DURICEF) 500 MG capsule Take 1 capsule (500 mg total) by mouth 2 (two) times daily. 04/12/24   Silver Wonda LABOR, PA  dicyclomine  (BENTYL ) 10 MG capsule Take 1 capsule (10 mg total) by mouth daily. Office visit for further refills 01/11/24   Abran Norleen SAILOR, MD  furosemide  (LASIX ) 20 MG tablet Take 20 mg by mouth as needed for fluid or edema.  [provider]  hyoscyamine  (LEVSIN ) 0.125 MG tablet TAKE 1-2 TABLET EVERY 4-6 HOURS AS NEEDED FOR ABDOMINAL PAIN 10/19/23   Abran Norleen SAILOR, MD  lisinopril  (ZESTRIL ) 40 MG tablet Take 40 mg by mouth daily. 11/09/23   [provider]  loratadine (CLARITIN) 10 MG tablet Take 10 mg by mouth daily.    [provider]  olmesartan  (BENICAR ) 40 MG tablet Take 1 tablet (40 mg total) by mouth daily. 07/31/23   Lucien Orren SAILOR, PA-C  rosuvastatin  (CRESTOR ) 10 MG tablet Take 1 tablet (10 mg total) by mouth 2 (two) times a week. 02/04/24   Jeffrie Oneil BROCKS, MD  spironolactone  (ALDACTONE ) 25 MG tablet TAKE 1/2 TABLET BY MOUTH DAILY 01/28/24   Jeffrie Oneil BROCKS, MD    Allergies: Statins    Review of Systems  Updated Vital Signs BP (!) 164/88   Pulse 68   Temp 98.1 F (36.7 C) (Oral)   Resp 19   Ht 4' 11 (1.499 m)   Wt 68.2 kg   LMP 10/20/1996   SpO2 98%   BMI 30.38 kg/m   Physical Exam Vitals and nursing note reviewed.  Constitutional:      General: She is not in acute distress.    Appearance: She is well-developed.  HENT:     Head: Normocephalic and atraumatic.     Mouth/Throat:     Mouth: Mucous membranes are moist.  Eyes:     Pupils: Pupils are equal, round, and reactive to light.  Cardiovascular:     Rate and Rhythm: Normal rate and regular rhythm. Frequent Extrasystoles are present.    Pulses: Normal pulses.     Heart sounds: Normal heart sounds. No murmur heard.    No friction rub.  Pulmonary:     Effort: Pulmonary effort is normal.     Breath sounds: Normal breath sounds. No wheezing or rales.  Abdominal:     General: Bowel sounds are normal. There is no distension.     Palpations: Abdomen is soft.     Tenderness: There is no abdominal tenderness. There is no guarding or rebound.  Musculoskeletal:        General: No tenderness. Normal range of motion.     Right lower leg: No edema.     Left lower leg: No edema.     Comments: No edema  Skin:    General: Skin is warm and dry.     Findings: No rash.  Neurological:     Mental Status: She is alert and oriented to person, place, and time. Mental status is at baseline.     Cranial Nerves: No cranial nerve deficit.     Sensory: No sensory deficit.     Motor: No weakness.     Gait: Gait normal.  Psychiatric:        Behavior: Behavior normal.     (all labs ordered are listed, but only abnormal results are displayed) Labs Reviewed  COMPREHENSIVE METABOLIC PANEL WITH GFR - Abnormal; Notable for the following components:      Result Value   CO2 21 (*)    Glucose, Bld 109 (*)    Creatinine, Ser 1.18 (*)    GFR, Estimated 44 (*)    All other components  within normal limits  CBC - Abnormal; Notable for the following components:   WBC 11.8 (*)    All other components within normal limits  URINALYSIS, ROUTINE W REFLEX MICROSCOPIC - Abnormal; Notable for the following components:  Hgb urine dipstick TRACE (*)    All other components within normal limits  URINALYSIS, MICROSCOPIC (REFLEX)  TROPONIN T, HIGH SENSITIVITY    EKG: EKG Interpretation Date/Time:  Friday May 13 2024 14:36:12 EDT Ventricular Rate:  68 PR Interval:  167 QRS Duration:  79 QT Interval:  395 QTC Calculation: 421 R Axis:   -31  Text Interpretation: Sinus rhythm Atrial premature complex Left axis deviation Low voltage, precordial leads No significant change since last tracing Confirmed by Doretha Folks ([id number]) on 05/13/2024 3:31:58 PM  Radiology: No results found.   Procedures   Medications Ordered in the ED - No data to display                                  Medical Decision Making Amount and/or Complexity of Data Reviewed Labs: ordered. Decision-making details documented in ED Course. ECG/medicine tests: ordered and independent interpretation performed. Decision-making details documented in ED Course.   Pt with multiple medical problems and comorbidities and presenting today with a complaint that caries a high risk for morbidity and mortality.  Here today with the above complaint.  Concern for possible reaction to either the Cipro or Flagyl , dysrhythmia, electrolyte abnormality.  Low suspicion for an acute abdominal infection as patient has no abdominal pain at this time and looking at her MRI there was diagnosis of diverticulosis but no signs of diverticulitis that were discussed in radiology read.  Low suspicion for anaphylaxis.  Will place patient on the monitor.  On exam she does have frequent extra beats.  I independently interpreted patient's continuous cardiac monitoring which shows a sinus rhythm with frequent PACs.  I independently interpreted  patient's labs and EKG which showed PACs but no other acute findings.  QTc was normal length at 418.  CBC with mild leukocytosis of 11 but improved from prior with normal hemoglobin and platelet count, UA, CMP, troponin within normal limits.   On repeat evaluation after several hours of observation patient reports feeling much better.  Heart rate remains in the 60s and 70s with still frequent PACs.  At this time we will discontinue the antibiotic as there is no sign of obvious diverticulitis at this time based on exam and nothing suggestive on MRI earlier this week.  This was all discussed with the patient.  She is comfortable going home.  She was given return precautions.      Final diagnoses:  Palpitations  PAC (premature atrial contraction)  Adverse reaction to antibiotic    ED Discharge Orders     None          Doretha Folks, MD 05/13/24 1725

## 2024-05-13 NOTE — Discharge Instructions (Signed)
 The only abnormality today was that you had extra beats of your heart on the EKG but no sign of any heart damage, kidneys and electrolytes were normal.  Stop all antibiotics.  Make sure you are eating and drinking regularly.  If you start having chest pain, shortness of breath, severe abdominal pain, frequent diarrhea or feelings like you might pass out return to the emergency room.

## 2024-05-28 ENCOUNTER — Other Ambulatory Visit: Payer: Self-pay | Admitting: Internal Medicine

## 2024-05-30 DIAGNOSIS — Z1231 Encounter for screening mammogram for malignant neoplasm of breast: Secondary | ICD-10-CM | POA: Diagnosis not present

## 2024-06-10 ENCOUNTER — Telehealth: Payer: Self-pay | Admitting: Internal Medicine

## 2024-06-10 MED ORDER — DICYCLOMINE HCL 10 MG PO CAPS
10.0000 mg | ORAL_CAPSULE | Freq: Every day | ORAL | 0 refills | Status: DC
Start: 2024-06-10 — End: 2024-07-04

## 2024-06-10 NOTE — Telephone Encounter (Signed)
 Rx for #30 tablets of Bentyl  sent to pharmacy. Patient must keep 06/2024 appointment for any additional refills. Last office visit 04/2022 at which time she was to have a 6 month follow up.

## 2024-06-10 NOTE — Telephone Encounter (Signed)
 Inbound call from patient stating she has an upcoming appt with us  on 9/30 to discuss medication refill but would like to know if she can be sent the prescription for medication Bentyl  since she only has 8 pills left   Requesting a call back   Please advise  Thank you

## 2024-07-03 ENCOUNTER — Other Ambulatory Visit: Payer: Self-pay | Admitting: Internal Medicine

## 2024-07-11 DIAGNOSIS — L820 Inflamed seborrheic keratosis: Secondary | ICD-10-CM | POA: Diagnosis not present

## 2024-07-19 ENCOUNTER — Ambulatory Visit: Admitting: Nurse Practitioner

## 2024-07-31 ENCOUNTER — Other Ambulatory Visit: Payer: Self-pay | Admitting: Cardiology

## 2024-08-06 DIAGNOSIS — Z23 Encounter for immunization: Secondary | ICD-10-CM | POA: Diagnosis not present

## 2024-08-28 ENCOUNTER — Other Ambulatory Visit: Payer: Self-pay | Admitting: Cardiology

## 2024-08-31 DIAGNOSIS — I129 Hypertensive chronic kidney disease with stage 1 through stage 4 chronic kidney disease, or unspecified chronic kidney disease: Secondary | ICD-10-CM | POA: Diagnosis not present

## 2024-08-31 DIAGNOSIS — I429 Cardiomyopathy, unspecified: Secondary | ICD-10-CM | POA: Diagnosis not present

## 2024-08-31 DIAGNOSIS — N1831 Chronic kidney disease, stage 3a: Secondary | ICD-10-CM | POA: Diagnosis not present

## 2024-09-07 ENCOUNTER — Encounter: Payer: Self-pay | Admitting: Gastroenterology

## 2024-09-07 ENCOUNTER — Ambulatory Visit (INDEPENDENT_AMBULATORY_CARE_PROVIDER_SITE_OTHER): Admitting: Gastroenterology

## 2024-09-07 VITALS — BP 110/70 | HR 70 | Ht 59.0 in | Wt 150.0 lb

## 2024-09-07 DIAGNOSIS — K580 Irritable bowel syndrome with diarrhea: Secondary | ICD-10-CM

## 2024-09-07 DIAGNOSIS — Z1211 Encounter for screening for malignant neoplasm of colon: Secondary | ICD-10-CM

## 2024-09-07 MED ORDER — HYOSCYAMINE SULFATE 0.125 MG PO TBDP: 0.1250 mg | ORAL_TABLET | Freq: Four times a day (QID) | ORAL | 11 refills | Status: AC | PRN

## 2024-09-07 MED ORDER — DICYCLOMINE HCL 10 MG PO CAPS: 10.0000 mg | ORAL_CAPSULE | Freq: Every day | ORAL | 3 refills | Status: AC

## 2024-09-07 NOTE — Progress Notes (Signed)
 Noted

## 2024-09-07 NOTE — Progress Notes (Signed)
 Chief Complaint: Medication refill Primary GI MD: Dr. Abran  HPI: Discussed the use of AI scribe software for clinical note transcription with the patient, who gave verbal consent to proceed.  Carla Bennett Carla Bennett is an 89 year old female with IBS who presents for a medication refill.  She has a history of irritable bowel syndrome (IBS) with diarrhea, managed with Bentyl , Levsin , and Metamucil. These medications have generally been effective, reducing the frequency of severe episodes from several times a month to months apart.  Last week, she experienced a severe episode described as a 'colon spasm' with significant pain lasting about six hours, longer than usual. During this episode, she took two doses of hyoscyamine  without relief and subsequently vomited. She suspects that taking a third dose may have contributed to her nausea and vomiting. Post-episode, she experienced bowel activation with eating or drinking, but symptoms have since normalized.  She attributes the recent episode to stress related to a close friend's situation in which her friend was contemplating suicide. No dietary changes that could have triggered the episode, maintaining her usual diet.  She experiences side effects from her medications, such as dry mouth and dry eyes, but finds them manageable.  She has a history of diverticulosis and hemorrhoids, as noted in her last colonoscopy, which showed no polyps. She is currently taking Metamucil instead of Citrucel, as the latter is no longer available.  She is actively involved in the care of a close friend, which has been a source of stress.   Past Medical History:  Diagnosis Date   Allergy    Aortic regurgitation    Arthritis    hands, neck   Basal cell carcinoma    Benign neoplasm of colon    Breast cancer of upper-outer quadrant of left female breast (HCC) 02/23/2015   4'16-left breast cancer-surgery, chemo, radiation-[name] follows every 3 months    Cataract    beginning stages-monitoring by MD   Cholelithiasis    resolved with surgery   Chronic systolic CHF (congestive heart failure) (HCC) 02/26/2016   A.  Echo 02/21/16:  Mild LVH, EF 20-25%, diff HK, Gr 3 DD, mod AI, mod to severe MR, mod LAE, mod redcued RVSF, mild RAE, mod TR, PASP 53 mmHg, small pericardial effusion, mod L pleural effusion  //  B. Echo 6/17:  Mild LVH EF 30-35%, diffuse HK, grade 1 diastolic dysfunction, trivial AI, MAC, mild LAE, normal RVSF, trivial pericardial effusion     Cystocele    Diverticulitis    Diverticulosis of colon (without mention of hemorrhage)    Gout    resolved -   Hearing loss    some hearing loss in right ear- no hearing aids   Heart murmur    Hematuria    negative urology work up   History of blood transfusion 1971   x 1 with D&C -    Hx of abnormal cervical Pap smear    LEEP-CIN I, negative margins and ECC   Hypercalcemia    Hypertension    Mitral regurgitation    Neuromuscular disorder (HCC)    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   OA (osteoarthritis) of hip    right   Other issue of medical certificates    uterine myomata   Parathyroid  abnormality    being followed Dr. DOROTHA Alexander   Radiation 10/01/15-11/16/15   left breast axillary and supraclavicular reigion 45 gray, left breast 50.4 gray, lumpectomy cavity boost 10 gray   Renal  disease 3/16   Stage 3 kidney disease-seeing Dr Faythe and Dr Rayburn   Uterine prolaps    resolved after surgery   Vitamin D  deficiency     Past Surgical History:  Procedure Laterality Date   BREAST LUMPECTOMY Left 08/2015   CHOLECYSTECTOMY     COLONOSCOPY  01/18/2013   [name]   COLONOSCOPY N/A 06/26/2016   Procedure: COLONOSCOPY;  Surgeon: Norleen LOISE Kiang, MD;  Location: [facility];  Service: Endoscopy;  Laterality: N/A;   CYSTOCELE REPAIR  08/2010   Rectocele, vault prolapse   DILATION AND CURETTAGE OF UTERUS     x2   PORT-A-CATH REMOVAL Right 08/23/2015   Procedure: REMOVAL PORT-A-CATH;   Surgeon: Debby Shipper, MD;  Location: [facility];  Service: General;  Laterality: Right;   PORTACATH PLACEMENT Right 03/20/2015   Procedure: INSERTION PORT-A-CATH;  Surgeon: Debby Shipper, MD;  Location: [facility];  Service: General;  Laterality: Right;   RADIOACTIVE SEED GUIDED PARTIAL MASTECTOMY WITH AXILLARY SENTINEL LYMPH NODE BIOPSY Left 08/23/2015   Procedure: LEFT BREAST PARTIAL MASTECTOMY WITH SENTINEL LYMPH NODE MAPPING;  Surgeon: Debby Shipper, MD;  Location: [facility];  Service: General;  Laterality: Left;   TONSILLECTOMY AND ADENOIDECTOMY     TOTAL VAGINAL HYSTERECTOMY     secondary to prolapse, ovaries not removed    Current Outpatient Medications  Medication Sig Dispense Refill   aspirin  EC 81 MG tablet Take 1 tablet (81 mg total) by mouth daily. Swallow whole.     B Complex Vitamins (VITAMIN B COMPLEX PO) Take 1 tablet by mouth daily.      calcium  carbonate (TUMS) 500 MG chewable tablet Chew 2 tablets (400 mg of elemental calcium  total) by mouth 3 times/day as needed-between meals & bedtime for indigestion or heartburn.     carvedilol  (COREG ) 25 MG tablet Take 25 mg by mouth 2 (two) times daily with a meal.     furosemide  (LASIX ) 20 MG tablet Take 20 mg by mouth as needed for fluid or edema.     lisinopril  (ZESTRIL ) 40 MG tablet Take 40 mg by mouth daily.     loratadine (CLARITIN) 10 MG tablet Take 10 mg by mouth daily.     rosuvastatin  (CRESTOR ) 10 MG tablet Take 1 tablet (10 mg total) by mouth 2 (two) times a week. 25 tablet 1   spironolactone  (ALDACTONE ) 25 MG tablet TAKE 1/2 TABLET BY MOUTH DAILY 7 tablet 0   cefadroxil  (DURICEF) 500 MG capsule Take 1 capsule (500 mg total) by mouth 2 (two) times daily. 14 capsule 0   dicyclomine  (BENTYL ) 10 MG capsule Take 1 capsule (10 mg total) by mouth daily. 90 capsule 3   hyoscyamine  (ANASPAZ ) 0.125 MG TBDP disintergrating tablet Place 1 tablet (0.125 mg total) under the tongue every 6 (six) hours as  needed. 120 tablet 11   olmesartan  (BENICAR ) 40 MG tablet Take 1 tablet (40 mg total) by mouth daily. 90 tablet 3   No current facility-administered medications for this visit.    Allergies as of 09/07/2024 - Review Complete 09/07/2024  Allergen Reaction Noted   Statins  11/20/2022    Family History  Problem Relation Age of Onset   Parkinson's disease Mother    Dementia Mother    Lung cancer Father 52   Breast cancer Sister 61   Multiple sclerosis Sister    Prostate cancer Maternal Uncle 55   Stomach cancer Maternal Uncle    Cancer Maternal Uncle 66  GI cancer - ? stomach   Cancer Cousin 50       mat female first cousin (son of mat uncle with GI cancer) with ureter cancer   Colon cancer Neg Hx    Esophageal cancer Neg Hx    Rectal cancer Neg Hx    Colon polyps Neg Hx    Sleep apnea Neg Hx     Social History   Socioeconomic History   Marital status: Widowed    Spouse name: Not on file   Number of children: 4   Years of education: Not on file   Highest education level: Not on file  Occupational History   Occupation: retired  Tobacco Use   Smoking status: Never   Smokeless tobacco: Never  Vaping Use   Vaping status: Never Used  Substance and Sexual Activity   Alcohol  use: Yes    Comment: occasionally  wine/beer   Drug use: No   Sexual activity: Never    Birth control/protection: Surgical    Comment: Vaginal hysterectomy  Other Topics Concern   Not on file  Social History Narrative   Not on file   Social Drivers of Health   Financial Resource Strain: Not on file  Food Insecurity: Not on file  Transportation Needs: Not on file  Physical Activity: Not on file  Stress: Not on file  Social Connections: Not on file  Intimate Partner Violence: Not on file    Review of Systems:    Constitutional: No weight loss, fever, chills, weakness or fatigue HEENT: Eyes: No change in vision               Ears, Nose, Throat:  No change in hearing or  congestion Skin: No rash or itching Cardiovascular: No chest pain, chest pressure or palpitations   Respiratory: No SOB or cough Gastrointestinal: See HPI and otherwise negative Genitourinary: No dysuria or change in urinary frequency Neurological: No headache, dizziness or syncope Musculoskeletal: No new muscle or joint pain Hematologic: No bleeding or bruising Psychiatric: No history of depression or anxiety    Physical Exam:  Vital signs: BP 110/70   Pulse 70   Ht 4' 11 (1.499 m)   Wt 150 lb (68 kg)   LMP 10/20/1996   BMI 30.30 kg/m   Constitutional: NAD, alert and cooperative appears significantly younger than stated age Head:  Normocephalic and atraumatic. Eyes:   PEERL, EOMI. No icterus. Conjunctiva pink. Respiratory: Respirations even and unlabored. Lungs clear to auscultation bilaterally.   No wheezes, crackles, or rhonchi.  Cardiovascular:  Regular rate and rhythm. No peripheral edema, cyanosis or pallor.  Gastrointestinal:  Soft, nondistended, nontender. No rebound or guarding. Normal bowel sounds. No appreciable masses or hepatomegaly. Rectal:  Declines Msk:  Symmetrical without gross deformities. Without edema, no deformity or joint abnormality.  Neurologic:  Alert and  oriented x4;  grossly normal neurologically.  Skin:   Dry and intact without significant lesions or rashes. Psychiatric: Oriented to person, place and time. Demonstrates good judgement and reason without abnormal affect or behaviors.   RELEVANT LABS AND IMAGING: CBC    Component Value Date/Time   WBC 11.8 (H) 05/13/2024 1437   RBC 4.86 05/13/2024 1437   HGB 14.3 05/13/2024 1437   HGB 15.7 11/18/2017 0950   HGB 15.4 11/14/2016 1021   HCT 43.5 05/13/2024 1437   HCT 45.7 11/14/2016 1021   PLT 352 05/13/2024 1437   PLT 309 11/18/2017 0950   PLT 262 11/14/2016 1021   MCV 89.5  05/13/2024 1437   MCV 89.1 11/14/2016 1021   MCH 29.4 05/13/2024 1437   MCHC 32.9 05/13/2024 1437   RDW 13.0  05/13/2024 1437   RDW 13.2 11/14/2016 1021   LYMPHSABS 1.4 04/11/2024 1337   LYMPHSABS 1.3 11/14/2016 1021   MONOABS 0.9 04/11/2024 1337   MONOABS 0.8 11/14/2016 1021   EOSABS 0.3 04/11/2024 1337   EOSABS 0.2 11/14/2016 1021   BASOSABS 0.1 04/11/2024 1337   BASOSABS 0.1 11/14/2016 1021    CMP     Component Value Date/Time   NA 140 05/13/2024 1437   NA 140 08/19/2023 1208   NA 139 11/14/2016 1021   K 4.3 05/13/2024 1437   K 4.8 11/14/2016 1021   CL 104 05/13/2024 1437   CO2 21 (L) 05/13/2024 1437   CO2 29 11/14/2016 1021   GLUCOSE 109 (H) 05/13/2024 1437   GLUCOSE 103 11/14/2016 1021   BUN 19 05/13/2024 1437   BUN 16 08/19/2023 1208   BUN 20.4 11/14/2016 1021   CREATININE 1.18 (H) 05/13/2024 1437   CREATININE 1.41 (H) 11/18/2017 0950   CREATININE 1.3 (H) 11/14/2016 1021   CALCIUM  9.9 05/13/2024 1437   CALCIUM  11.5 (H) 11/14/2016 1021   PROT 7.2 05/13/2024 1437   PROT 7.2 11/14/2016 1021   ALBUMIN 4.4 05/13/2024 1437   ALBUMIN 4.0 11/14/2016 1021   AST 22 05/13/2024 1437   AST 18 11/18/2017 0950   AST 17 11/14/2016 1021   ALT 12 05/13/2024 1437   ALT 13 11/18/2017 0950   ALT 13 11/14/2016 1021   ALKPHOS 81 05/13/2024 1437   ALKPHOS 96 11/14/2016 1021   BILITOT 0.7 05/13/2024 1437   BILITOT 0.7 11/18/2017 0950   BILITOT 1.25 (H) 11/14/2016 1021   GFRNONAA 44 (L) 05/13/2024 1437   GFRNONAA 34 (L) 11/18/2017 0950   GFRAA 49 (L) 12/06/2019 1545   GFRAA 40 (L) 11/18/2017 0950     Assessment/Plan:   IBS-D On combination of Bentyl , Levsin , and Metamucil with adequate control going months without episodes.  Mainly uses Levsin  on an as needed basis.  Recent episode last week secondary to stressful situation which has since resolved - Refill Bentyl  and Levsin  for 1 year -- continue metamucil -- follow up sooner if recurrent symptoms  Colon cancer screening Colonoscopy 06/2016 with diverticulosis, internal hemorrhoids, otherwise normal  Payden Docter [facility]oll[facility] 09/07/2024, 11:09 AM  Cc: Waylan Almarie SAUNDERS, MD

## 2024-09-07 NOTE — Patient Instructions (Signed)
 _______________________________________________________  If your blood pressure at your visit was 140/90 or greater, please contact your primary care physician to follow up on this.  _______________________________________________________  If you are age 87 or older, your body mass index should be between 23-30. Your Body mass index is 30.3 kg/m. If this is out of the aforementioned range listed, please consider follow up with your Primary Care Provider.  If you are age 58 or younger, your body mass index should be between 19-25. Your Body mass index is 30.3 kg/m. If this is out of the aformentioned range listed, please consider follow up with your Primary Care Provider.   ________________________________________________________  The [facility] providers would like to encourage you to use MYCHART to communicate with providers for non-urgent requests or questions.  Due to long hold times on the telephone, sending your provider a message by [facility] may be a faster and more efficient way to get a response.  Please allow 48 business hours for a response.  Please remember that this is for non-urgent requests.  _______________________________________________________  [facility] is using a team-based approach to care.  Your team is made up of your doctor and two to three APPS. Our APPS (Nurse Practitioners and Physician Assistants) work with your physician to ensure care continuity for you. They are fully qualified to address your health concerns and develop a treatment plan. They communicate directly with your gastroenterologist to care for you. Seeing the Advanced Practice Practitioners on your physician's team can help you by facilitating care more promptly, often allowing for earlier appointments, access to diagnostic testing, procedures, and other specialty referrals.   We have sent the following medications to your pharmacy for you to pick up at your convenience:   Please follow up  in 12 months. Give us  a call at [phone] to schedule an appointment.  It was a pleasure to see you today!  Thank you for trusting me with your gastrointestinal care!

## 2024-09-08 ENCOUNTER — Encounter: Payer: Self-pay | Admitting: Cardiology

## 2024-09-13 ENCOUNTER — Other Ambulatory Visit: Payer: Self-pay | Admitting: Cardiology

## 2024-09-19 ENCOUNTER — Other Ambulatory Visit: Payer: Self-pay

## 2024-09-19 MED ORDER — SPIRONOLACTONE 25 MG PO TABS: 12.5000 mg | ORAL_TABLET | Freq: Every day | ORAL | 0 refills | Status: DC

## 2024-09-19 MED ORDER — ROSUVASTATIN CALCIUM 10 MG PO TABS: 10.0000 mg | ORAL_TABLET | ORAL | 0 refills | Status: AC

## 2024-11-02 ENCOUNTER — Other Ambulatory Visit: Payer: Self-pay | Admitting: Physician Assistant

## 2024-11-02 NOTE — Telephone Encounter (Signed)
 In accordance with refill protocols, please review and address the following requirements before this medication refill can be authorized:  Labs

## 2024-11-02 NOTE — Progress Notes (Signed)
 "  Office Visit    Patient Name: Carla Bennett Date of Encounter: 11/03/2024  PCP:  Waylan Almarie SAUNDERS, MD   [facility]  Cardiologist:  Oneil Parchment, MD  Advanced Practice Provider:  No care team member to display Electrophysiologist:  None   HPI    Carla Bennett is a 89 y.o. female with a past medical history of hypertension, CHF, prior chemotherapy for breast cancer, pericardial effusion presents today for follow-up for hypotension.  On her echocardiogram originally, her ejection fraction was markedly reduced at 20 to 25%.  She had been treated with Adriamycin .  She also showed moderate pericardial effusion.  She had an echocardiogram performed 03/13/2015 which at that time showed no evidence of pericardial effusion.  Mild mitral and aortic regurgitation noted.  Normal ejection fraction.  This was prechemotherapy for breast cancer.  She had a moderately sized pericardial effusion viewed on CT scan 01/2016.  Aortic atherosclerosis with mild coronary artery atherosclerosis was also noted.  Pleural effusions were also noted.  Subsequent follow-up with an echo 1 month later showed ejection fraction 35%.  Improved.  Effusion was trace.  She was still having some GI issues for several years and ended up having a diet where she eliminated many fruits.  She was following Dr. Abran.  Spironolactone  was previously stopped due to hypotension and lethargy but was eventually restarted.  She was last seen 02/2022 and noted that her blood pressure was low.  It was 116/60 in the clinic.  She states that she feels best when her blood pressure is 130 systolic.  When her BP is lower she feels like she is going to pass out.  Per her blood pressure log, there were a few low readings typically in the mid afternoons.  She notes this is when she usually feels the worst with no energy.  Sometimes she forgets to take her nighttime dose until reports she goes to bed instead of around 9  PM.  Lately she admits to not walking much for exercise.  She planned to work on this.  I saw her 07/18/2022, she provided me with an extensive log which involved blood pressures taken in the morning before medications, blood pressures taken midday, and blood pressures taken in the evening (unclear if it was before or after medications).  Blood pressures in the morning and evening were generally high and then midday was dropping low and this is when the patient feels the worst.  Lowest blood pressure noted midday was 99/53 with a pulse rate of 63.  She is not fluid overloaded today so we have changed her Lasix  to as needed and continued her on her spironolactone  which she takes at night.  She also tells me that when she is in pain her blood pressure spikes up which is a normal response.  We encouraged her to stay hydrated throughout the day.  We also ordered an echocardiogram since the patient was concerned about an episode of passing out.  She takes most of her blood pressure pills at night but is still taking Coreg  12.5 mg.  We discussed taking blood pressure an hour after morning medicines and an hour after evening medicines and keeping track of those values.  I saw her last 07/2023,  she feels pretty good today.  No cardiac complaints.  We reviewed her most recent testing including echocardiogram, monitor, carotid artery ultrasound.  Questions were answered.  She has been doing well on her current medication regimen.  Her primary care recently changed her lisinopril  to 20 mg in the morning and 20 mg in the evening to help stabilize her blood pressures.  She brings in a log today and it seems that she takes her blood pressure before medications in the morning it is typically high then she will take it at lunchtime and it drops down much lower close to 100 systolic and then by the evening time it will be back up to 150s/160s systolic and this is typically after her evening dose of lisinopril .  She changed her  Lasix  to as needed and it seems like the lunchtime blood pressure reading now is not quite as low and she feels pretty good during that time.  However, we did discuss changing her lisinopril  to valsartan or olmesartan  and she was hesitant because she had been on lisinopril  for so long.  She wanted me to discuss this with Dr. Jeffrie and her kidney doctor before making the change.  Reports no shortness of breath nor dyspnea on exertion. Reports no chest pain, pressure, or tightness. No edema, orthopnea, PND. Reports no palpitations.   Today, she has a hx of hypertension and congestive heart failure who presents with erratic blood pressure readings.  She reports morning systolic blood pressures of 170 to 190 over 80, falling to about 109 to 111 systolic after breakfast and medications, then about 150 systolic at bedtime. She is concerned about these fluctuations. When her systolic pressure is around 110, she has dizziness and feels unable to function for 2 to 3 hours.  She is taking carvedilol  twice daily, lisinopril  at night, and furosemide  and spironolactone  in the morning. She previously tried olmesartan  (Benicar ) but stopped it due to muscle weakness and soreness. She has tolerated long-term lisinopril  without cough.  She recalls a past syncope episode she relates to taking an antibiotic on an empty stomach, with no recurrence. With congestive heart failure she is worried that exercise might strain her heart. She is unsure if her fatigue is due to deconditioning or age and is afraid to exercise alone because of fear of falling or syncope. She reports no peripheral edema.  She has had irregular heartbeats for years and was evaluated in the emergency room for palpitations, where an EKG showed premature atrial contractions. She wore a heart monitor for two weeks in June that showed rare PACs. She reports no recent lightheadedness or dizziness related to her heart rhythm.  Reports no shortness of breath  nor dyspnea on exertion. Reports no chest pain, pressure, or tightness. No edema, orthopnea, PND. Reports no palpitations.   Discussed the use of AI scribe software for clinical note transcription with the patient, who gave verbal consent to proceed.   Past Medical History    Past Medical History:  Diagnosis Date   Allergy    Aortic regurgitation    Arthritis    hands, neck   Basal cell carcinoma    Benign neoplasm of colon    Breast cancer of upper-outer quadrant of left female breast (HCC) 02/23/2015   4'16-left breast cancer-surgery, chemo, radiation-[name] follows every 3 months   Cataract    beginning stages-monitoring by MD   Cholelithiasis    resolved with surgery   Chronic systolic CHF (congestive heart failure) (HCC) 02/26/2016   A.  Echo 02/21/16:  Mild LVH, EF 20-25%, diff HK, Gr 3 DD, mod AI, mod to severe MR, mod LAE, mod redcued RVSF, mild RAE, mod TR, PASP 53 mmHg, small pericardial effusion, mod L  pleural effusion  //  B. Echo 6/17:  Mild LVH EF 30-35%, diffuse HK, grade 1 diastolic dysfunction, trivial AI, MAC, mild LAE, normal RVSF, trivial pericardial effusion     Cystocele    Diverticulitis    Diverticulosis of colon (without mention of hemorrhage)    Gout    resolved -   Hearing loss    some hearing loss in right ear- no hearing aids   Heart murmur    Hematuria    negative urology work up   History of blood transfusion 1971   x 1 with D&C -    Hx of abnormal cervical Pap smear    LEEP-CIN I, negative margins and ECC   Hypercalcemia    Hypertension    Mitral regurgitation    Neuromuscular disorder (HCC)    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   OA (osteoarthritis) of hip    right   Other issue of medical certificates    uterine myomata   Parathyroid  abnormality    being followed Dr. DOROTHA Alexander   Radiation 10/01/15-11/16/15   left breast axillary and supraclavicular reigion 45 gray, left breast 50.4 gray, lumpectomy cavity boost 10 gray   Renal disease 3/16    Stage 3 kidney disease-seeing Dr Alexander and Dr Rayburn   Uterine prolaps    resolved after surgery   Vitamin D  deficiency    Past Surgical History:  Procedure Laterality Date   BREAST LUMPECTOMY Left 08/2015   CHOLECYSTECTOMY     COLONOSCOPY  01/18/2013   [name]   COLONOSCOPY N/A 06/26/2016   Procedure: COLONOSCOPY;  Surgeon: Norleen LOISE Kiang, MD;  Location: [facility];  Service: Endoscopy;  Laterality: N/A;   CYSTOCELE REPAIR  08/2010   Rectocele, vault prolapse   DILATION AND CURETTAGE OF UTERUS     x2   PORT-A-CATH REMOVAL Right 08/23/2015   Procedure: REMOVAL PORT-A-CATH;  Surgeon: Debby Shipper, MD;  Location: [facility];  Service: General;  Laterality: Right;   PORTACATH PLACEMENT Right 03/20/2015   Procedure: INSERTION PORT-A-CATH;  Surgeon: Debby Shipper, MD;  Location: [facility] OR;  Service: General;  Laterality: Right;   RADIOACTIVE SEED GUIDED PARTIAL MASTECTOMY WITH AXILLARY SENTINEL LYMPH NODE BIOPSY Left 08/23/2015   Procedure: LEFT BREAST PARTIAL MASTECTOMY WITH SENTINEL LYMPH NODE MAPPING;  Surgeon: Debby Shipper, MD;  Location: [facility];  Service: General;  Laterality: Left;   TONSILLECTOMY AND ADENOIDECTOMY     TOTAL VAGINAL HYSTERECTOMY     secondary to prolapse, ovaries not removed    Allergies  Allergies  Allergen Reactions   Ciprofloxacin Nausea And Vomiting    Dizziness and syncope   Statins     Muscle aches     EKGs/Labs/Other Studies Reviewed:   The following studies were reviewed today:   CT Chest 12/18/2020: FINDINGS: Cardiovascular: No significant vascular findings. Normal heart size. No pericardial effusion.   Mediastinum/Nodes: No axillary supraclavicular adenopathy. No mediastinal adenopathy. Trachea and esophagus are normal. Small hiatal hernia.   Lungs/Pleura: No pneumothorax or pulmonary contusion. No pleural fluid. No aspiration. Mild RIGHT lower lobe bronchial thickening.   Upper Abdomen: Limited view  of the liver, kidneys, pancreas are unremarkable. Normal adrenal glands.   Musculoskeletal: No rib fracture.  No spine fracture   IMPRESSION: 1. No evidence of thoracic trauma. 2. Hiatal hernia. 3. Mild bronchitis in the RIGHT lower lobe   Echo TTE 01/21/2017: - Left ventricle: The cavity size was normal. Wall thickness was    normal.  Systolic function was mildly reduced. The estimated    ejection fraction was in the range of 45% to 50%. Inferolateral    hypokinesis. Abnormal GLPSS at -14% wtih inferior strain    abnormality. Doppler parameters are consistent with abnormal left    ventricular relaxation (grade 1 diastolic dysfunction). The E/e&'    ratio is >15, suggesting elevated LV filling pressure.  - Aortic valve: Trileaflet. Sclerosis without stenosis. There was    trivial regurgitation.  - Left atrium: The atrium was normal in size.  - Inferior vena cava: The vessel was normal in size. The    respirophasic diameter changes were in the normal range (>= 50%),    consistent with normal central venous pressure.   Impressions:  - Compared to a prior study in 2017, the LVEF has improved to    45-50% - there is inferior and inferolateral hypokinesis.   EKG:  EKG is  ordered today.  The ekg ordered today demonstrates normal sinus rhythm, rate 70 bpm.  Recent Labs: 05/13/2024: ALT 12; BUN 19; Creatinine, Ser 1.18; Hemoglobin 14.3; Platelets 352; Potassium 4.3; Sodium 140  Recent Lipid Panel    Component Value Date/Time   CHOL 113 08/29/2022 0558   CHOL 140 11/29/2020 1004   TRIG 74 08/29/2022 0558   HDL 44 08/29/2022 0558   HDL 54 11/29/2020 1004   CHOLHDL 2.6 08/29/2022 0558   VLDL 15 08/29/2022 0558   LDLCALC 54 08/29/2022 0558   LDLCALC 64 11/29/2020 1004     Home Medications   Current Meds  Medication Sig   aspirin  EC 81 MG tablet Take 1 tablet (81 mg total) by mouth daily. Swallow whole.   B Complex Vitamins (VITAMIN B COMPLEX PO) Take 1 tablet by mouth daily.     calcium  carbonate (TUMS) 500 MG chewable tablet Chew 2 tablets (400 mg of elemental calcium  total) by mouth 3 times/day as needed-between meals & bedtime for indigestion or heartburn.   carvedilol  (COREG ) 25 MG tablet Take 25 mg by mouth 2 (two) times daily with a meal.   dicyclomine  (BENTYL ) 10 MG capsule Take 1 capsule (10 mg total) by mouth daily.   furosemide  (LASIX ) 20 MG tablet Take 20 mg by mouth as needed for fluid or edema.   hyoscyamine  (ANASPAZ ) 0.125 MG TBDP disintergrating tablet Place 1 tablet (0.125 mg total) under the tongue every 6 (six) hours as needed.   lisinopril  (ZESTRIL ) 40 MG tablet Take 40 mg by mouth daily.   loratadine (CLARITIN) 10 MG tablet Take 10 mg by mouth daily.   rosuvastatin  (CRESTOR ) 10 MG tablet Take 1 tablet (10 mg total) by mouth 2 (two) times a week.   spironolactone  (ALDACTONE ) 25 MG tablet Take 0.5 tablets (12.5 mg total) by mouth daily.     Review of Systems      All other systems reviewed and are otherwise negative except as noted above.  Physical Exam    VS:  BP 134/84   Pulse 84   Ht 4' 11.75 (1.518 m)   Wt 152 lb (68.9 kg)   LMP 10/20/1996   SpO2 98%   BMI 29.93 kg/m  , BMI Body mass index is 29.93 kg/m.  Wt Readings from Last 3 Encounters:  11/03/24 152 lb (68.9 kg)  09/07/24 150 lb (68 kg)  05/13/24 150 lb 6.4 oz (68.2 kg)     GEN: Well nourished, well developed, in no acute distress. HEENT: normal. Neck: Supple, no JVD, carotid bruits, or masses. Cardiac: RRR,  no murmurs, rubs, or gallops. No clubbing, cyanosis, edema.  Radials/PT 2+ and equal bilaterally.  Respiratory:  Respirations regular and unlabored, clear to auscultation bilaterally. GI: Soft, nontender, nondistended. MS: No deformity or atrophy. Skin: Warm and dry, no rash. Neuro:  Strength and sensation are intact. Psych: Normal affect.  Assessment & Plan    Chronic systolic heart failure Discussed benefits of gradual exercise to improve cardiac efficiency and  reduce symptoms. - Encouraged gradual increase in physical activity to improve cardiac efficiency.  Essential hypertension Suspected additive effect of carvedilol  and furosemide  causing midday hypotension. - Adjusted medication timing: Take carvedilol  in the morning and furosemide  at lunchtime to prevent midday hypotension.  Chronic kidney disease stage 4 No recent nephrology follow-up due to nephrologist's relocation.  Cardiac arrhythmia (PACs vs. atrial fibrillation) Irregular heart rate with previous EKG showing PACs. Discussed potential for AFib and associated stroke risk. Prefers to avoid anticoagulation therapy. - EKG today shows frequent PACs   Disposition: Follow up 6 months with Oneil Parchment, MD or APP.  Signed, Orren LOISE Fabry, PA-C 11/03/2024, 4:47 PM [facility]  "

## 2024-11-03 ENCOUNTER — Encounter: Payer: Self-pay | Admitting: Physician Assistant

## 2024-11-03 ENCOUNTER — Ambulatory Visit: Attending: Physician Assistant | Admitting: Physician Assistant

## 2024-11-03 VITALS — BP 134/84 | HR 84 | Ht 59.75 in | Wt 152.0 lb

## 2024-11-03 DIAGNOSIS — R42 Dizziness and giddiness: Secondary | ICD-10-CM | POA: Diagnosis not present

## 2024-11-03 DIAGNOSIS — R55 Syncope and collapse: Secondary | ICD-10-CM | POA: Insufficient documentation

## 2024-11-03 DIAGNOSIS — I10 Essential (primary) hypertension: Secondary | ICD-10-CM | POA: Diagnosis not present

## 2024-11-03 DIAGNOSIS — I5022 Chronic systolic (congestive) heart failure: Secondary | ICD-10-CM | POA: Diagnosis not present

## 2024-11-03 DIAGNOSIS — N184 Chronic kidney disease, stage 4 (severe): Secondary | ICD-10-CM | POA: Diagnosis not present

## 2024-11-03 NOTE — Patient Instructions (Signed)
 Medication Instructions:  Please remember to take your Lasix  (furosemide ) midday a couple of hours after taking Coreg  (carvedilol ) *If you need a refill on your cardiac medications before your next appointment, please call your pharmacy*  Lab Work: None ordered If you have labs (blood work) drawn today and your tests are completely normal, you will receive your results only by: MyChart Message (if you have MyChart) OR A paper copy in the mail If you have any lab test that is abnormal or we need to change your treatment, we will call you to review the results.  Testing/Procedures: None ordered  Follow-Up: At [facility], you and your health needs are our priority.  As part of our continuing mission to provide you with exceptional heart care, our providers are all part of one team.  This team includes your primary Cardiologist (physician) and Advanced Practice Providers or APPs (Physician Assistants and Nurse Practitioners) who all work together to provide you with the care you need, when you need it.  Your next appointment:   6 month(s)  Provider:   Oneil Parchment, MD    We recommend signing up for the patient portal called MyChart.  Sign up information is provided on this After Visit Summary.  MyChart is used to connect with patients for Virtual Visits (Telemedicine).  Patients are able to view lab/test results, encounter notes, upcoming appointments, etc.  Non-urgent messages can be sent to your provider as well.   To learn more about what you can do with MyChart, go to [email or web address].   Other Instructions

## 2024-12-20 ENCOUNTER — Ambulatory Visit: Admitting: Hematology and Oncology
